# Patient Record
Sex: Male | Born: 1945
Health system: Southern US, Community
[De-identification: ages and names within clinical notes are randomized; demographics above are authoritative.]

## PROBLEM LIST (undated history)

## (undated) DIAGNOSIS — E119 Type 2 diabetes mellitus without complications: Secondary | ICD-10-CM

## (undated) DIAGNOSIS — E785 Hyperlipidemia, unspecified: Secondary | ICD-10-CM

## (undated) DIAGNOSIS — I10 Essential (primary) hypertension: Secondary | ICD-10-CM

## (undated) DIAGNOSIS — H3530 Unspecified macular degeneration: Secondary | ICD-10-CM

## (undated) DIAGNOSIS — I2510 Atherosclerotic heart disease of native coronary artery without angina pectoris: Secondary | ICD-10-CM

## (undated) DIAGNOSIS — M1990 Unspecified osteoarthritis, unspecified site: Secondary | ICD-10-CM

## (undated) DIAGNOSIS — Z87442 Personal history of urinary calculi: Secondary | ICD-10-CM

## (undated) DIAGNOSIS — K635 Polyp of colon: Secondary | ICD-10-CM

## (undated) DIAGNOSIS — N200 Calculus of kidney: Secondary | ICD-10-CM

## (undated) DIAGNOSIS — K5790 Diverticulosis of intestine, part unspecified, without perforation or abscess without bleeding: Secondary | ICD-10-CM

## (undated) HISTORY — PX: CORONARY ANGIOPLASTY WITH STENT PLACEMENT: SHX49

## (undated) HISTORY — DX: Calculus of kidney: N20.0

## (undated) HISTORY — DX: Diverticulosis of intestine, part unspecified, without perforation or abscess without bleeding: K57.90

## (undated) HISTORY — DX: Polyp of colon: K63.5

## (undated) HISTORY — DX: Essential (primary) hypertension: I10

## (undated) HISTORY — DX: Atherosclerotic heart disease of native coronary artery without angina pectoris: I25.10

## (undated) HISTORY — DX: Unspecified osteoarthritis, unspecified site: M19.90

## (undated) HISTORY — DX: Unspecified macular degeneration: H35.30

## (undated) HISTORY — DX: Hyperlipidemia, unspecified: E78.5

---

## 1998-10-18 ENCOUNTER — Other Ambulatory Visit: Admission: RE | Admit: 1998-10-18 | Discharge: 1998-10-18 | Payer: Self-pay | Admitting: Gastroenterology

## 2004-02-22 ENCOUNTER — Ambulatory Visit (HOSPITAL_COMMUNITY): Admission: AD | Admit: 2004-02-22 | Discharge: 2004-02-22 | Payer: Self-pay | Admitting: Urology

## 2004-03-21 ENCOUNTER — Encounter: Payer: Self-pay | Admitting: Gastroenterology

## 2005-06-19 ENCOUNTER — Inpatient Hospital Stay (HOSPITAL_BASED_OUTPATIENT_CLINIC_OR_DEPARTMENT_OTHER): Admission: RE | Admit: 2005-06-19 | Discharge: 2005-06-19 | Payer: Self-pay | Admitting: Cardiovascular Disease

## 2005-06-23 ENCOUNTER — Observation Stay (HOSPITAL_COMMUNITY): Admission: RE | Admit: 2005-06-23 | Discharge: 2005-06-24 | Payer: Self-pay | Admitting: Cardiovascular Disease

## 2009-02-16 ENCOUNTER — Encounter (INDEPENDENT_AMBULATORY_CARE_PROVIDER_SITE_OTHER): Payer: Self-pay | Admitting: *Deleted

## 2009-11-25 ENCOUNTER — Encounter (INDEPENDENT_AMBULATORY_CARE_PROVIDER_SITE_OTHER): Payer: Self-pay | Admitting: *Deleted

## 2009-12-21 ENCOUNTER — Encounter (INDEPENDENT_AMBULATORY_CARE_PROVIDER_SITE_OTHER): Payer: Self-pay | Admitting: *Deleted

## 2009-12-21 ENCOUNTER — Ambulatory Visit: Payer: Self-pay | Admitting: Gastroenterology

## 2009-12-21 DIAGNOSIS — E119 Type 2 diabetes mellitus without complications: Secondary | ICD-10-CM | POA: Insufficient documentation

## 2009-12-21 DIAGNOSIS — Z860100 Personal history of colon polyps, unspecified: Secondary | ICD-10-CM | POA: Insufficient documentation

## 2009-12-21 DIAGNOSIS — I2510 Atherosclerotic heart disease of native coronary artery without angina pectoris: Secondary | ICD-10-CM

## 2009-12-21 HISTORY — DX: Atherosclerotic heart disease of native coronary artery without angina pectoris: I25.10

## 2010-01-17 ENCOUNTER — Ambulatory Visit: Payer: Self-pay | Admitting: Gastroenterology

## 2010-01-20 ENCOUNTER — Encounter: Payer: Self-pay | Admitting: Gastroenterology

## 2010-01-26 ENCOUNTER — Observation Stay (HOSPITAL_COMMUNITY): Admission: AD | Admit: 2010-01-26 | Discharge: 2010-01-27 | Payer: Self-pay | Admitting: Internal Medicine

## 2010-01-26 ENCOUNTER — Telehealth: Payer: Self-pay | Admitting: Gastroenterology

## 2010-01-26 ENCOUNTER — Ambulatory Visit: Payer: Self-pay | Admitting: Gastroenterology

## 2010-01-26 DIAGNOSIS — K625 Hemorrhage of anus and rectum: Secondary | ICD-10-CM | POA: Insufficient documentation

## 2010-01-26 DIAGNOSIS — I2510 Atherosclerotic heart disease of native coronary artery without angina pectoris: Secondary | ICD-10-CM | POA: Insufficient documentation

## 2010-01-26 DIAGNOSIS — I10 Essential (primary) hypertension: Secondary | ICD-10-CM | POA: Insufficient documentation

## 2010-01-26 DIAGNOSIS — E785 Hyperlipidemia, unspecified: Secondary | ICD-10-CM | POA: Insufficient documentation

## 2010-01-26 LAB — CONVERTED CEMR LAB
Basophils Absolute: 0 10*3/uL (ref 0.0–0.1)
Eosinophils Absolute: 0.2 10*3/uL (ref 0.0–0.7)
HCT: 38.4 % — ABNORMAL LOW (ref 39.0–52.0)
Lymphs Abs: 2.2 10*3/uL (ref 0.7–4.0)
MCHC: 32.2 g/dL (ref 30.0–36.0)
Monocytes Absolute: 0.6 10*3/uL (ref 0.1–1.0)
Monocytes Relative: 5.3 % (ref 3.0–12.0)
Neutro Abs: 7.9 10*3/uL — ABNORMAL HIGH (ref 1.4–7.7)
Platelets: 242 10*3/uL (ref 150.0–400.0)
RDW: 12.6 % (ref 11.5–14.6)

## 2010-02-03 ENCOUNTER — Ambulatory Visit: Payer: Self-pay | Admitting: Gastroenterology

## 2010-02-03 LAB — CONVERTED CEMR LAB
Basophils Absolute: 0.1 10*3/uL (ref 0.0–0.1)
Eosinophils Relative: 1.5 % (ref 0.0–5.0)
HCT: 36.2 % — ABNORMAL LOW (ref 39.0–52.0)
Lymphocytes Relative: 24.4 % (ref 12.0–46.0)
Monocytes Relative: 7.9 % (ref 3.0–12.0)
Neutrophils Relative %: 65.5 % (ref 43.0–77.0)
Platelets: 262 10*3/uL (ref 150.0–400.0)
RDW: 12.6 % (ref 11.5–14.6)
WBC: 8.6 10*3/uL (ref 4.5–10.5)

## 2010-12-13 NOTE — Progress Notes (Signed)
Summary: BRB this morning  Phone Note Call from Patient Call back at [phone]   Caller: Patient Summary of Call: patient seen BRB in his stool this morning. No other complaints. No abd.  Initial call taken by: Harlow Mares CMA Duncan Dull),  January 26, 2010 10:37 AM  Follow-up for Phone Call        Pt states that he had bright red blood with stool this am.  Stool was normal although he had a sudden urge and could barely make it to the bathroom.  States has a large stool.  Pt went back to bath room an hr or so later and he noticed more bright blood although he did not have a bm.  Pt had a colonoscopy 9 days ago.  Pt is on Plavix which he restarted to day after colon.  (see report) Follow-up by: Ashok Cordia RN,  January 26, 2010 11:10 AM  Additional Follow-up for Phone Call Additional follow up Details #1::        Discussed pt with Amy Esterwood PA, per Amy, if no more bleeding today OK to just observe for now.  If cont's to pass blood will need CBC and OV tomorrow.  If bleeding becomes severe will need to go to ER.    Additional Follow-up by: Ashok Cordia RN,  January 26, 2010 2:53 PM    Additional Follow-up for Phone Call Additional follow up Details #2::    Talked with pt.  States he has passed blood twice since this am.  He describes it as alot of blood.  Both dark and bright red the second time.  Pt feels fine otherwise.  He has taken his plavix today. Follow-up by: Ashok Cordia RN,  January 26, 2010 2:56 PM  Additional Follow-up for Phone Call Additional follow up Details #3:: Details for Additional Follow-up Action Taken: Per Amy, pt need CBC and OV this pm.  Pt notified.  Coming now. Additional Follow-up by: Ashok Cordia RN,  January 26, 2010 3:18 PM

## 2010-12-13 NOTE — Procedures (Signed)
Summary: Colonoscopy  Patient: [facility] Note: All result statuses are Final unless otherwise noted.  Tests: (1) Colonoscopy (COL)   COL Colonoscopy           DONE     [facility]     [address]     [place], [place]  [address]           COLONOSCOPY PROCEDURE REPORT           PATIENT:  Jason G[facility], Jason Giles  MR#:  [id number]     BIRTHDATE:  [birth year], 63 yrs. old  GENDER:  male           ENDOSCOPIST:  Vania Rea. Jarold Motto, MD, [facility]     Referred by:           PROCEDURE DATE:  01/17/2010     PROCEDURE:  Colonoscopy with snare polypectomy     ASA CLASS:  Class III     INDICATIONS:  Routine Risk Screening           MEDICATIONS:   Fentanyl 50 mcg IV, Versed 7 mg IV           DESCRIPTION OF PROCEDURE:   After the risks benefits and     alternatives of the procedure were thoroughly explained, informed     consent was obtained.  Digital rectal exam was performed and     revealed no abnormalities.   The LB [id number] endoscope     was introduced through the anus and advanced to the cecum, which     was identified by both the appendix and ileocecal valve, limited     by a redundant colon.  VERY REDUNDANT RIGHT COLON.  The quality of     the prep was adequate, using MiraLax.  The instrument was then     slowly withdrawn as the colon was fully examined.     <<PROCEDUREIMAGES>>           FINDINGS:  ULTRASONIC FINDINGS:  A sessile polyp was found in the     cecum. CM. POLYP.BASE OF CECUM. Polyp was snared, then cauterized     with monopolar cautery. Retrieval was successful. snare polyp Mild     diverticulosis was found sigmoid to descending  This was otherwise     a normal examination of the colon. VERY REDUNDANT COLON FULL OF     PREP FLUID.   Retroflexed views in the rectum revealed no     abnormalities.    The scope was then withdrawn from the patient     and the procedure completed.           COMPLICATIONS:  None           ENDOSCOPIC IMPRESSION:     1) Sessile  polyp in the cecum     2) Mild diverticulosis in the sigmoid to descending     3) Otherwise normal examination     RECOMMENDATIONS:     1) If the polyp(s) removed today are proven to be adenomatous     (pre-cancerous) polyps, you will need a repeat colonoscopy in 5     years. Otherwise you should continue to follow colorectal cancer     screening guidelines for "routine risk" patients with colonoscopy     in 10 years.     HOLD PLAVIX FOR 1 WEEK.           REPEAT EXAM:  No           ______________________________  Vania Rea. Jarold Motto, MD, [facility]           CC:  Guerry Bruin, MDPhilip Nahser, MD           n.     Rosalie Doctor:   Vania Rea. Niajah Sipos at 01/17/2010 12:06 PM           Riki Sheer, [id number]  Note: An exclamation mark (!) indicates a result that was not dispersed into the flowsheet. Document Creation Date: 01/17/2010 12:07 PM _______________________________________________________________________  (1) Order result status: Final Collection or observation date-time: 01/17/2010 11:58 Requested date-time:  Receipt date-time:  Reported date-time:  Referring Physician:   Ordering Physician: Sheryn Bison [phone]) Specimen Source:  Source: Launa Grill Order Number: [id number] Lab site:   Appended Document: Colonoscopy     Procedures Next Due Date:    Colonoscopy: 01/2013

## 2010-12-13 NOTE — Assessment & Plan Note (Signed)
Summary: REC COL/PLAVIX.Marland KitchenMarland KitchenAS.   History of Present Illness Visit Type: Initial Visit Primary GI MD: Sheryn Bison MD FACP FAGA Primary Provider: Calton Golds, MD Chief Complaint: Colon screening, family hx History of Present Illness:   65 year old Caucasian male who is insulin-dependent who also takes aspirin and Plavix for previous coronary artery stenting. He has a family history of colon cancer his mother and he is due for his five-year colonoscopy exam denying any GI complaints at this time. He is on insulin, metformin, and Crestor. He is followed by Dr. Melburn Popper in cardiology and currently denies any pulmonary or cardiovascular problems.  Review of his chart does show one previous polyp removed. He currently is having regular bowel movements without melena, hematochezia, or any upper GI or hepatobiliary complaints. His appetite is good and his weight is stable. He relates a yearly Hemoccult cards have been guaiac-negative per his primary care physician Dr.Tisovec.   GI Review of Systems      Denies abdominal pain, acid reflux, belching, bloating, chest pain, dysphagia with liquids, dysphagia with solids, heartburn, loss of appetite, nausea, vomiting, vomiting blood, weight loss, and  weight gain.        Denies anal fissure, black tarry stools, change in bowel habit, constipation, diarrhea, diverticulosis, fecal incontinence, heme positive stool, hemorrhoids, irritable bowel syndrome, jaundice, light color stool, liver problems, rectal bleeding, and  rectal pain. Preventive Screening-Counseling & Management      Drug Use:  no.      Current Medications (verified): 1)  Novolog 100 Unit/ml Soln (Insulin Aspart) .... Inject 8-9 Units Two Times A Day 2)  Lantus 100 Unit/ml Soln (Insulin Glargine) .... Inject 45 Inits Two Times A Day 3)  Trilipix 135 Mg Cpdr (Choline Fenofibrate) .... Once Daily 4)  Toprol Xl 25 Mg Xr24h-Tab (Metoprolol Succinate) .... Take 2 Tablet By Mouth Daily 5)   Crestor 40 Mg Tabs (Rosuvastatin Calcium) .... Once Daily 6)  Metformin Hcl 850 Mg Tabs (Metformin Hcl) .... Take 1 Tablet By Mouth Two Times A Day 7)  Plavix 75 Mg Tabs (Clopidogrel Bisulfate) .... Once Daily 8)  Viagra 50 Mg Tabs (Sildenafil Citrate) .... As Needed 9)  Multivitamins  Tabs (Multiple Vitamin) .... Once Daily 10)  Aspir-Low 81 Mg Tbec (Aspirin) .... Once Daily  Allergies (verified): No Known Drug Allergies  Past History:  Past medical, surgical, family and social histories (including risk factors) reviewed for relevance to current acute and chronic problems.  Past Medical History: Diabetes Hyperlipidemia Arthritis Adenomatous Colon Polyps Coronary Artery Disease Kidney Stones  Past Surgical History: Knee Arthroscopy PTCA-Stent  Family History: Reviewed history from 12/20/2009 and no changes required. Family History of Colon Cancer: Mother Family History of COlon Polyps: Sister  Social History: Reviewed history from 12/20/2009 and no changes required. Patient has never smoked.  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  The patient denies allergy/sinus, anemia, anxiety-new, arthritis/joint pain, back pain, blood in urine, breast changes/lumps, change in vision, confusion, cough, coughing up blood, depression-new, fainting, fatigue, fever, headaches-new, heart murmur, heart rhythm changes, itching, menstrual pain, muscle pains/cramps, night sweats, nosebleeds, pregnancy symptoms, shortness of breath, skin rash, sleeping problems, sore throat, swelling of feet/legs, swollen lymph glands, thirst - excessive , urination - excessive , urination changes/pain, urine leakage, vision changes, and voice change.   General:  Denies fever, chills, sweats, anorexia,  fatigue, weakness, malaise, weight loss, and sleep disorder. Eyes:  Denies blurring,  diplopia, irritation, discharge, vision loss, scotoma, eye pain, and photophobia. ENT:  Complains of decreased hearing; denies earache, ear discharge, tinnitus, nasal congestion, loss of smell, nosebleeds, sore throat, hoarseness, and difficulty swallowing. CV:  Denies chest pains, angina, palpitations, syncope, dyspnea on exertion, orthopnea, PND, peripheral edema, and claudication. Resp:  Denies dyspnea at rest, dyspnea with exercise, cough, sputum, wheezing, coughing up blood, and pleurisy. GI:  Denies difficulty swallowing, pain on swallowing, nausea, indigestion/heartburn, vomiting, vomiting blood, abdominal pain, jaundice, gas/bloating, diarrhea, constipation, change in bowel habits, bloody BM's, black BMs, and fecal incontinence. GU:  Denies urinary burning, blood in urine, urinary frequency, urinary hesitancy, nocturnal urination, urinary incontinence, penile discharge, genital sores, decreased libido, and erectile dysfunction. MS:  Denies joint pain / LOM, joint swelling, joint stiffness, joint deformity, low back pain, muscle weakness, muscle cramps, muscle atrophy, leg pain at night, leg pain with exertion, and shoulder pain / LOM hand / wrist pain (CTS). Neuro:  Denies weakness, paralysis, abnormal sensation, seizures, syncope, tremors, vertigo, transient blindness, frequent falls, frequent headaches, difficulty walking, headache, sciatica, radiculopathy other:, restless legs, memory loss, and confusion. Psych:  Denies depression, anxiety, memory loss, suicidal ideation, hallucinations, paranoia, phobia, and confusion. Endo:  Denies cold intolerance, heat intolerance, polydipsia, polyphagia, polyuria, unusual weight change, and hirsutism; He denies any complications from his diabetes or any history of peripheral neuropathy, visual problems, or renal problems. He does have hyperlipidemia and is on Crestor and Trilipix.. Heme:  Denies bruising, bleeding, enlarged lymph nodes, and  pagophagia. Allergy:  Denies hives, rash, sneezing, hay fever, and recurrent infections.  Vital Signs:  Patient profile:   65 year old male Height:      71.5 inches Weight:      227.13 pounds BMI:     31.35 Pulse rate:   68 / minute Pulse rhythm:   irregular BP sitting:   126 / 72  (left arm) Cuff size:   regular  Vitals Entered By: June McMurray CMA Duncan Dull) (December 21, 2009 2:44 PM)  Physical Exam  General:  Well developed, well nourished, no acute distress.healthy appearing.   Head:  Normocephalic and atraumatic. Eyes:  PERRLA, no icterus.exam deferred to patient's ophthalmologist.   Neck:  Supple; no masses or thyromegaly. Lungs:  Clear throughout to auscultation. Heart:  Regular rate and rhythm; no murmurs, rubs,  or bruits. Abdomen:  Soft, nontender and nondistended. No masses, hepatosplenomegaly or hernias noted. Normal bowel sounds. Rectal:  deferred until time of colonoscopy.   Msk:  Symmetrical with no gross deformities. Normal posture. Pulses:  Normal pulses noted. Extremities:  No clubbing, cyanosis, edema or deformities noted. Neurologic:  Alert and  oriented x4;  grossly normal neurologically. Cervical Nodes:  No significant cervical adenopathy. Inguinal Nodes:  No significant inguinal adenopathy. Psych:  Alert and cooperative. Normal mood and affect.   Impression & Recommendations:  Problem # 1:  PERSONAL HX COLONIC POLYPS (ICD-V12.72) Assessment Unchanged  Colonoscopy scheduled his convenience. We will continue aspirin but hold Plavix 5 days before his procedure unless otherwise advised by cardiology. He should be able to tolerate a balanced electrolyte solution preparation as per his previous exams.  Orders: Colonoscopy (Colon)  Problem # 2:  ENCOUNTER FOR LONG-TERM USE OF ANTICOAGULANTS (ICD-V58.61) Assessment: Unchanged Hold Plavix That 5 days before procedure but continues salicylates.  Problem # 3:  CORONARY ATHEROSCLEROSIS NATIVE CORONARY ARTERY  (ICD-414.01) Assessment: Improved Continue multiple cardiac medications per cardiology and  Dr.Tisovec  Problem # 4:  DM (ICD-250.00) Assessment: New Appropriate adjustment in his insulin and diabetic medications will be made for his colonoscopy procedure. Otherwise he is to continue his medications as per primary care.  Patient Instructions: 1)  Copy sent to : Dr. Guerry Bruin and Dr. Laqueta Carina in cardiology 2)  Please continue current medications.  3)  Colonoscopy and Flexible Sigmoidoscopy brochure given.  4)  Conscious Sedation brochure given.  5)  Adjustments in diabetic medications for procedure and hold Plavix 5 days before colonoscopy less otherwise advised.   6)  The medication list was reviewed and reconciled.  All changed / newly prescribed medications were explained.  A complete medication list was provided to the patient / caregiver. Prescriptions: MOVIPREP 100 GM  SOLR (PEG-KCL-NACL-NASULF-NA ASC-C) As per prep instructions.  #1 x 0   Entered by:   Ashok Cordia RN   Authorized by:   Mardella Layman MD [facility]   Signed by:   Ashok Cordia RN on 12/21/2009   Method used:   Electronically to        [facility]* (retail)       [address]       [place], [place]  [address]       Ph: [phone]       Fax: [phone]   RxID:   [id number]

## 2010-12-13 NOTE — Letter (Signed)
Summary: New Patient letter  [facility]  [address] [place], [place] [address]   Phone: [phone]  Fax: [phone]       11/25/2009 MRN: [id number]  Jason Giles [address] [place], [place]  [address]  [place]  Dear Jason Giles,  Welcome to the Gastroenterology Division at [facility].    You are scheduled to see Dr.  Jarold Motto on 12/21/2009 at 2:45PM on the 3rd floor at [facility], [address]  We ask that you try to arrive at our office 15 minutes prior to your appointment time to allow for check-in.  We would like you to complete the enclosed self-administered evaluation form prior to your visit and bring it with you on the day of your appointment.  We will review it with you.  Also, please bring a complete list of all your medications or, if you prefer, bring the medication bottles and we will list them.  Please bring your insurance card so that we may make a copy of it.  If your insurance requires a referral to see a specialist, please bring your referral form from your primary care physician.  Co-payments are due at the time of your visit and may be paid by cash, check or credit card.     Your office visit will consist of a consult with your physician (includes a physical exam), any laboratory testing he/she may order, scheduling of any necessary diagnostic testing (e.g. x-ray, ultrasound, CT-scan), and scheduling of a procedure (e.g. Endoscopy, Colonoscopy) if required.  Please allow enough time on your schedule to allow for any/all of these possibilities.    If you cannot keep your appointment, please call [phone] to cancel or reschedule prior to your appointment date.  This allows [place] the opportunity to schedule an appointment for another patient in need of care.  If you do not cancel or reschedule by 5 p.m. the business day prior to your appointment date, you will be charged a $50.00 late cancellation/no-show fee.    Thank you for  choosing [facility] for your medical needs.  We appreciate the opportunity to care for you.  Please visit [place] at our website  to learn more about our practice.                     Sincerely,                                                             The Gastroenterology Division

## 2010-12-13 NOTE — Initial Assessments (Signed)
Summary: rectal bleeding,dfs   History of Present Illness Visit Type: Follow-up Visit Primary GI MD: Sheryn Bison MD FACP FAGA Primary Provider: Guerry Bruin, MD Chief Complaint: Rectal bleeding: 3 episodes today DRB in toilet, BRB in toilet x2  History of Present Illness:   65 YO MALE KNOWN TO DR. PATTERSON WHO UNDERWENT COLONOSCOPY ON 01/17/10 WITH REMOVAL OF A CECAL POLYP-SNARED THEN  CAUTERY. HE IS MAINTAINED ON PLAVIX WITH HX OF CAD, S/P STENT SEVERAL YEARS AGO. HE HELD HIS PLAVIX FOR 5 DAYS PREPROCEDURE THEN RESUMED IT. HE DID WELL UNTIL THIS MORNING WHEN HE HAD A BM WITH RED BLOOD. HE HAD A SECOND EPISODE LATER THIS MORNING WITH MORE BLOOD. HE CALLED HERE. HE HAD A THIRD EPISODE ON HIS WAY OVER HERE, AGAIN DARK RED BLOOD. HE FEELS A LITTLE WEAK AND LIGHTHEADED, NO ABDOMINAL PAIN, CRAMPING ETC. NO N/V.BP:98/50 IN OFFICE.   GI Review of Systems      Denies abdominal pain, acid reflux, belching, bloating, chest pain, dysphagia with liquids, dysphagia with solids, heartburn, loss of appetite, nausea, vomiting, vomiting blood, weight loss, and  weight gain.      Reports diarrhea and  rectal bleeding.     Denies anal fissure, black tarry stools, change in bowel habit, constipation, diverticulosis, fecal incontinence, heme positive stool, hemorrhoids, irritable bowel syndrome, jaundice, light color stool, liver problems, and  rectal pain.   Current Medications (verified): 1)  Novolog 100 Unit/ml Soln (Insulin Aspart) .... Inject 8-9 Units Two Times A Day 2)  Lantus 100 Unit/ml Soln (Insulin Glargine) .... Inject 45 Inits Two Times A Day 3)  Trilipix 135 Mg Cpdr (Choline Fenofibrate) .... Once Daily 4)  Toprol Xl 25 Mg Xr24h-Tab (Metoprolol Succinate) .... Take 2 Tablet By Mouth Daily 5)  Crestor 40 Mg Tabs (Rosuvastatin Calcium) .... Once Daily 6)  Metformin Hcl 850 Mg Tabs (Metformin Hcl) .... Take 1 Tablet By Mouth Two Times A Day 7)  Plavix 75 Mg Tabs (Clopidogrel Bisulfate) ....  Once Daily 8)  Viagra 50 Mg Tabs (Sildenafil Citrate) .... As Needed 9)  Multivitamins  Tabs (Multiple Vitamin) .... Once Daily 10)  Aspir-Low 81 Mg Tbec (Aspirin) .... Once Daily  Allergies (verified): No Known Drug Allergies  Past History:  Past Medical History: Diabetes-INSULIN DEPENDENT Hyperlipidemia Arthritis Adenomatous Colon Polyps/LAST COLON 01/17/10 Coronary Artery Disease S/P STENTING Kidney Stones  Past Surgical History: Reviewed history from 12/21/2009 and no changes required. Knee Arthroscopy PTCA-Stent  Family History: Reviewed history from 12/20/2009 and no changes required. Family History of Colon Cancer: Mother Family History of COlon Polyps: Sister  Social History: Reviewed history from 12/21/2009 and no changes required. Patient has never smoked. 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  The patient denies allergy/sinus, anemia, anxiety-new, arthritis/joint pain, back pain, blood in urine, breast changes/lumps, change in vision, confusion, cough, coughing up blood, depression-new, fainting, fatigue, fever, headaches-new, heart murmur, heart rhythm changes, itching, menstrual pain, muscle pains/cramps, night sweats, nosebleeds, pregnancy symptoms, shortness of breath, skin rash, sleeping problems, sore throat, swelling of feet/legs, swollen lymph glands, thirst - excessive , urination - excessive , urination changes/pain, urine leakage, vision changes, and voice change.         ROS NEGATIVE EXCEPT AS DESCRIBED ABOVE  Vital Signs:  Patient profile:   65 year old male Height:      71 inches Weight:  228 pounds BMI:     31.91 Pulse rate:   52 / minute Pulse rhythm:   irregular BP sitting:   98 / 64  (left arm) Cuff size:   regular  Vitals Entered By: June McMurray CMA Duncan Dull) (January 26, 2010 3:43 PM)  Physical Exam  General:  Well  developed, well nourished, no acute distress. Head:  Normocephalic and atraumatic. Eyes:  PERRLA, no icterus. Lungs:  Clear throughout to auscultation. Heart:  Regular rate and rhythm; no murmurs, rubs,  or bruits. Abdomen:  SOFT, NONTENDER, NO MASS OR HSM,BS+ Rectal:  DARK RED BLOOD IN RECTAL VAULT Extremities:  No clubbing, cyanosis, edema or deformities noted. Neurologic:  Alert and  oriented x4;  grossly normal neurologically. Psych:  Alert and cooperative. Normal mood and affect.  Impression & Recommendations:  Problem # 1:  RECTAL BLEEDING (ICD-569.3) Assessment New 65 YO MALE WITH PROBABLE POST POLYPECTOMY HEMORRHAGE;9 DAYS S/P COLONOSCOPY. AGGRAVATED BY ANTICOAGULATION. ADMIT TO [facility] BOWEL REST, BED REST, CLEAR LIQUIDS SERIAL H/H, TRANSFUSE AS INDICATED WILL HOLD PLAVIX HOLD BP MEDS TONIGHT SECONDARY TO MILD HYPOTENSION SEE ORDERS.  Problem # 2:  CORONARY ARTERY DISEASE (ICD-414.00) Assessment: Comment Only S/P STENT-REMOTE  Problem # 3:  FAMILY HX COLON CANCER (ICD-V16.0) Assessment: Comment Only  Problem # 4:  PERSONAL HX COLONIC POLYPS (ICD-V12.72) Assessment: Comment Only ADENOMATOUS  Problem # 5:  DM (ICD-250.00) Assessment: Comment Only INSULIN DEPENDENT  Problem # 6:  HYPERLIPIDEMIA (ICD-272.4) Assessment: Comment Only  Problem # 7:  HYPERTENSION (ICD-401.9) Assessment: Comment Only HYPOTENSIVE TODAY SECONDARY TO ACUTE BLEED.  Patient Instructions: 1)  Admit to [facility] 2)  Copy sent to :  Sheryn Bison, MD 3)                        Guerry Bruin, MD

## 2010-12-13 NOTE — Letter (Signed)
Summary: Patient Notice- Polyp Results  [facility]  [address] [place], [place] [address]   Phone: [phone]  Fax: [phone]        January 20, 2010 MRN: [id number]    [facility] [address] [place], [place]  [address]    Dear Mr. Jason Giles,  I am pleased to inform you that the colon polyp(s) removed during your recent colonoscopy was (were) found to be benign (no cancer detected) upon pathologic examination.  I recommend you have a repeat colonoscopy examination in 3_ years to look for recurrent polyps, as having colon polyps increases your risk for having recurrent polyps or even colon cancer in the future.I feel 3 years exam here is in order.  Should you develop new or worsening symptoms of abdominal pain, bowel habit changes or bleeding from the rectum or bowels, please schedule an evaluation with either your primary care physician or with me.  Additional information/recommendations:  _x_ No further action with gastroenterology is needed at this time. Please      follow-up with your primary care physician for your other healthcare      needs.  __ Please call [phone] to schedule a return visit to review your      situation.  __ Please keep your follow-up visit as already scheduled.  __ Continue treatment plan as outlined the day of your exam.  Please call us if you are having persistent problems or have questions about your condition that have not been fully answered at this time.  Sincerely,  Mardella Layman MD [facility]  This letter has been electronically signed by your physician.  Appended Document: Patient Notice- Polyp Results Letter mailed 3.15.11

## 2010-12-13 NOTE — Letter (Signed)
Summary: Diabetic Instructions  [facility]  [address] [place], [place] [address]   Phone: [phone]  Fax: [phone]    Jason Giles [birth year] MRN: [id number]   _X  _   ORAL DIABETIC MEDICATION INSTRUCTIONS  The day before your procedure:   Take your diabetic pill as you do normally  The day of your procedure:   Do not take your diabetic pill    We will check your blood sugar levels during the admission process and again in Recovery before discharging you home  ________________________________________________________________________  _ X _   INSULIN (LONG ACTING) MEDICATION INSTRUCTIONS (Lantus, NPH, 70/30, Humulin, Novolin-N)   The day before your procedure:   Take  your regular evening dose    The day of your procedure:   Do not take your morning dose    _  _   INSULIN (SHORT ACTING) MEDICATION INSTRUCTIONS (Regular, Humulog, Novolog)   The day before your procedure:   Do not take your evening dose   The day of your procedure:   Do not take your morning dose   _  _   INSULIN PUMP MEDICATION INSTRUCTIONS  We will contact the physician managing your diabetic care for written dosage instructions for the day before your procedure and the day of your procedure.  Once we have received the instructions, we will contact you.

## 2010-12-13 NOTE — Assessment & Plan Note (Signed)
Summary: post hospital, bleeding post polypectomy/lk   History of Present Illness Visit Type: Follow-up Visit Primary GI MD: Sheryn Bison MD FACP FAGA Primary Provider: Guerry Bruin, MD Requesting Provider: n/a Chief Complaint: Post hosp f/u for bleeding post polypectomy. Pt states he feels better but will have nausea at times and denies any other GI complaints  History of Present Illness:   Jason Giles had a delayed post-polypectomy bleeding episode requiring hospitalization but no transfusion. He had been on Plavix which was held 5 days before his procedure at one week after his procedure. He did continue salicylates throughout his colonoscopy prep and procedure and postop.  His hemoglobin on discharge was 10.5 and repeat today is 12.3. He is asymptomatic except for some slight queasiness but is not having melena or abdominal pain. He is back on Plavix and aspirin at this time. Patient is diabetic and is on insulin and multiple cardiac medications and oral metformin.  Head, colonoscopy and a prominent polyp in the cecum that was removed with electrocautery techniques. Review of pathology shows a villous adenomatous polyp. He will be scheduled for followup in 3 years time.   GI Review of Systems    Reports nausea.      Denies abdominal pain, acid reflux, belching, bloating, chest pain, dysphagia with liquids, dysphagia with solids, heartburn, loss of appetite, vomiting, vomiting blood, weight loss, and  weight gain.        Denies anal fissure, black tarry stools, change in bowel habit, constipation, diarrhea, diverticulosis, fecal incontinence, heme positive stool, hemorrhoids, irritable bowel syndrome, jaundice, light color stool, liver problems, rectal bleeding, and  rectal pain.    Current Medications (verified): 1)  Novolog 100 Unit/ml Soln (Insulin Aspart) .... Inject 8-9 Units Two Times A Day 2)  Lantus 100 Unit/ml Soln (Insulin Glargine) .... Inject 45 Inits Two Times A  Day 3)  Trilipix 135 Mg Cpdr (Choline Fenofibrate) .... Once Daily 4)  Toprol Xl 25 Mg Xr24h-Tab (Metoprolol Succinate) .... Take 2 Tablet By Mouth Daily 5)  Crestor 40 Mg Tabs (Rosuvastatin Calcium) .... Once Daily 6)  Metformin Hcl 850 Mg Tabs (Metformin Hcl) .... Take 1 Tablet By Mouth Two Times A Day 7)  Plavix 75 Mg Tabs (Clopidogrel Bisulfate) .... Once Daily 8)  Viagra 50 Mg Tabs (Sildenafil Citrate) .... As Needed 9)  Multivitamins  Tabs (Multiple Vitamin) .... Once Daily 10)  Aspir-Low 81 Mg Tbec (Aspirin) .... Once Daily  Allergies (verified): No Known Drug Allergies  Past History:  Past medical, surgical, family and social histories (including risk factors) reviewed for relevance to current acute and chronic problems.  Past Medical History: Reviewed history from 01/26/2010 and no changes required. 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 Knee Arthroscopy PTCA-Stent  Family History: Reviewed history from 12/20/2009 and no changes required. Family History of Colon Cancer: Mother Family History of Colon Polyps: Sister  Social History: Reviewed history from 01/26/2010 and no changes required. Patient has never smoked.  Alcohol Use - no Occupation: Sales Married  Daily Caffeine Use Illicit Drug Use - no  Review of Systems       The patient complains of arthritis/joint pain.    Vital Signs:  Patient profile:   65 year old male Height:      71 inches Weight:      227 pounds BMI:     31.77 BSA:  2.23 Pulse rate:   60 / minute Pulse rhythm:   regular BP sitting:   100 / 62  (left arm) Cuff size:   regular  Vitals Entered By: Ok Anis CMA (February 03, 2010 10:52 AM)  Physical Exam  General:  Well developed, well nourished, no acute distress.healthy appearing.    Head:  Normocephalic and atraumatic. Eyes:  PERRLA, no icterus.exam deferred to patient's ophthalmologist.   Lungs:  Clear throughout to auscultation. Heart:  Regular rate and rhythm; no murmurs, rubs,  or bruits. Abdomen:  Soft, nontender and nondistended. No masses, hepatosplenomegaly or hernias noted. Normal bowel sounds. Psych:  Alert and cooperative. Normal mood and affect.   Impression & Recommendations:  Problem # 1:  RECTAL BLEEDING (ICD-569.3) Assessment Improved His delayed post-polypectomy bleeding while on aspirin and Plavix seems to have resolved. His hemoglobin and hematocrit are stable and he denies any rectal bleeding or melena. He will need followup colonoscopy in 3 years time.  Problem # 2:  HYPERTENSION (ICD-401.9) Assessment: Improved Blood pressure today is 100/62 and pulse was 60 and regular. I've asked him to continue his cardiac medications but to exercise restraint in terms of physical activity and exertion. Patient also is an insulin-dependent diabetic and may need to followup with primary care should his queasiness continued.  Patient Instructions: 1)  Report back if you have any problems. 2)  Please continue current medications.  3)  The medication list was reviewed and reconciled.  All changed / newly prescribed medications were explained.  A complete medication list was provided to the patient / caregiver. 4)  Copy sent to : Dr. Laqueta Carina in cardiology and Dr. Guerry Bruin at [facility].

## 2010-12-13 NOTE — Procedures (Signed)
Summary: Colon   Colonoscopy  Procedure date:  03/21/2004  Findings:      Location:  [facility].   Patient Name: Jason Giles, Jason Giles. MRN:  Procedure Procedures: Colonoscopy CPT: [phone].  Personnel: Endoscopist: Vania Rea. Jarold Motto, MD.  Exam Location: Exam performed in Outpatient Clinic. Outpatient  Patient Consent: Procedure, Alternatives, Risks and Benefits discussed, consent obtained, from patient. Consent was obtained by the RN.  Indications  Surveillance of: Adenomatous Polyp(s).  Increased Risk Screening: For family history of colorectal neoplasia, in  parent  History  Current Medications: Patient is not currently taking Coumadin.  Pre-Exam Physical: Performed Mar 21, 2004. Cardio-pulmonary exam, Rectal exam, Abdominal exam, Extremity exam, Mental status exam WNL.  Exam Exam: Extent of exam reached: Cecum, extent intended: Cecum.  The cecum was identified by appendiceal orifice and IC valve. Patient position: on left side. Duration of exam: 20 minutes. Colon retroflexion performed. Images taken. ASA Classification: I. Tolerance: excellent.  Monitoring: Pulse and BP monitoring, Oximetry used. Supplemental O2 given. at 2 Liters.  Colon Prep Used Golytely for colon prep. Prep results: excellent.  Sedation Meds: Patient assessed and found to be appropriate for moderate (conscious) sedation. Fentanyl 75 mcg. given IV. Versed 3 mg. given IV.  Instrument(s): CF 140L. Serial [id number].  Findings - NORMAL EXAM: Cecum to Rectum. Not Seen: Polyps. AVM's. Colitis. Tumors. Crohn's. Diverticulosis. Hemorrhoids. Comments: Could not see the cecal tip. .   Assessment Normal examination.  Events  Unplanned Interventions: No intervention was required.  Plans Medication Plan: Continue current medications.  Patient Education: Patient given standard instructions for: Polyps. Patient instructed to get routine colonoscopy every 5 years.   Disposition: After procedure patient sent to recovery. After recovery patient sent home.  Scheduling/Referral: Follow-Up prn.   cc: Guerry Bruin, MD  This report was created from the original endoscopy report, which was reviewed and signed by the above listed endoscopist.

## 2010-12-13 NOTE — Letter (Signed)
Summary: [facility] Instructions  [facility]  [address] [place], [place] [address]   Phone: [phone]  Fax: [phone]       HAADI SANTELLAN    1946/10/13    MRN: [id number]        Procedure Day /Date: Monday,  01/17/10     Arrival Time: 10:00      Procedure Time: 11:00     Location of Procedure:                    [facility] (4th Floor)   PREPARATION FOR COLONOSCOPY WITH MOVIPREP   Starting 5 days prior to your procedure 01/12/10  do not eat nuts, seeds, popcorn, corn, beans, peas,  salads, or any raw vegetables.  Do not take any fiber supplements (e.g. Metamucil, Citrucel, and Benefiber).  THE DAY BEFORE YOUR PROCEDURE         DATE: 01/16/10   DAY: Sunday  1.  Drink clear liquids the entire day-NO SOLID FOOD  2.  Do not drink anything colored red or purple.  Avoid juices with pulp.  No orange juice.  3.  Drink at least 64 oz. (8 glasses) of fluid/clear liquids during the day to prevent dehydration and help the prep work efficiently.  CLEAR LIQUIDS INCLUDE: Water Jello Ice Popsicles Tea (sugar ok, no milk/cream) Powdered fruit flavored drinks Coffee (sugar ok, no milk/cream) Gatorade Juice: apple, white grape, white cranberry  Lemonade Clear bullion, consomm, broth Carbonated beverages (any kind) Strained chicken noodle soup Hard Candy                             4.  In the morning, mix first dose of MoviPrep solution:    Empty 1 Pouch A and 1 Pouch B into the disposable container    Add lukewarm drinking water to the top line of the container. Mix to dissolve    Refrigerate (mixed solution should be used within 24 hrs)  5.  Begin drinking the prep at 5:00 p.m. The MoviPrep container is divided by 4 marks.   Every 15 minutes drink the solution down to the next mark (approximately 8 oz) until the full liter is complete.   6.  Follow completed prep with 16 oz of clear liquid of your choice (Nothing red or purple).  Continue  to drink clear liquids until bedtime.  7.  Before going to bed, mix second dose of MoviPrep solution:    Empty 1 Pouch A and 1 Pouch B into the disposable container    Add lukewarm drinking water to the top line of the container. Mix to dissolve    Refrigerate  THE DAY OF YOUR PROCEDURE      DATE: 01/17/10  DAY: Monday  Beginning at 6:00 a.m. (5 hours before procedure):         1. Every 15 minutes, drink the solution down to the next mark (approx 8 oz) until the full liter is complete.  2. Follow completed prep with 16 oz. of clear liquid of your choice.    3. You may drink clear liquids until 9:00  (2 HOURS BEFORE PROCEDURE).   MEDICATION INSTRUCTIONS  Unless otherwise instructed, you should take regular prescription medications with a small sip of water   as early as possible the morning of your procedure.  Diabetic patients - see separate instructions.  Stop taking Plavix on 01/12/10  OTHER INSTRUCTIONS  You will need a responsible adult at least 66 years of age to accompany you and drive you home.   This person must remain in the waiting room during your procedure.  Wear loose fitting clothing that is easily removed.  Leave jewelry and other valuables at home.  However, you may wish to bring a book to read or  an iPod/MP3 player to listen to music as you wait for your procedure to start.  Remove all body piercing jewelry and leave at home.  Total time from sign-in until discharge is approximately 2-3 hours.  You should go home directly after your procedure and rest.  You can resume normal activities the  day after your procedure.  The day of your procedure you should not:   Drive   Make legal decisions   Operate machinery   Drink alcohol   Return to work  You will receive specific instructions about eating, activities and medications before you leave.    The above instructions have been reviewed and explained to me by    _______________________    I fully understand and can verbalize these instructions _____________________________ Date _________

## 2011-01-02 ENCOUNTER — Ambulatory Visit (INDEPENDENT_AMBULATORY_CARE_PROVIDER_SITE_OTHER): Payer: BC Managed Care – PPO | Admitting: Cardiovascular Disease

## 2011-01-02 DIAGNOSIS — I119 Hypertensive heart disease without heart failure: Secondary | ICD-10-CM

## 2011-01-02 DIAGNOSIS — E119 Type 2 diabetes mellitus without complications: Secondary | ICD-10-CM

## 2011-02-06 LAB — CROSSMATCH: ABO/RH(D): A POS

## 2011-02-06 LAB — HEMOGLOBIN AND HEMATOCRIT, BLOOD
HCT: 31.8 % — ABNORMAL LOW (ref 39.0–52.0)
HCT: 35.5 % — ABNORMAL LOW (ref 39.0–52.0)
Hemoglobin: 10.5 g/dL — ABNORMAL LOW (ref 13.0–17.0)
Hemoglobin: 10.6 g/dL — ABNORMAL LOW (ref 13.0–17.0)

## 2011-02-06 LAB — GLUCOSE, CAPILLARY
Glucose-Capillary: 129 mg/dL — ABNORMAL HIGH (ref 70–99)
Glucose-Capillary: 260 mg/dL — ABNORMAL HIGH (ref 70–99)
Glucose-Capillary: 277 mg/dL — ABNORMAL HIGH (ref 70–99)
Glucose-Capillary: 286 mg/dL — ABNORMAL HIGH (ref 70–99)

## 2011-02-06 LAB — BASIC METABOLIC PANEL
BUN: 29 mg/dL — ABNORMAL HIGH (ref 6–23)
CO2: 23 mEq/L (ref 19–32)
Calcium: 8.8 mg/dL (ref 8.4–10.5)
GFR calc non Af Amer: 39 mL/min — ABNORMAL LOW (ref >60)
Glucose, Bld: 222 mg/dL — ABNORMAL HIGH (ref 70–99)
Sodium: 137 mEq/L (ref 135–145)

## 2011-03-31 ENCOUNTER — Encounter: Payer: Self-pay | Admitting: *Deleted

## 2011-03-31 NOTE — H&P (Signed)
NAME:  Jason, Giles NO.:  [id number]   MEDICAL RECORD NO.:  [id number]          PATIENT TYPE:  AMB   LOCATION:                               FACILITY:  [facility]   PHYSICIAN:  Vesta Mixer, M.D. DATE OF BIRTH:  December 19, 1945   DATE OF ADMISSION:  DATE OF DISCHARGE:                                HISTORY & PHYSICAL   Jason Giles is a middle-aged gentleman with recent onset of chest pain.  He is referred for heart catheterization after having an abnormal stress  Cardiolite study.   Jason Giles is a 65 year old gentleman with a history of diabetes mellitus,  hypercholesterolemia, hypertension, and kidney stones.  He had recent onset  of chest pain.  He was sent to our office for a stress Cardiolite study.  He  had some premature ventricular contractions and couplets with exercise and  then ended up having a lateral defect on Jason Cardiolite scan.  He has not  had any further episodes of chest pain.   The chest pain was described as a chest pressure.  It occurred at the [facility]  while he was walking.  It was in the center of Jason chest.  It did not  radiate and was not associated with any dyspnea.  Jason Giles thinks that he  told her about some nausea he was having at that time.  He stopped walking,  and the pain resolved over several minutes.  It has not recurred since that  time.  He denies any episodes of syncope or presyncope. He denies any  nausea, vomiting, or diaphoresis.   CURRENT MEDICATIONS:  1.  TriCor 145 mg a day.  2.  Biretta 10 mg p.o. b.i.d.  3.  NovoLog 5 units twice a day.  4.  Lantus insulin 45 units twice a day.  5.  Toprol XL 25 mg a day.  6.  Zocor 80 mg a day.  7.  Metformin 850 mg twice a day.  8.  Multivitamins once a day.  9.  Aspirin 81 mg a day.   ALLERGIES:  None.   PAST MEDICAL HISTORY:  1.  Diabetes mellitus.  2.  Hypercholesterolemia.  3.  Hypertension.  4.  History of kidney stones.   SOCIAL HISTORY:  The patient smoked  for 20 years but quit around 20 years  ago.  He works as a Set designer.  He drinks beer  several times a week.  He works out at [employer] two to three times a week.   FAMILY HISTORY:  Jason father died at age 57 due to a brain tumor.  Jason mother  died at age 4 due to colon cancer.   REVIEW OF SYSTEMS:  Was reviewed and is essentially negative.   PHYSICAL EXAMINATION:  GENERAL:  He is a middle-aged gentleman in no acute  distress.  He is alert and oriented x 3, and Jason mood and affect are normal.  VITAL SIGNS: Weight 230.  Blood pressure 120/82 with heart rate of 100.  HEENT:  Exam reveals 2+ carotids.  No bruits, no JVD,  no thyromegaly.  LUNGS: Clear to auscultation.  HEART:  Regular rate, S1, S2.  He has no murmurs, gallops, or rubs.  ABDOMEN:  Exam reveals bowel sounds and is nontender.  EXTREMITIES:  No clubbing, cyanosis, or edema.  NEUROLOGIC:  Exam was nonfocal.   Stress Cardiolite study reveals a lateral wall defect.   [name] presents with an episode of chest pain and an abnormal stress  Cardiolite study.  He has numerous risk factors including dyslipidemia,  hypertension, and diabetes mellitus.  We have discussed the risks, benefits,  and options regarding heart catheterization.  He understands and agrees to  proceed.           ______________________________  Vesta Mixer, M.D.     PJN/MEDQ  D:  06/13/2005  T:  06/13/2005  Job:  [id number]   cc:   Gaspar Garbe, M.D.  [address]  [place]  [place] [address]  Fax: [phone]

## 2011-03-31 NOTE — Cardiovascular Report (Signed)
NAME:  Jason Giles, Jason Giles NO.:  [id number]   MEDICAL RECORD NO.:  [id number]          PATIENT TYPE:  OBV   LOCATION:  6529                         FACILITY:  [facility]   PHYSICIAN:  Vesta Mixer, M.D. DATE OF BIRTH:  10/08/1946   DATE OF PROCEDURE:  06/23/2005  DATE OF DISCHARGE:                              CARDIAC CATHETERIZATION   HISTORY:  [name] is a 65 year old gentleman with a recent episode of  chest pain. He had a stress Cardiolite study that revealed a lateral defect.  He had a heart catheterization in the outpatient lab which revealed a tight  circumflex marginal vessel. He returns today for PTCA and stenting of that  vessel.   The right femoral artery was easily cannulated using a modified Seldinger  technique. A 7-French sheath was positioned in the right femoral artery. The  left main was cannulated using a Judkins left four 7-French guide. Heparin  50-200 units and double bolus Integrilin drip were given. A Trooper  guidewire was positioned down to the distal circumflex artery.   The guiding angiography revealed a 99% stenosis at the takeoff of the first  obtuse marginal artery extending into the first OM. A 2.5 x 15 mm Quantum  Maverick was loaded up but would not cross into the obtuse marginal artery.  This balloon was removed and a 1.5 x 15 mm Voyager was used. This crossed  the lesion fairly easily. It was inflated twice up to 10 atmospheres. This  balloon was removed and the 2.5 mm x 15 mm Quantum Maverick was repositioned  back in the vessel of the circumflex OM. It was inflated up to 10  atmospheres for 23 seconds followed by 12 atmospheres for 28 seconds. This  opened the vessel adequately to allow passage of the stent. At this point  this balloon was removed and a 2.5 x 16 mm Taxus stent was positioned in the  circumflex marginal vessel. It was deployed at 14 atmospheres for 30  seconds. This resulted in a very nice angiographic result.  There was perhaps  some slight under deployment in the middle of the vessel. At this point, the  stent balloon was removed and a 2.75 x 12 mm Quantum Maverick was positioned  in the very middle of the stent. It was inflated up to 12 atmospheres for 30  seconds. This resulted in a very nice angiographic result with no residual  stenoses. There was no edge dissection. The patient was stable leaving the  lab.   COMPLICATIONS:  None.   CONCLUSION:  1.  Successful percutaneous transluminal coronary angioplasty and stenting      of the circumflex obtuse marginal      vessel.  2.  He has mild to moderate disease involving the left anterior descending      artery. We will continue to treat him medically for his mild to moderate      irregularities.       PJN/MEDQ  D:  06/23/2005  T:  06/24/2005  Job:  [id number]   cc:   Gaspar Garbe, M.D.  [address]  [address]  [place]  [place] [address]  Fax: [phone]

## 2011-03-31 NOTE — Discharge Summary (Signed)
NAME:  Jason Giles, Jason Giles NO.:  [id number]   MEDICAL RECORD NO.:  [id number]          PATIENT TYPE:  OBV   LOCATION:  6529                         FACILITY:  [facility]   PHYSICIAN:  Vesta Mixer, M.D. DATE OF BIRTH:  [birth year]   DATE OF ADMISSION:  06/23/2005  DATE OF DISCHARGE:  06/24/2005                                 DISCHARGE SUMMARY   DISCHARGE DIAGNOSES:  1.  Coronary artery disease.  2.  Diabetes mellitus.  3.  Hyperlipidemia.   DISCHARGE MEDICATIONS:  1.  Aspirin 325 mg a day.  2.  Plavix 75 mg a day.  3.  Crestor 10 mg a day.  4.  Tricor 145 mg a day.  5.  Toprol XL 25 mg a day.  6.  __________  10 mg twice a day.  7.  Lantus insulin 45 units twice a day.  8.  NovoLog 5 units twice a day.  9.  Metformin 850 mg twice a day, starting on Tuesday.   DISPOSITION:  1.  The patient will see Dr. Elease Hashimoto in 1-2 weeks.  2.  He is to see Dr. Wylene Simmer at his scheduled appointment in September.   HISTORY:  1.  [name] is a 65 year old gentleman with a recent abnormal stress      Cardiolite study.  He had a diagnostic heart catheterization which      revealed a 95% occlusion of his circumflex marginal branch.  He was      admitted for PTCA and stenting of this branch.  The patient underwent heart catheterization and was found to have a 95-99%  first OM stenosis.  He underwent successful PTCA and stenting of this vessel  using a 2.5 x 16-mm Taxus stent, post dilated with a 2.75 x 12-mm Quantum  Monorail.  He tolerated the procedure quite well.  He did not have any  complications.  His cardiac enzymes and CBC and B-MET were normal the day  following the procedure.  He is discharged in satisfactory condition.  We will have him start a regular walking program.  1.  Hyperlipidemia.  The patient had been on high-dose Zocor as well as      Tricor.  Because of the possible      interaction between these two medications, he was changed to Crestor 10      mg a  day.  We will give him some samples from the office and we will      follow up with a fasting lipid profile in the next 3 months.  2.  All of his other medical problems are stable.       PJN/MEDQ  D:  06/24/2005  T:  06/24/2005  Job:  [id number]   cc:   Gaspar Garbe, M.D.  [address]  [place]  [place] [address]  Fax: [phone]

## 2011-03-31 NOTE — Cardiovascular Report (Signed)
NAME:  TYSEN, ROESLER NO.:  [id number]   MEDICAL RECORD NO.:  [id number]          PATIENT TYPE:  OIB   LOCATION:  6501                         FACILITY:  [facility]   PHYSICIAN:  Vesta Mixer, M.D. DATE OF BIRTH:  [birth year]   DATE OF PROCEDURE:  06/19/2005  DATE OF DISCHARGE:                              CARDIAC CATHETERIZATION   Mr. Marrs is a 65 year old gentleman with a history of diabetes mellitus,  hypercholesterolemia, and hypertension.  He recently presented with some  episodes of chest pain.  He had a stress Cardiolite study which revealed a  lateral defect.  He is referred for heart catheterization for further  evaluation.   PROCEDURE:  Left heart catheterization with coronary angiography.   The right femoral artery was easily cannulated using the modified Seldinger  technique.   HEMODYNAMIC RESULTS:  The LV pressure was 158/14 with an aortic pressure of  154/76.   ANGIOGRAPHY:  Left main:  The left main is moderately calcified.  There are  minor luminal irregularities.   The left anterior descending artery is moderately to heavily calcified.  There are mild to moderate irregularities in the LAD representing  approximately 30% stenosis proximally.  The remainder of the LAD has only  minor luminal irregularities.  The first diagonal artery has a 60-70%  stenosis at its origin.  The remaining diagonal vessels have minor luminal  irregularities including a very large second diagonal branch.   The ramus intermediate branch has mild luminal irregularities.   The left circumflex artery is a large branch.  There is a 90% tortuous  stenosis right at the origin of the first obtuse marginal branch.   The right coronary artery has minor luminal irregularities.  It is dominant.  The posterior descending artery and the posterolateral segment artery have  mild to moderate irregularities.   The left ventriculogram was performed in the 30 RAO position.  It  reveals  overall normal left ventricular systolic function.  The ejection fraction is  50-55%.  There is no significant mitral regurgitation.   COMPLICATIONS:  None.   CONCLUSIONS:  Coronary artery disease involving the left circumflex/obtuse  marginal artery and the first diagonal vessel.  We will refer him to the  main catheterization laboratory for angioplasty in several days.  We will  plan on angioplasty of the circumflex artery.  I think that we will try to  treat the first diagonal stenosis medically.  It  is in a bifurcation location and is not really amenable to stenting.  We may  try to do a plain balloon angioplasty if it looks tighter at the time of  angioplasty.  We will continue with his current medical therapy.  He is  scheduled for angioplasty on Friday, August 11.       PJN/MEDQ  D:  06/19/2005  T:  06/19/2005  Job:  [id number]   cc:   Gaspar Garbe, M.D.  [address]  [place]  [place] [address]  Fax: [phone]

## 2011-04-04 ENCOUNTER — Encounter: Payer: Self-pay | Admitting: Cardiovascular Disease

## 2011-04-04 ENCOUNTER — Encounter: Payer: Self-pay | Admitting: *Deleted

## 2011-04-04 ENCOUNTER — Ambulatory Visit (INDEPENDENT_AMBULATORY_CARE_PROVIDER_SITE_OTHER): Payer: BC Managed Care – PPO | Admitting: Cardiovascular Disease

## 2011-04-04 VITALS — BP 122/78 | HR 76 | Ht 71.0 in | Wt 233.6 lb

## 2011-04-04 DIAGNOSIS — I2510 Atherosclerotic heart disease of native coronary artery without angina pectoris: Secondary | ICD-10-CM

## 2011-04-04 NOTE — Assessment & Plan Note (Signed)
Jason Giles is doing very well from a cardiac standpoint. He has not had any episodes of angina. We'll continue with his same medications. I'll see him again in 6 months for office visit, lipid profile, hepatic profile, and basic metabolic profile.

## 2011-04-04 NOTE — Progress Notes (Signed)
Jason Giles Date of Birth  05/02/1946 [facility] [address]     [address] [place], [place]  [address] [phone]  Fax  [phone]  History of Present Illness:  Pt is doing well.  Has retired since last visit. No chest pain .  He denies any angina or dyspnea.  Current Outpatient Prescriptions on File Prior to Visit  Medication Sig Dispense Refill  . aspirin 81 MG tablet Take 81 mg by mouth daily.        . metoprolol succinate (TOPROL-XL) 25 MG 24 hr tablet Take 25 mg by mouth 2 (two) times daily.        . multivitamin (THERAGRAN) per tablet Take 1 tablet by mouth daily.        . rosuvastatin (CRESTOR) 20 MG tablet Take 20 mg by mouth daily.        Marland Kitchen DISCONTD: NON FORMULARY as directed. METFORMIN 850 (PUMP)       . DISCONTD: NON FORMULARY Take 135 mg by mouth daily. Triplix        No Known Allergies  Past Medical History  Diagnosis Date  . Coronary artery disease     post PTCA and stenting  . Hyperlipidemia   . Hypertension   . Diabetes mellitus   . CORONARY ATHEROSCLEROSIS NATIVE CORONARY ARTERY 12/21/2009  . Kidney stones     Past Surgical History  Procedure Date  . Coronary angioplasty with stent placement     left circumflex artery    History  Smoking status  . Former Smoker  . Quit date: 11/13/1985  Smokeless tobacco  . Not on file    History  Alcohol Use No    Family History  Problem Relation Age of Onset  . Colon cancer Father   . Cancer Mother     Brain Tumor  . Hypertension Brother   . Hypertension Brother   . Hypertension Sister     Reviw of Systems:  Reviewed in the HPI.  All other systems are negative.  Physical Exam: BP 122/78  Pulse 76  Ht 5\' 11"  (1.803 m)  Wt 233 lb 9.6 oz (105.96 kg)  BMI 32.58 kg/m2 The patient is alert and oriented x 3.  The mood and affect are normal.  The skin is warm and dry.  Color is normal.  The HEENT exam reveals that the sclera are nonicteric.  The mucous  membranes are moist.  The carotids are 2+ without bruits.  There is no thyromegaly.  There is no JVD.  The lungs are clear.  The chest wall is non tender.  The heart exam reveals a regular rate with a normal S1 and S2.  There are no murmurs, gallops, or rubs.  The PMI is not displaced.   Abdominal exam reveals good bowel sounds.  There is no guarding or rebound.  There is no hepatosplenomegaly or tenderness.  There are no masses.  Exam of the legs reveal no clubbing, cyanosis, or edema.  The legs are without rashes.  The distal pulses are intact.  Cranial nerves II - XII are intact.  Motor and sensory functions are intact.  The gait is normal.  ECG: Normal sinus rhythm. Normal EKG. Assessment / Plan:

## 2011-08-21 ENCOUNTER — Other Ambulatory Visit: Payer: Self-pay | Admitting: Dermatology

## 2012-12-04 ENCOUNTER — Encounter: Payer: Self-pay | Admitting: *Deleted

## 2012-12-04 NOTE — Telephone Encounter (Signed)
Error

## 2012-12-09 ENCOUNTER — Encounter: Payer: Self-pay | Admitting: Gastroenterology

## 2013-02-18 ENCOUNTER — Other Ambulatory Visit: Payer: Self-pay | Admitting: Dermatology

## 2013-07-15 ENCOUNTER — Ambulatory Visit (INDEPENDENT_AMBULATORY_CARE_PROVIDER_SITE_OTHER): Payer: Medicare Other | Admitting: Emergency Medicine

## 2013-07-15 VITALS — BP 148/78 | HR 80 | Temp 98.3°F | Resp 16

## 2013-07-15 DIAGNOSIS — Z23 Encounter for immunization: Secondary | ICD-10-CM

## 2013-07-15 DIAGNOSIS — M25569 Pain in unspecified knee: Secondary | ICD-10-CM

## 2013-07-15 DIAGNOSIS — S81009A Unspecified open wound, unspecified knee, initial encounter: Secondary | ICD-10-CM

## 2013-07-15 DIAGNOSIS — S81012A Laceration without foreign body, left knee, initial encounter: Secondary | ICD-10-CM

## 2013-07-15 DIAGNOSIS — M25562 Pain in left knee: Secondary | ICD-10-CM

## 2013-07-15 NOTE — Progress Notes (Signed)
Verbal consent obtained from the patient.  Local anesthesia with 3cc Lidocaine 2% without epinephrine.  Wound scrubbed with soap and water and rinsed.  Wound closed with #8 4-0 Prolene simple interrupted sutures.  Wound cleansed and dressed.

## 2013-07-15 NOTE — Patient Instructions (Addendum)
WOUND CARE Please return in 9-10 days to have your stitches/staples removed or sooner if you have concerns. . Keep area clean and dry for 24 hours. Do not remove bandage, if applied. . After 24 hours, remove bandage and wash wound gently with mild soap and warm water. Reapply a new bandage after cleaning wound, if directed. . Continue daily cleansing with soap and water until stitches/staples are removed. . Do not apply any ointments or creams to the wound while stitches/staples are in place, as this may cause delayed healing. . Notify the office if you experience any of the following signs of infection: Swelling, redness, pus drainage, streaking, fever >101.0 F . Notify the office if you experience excessive bleeding that does not stop after 15-20 minutes of constant, firm pressure.    

## 2013-07-15 NOTE — Progress Notes (Signed)
[facility] [address] [place] [address] [phone]  Date:  07/15/2013   Name:  Jason Giles   DOB:  23-Apr-1946   MRN:  [id number]  PCP:  No primary provider on file.    Chief Complaint: cut left knee   History of Present Illness:  Jason Giles is a 67 y.o. very pleasant male patient who presents with the following:  Lacerated left knee with a razor knife today.  Bled rather vigorously as he is on plavix.  Not current on tetanus.  Denies other complaint or health concern today.   Patient Active Problem List   Diagnosis Date Noted  . HYPERLIPIDEMIA 01/26/2010  . HYPERTENSION 01/26/2010  . CORONARY ARTERY DISEASE 01/26/2010  . RECTAL BLEEDING 01/26/2010  . DM 12/21/2009  . CORONARY ATHEROSCLEROSIS NATIVE CORONARY ARTERY 12/21/2009  . PERSONAL HX COLONIC POLYPS 12/21/2009    Past Medical History  Diagnosis Date  . Coronary artery disease     post PTCA and stenting  . Hyperlipidemia   . Hypertension   . Diabetes mellitus   . CORONARY ATHEROSCLEROSIS NATIVE CORONARY ARTERY 12/21/2009  . Kidney stones     Past Surgical History  Procedure Laterality Date  . Coronary angioplasty with stent placement      left circumflex artery    History  Substance Use Topics  . Smoking status: Former Smoker    Quit date: 11/13/1985  . Smokeless tobacco: Not on file  . Alcohol Use: No    Family History  Problem Relation Age of Onset  . Colon cancer Father   . Cancer Mother     Brain Tumor  . Hypertension Brother   . Hypertension Brother   . Hypertension Sister     No Known Allergies  Medication list has been reviewed and updated.  Current Outpatient Prescriptions on File Prior to Visit  Medication Sig Dispense Refill  . aspirin 81 MG tablet Take 81 mg by mouth daily.        . insulin aspart (NOVOLOG) 100 UNIT/ML injection Inject 3.6 Units into the skin continuous. Pump 3.60 units      . metFORMIN (GLUCOPHAGE) 850 MG tablet Take 850 mg by  mouth 2 (two) times daily with a meal.        . metoprolol succinate (TOPROL-XL) 25 MG 24 hr tablet Take 25 mg by mouth 2 (two) times daily.        . multivitamin (THERAGRAN) per tablet Take 1 tablet by mouth daily.        Marland Kitchen PLAVIX 75 MG tablet Take 1 tablet by mouth Daily.      . rosuvastatin (CRESTOR) 20 MG tablet Take 20 mg by mouth daily.        . TRILIPIX 135 MG capsule Take 1 tablet by mouth Daily.      . Sildenafil Citrate (VIAGRA PO) Take 1 tablet by mouth as needed.         No current facility-administered medications on file prior to visit.    Review of Systems:  As per HPI, otherwise negative.    Physical Examination: Filed Vitals:   07/15/13 1050  BP: 148/78  Pulse: 80  Temp: 98.3 F (36.8 C)  Resp: 16   There were no vitals filed for this visit. There is no weight on file to calculate BMI. Ideal Body Weight:     GEN: WDWN, NAD, Non-toxic, Alert & Oriented x 3 HEENT: Atraumatic, Normocephalic.  Ears and Nose: No  external deformity. EXTR: No clubbing/cyanosis/edema NEURO: Normal gait.  PSYCH: Normally interactive. Conversant. Not depressed or anxious appearing.  Calm demeanor.  KNEE:  Left knee 4 cm laceration.  NATI.  No FB.    Assessment and Plan: Laceration LEFT knee TD Wound to be repaired Follow up in 10 days   Signed,  Phillips Odor, MD

## 2013-09-11 ENCOUNTER — Encounter: Payer: Self-pay | Admitting: Gastroenterology

## 2013-09-18 ENCOUNTER — Other Ambulatory Visit: Payer: Self-pay

## 2014-07-27 ENCOUNTER — Encounter: Payer: Self-pay | Admitting: Internal Medicine

## 2014-08-24 ENCOUNTER — Other Ambulatory Visit: Payer: Self-pay | Admitting: Dermatology

## 2014-09-22 ENCOUNTER — Ambulatory Visit (INDEPENDENT_AMBULATORY_CARE_PROVIDER_SITE_OTHER): Payer: Medicare Other | Admitting: Internal Medicine

## 2014-09-22 ENCOUNTER — Encounter: Payer: Self-pay | Admitting: Internal Medicine

## 2014-09-22 VITALS — BP 142/70 | HR 100 | Ht 71.0 in | Wt 228.0 lb

## 2014-09-22 DIAGNOSIS — Z794 Long term (current) use of insulin: Secondary | ICD-10-CM

## 2014-09-22 DIAGNOSIS — I25810 Atherosclerosis of coronary artery bypass graft(s) without angina pectoris: Secondary | ICD-10-CM

## 2014-09-22 DIAGNOSIS — Z86010 Personal history of colonic polyps: Secondary | ICD-10-CM

## 2014-09-22 DIAGNOSIS — Z800 Family history of malignant neoplasm of digestive organs: Secondary | ICD-10-CM

## 2014-09-22 DIAGNOSIS — E119 Type 2 diabetes mellitus without complications: Secondary | ICD-10-CM

## 2014-09-22 MED ORDER — MOVIPREP 100 G PO SOLR
1.0000 | Freq: Once | ORAL | Status: DC
Start: 2014-09-22 — End: 2014-11-23

## 2014-09-22 NOTE — Patient Instructions (Signed)
You have been scheduled for a colonoscopy. Please follow written instructions given to you at your visit today.  Please pick up your prep kit at the pharmacy within the next 1-3 days. If you use inhalers (even only as needed), please bring them with you on the day of your procedure.  

## 2014-09-22 NOTE — Progress Notes (Signed)
HISTORY OF PRESENT ILLNESS:  Jason Giles is a 68 y.o. male with multiple medical problems including hypertension, hyperlipidemia, insulin requiring diabetes mellitus, coronary artery disease for which she is status post coronary artery stent placement and is on chronic aspirin and Plavix, and adenomatous colon polyps. He is sent today regarding surveillance colonoscopy. Office visit arrange due to his high risk nature from comorbidities and current medications. The patient has a family history of colon cancer in his mother and has undergone previous colonoscopies with Dr. Verl Blalock. Most recently, negative colonoscopy (for neoplasia) in 2005. In March 2011 he had a cecal polyp removed which was found to be a tubular adenoma. The patient's procedure was complicated by a delayed post polypectomy bleed which required hospitalization but no transfusion. He has done well since. He was taken off Plavix for that procedure. He has been stable from a cardiac standpoint. His general medical problems are stable as well. GI review of systems upon careful review is entirely negative. He is interested in surveillance colonoscopy which was recommended 3 years from his last procedure.  REVIEW OF SYSTEMS:  All non-GI ROS negative except for arthritis  Past Medical History  Diagnosis Date  . Coronary artery disease     post PTCA and stenting  . Hyperlipidemia   . Hypertension   . Diabetes mellitus   . CORONARY ATHEROSCLEROSIS NATIVE CORONARY ARTERY 12/21/2009  . Kidney stones   . Colon polyp     adenomatous  . Diverticulosis   . Arthritis     Past Surgical History  Procedure Laterality Date  . Coronary angioplasty with stent placement      left circumflex artery    Social History Jason Giles  reports that he quit smoking about 28 years ago. He does not have any smokeless tobacco history on file. He reports that he drinks alcohol. He reports that he does not use illicit drugs.  family  history includes Cancer in his father; Colon cancer in his mother. There is no history of Colon polyps, Kidney disease, Diabetes, or Esophageal cancer.  No Known Allergies     PHYSICAL EXAMINATION: Vital signs: BP 142/70 mmHg  Pulse 100  Ht 5\' 11"  (1.803 m)  Wt 228 lb (103.42 kg)  BMI 31.81 kg/m2  Constitutional: generally well-appearing, no acute distress Psychiatric: alert and oriented x3, cooperative Eyes: extraocular movements intact, anicteric, conjunctiva pink Mouth: oral pharynx moist, no lesions Neck: supple no lymphadenopathy Cardiovascular: heart regular rate and rhythm, no murmur Lungs: clear to auscultation bilaterally Abdomen: soft,obese, nontender, nondistended, no obvious ascites, no peritoneal signs, normal bowel sounds, no organomegaly. Insulin pump in place Rectal:deferred until colonoscopy Extremities: no lower extremity edema bilaterally Skin: no lesions on visible extremities Neuro: No focal deficits.   ASSESSMENT:  #1. Personal history of adenomatous colon polyps. Due for surveillance  #2. Family history of colon cancer in his mother #3. Coronary artery disease status post coronary artery stent placement remotely. On aspirin and Plavix. His cardiologist is Dr. Acie Fredrickson #4. Insulin requiring diabetes mellitus. Managed by Dr. Osborne Casco #5. History of post polypectomy bleed   PLAN:  #1. Surveillance colonoscopy. The patient is and appropriate candidate without contraindication but is also high risk due to his comorbidities and the need to address antiplatelet therapy.The nature of the procedure, as well as the risks, benefits, and alternatives were carefully and thoroughly reviewed with the patient. Ample time for discussion and questions allowed. The patient understood, was satisfied, and agreed to proceed.Movi prep prescribed. The  patient instructed on its use #2. Continue aspirin but hold Plavix 5 days prior to the procedure. We will check with Dr. Acie Fredrickson to  confirm that this is acceptable #3. Hold oral diabetic agents the morning of the procedure. As well, the patient will check with Dr. Osborne Casco regarding adjustment of his insulin pump preprocedure to avoid on wanted hypoglycemia #4. Ongoing general medical care with Dr. Osborne Casco

## 2014-09-23 ENCOUNTER — Telehealth: Payer: Self-pay

## 2014-09-23 NOTE — Telephone Encounter (Signed)
  09/23/2014   RE: Jason Giles DOB: 03/27/1946 MRN: [id number]   Dear Acie Fredrickson,    We have scheduled the above patient for an endoscopic procedure. Our records show that he is on anticoagulation therapy.   Please advise as to how long the patient may come off his therapy of Plavix prior to the procedure, which is scheduled for 11/23/2014.  Please fax back/ or route the completed form to [facility] at [phone].   Sincerely,    Phillis Haggis

## 2014-09-23 NOTE — Telephone Encounter (Signed)
I have not seen Jason Giles in 3 years. We should probably see him before we can clear him to stop the meds.

## 2014-09-24 ENCOUNTER — Telehealth: Payer: Self-pay | Admitting: Cardiovascular Disease

## 2014-09-24 NOTE — Telephone Encounter (Signed)
New Message  Pt called back

## 2014-09-24 NOTE — Telephone Encounter (Signed)
Scheduled patient for office visit with Dr. Acie Fredrickson 12/8 for cardiac clearance for endo procedure in January.  Patient verbalized understanding and agreement.

## 2014-09-24 NOTE — Telephone Encounter (Signed)
Left message for patient to call office to schedule appointment.

## 2014-10-01 ENCOUNTER — Encounter: Payer: Self-pay | Admitting: Internal Medicine

## 2014-10-20 ENCOUNTER — Ambulatory Visit (INDEPENDENT_AMBULATORY_CARE_PROVIDER_SITE_OTHER): Payer: Medicare Other | Admitting: Cardiovascular Disease

## 2014-10-20 ENCOUNTER — Telehealth: Payer: Self-pay

## 2014-10-20 ENCOUNTER — Encounter: Payer: Self-pay | Admitting: Cardiovascular Disease

## 2014-10-20 VITALS — BP 134/82 | HR 80 | Ht 71.5 in | Wt 229.0 lb

## 2014-10-20 DIAGNOSIS — I10 Essential (primary) hypertension: Secondary | ICD-10-CM

## 2014-10-20 DIAGNOSIS — I2510 Atherosclerotic heart disease of native coronary artery without angina pectoris: Secondary | ICD-10-CM

## 2014-10-20 NOTE — Telephone Encounter (Signed)
CAD (coronary artery disease) - Thayer Headings, MD at 10/20/2014 8:30 AM     Status: Written Related Problem: CAD (coronary artery disease)    Jason Giles is doing well.  No complications He is at low risk for his upcoming colonoscopy and cataract surgery. He may hold his plavix prior to colonoscopy. I would like for him to continue ASA 81 mg a day during that time period.  I will see him in 1 year.

## 2014-10-20 NOTE — Progress Notes (Signed)
Jason Giles Date of Birth  May 30, 1946 [facility] [address]     [place], [place]  [address] [phone]  Fax  [phone]  Problem List 1. CAD - stenting 2011 ( Taxus DES in May , 2012)     History of Present Illness:  Pt is doing well.  Has retired since last visit. No chest pain .  He denies any angina or dyspnea.  Dec. 8.2015:  Jason Giles is a 68 yo who I follow for CAD, HTN, hyperlipidemia.  He went to the old office today. No having any problems. Is scheduled to have a colonoscopy and cataract surgery.       Current Outpatient Prescriptions on File Prior to Visit  Medication Sig Dispense Refill  . aspirin 81 MG tablet Take 81 mg by mouth daily.      . canagliflozin (INVOKANA) 300 MG TABS tablet Take 300 mg by mouth daily before breakfast.    . insulin aspart (NOVOLOG) 100 UNIT/ML injection Inject 3.6 Units into the skin continuous. Pump 3.60 units    . metFORMIN (GLUCOPHAGE) 850 MG tablet Take 850 mg by mouth 2 (two) times daily with a meal.      . metoprolol succinate (TOPROL-XL) 25 MG 24 hr tablet Take 25 mg by mouth 2 (two) times daily.      Marland Kitchen MOVIPREP 100 G SOLR Take 1 kit (200 g total) by mouth once. 1 kit 0  . multivitamin (THERAGRAN) per tablet Take 1 tablet by mouth daily.      Marland Kitchen PLAVIX 75 MG tablet Take 1 tablet by mouth Daily.    . rosuvastatin (CRESTOR) 40 MG tablet Take 40 mg by mouth daily.    . TRILIPIX 135 MG capsule Take 1 tablet by mouth Daily.     No current facility-administered medications on file prior to visit.    No Known Allergies  Past Medical History  Diagnosis Date  . Coronary artery disease     post PTCA and stenting  . Hyperlipidemia   . Hypertension   . Diabetes mellitus   . CORONARY ATHEROSCLEROSIS NATIVE CORONARY ARTERY 12/21/2009  . Kidney stones   . Colon polyp     adenomatous  . Diverticulosis   . Arthritis     Past Surgical History  Procedure Laterality Date  .  Coronary angioplasty with stent placement      left circumflex artery    History  Smoking status  . Former Smoker  . Quit date: 11/13/1985  Smokeless tobacco  . Not on file    History  Alcohol Use  . 0.0 oz/week  . 0 Not specified per week    Comment: Occassionally    Family History  Problem Relation Age of Onset  . Colon cancer Mother   . Cancer Father     Brain Tumor  . Colon polyps Neg Hx   . Kidney disease Neg Hx   . Diabetes Neg Hx   . Esophageal cancer Neg Hx     Reviw of Systems:  Reviewed in the HPI.  All other systems are negative.  Physical Exam: BP 134/82 mmHg  Pulse 80  Ht 5' 11.5" (1.816 m)  Wt 229 lb (103.874 kg)  BMI 31.50 kg/m2 The patient is alert and oriented x 3.  The mood and affect are normal.  The skin is warm and dry.  Color is normal.  The HEENT exam reveals that the sclera are nonicteric.  The mucous  membranes are moist.  The carotids are 2+ without bruits.  There is no thyromegaly.  There is no JVD.  The lungs are clear.  The chest wall is non tender.  The heart exam reveals a regular rate with a normal S1 and S2.  There are no murmurs, gallops, or rubs.  The PMI is not displaced.   Abdominal exam reveals good bowel sounds.  There is no guarding or rebound.  There is no hepatosplenomegaly or tenderness.  There are no masses.  Exam of the legs reveal no clubbing, cyanosis, or edema.  The legs are without rashes.  The distal pulses are intact.  Cranial nerves II - XII are intact.  Motor and sensory functions are intact.  The gait is normal.  ECG: Dec. 8, 2015:  NSR at 80.  1st degree AV block   Assessment / Plan:

## 2014-10-20 NOTE — Telephone Encounter (Signed)
I saw that Jason Giles has an appointment with you today.  Please send me a message letting me know if and for how long he can hold his blood thinner before his procedure.  Thanks!

## 2014-10-20 NOTE — Assessment & Plan Note (Signed)
Jason Giles is doing well.   No complications He is at low risk for his upcoming colonoscopy and cataract surgery. He may hold his plavix prior to colonoscopy.  I would like for him to continue ASA 81 mg a day during that time period.  I will see him in 1 year.

## 2014-10-20 NOTE — Patient Instructions (Signed)
Your physician recommends that you continue on your current medications as directed. Please refer to the Current Medication list given to you today.  Your physician wants you to follow-up in: 1 year with Dr. Nahser.  You will receive a reminder letter in the mail two months in advance. If you don't receive a letter, please call our office to schedule the follow-up appointment. Your physician recommends that you return for lab work in: 1 year on the day of or a few days before your office visit with Dr. Nahser.  You will need to FAST for this appointment - nothing to eat or drink after midnight the night before except water.   

## 2014-10-21 NOTE — Telephone Encounter (Signed)
I have spoken to patient and he states Dr Acie Fredrickson has asked him to hold Plavix 1 week prior to colonoscopy and remain on asa 81 mg throughout that time.

## 2014-11-23 ENCOUNTER — Ambulatory Visit (AMBULATORY_SURGERY_CENTER): Payer: Medicare Other | Admitting: Internal Medicine

## 2014-11-23 ENCOUNTER — Encounter: Payer: Self-pay | Admitting: Internal Medicine

## 2014-11-23 VITALS — BP 118/60 | HR 60 | Temp 96.2°F | Resp 25 | Ht 71.0 in | Wt 228.0 lb

## 2014-11-23 DIAGNOSIS — D120 Benign neoplasm of cecum: Secondary | ICD-10-CM

## 2014-11-23 DIAGNOSIS — D122 Benign neoplasm of ascending colon: Secondary | ICD-10-CM

## 2014-11-23 DIAGNOSIS — Z86010 Personal history of colonic polyps: Secondary | ICD-10-CM

## 2014-11-23 DIAGNOSIS — D124 Benign neoplasm of descending colon: Secondary | ICD-10-CM

## 2014-11-23 LAB — GLUCOSE, CAPILLARY
GLUCOSE-CAPILLARY: 172 mg/dL — AB (ref 70–99)
Glucose-Capillary: 147 mg/dL — ABNORMAL HIGH (ref 70–99)

## 2014-11-23 MED ORDER — SODIUM CHLORIDE 0.9 % IV SOLN: 500.0000 mL | INTRAVENOUS | Status: DC

## 2014-11-23 NOTE — Patient Instructions (Addendum)
YOU HAD AN ENDOSCOPIC PROCEDURE TODAY AT THE [facility]: Refer to the procedure report that was given to you for any specific questions about what was found during the examination.  If the procedure report does not answer your questions, please call your gastroenterologist to clarify.  If you requested that your care partner not be given the details of your procedure findings, then the procedure report has been included in a sealed envelope for you to review at your convenience later.  YOU SHOULD EXPECT: Some feelings of bloating in the abdomen. Passage of more gas than usual.  Walking can help get rid of the air that was put into your GI tract during the procedure and reduce the bloating. If you had a lower endoscopy (such as a colonoscopy or flexible sigmoidoscopy) you may notice spotting of blood in your stool or on the toilet paper. If you underwent a bowel prep for your procedure, then you may not have a normal bowel movement for a few days.  DIET: Your first meal following the procedure should be a light meal and then it is ok to progress to your normal diet.  A half-sandwich or bowl of soup is an example of a good first meal.  Heavy or fried foods are harder to digest and may make you feel nauseous or bloated.  Likewise meals heavy in dairy and vegetables can cause extra gas to form and this can also increase the bloating.  Drink plenty of fluids but you should avoid alcoholic beverages for 24 hours.  ACTIVITY: Your care partner should take you home directly after the procedure.  You should plan to take it easy, moving slowly for the rest of the day.  You can resume normal activity the day after the procedure however you should NOT DRIVE or use heavy machinery for 24 hours (because of the sedation medicines used during the test).    SYMPTOMS TO REPORT IMMEDIATELY: A gastroenterologist can be reached at any hour.  During normal business hours, 8:30 AM to 5:00 PM Monday through Friday,  call [phone].  After hours and on weekends, please call the GI answering service at [phone] who will take a message and have the physician on call contact you.   Following lower endoscopy (colonoscopy or flexible sigmoidoscopy):  Excessive amounts of blood in the stool  Significant tenderness or worsening of abdominal pains  Swelling of the abdomen that is new, acute  Fever of 100F or higher  FOLLOW UP: If any biopsies were taken you will be contacted by phone or by letter within the next 1-3 weeks.  Call your gastroenterologist if you have not heard about the biopsies in 3 weeks.  Our staff will call the home number listed on your records the next business day following your procedure to check on you and address any questions or concerns that you may have at that time regarding the information given to you following your procedure. This is a courtesy call and so if there is no answer at the home number and we have not heard from you through the emergency physician on call, we will assume that you have returned to your regular daily activities without incident.  SIGNATURES/CONFIDENTIALITY: You and/or your care partner have signed paperwork which will be entered into your electronic medical record.  These signatures attest to the fact that that the information above on your After Visit Summary has been reviewed and is understood.  Full responsibility of the confidentiality of this   discharge information lies with you and/or your care-partner.  Read all of the handouts given to you by your recovery room nurse.  Resume your plavix today.  Repeat colonoscopy in 3 years per Dr. Henrene Pastor.

## 2014-11-23 NOTE — Progress Notes (Signed)
Called to room to assist during endoscopic procedure.  Patient ID and intended procedure confirmed with present staff. Received instructions for my participation in the procedure from the performing physician.  

## 2014-11-23 NOTE — Progress Notes (Signed)
A/ox3 pleased with MAC, report to Suzanne RN 

## 2014-11-23 NOTE — Op Note (Signed)
[place]  [address] [place], [address]   COLONOSCOPY PROCEDURE REPORT  PATIENT: Jason Giles, Jason Giles  MR#: [id number] BIRTHDATE: 1946/02/07 , 77  yrs. old GENDER: male ENDOSCOPIST: Eustace Quail, MD REFERRED VJ:[id number] Program Recall PROCEDURE DATE:  11/23/2014 PROCEDURE:   Colonoscopy with snare polypectomy x 6 First Screening Colonoscopy - Avg.  risk and is 50 yrs.  old or older - No.  Prior Negative Screening - Now for repeat screening. N/A  History of Adenoma - Now for follow-up colonoscopy & has been > or = to 3 yrs.  Yes hx of adenoma.  Has been 3 or more years since last colonoscopy.  Polyps Removed Today? Yes. ASA CLASS:   Class III INDICATIONS:patient's family history of colon polyps and surveillance colonoscopy based on a history of adenomatous colonic polyp(s). Most recent exams 2005 (negative) in March 2011 (tubular adenoma). MEDICATIONS: Monitored anesthesia care and Propofol 400 mg IV DESCRIPTION OF PROCEDURE:   After the risks benefits and alternatives of the procedure were thoroughly explained, informed consent was obtained.  The digital rectal exam revealed no abnormalities of the rectum.   The [id number]  endoscope was introduced through the anus and advanced to the cecum, which was identified by both the appendix and ileocecal valve. No adverse events experienced.   The quality of the prep was good, using MoviPrep  The instrument was then slowly withdrawn as the colon was fully examined.  COLON FINDINGS: Six polyps ranging between 3-72mm in size were found in the descending (1), ascending colon (3), and the cecum (2).  A polypectomy was performed with a cold snare.  The resection was complete, the polyp tissue was completely retrieved and sent to histology.   The examination was otherwise normal.  Retroflexed views revealed no abnormalities. The time to cecum=5 minutes 25 seconds.  Withdrawal time=26 minutes 15 seconds.  The  scope was withdrawn and the procedure completed. COMPLICATIONS: There were no immediate complications. ENDOSCOPIC IMPRESSION: 1.   Six polyps were found in the colon:  polypectomy was performed with a cold snare 2.   The examination was otherwise normal RECOMMENDATIONS: 1. Repeat Colonoscopy in 3 years. 2. Resume Plavix today  eSigned:  Eustace Quail, MD 11/23/2014 2:39 PM   cc: The Patient and Domenick [name], MD   PATIENT NAME:  Jason Giles MR#: [id number]

## 2014-11-23 NOTE — Progress Notes (Signed)
Patient's wife put his insulin pump back on.

## 2014-11-24 ENCOUNTER — Telehealth: Payer: Self-pay | Admitting: *Deleted

## 2014-11-24 NOTE — Telephone Encounter (Signed)
  Follow up Call-  Call back number 11/23/2014  Post procedure Call Back phone  # [phone]  Permission to leave phone message Yes     Patient questions:  Do you have a fever, pain , or abdominal swelling? No. Pain Score  0 *  Have you tolerated food without any problems? Yes.    Have you been able to return to your normal activities? Yes.    Do you have any questions about your discharge instructions: Diet   No. Medications  No. Follow up visit  No.  Do you have questions or concerns about your Care? No.  Actions: * If pain score is 4 or above: No action needed, pain <4.

## 2014-12-01 ENCOUNTER — Encounter: Payer: Self-pay | Admitting: Internal Medicine

## 2015-03-18 ENCOUNTER — Encounter: Payer: Self-pay | Admitting: Gastroenterology

## 2015-05-10 ENCOUNTER — Other Ambulatory Visit: Payer: Self-pay

## 2015-06-24 ENCOUNTER — Telehealth: Payer: Self-pay | Admitting: Cardiovascular Disease

## 2015-06-24 NOTE — Telephone Encounter (Signed)
Mr. Alen is at low risk for his upcoming gum surgery. He may hold his Plavix for 7 days prior to surgery

## 2015-06-24 NOTE — Telephone Encounter (Signed)
New message    Request for surgical clearance:  1. What type of surgery is being performed? Gum surgery on upper right quadrant of mouth with incision  2. When is this surgery scheduled? August 30      3. Are there any medications that need to be held prior to surgery and how long?Plavix 3-5 prior  4. Name of physician performing surgery? Dr Mikey Bussing  5. What is your office phone and fax number? [facility] [phone]   Fax [phone]  Please call to discuss

## 2015-06-24 NOTE — Telephone Encounter (Signed)
Clearance printed and given to [employer] in HIM for faxing

## 2015-08-16 ENCOUNTER — Telehealth: Payer: Self-pay | Admitting: Cardiovascular Disease

## 2015-08-16 NOTE — Telephone Encounter (Signed)
Spoke with patient who states he had left shoulder soreness on Saturday.  He denies chest pain, SOB, n/v, diaphoresis.  States his wife was concerned because of cardiac history.  He denies any other recent symptoms that were concerning to him.  He is due for follow-up in December.  I advised that Dr. Acie Fredrickson is in the office this week and offered him an appointment.  Patient states that his wife prefers that he be seen; he is scheduled for 9:45 am tomorrow.

## 2015-08-16 NOTE — Telephone Encounter (Signed)
New Message  Pt c/o of Left Shoulder Pain: 1. Are you having CP right now? No  2. Are you experiencing any other symptoms (ex. SOB, nausea, vomiting, sweating)?No  3. How long have you been experiencing ? Experienced only once on Saturday morning   Comments: Just wanted to document that the pt had pain. If needed request a call back from the nurse. Wife was just concerned.

## 2015-08-17 ENCOUNTER — Encounter: Payer: Self-pay | Admitting: Cardiovascular Disease

## 2015-08-17 ENCOUNTER — Ambulatory Visit (INDEPENDENT_AMBULATORY_CARE_PROVIDER_SITE_OTHER): Payer: Medicare Other | Admitting: Cardiovascular Disease

## 2015-08-17 VITALS — BP 126/70 | HR 81 | Ht 71.0 in | Wt 223.6 lb

## 2015-08-17 DIAGNOSIS — I2510 Atherosclerotic heart disease of native coronary artery without angina pectoris: Secondary | ICD-10-CM

## 2015-08-17 DIAGNOSIS — I10 Essential (primary) hypertension: Secondary | ICD-10-CM

## 2015-08-17 MED ORDER — NITROGLYCERIN 0.4 MG SL SUBL: 0.4000 mg | SUBLINGUAL_TABLET | SUBLINGUAL | Status: DC | PRN

## 2015-08-17 NOTE — Patient Instructions (Addendum)
Medication Instructions:  Your physician recommends that you continue on your current medications as directed. Please refer to the Current Medication list given to you today.   Labwork: Your physician recommends that you return for FASTING (cholesterol, liver, basic metabolic panel) lab work on Monday morning anytime after 7:30 am   Testing/Procedures: None Ordered   Follow-Up: Your physician wants you to follow-up in: 1 year with Dr. Acie Fredrickson.  You will receive a reminder letter in the mail two months in advance. If you don't receive a letter, please call our office to schedule the follow-up appointment.

## 2015-08-17 NOTE — Progress Notes (Signed)
Jason Giles Date of Birth  1946/03/02 [facility] [address]     [place], [place]  [address] [phone]  Fax  [phone]  Problem List 1. CAD - stenting 2011 ( Taxus DES in May , 2012)  2. Diabetes Mellitus  3. Essential Hypertension     History of Present Illness:  Pt is doing well.  Has retired since last visit. No chest pain .  He denies any angina or dyspnea.  Dec. 8.2015:  Jason Giles is a 69 yo who I follow for CAD, HTN, hyperlipidemia.  He went to the old office today. No having any problems. Is scheduled to have a colonoscopy and cataract surgery.     Oct. 4, 2016:  Jason Giles is doing well.   No CP or dyspnea.   Has been having some left arm discomfort.   Noticed it when he got up from bed to use the bathroom.   No pain .   Possibly overworked it the day before .  Had done some heavy lifting the day of this discomfort. Has felt fine since.  Has been doing this normal activities since    Current Outpatient Prescriptions on File Prior to Visit  Medication Sig Dispense Refill  . aspirin 81 MG tablet Take 81 mg by mouth daily.      . canagliflozin (INVOKANA) 300 MG TABS tablet Take 300 mg by mouth daily before breakfast.    . insulin aspart (NOVOLOG) 100 UNIT/ML injection Inject 3.6 Units into the skin continuous. Pump 3.60 units    . metFORMIN (GLUCOPHAGE) 850 MG tablet Take 850 mg by mouth 2 (two) times daily with a meal.      . metoprolol succinate (TOPROL-XL) 25 MG 24 hr tablet Take 25 mg by mouth 2 (two) times daily.      . multivitamin (THERAGRAN) per tablet Take 1 tablet by mouth daily.      Marland Kitchen PLAVIX 75 MG tablet Take 1 tablet by mouth Daily.    . rosuvastatin (CRESTOR) 40 MG tablet Take 40 mg by mouth daily.    . TRILIPIX 135 MG capsule Take 1 tablet by mouth Daily.     No current facility-administered medications on file prior to visit.    No Known Allergies  Past Medical History  Diagnosis Date  .  Coronary artery disease     post PTCA and stenting  . Hyperlipidemia   . Hypertension   . Diabetes mellitus   . CORONARY ATHEROSCLEROSIS NATIVE CORONARY ARTERY 12/21/2009  . Kidney stones   . Colon polyp     adenomatous  . Diverticulosis   . Arthritis     Past Surgical History  Procedure Laterality Date  . Coronary angioplasty with stent placement      left circumflex artery    History  Smoking status  . Former Smoker  . Quit date: 11/13/1985  Smokeless tobacco  . Not on file    History  Alcohol Use  . 0.0 oz/week  . 0 Standard drinks or equivalent per week    Comment: Occassionally    Family History  Problem Relation Age of Onset  . Colon cancer Mother   . Cancer Father     Brain Tumor  . Colon polyps Neg Hx   . Kidney disease Neg Hx   . Diabetes Neg Hx   . Esophageal cancer Neg Hx     Reviw of Systems:  Reviewed in the HPI.  All other  systems are negative.  Physical Exam: BP 126/70 mmHg  Pulse 81  Ht 5\' 11"  (1.803 m)  Wt 101.424 kg (223 lb 9.6 oz)  BMI 31.20 kg/m2 The patient is alert and oriented x 3.  The mood and affect are normal.  The skin is warm and dry.  Color is normal.  The HEENT exam reveals that the sclera are nonicteric.  The mucous membranes are moist.  The carotids are 2+ without bruits.  There is no thyromegaly.  There is no JVD.  The lungs are clear.  The chest wall is non tender.  The heart exam reveals a regular rate with a normal S1 and S2.  There are no murmurs, gallops, or rubs.  The PMI is not displaced.   Abdominal exam reveals good bowel sounds.  There is no guarding or rebound.  There is no hepatosplenomegaly or tenderness.  There are no masses.  Exam of the legs reveal no clubbing, cyanosis, or edema.  The legs are without rashes.  The distal pulses are intact.  Cranial nerves II - XII are intact.  Motor and sensory functions are intact.  The gait is normal.  ECG: Oct. 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    Assessment / Plan:    1. CAD - stenting 2011 ( Taxus DES in May , 2012) -    He's doing very well. He's not had any recurrent episodes of angina. He did wake up in the middle of the night several weeks ago with some left shoulder pain. This occurred after he had done lots of physical exertion. I suspect that this pain was musculoskeletal. We will refill his nitroglycerin. He'll give me a call if he has any recurrent episodes of left shoulder pain or other concerning chest pain.  We'll check fasting labs on him next week.  2. Diabetes Mellitus   3. Essential Hypertension  -  Blood pressure is well-controlled. Continue current medications.    Nahser, Wonda Cheng, MD  08/17/2015 10:37 AM    [facility] [place],  [place], [place]  [address] Pager [phone] Phone: [phone]; Fax: [phone]   [facility]  [address] [place], [place]  [address] [phone]   Fax [phone]

## 2015-08-23 ENCOUNTER — Other Ambulatory Visit: Payer: Medicare Other

## 2015-09-22 ENCOUNTER — Telehealth: Payer: Self-pay | Admitting: Nurse Practitioner

## 2015-09-22 NOTE — Telephone Encounter (Signed)
Left message for patient regarding fasting lab appointment that was missed in October.

## 2015-09-23 NOTE — Telephone Encounter (Signed)
New Message   Pt states he is returning rn call

## 2015-09-23 NOTE — Telephone Encounter (Signed)
Spoke with patient who states he brought copies of lab work that he had recently had done with Dr. Osborne Casco.  I advised him that I did not receive the papers and that it has not been scanned into his chart.  He states he will bring another copy and I reviewed my schedule with him so that he can bring it on a day I am here.  He thanked me for the call.

## 2015-09-27 NOTE — Telephone Encounter (Signed)
Lab results received and reviewed by Dr. Acie Fredrickson.  Placed in box for scanning into chart by HIM

## 2015-10-19 ENCOUNTER — Other Ambulatory Visit: Payer: Medicare Other

## 2015-10-22 ENCOUNTER — Ambulatory Visit: Payer: Medicare Other | Admitting: Cardiovascular Disease

## 2015-12-29 DIAGNOSIS — E119 Type 2 diabetes mellitus without complications: Secondary | ICD-10-CM | POA: Diagnosis not present

## 2016-01-05 DIAGNOSIS — E119 Type 2 diabetes mellitus without complications: Secondary | ICD-10-CM | POA: Diagnosis not present

## 2016-01-05 DIAGNOSIS — Z794 Long term (current) use of insulin: Secondary | ICD-10-CM | POA: Diagnosis not present

## 2016-01-13 DIAGNOSIS — I10 Essential (primary) hypertension: Secondary | ICD-10-CM | POA: Diagnosis not present

## 2016-01-13 DIAGNOSIS — Z4681 Encounter for fitting and adjustment of insulin pump: Secondary | ICD-10-CM | POA: Diagnosis not present

## 2016-01-13 DIAGNOSIS — Z6831 Body mass index (BMI) 31.0-31.9, adult: Secondary | ICD-10-CM | POA: Diagnosis not present

## 2016-01-13 DIAGNOSIS — E103313 Type 1 diabetes mellitus with moderate nonproliferative diabetic retinopathy with macular edema, bilateral: Secondary | ICD-10-CM | POA: Diagnosis not present

## 2016-01-18 DIAGNOSIS — E7800 Pure hypercholesterolemia, unspecified: Secondary | ICD-10-CM | POA: Diagnosis not present

## 2016-01-18 DIAGNOSIS — Z794 Long term (current) use of insulin: Secondary | ICD-10-CM | POA: Diagnosis not present

## 2016-01-18 DIAGNOSIS — Z0000 Encounter for general adult medical examination without abnormal findings: Secondary | ICD-10-CM | POA: Diagnosis not present

## 2016-01-18 DIAGNOSIS — Z6831 Body mass index (BMI) 31.0-31.9, adult: Secondary | ICD-10-CM | POA: Diagnosis not present

## 2016-01-18 DIAGNOSIS — N183 Chronic kidney disease, stage 3 (moderate): Secondary | ICD-10-CM | POA: Diagnosis not present

## 2016-01-18 DIAGNOSIS — E103319 Type 1 diabetes mellitus with moderate nonproliferative diabetic retinopathy with macular edema, unspecified eye: Secondary | ICD-10-CM | POA: Diagnosis not present

## 2016-01-18 DIAGNOSIS — I10 Essential (primary) hypertension: Secondary | ICD-10-CM | POA: Diagnosis not present

## 2016-01-18 DIAGNOSIS — I1310 Hypertensive heart and chronic kidney disease without heart failure, with stage 1 through stage 4 chronic kidney disease, or unspecified chronic kidney disease: Secondary | ICD-10-CM | POA: Diagnosis not present

## 2016-01-18 DIAGNOSIS — E668 Other obesity: Secondary | ICD-10-CM | POA: Diagnosis not present

## 2016-01-18 DIAGNOSIS — I2510 Atherosclerotic heart disease of native coronary artery without angina pectoris: Secondary | ICD-10-CM | POA: Diagnosis not present

## 2016-01-20 DIAGNOSIS — E113311 Type 2 diabetes mellitus with moderate nonproliferative diabetic retinopathy with macular edema, right eye: Secondary | ICD-10-CM | POA: Diagnosis not present

## 2016-01-24 DIAGNOSIS — Z794 Long term (current) use of insulin: Secondary | ICD-10-CM | POA: Diagnosis not present

## 2016-01-24 DIAGNOSIS — E119 Type 2 diabetes mellitus without complications: Secondary | ICD-10-CM | POA: Diagnosis not present

## 2016-01-27 DIAGNOSIS — E113311 Type 2 diabetes mellitus with moderate nonproliferative diabetic retinopathy with macular edema, right eye: Secondary | ICD-10-CM | POA: Diagnosis not present

## 2016-01-27 DIAGNOSIS — E113312 Type 2 diabetes mellitus with moderate nonproliferative diabetic retinopathy with macular edema, left eye: Secondary | ICD-10-CM | POA: Diagnosis not present

## 2016-02-10 DIAGNOSIS — E119 Type 2 diabetes mellitus without complications: Secondary | ICD-10-CM | POA: Diagnosis not present

## 2016-02-17 DIAGNOSIS — H5213 Myopia, bilateral: Secondary | ICD-10-CM | POA: Diagnosis not present

## 2016-03-16 DIAGNOSIS — E113311 Type 2 diabetes mellitus with moderate nonproliferative diabetic retinopathy with macular edema, right eye: Secondary | ICD-10-CM | POA: Diagnosis not present

## 2016-03-21 ENCOUNTER — Ambulatory Visit (INDEPENDENT_AMBULATORY_CARE_PROVIDER_SITE_OTHER): Payer: Medicare Other | Admitting: Family Medicine

## 2016-03-21 VITALS — BP 128/80 | HR 80 | Temp 98.6°F | Resp 17 | Ht 71.5 in | Wt 227.0 lb

## 2016-03-21 DIAGNOSIS — W57XXXA Bitten or stung by nonvenomous insect and other nonvenomous arthropods, initial encounter: Secondary | ICD-10-CM | POA: Diagnosis not present

## 2016-03-21 DIAGNOSIS — S30860A Insect bite (nonvenomous) of lower back and pelvis, initial encounter: Secondary | ICD-10-CM

## 2016-03-21 MED ORDER — MUPIROCIN 2 % EX OINT: 1.0000 "application " | TOPICAL_OINTMENT | Freq: Four times a day (QID) | CUTANEOUS | Status: DC

## 2016-03-21 MED ORDER — DOXYCYCLINE HYCLATE 100 MG PO CAPS: 100.0000 mg | ORAL_CAPSULE | Freq: Two times a day (BID) | ORAL | Status: DC

## 2016-03-21 NOTE — Patient Instructions (Addendum)
IF you received an x-ray today, you will receive an invoice from [facility]. Please contact [facility] at [phone] with questions or concerns regarding your invoice.   IF you received labwork today, you will receive an invoice from [employer]. Please contact [facility] at [phone] with questions or concerns regarding your invoice.   Our billing staff will not be able to assist you with questions regarding bills from these companies.  You will be contacted with the lab results as soon as they are available. The fastest way to get your results is to activate your My Chart account. Instructions are located on the last page of this paperwork. If you have not heard from [place] regarding the results in 2 weeks, please contact this office.     Tick Bite Information Ticks are insects that attach themselves to the skin and draw blood for food. There are various types of ticks. Common types include wood ticks and deer ticks. Most ticks live in shrubs and grassy areas. Ticks can climb onto your body when you make contact with leaves or grass where the tick is waiting. The most common places on the body for ticks to attach themselves are the scalp, neck, armpits, waist, and groin. Most tick bites are harmless, but sometimes ticks carry germs that cause diseases. These germs can be spread to a person during the tick's feeding process. The chance of a disease spreading through a tick bite depends on:   The type of tick.  Time of year.   How long the tick is attached.   Geographic location.  HOW CAN YOU PREVENT TICK BITES? Take these steps to help prevent tick bites when you are outdoors:  Wear protective clothing. Long sleeves and long pants are best.   Wear white clothes so you can see ticks more easily.  Tuck your pant legs into your socks.   If walking on a trail, stay in the middle of the trail to avoid brushing against  bushes.  Avoid walking through areas with long grass.  Put insect repellent on all exposed skin and along boot tops, pant legs, and sleeve cuffs.   Check clothing, hair, and skin repeatedly and before going inside.   Brush off any ticks that are not attached.  Take a shower or bath as soon as possible after being outdoors.  WHAT IS THE PROPER WAY TO REMOVE A TICK? Ticks should be removed as soon as possible to help prevent diseases caused by tick bites. 1. If latex gloves are available, put them on before trying to remove a tick.  2. Using fine-point tweezers, grasp the tick as close to the skin as possible. You may also use curved forceps or a tick removal tool. Grasp the tick as close to its head as possible. Avoid grasping the tick on its body. 3. Pull gently with steady upward pressure until the tick lets go. Do not twist the tick or jerk it suddenly. This may break off the tick's head or mouth parts. 4. Do not squeeze or crush the tick's body. This could force disease-carrying fluids from the tick into your body.  5. After the tick is removed, wash the bite area and your hands with soap and water or other disinfectant such as alcohol. 6. Apply a small amount of antiseptic cream or ointment to the bite site.  7. Wash and disinfect any instruments that were used.  Do not try to remove a tick by applying a  hot match, petroleum jelly, or fingernail polish to the tick. These methods do not work and may increase the chances of disease being spread from the tick bite.  WHEN SHOULD YOU SEEK MEDICAL CARE? Contact your health care provider if you are unable to remove a tick from your skin or if a part of the tick breaks off and is stuck in the skin.  After a tick bite, you need to be aware of signs and symptoms that could be related to diseases spread by ticks. Contact your health care provider if you develop any of the following in the days or weeks after the tick bite:  Unexplained  fever.  Rash. A circular rash that appears days or weeks after the tick bite may indicate the possibility of Lyme disease. The rash may resemble a target with a bull's-eye and may occur at a different part of your body than the tick bite.  Redness and swelling in the area of the tick bite.   Tender, swollen lymph glands.   Diarrhea.   Weight loss.   Cough.   Fatigue.   Muscle, joint, or bone pain.   Abdominal pain.   Headache.   Lethargy or a change in your level of consciousness.  Difficulty walking or moving your legs.   Numbness in the legs.   Paralysis.  Shortness of breath.   Confusion.   Repeated vomiting.    This information is not intended to replace advice given to you by your health care provider. Make sure you discuss any questions you have with your health care provider.   Document Released: 10/27/2000 Document Revised: 11/20/2014 Document Reviewed: 04/09/2013 Elsevier Interactive Patient Education [employer].

## 2016-03-21 NOTE — Progress Notes (Signed)
By signing my name below I, Tereasa Coop, attest that this documentation has been prepared under the direction and in the presence of Delman Cheadle, MD. Electonically Signed. Tereasa Coop, Scribe 03/21/2016 at 8:43 AM  Subjective:    Patient ID: Jason Giles, male    DOB: 1946-04-05, 70 y.o.   MRN: [id number]  Chief Complaint  Patient presents with  . Insect Bite    Since thursday, on back.     HPI Jason Giles is a 70 y.o. male who presents to the [facility] complaining of 3 insect bites. Pt denies any fever, chills, new joint pains, rashes, cough, or dysuria. Bite marks are pruritic.   Pt's wife brought in part of the insect that they pulled off the pt 4 days ago, which looks suspiciously like a tick.   Pt's son did have RMSF when he was 2 yo - was hosp for 1 wk sev decades prior.    Past Medical History  Diagnosis Date  . Coronary artery disease     post PTCA and stenting  . Hyperlipidemia   . Hypertension   . Diabetes mellitus   . CORONARY ATHEROSCLEROSIS NATIVE CORONARY ARTERY 12/21/2009  . Kidney stones   . Colon polyp     adenomatous  . Diverticulosis   . Arthritis    Current Outpatient Prescriptions on File Prior to Visit  Medication Sig Dispense Refill  . aspirin 81 MG tablet Take 81 mg by mouth daily.      . canagliflozin (INVOKANA) 300 MG TABS tablet Take 300 mg by mouth daily before breakfast.    . insulin aspart (NOVOLOG) 100 UNIT/ML injection Inject 3.6 Units into the skin continuous. Pump 3.60 units    . metFORMIN (GLUCOPHAGE) 850 MG tablet Take 850 mg by mouth 2 (two) times daily with a meal. Reported on 03/21/2016    . metoprolol succinate (TOPROL-XL) 25 MG 24 hr tablet Take 25 mg by mouth 2 (two) times daily.      . multivitamin (THERAGRAN) per tablet Take 1 tablet by mouth daily.      . nitroGLYCERIN (NITROSTAT) 0.4 MG SL tablet Place 1 tablet (0.4 mg total) under the tongue every 5 (five) minutes as needed for chest pain. 25 tablet 6    . PLAVIX 75 MG tablet Take 1 tablet by mouth Daily.    . rosuvastatin (CRESTOR) 40 MG tablet Take 40 mg by mouth daily.    . TRILIPIX 135 MG capsule Take 1 tablet by mouth Daily.     No current facility-administered medications on file prior to visit.   No Known Allergies  Depression screen PHQ 2/9 03/21/2016  Decreased Interest 0  Down, Depressed, Hopeless 0  PHQ - 2 Score 0        Review of Systems  Constitutional: Negative for fever and chills.  HENT: Negative for congestion.   Eyes: Negative for visual disturbance.  Respiratory: Negative for cough.   Cardiovascular: Negative for chest pain.  Gastrointestinal: Negative for abdominal pain.  Genitourinary: Negative for dysuria.  Musculoskeletal: Positive for arthralgias (chronic). Negative for myalgias.  Skin: Negative for rash.       Positive for pruritic insect bites  Neurological: Negative for headaches.  Psychiatric/Behavioral: Negative for agitation.       Objective:  BP 128/80 mmHg  Pulse 80  Temp(Src) 98.6 F (37 C) (Oral)  Resp 17  Ht 5' 11.5" (1.816 m)  Wt 227 lb (102.967 kg)  BMI 31.22 kg/m2  SpO2 97%  Physical Exam  Constitutional: He is oriented to person, place, and time. He appears well-developed and well-nourished. No distress.  HENT:  Head: Normocephalic and atraumatic.  Eyes: Conjunctivae are normal. Pupils are equal, round, and reactive to light.  Neck: Neck supple.  Cardiovascular: Normal rate.   Pulmonary/Chest: Effort normal.  Musculoskeletal: Normal range of motion.  Neurological: He is alert and oriented to person, place, and time.  Skin: Skin is warm and dry.  Pt has 1 bite mark with 3cm blanching erythema on pt's rt lower flank and a second area with 2cm induration with no fluctuance and with central pinpoint puncture mark on his rt lower buttock. Pt had an additional bite mark on his rt lateral hip with 3cm erythema.    Psychiatric: He has a normal mood and affect. His behavior is  normal.  Nursing note and vitals reviewed.         Assessment & Plan:   1. Tick bite of back, initial encounter   Odd that he has 3 bites in close proximity but only one right flank lesion with induration which is where the tick was found - concerned this lesion is developing cellulitis and may progress to abscess so cover with doxy.  No concern for tick-born illness at this time but if sxs develop/worsen, RTC immed for recheck and labs.  Meds ordered this encounter  Medications  . doxycycline (VIBRAMYCIN) 100 MG capsule    Sig: Take 1 capsule (100 mg total) by mouth 2 (two) times daily.    Dispense:  20 capsule    Refill:  0  . mupirocin ointment (BACTROBAN) 2 %    Sig: Apply 1 application topically 4 (four) times daily.    Dispense:  30 g    Refill:  1    I personally performed the services described in this documentation, which was scribed in my presence. The recorded information has been reviewed and considered, and addended by me as needed.  Delman Cheadle, MD MPH

## 2016-03-22 DIAGNOSIS — E119 Type 2 diabetes mellitus without complications: Secondary | ICD-10-CM | POA: Diagnosis not present

## 2016-03-23 DIAGNOSIS — E113312 Type 2 diabetes mellitus with moderate nonproliferative diabetic retinopathy with macular edema, left eye: Secondary | ICD-10-CM | POA: Diagnosis not present

## 2016-04-03 DIAGNOSIS — Z794 Long term (current) use of insulin: Secondary | ICD-10-CM | POA: Diagnosis not present

## 2016-04-03 DIAGNOSIS — E119 Type 2 diabetes mellitus without complications: Secondary | ICD-10-CM | POA: Diagnosis not present

## 2016-04-18 DIAGNOSIS — E119 Type 2 diabetes mellitus without complications: Secondary | ICD-10-CM | POA: Diagnosis not present

## 2016-04-19 DIAGNOSIS — E119 Type 2 diabetes mellitus without complications: Secondary | ICD-10-CM | POA: Diagnosis not present

## 2016-05-09 DIAGNOSIS — E7800 Pure hypercholesterolemia, unspecified: Secondary | ICD-10-CM | POA: Diagnosis not present

## 2016-05-09 DIAGNOSIS — I10 Essential (primary) hypertension: Secondary | ICD-10-CM | POA: Diagnosis not present

## 2016-05-09 DIAGNOSIS — E1165 Type 2 diabetes mellitus with hyperglycemia: Secondary | ICD-10-CM | POA: Diagnosis not present

## 2016-05-09 DIAGNOSIS — I1310 Hypertensive heart and chronic kidney disease without heart failure, with stage 1 through stage 4 chronic kidney disease, or unspecified chronic kidney disease: Secondary | ICD-10-CM | POA: Diagnosis not present

## 2016-05-09 DIAGNOSIS — E668 Other obesity: Secondary | ICD-10-CM | POA: Diagnosis not present

## 2016-05-09 DIAGNOSIS — I2510 Atherosclerotic heart disease of native coronary artery without angina pectoris: Secondary | ICD-10-CM | POA: Diagnosis not present

## 2016-05-09 DIAGNOSIS — Z794 Long term (current) use of insulin: Secondary | ICD-10-CM | POA: Diagnosis not present

## 2016-05-09 DIAGNOSIS — E108 Type 1 diabetes mellitus with unspecified complications: Secondary | ICD-10-CM | POA: Diagnosis not present

## 2016-05-09 DIAGNOSIS — D692 Other nonthrombocytopenic purpura: Secondary | ICD-10-CM | POA: Diagnosis not present

## 2016-05-09 DIAGNOSIS — Z6831 Body mass index (BMI) 31.0-31.9, adult: Secondary | ICD-10-CM | POA: Diagnosis not present

## 2016-05-15 DIAGNOSIS — E119 Type 2 diabetes mellitus without complications: Secondary | ICD-10-CM | POA: Diagnosis not present

## 2016-05-15 DIAGNOSIS — E113211 Type 2 diabetes mellitus with mild nonproliferative diabetic retinopathy with macular edema, right eye: Secondary | ICD-10-CM | POA: Diagnosis not present

## 2016-06-01 DIAGNOSIS — E113311 Type 2 diabetes mellitus with moderate nonproliferative diabetic retinopathy with macular edema, right eye: Secondary | ICD-10-CM | POA: Diagnosis not present

## 2016-06-01 DIAGNOSIS — E113312 Type 2 diabetes mellitus with moderate nonproliferative diabetic retinopathy with macular edema, left eye: Secondary | ICD-10-CM | POA: Diagnosis not present

## 2016-06-27 DIAGNOSIS — E119 Type 2 diabetes mellitus without complications: Secondary | ICD-10-CM | POA: Diagnosis not present

## 2016-07-04 DIAGNOSIS — E119 Type 2 diabetes mellitus without complications: Secondary | ICD-10-CM | POA: Diagnosis not present

## 2016-07-04 DIAGNOSIS — Z794 Long term (current) use of insulin: Secondary | ICD-10-CM | POA: Diagnosis not present

## 2016-07-11 DIAGNOSIS — E113311 Type 2 diabetes mellitus with moderate nonproliferative diabetic retinopathy with macular edema, right eye: Secondary | ICD-10-CM | POA: Diagnosis not present

## 2016-07-11 DIAGNOSIS — E113312 Type 2 diabetes mellitus with moderate nonproliferative diabetic retinopathy with macular edema, left eye: Secondary | ICD-10-CM | POA: Diagnosis not present

## 2016-07-31 DIAGNOSIS — E119 Type 2 diabetes mellitus without complications: Secondary | ICD-10-CM | POA: Diagnosis not present

## 2016-08-09 DIAGNOSIS — Z125 Encounter for screening for malignant neoplasm of prostate: Secondary | ICD-10-CM | POA: Diagnosis not present

## 2016-08-09 DIAGNOSIS — I10 Essential (primary) hypertension: Secondary | ICD-10-CM | POA: Diagnosis not present

## 2016-08-09 DIAGNOSIS — E1165 Type 2 diabetes mellitus with hyperglycemia: Secondary | ICD-10-CM | POA: Diagnosis not present

## 2016-08-10 DIAGNOSIS — E113311 Type 2 diabetes mellitus with moderate nonproliferative diabetic retinopathy with macular edema, right eye: Secondary | ICD-10-CM | POA: Diagnosis not present

## 2016-08-10 DIAGNOSIS — E113312 Type 2 diabetes mellitus with moderate nonproliferative diabetic retinopathy with macular edema, left eye: Secondary | ICD-10-CM | POA: Diagnosis not present

## 2016-08-16 DIAGNOSIS — E1165 Type 2 diabetes mellitus with hyperglycemia: Secondary | ICD-10-CM | POA: Diagnosis not present

## 2016-08-16 DIAGNOSIS — Z6831 Body mass index (BMI) 31.0-31.9, adult: Secondary | ICD-10-CM | POA: Diagnosis not present

## 2016-08-16 DIAGNOSIS — D692 Other nonthrombocytopenic purpura: Secondary | ICD-10-CM | POA: Diagnosis not present

## 2016-08-16 DIAGNOSIS — Z1212 Encounter for screening for malignant neoplasm of rectum: Secondary | ICD-10-CM | POA: Diagnosis not present

## 2016-08-16 DIAGNOSIS — E668 Other obesity: Secondary | ICD-10-CM | POA: Diagnosis not present

## 2016-08-16 DIAGNOSIS — I10 Essential (primary) hypertension: Secondary | ICD-10-CM | POA: Diagnosis not present

## 2016-08-16 DIAGNOSIS — E108 Type 1 diabetes mellitus with unspecified complications: Secondary | ICD-10-CM | POA: Diagnosis not present

## 2016-08-16 DIAGNOSIS — I2510 Atherosclerotic heart disease of native coronary artery without angina pectoris: Secondary | ICD-10-CM | POA: Diagnosis not present

## 2016-08-16 DIAGNOSIS — Z0000 Encounter for general adult medical examination without abnormal findings: Secondary | ICD-10-CM | POA: Diagnosis not present

## 2016-08-16 DIAGNOSIS — I1310 Hypertensive heart and chronic kidney disease without heart failure, with stage 1 through stage 4 chronic kidney disease, or unspecified chronic kidney disease: Secondary | ICD-10-CM | POA: Diagnosis not present

## 2016-08-16 DIAGNOSIS — N183 Chronic kidney disease, stage 3 (moderate): Secondary | ICD-10-CM | POA: Diagnosis not present

## 2016-08-28 DIAGNOSIS — Z85828 Personal history of other malignant neoplasm of skin: Secondary | ICD-10-CM | POA: Diagnosis not present

## 2016-08-28 DIAGNOSIS — L820 Inflamed seborrheic keratosis: Secondary | ICD-10-CM | POA: Diagnosis not present

## 2016-08-28 DIAGNOSIS — C44311 Basal cell carcinoma of skin of nose: Secondary | ICD-10-CM | POA: Diagnosis not present

## 2016-08-28 DIAGNOSIS — D225 Melanocytic nevi of trunk: Secondary | ICD-10-CM | POA: Diagnosis not present

## 2016-08-28 DIAGNOSIS — D485 Neoplasm of uncertain behavior of skin: Secondary | ICD-10-CM | POA: Diagnosis not present

## 2016-08-28 DIAGNOSIS — L821 Other seborrheic keratosis: Secondary | ICD-10-CM | POA: Diagnosis not present

## 2016-08-28 DIAGNOSIS — L570 Actinic keratosis: Secondary | ICD-10-CM | POA: Diagnosis not present

## 2016-09-08 DIAGNOSIS — E119 Type 2 diabetes mellitus without complications: Secondary | ICD-10-CM | POA: Diagnosis not present

## 2016-09-12 ENCOUNTER — Encounter: Payer: Self-pay | Admitting: Cardiovascular Disease

## 2016-09-12 ENCOUNTER — Ambulatory Visit (INDEPENDENT_AMBULATORY_CARE_PROVIDER_SITE_OTHER): Payer: Medicare Other | Admitting: Cardiovascular Disease

## 2016-09-12 VITALS — BP 130/80 | HR 95 | Ht 71.5 in | Wt 232.4 lb

## 2016-09-12 DIAGNOSIS — I2510 Atherosclerotic heart disease of native coronary artery without angina pectoris: Secondary | ICD-10-CM

## 2016-09-12 DIAGNOSIS — I10 Essential (primary) hypertension: Secondary | ICD-10-CM

## 2016-09-12 MED ORDER — METOPROLOL SUCCINATE ER 50 MG PO TB24: 50.0000 mg | ORAL_TABLET | Freq: Every day | ORAL | 3 refills | Status: DC

## 2016-09-12 NOTE — Progress Notes (Signed)
Jason Giles Date of Birth  [birth year] [facility] [id number]     [place], [place]  [address] [phone]  Fax  [phone]  Problem List 1. CAD - stenting 2011 ( Taxus DES in May , 2012)  2. Diabetes Mellitus  3. Essential Hypertension     Previous Notes:   Pt is doing well.  Has retired since last visit. No chest pain .  He denies any angina or dyspnea.  Dec. 8.2015:  Jason Giles is a 70 yo who I follow for CAD, HTN, hyperlipidemia.  He went to the old office today. No having any problems. Is scheduled to have a colonoscopy and cataract surgery.     Oct. 4, 2016:  Jason Giles is doing well.   No CP or dyspnea.   Has been having some left arm discomfort.   Noticed it when he got up from bed to use the bathroom.   No pain .   Possibly overworked it the day before .  Had done some heavy lifting the day of this discomfort. Has felt fine since.  Has been doing this normal activities since   Oct. 31, 2017:   Jason Giles is seen back for follow up of his coronary artery disease.  He has a history of essential hypertension and diabetes.  We discussed Jason Giles.   His brother was the tennis coach up in [place] and he knows [name] .   No CP or dyspnea. Jason Giles to the dermatology - Basal cell CA on his nose Gets eye injections every 4 weeks  ( Macular degeneration )   Current Outpatient Prescriptions on File Prior to Visit  Medication Sig Dispense Refill  . aspirin 81 MG tablet Take 81 mg by mouth daily.      . canagliflozin (INVOKANA) 300 MG TABS tablet Take 300 mg by mouth daily before breakfast.    . doxycycline (VIBRAMYCIN) 100 MG capsule Take 1 capsule (100 mg total) by mouth 2 (two) times daily. 20 capsule 0  . insulin aspart (NOVOLOG) 100 UNIT/ML injection Inject 3.6 Units into the skin continuous. Pump 3.60 units    . metFORMIN (GLUCOPHAGE) 850 MG tablet Take 850 mg by mouth 2 (two) times daily with a meal. Reported on 03/21/2016    .  metoprolol succinate (TOPROL-XL) 25 MG 24 hr tablet Take 25 mg by mouth 2 (two) times daily.      . multivitamin (THERAGRAN) per tablet Take 1 tablet by mouth daily.      . mupirocin ointment (BACTROBAN) 2 % Apply 1 application topically 4 (four) times daily. 30 g 1  . nitroGLYCERIN (NITROSTAT) 0.4 MG SL tablet Place 1 tablet (0.4 mg total) under the tongue every 5 (five) minutes as needed for chest pain. 25 tablet 6  . PLAVIX 75 MG tablet Take 1 tablet by mouth Daily.    . rosuvastatin (CRESTOR) 40 MG tablet Take 40 mg by mouth daily.    . TRILIPIX 135 MG capsule Take 1 tablet by mouth Daily.     No current facility-administered medications on file prior to visit.     No Known Allergies  Past Medical History:  Diagnosis Date  . Arthritis   . Colon polyp    adenomatous  . Coronary artery disease    post PTCA and stenting  . CORONARY ATHEROSCLEROSIS NATIVE CORONARY ARTERY 12/21/2009  . Diabetes mellitus   . Diverticulosis   . Hyperlipidemia   . Hypertension   . Kidney  stones     Past Surgical History:  Procedure Laterality Date  . CORONARY ANGIOPLASTY WITH STENT PLACEMENT     left circumflex artery    History  Smoking Status  . Former Smoker  . Quit date: 11/13/1985  Smokeless Tobacco  . Not on file    History  Alcohol Use  . 0.0 oz/week    Comment: Occassionally    Family History  Problem Relation Age of Onset  . Colon cancer Mother   . Cancer Father     Brain Tumor  . Colon polyps Neg Hx   . Kidney disease Neg Hx   . Diabetes Neg Hx   . Esophageal cancer Neg Hx     Reviw of Systems:  Reviewed in the HPI.  All other systems are negative.  Physical Exam: BP 130/80 (BP Location: Left Arm, Patient Position: Sitting, Cuff Size: Large)   Pulse 95   Ht 5' 11.5" (1.816 m)   Wt 232 lb 6.4 oz (105.4 kg)   BMI 31.96 kg/m  The patient is alert and oriented x 3.  The mood and affect are normal.  The skin is warm and dry.  Color is normal.  The HEENT exam reveals  that the sclera are nonicteric.  The mucous membranes are moist.  The carotids are 2+ without bruits.  There is no thyromegaly.  There is no JVD.  The lungs are clear.  The chest wall is non tender.  The heart exam reveals a regular rate with a normal S1 and S2.  There are no murmurs, gallops, or rubs.  The PMI is not displaced.   Abdominal exam reveals good bowel sounds.  There is no guarding or rebound.  There is no hepatosplenomegaly or tenderness.  There are no masses.  Exam of the legs reveal no clubbing, cyanosis, or edema.  The legs are without rashes.  The distal pulses are intact.  Cranial nerves II - XII are intact.  Motor and sensory functions are intact.  The gait is normal.  ECG: Oct. 31, 2017:  NSR at 95.   RAD    Assessment / Plan:   1. CAD - stenting 2011 ( Taxus DES in May , 2012) -    No angina .  Lipids are followed by his primary medical doctor. I'll see him again in one year. We'll have his fasting labs sent over from his primary medical doctor's office.  2. Diabetes Mellitus   3. Essential Hypertension  -  Blood pressure is well-controlled. Continue current medications.    Mertie Moores, MD  09/12/2016 8:47 AM    [address],  [place], [place]  [address] Pager [phone] Phone: [phone]; Fax: [phone]

## 2016-09-12 NOTE — Patient Instructions (Addendum)
Medication Instructions:  INCREASE Toprol (Metoprolol) to 50 mg once daily   Labwork: None Ordered   Testing/Procedures: None Ordered   Follow-Up: Your physician wants you to follow-up in: 1 year with Dr. Acie Fredrickson.  You will receive a reminder letter in the mail two months in advance. If you don't receive a letter, please call our office to schedule the follow-up appointment.   If you need a refill on your cardiac medications before your next appointment, please call your pharmacy.   Thank you for choosing [facility]! Christen Bame, RN [phone]

## 2016-09-13 DIAGNOSIS — Z85828 Personal history of other malignant neoplasm of skin: Secondary | ICD-10-CM | POA: Diagnosis not present

## 2016-09-13 DIAGNOSIS — C44311 Basal cell carcinoma of skin of nose: Secondary | ICD-10-CM | POA: Diagnosis not present

## 2016-09-14 DIAGNOSIS — E113312 Type 2 diabetes mellitus with moderate nonproliferative diabetic retinopathy with macular edema, left eye: Secondary | ICD-10-CM | POA: Diagnosis not present

## 2016-09-14 DIAGNOSIS — E113311 Type 2 diabetes mellitus with moderate nonproliferative diabetic retinopathy with macular edema, right eye: Secondary | ICD-10-CM | POA: Diagnosis not present

## 2016-10-03 DIAGNOSIS — E119 Type 2 diabetes mellitus without complications: Secondary | ICD-10-CM | POA: Diagnosis not present

## 2016-10-03 DIAGNOSIS — Z794 Long term (current) use of insulin: Secondary | ICD-10-CM | POA: Diagnosis not present

## 2016-10-11 DIAGNOSIS — E119 Type 2 diabetes mellitus without complications: Secondary | ICD-10-CM | POA: Diagnosis not present

## 2016-10-12 DIAGNOSIS — E113312 Type 2 diabetes mellitus with moderate nonproliferative diabetic retinopathy with macular edema, left eye: Secondary | ICD-10-CM | POA: Diagnosis not present

## 2016-10-12 DIAGNOSIS — E113311 Type 2 diabetes mellitus with moderate nonproliferative diabetic retinopathy with macular edema, right eye: Secondary | ICD-10-CM | POA: Diagnosis not present

## 2016-10-25 DIAGNOSIS — E113312 Type 2 diabetes mellitus with moderate nonproliferative diabetic retinopathy with macular edema, left eye: Secondary | ICD-10-CM | POA: Diagnosis not present

## 2016-10-25 DIAGNOSIS — E113311 Type 2 diabetes mellitus with moderate nonproliferative diabetic retinopathy with macular edema, right eye: Secondary | ICD-10-CM | POA: Diagnosis not present

## 2016-11-09 DIAGNOSIS — E113312 Type 2 diabetes mellitus with moderate nonproliferative diabetic retinopathy with macular edema, left eye: Secondary | ICD-10-CM | POA: Diagnosis not present

## 2016-11-09 DIAGNOSIS — E113311 Type 2 diabetes mellitus with moderate nonproliferative diabetic retinopathy with macular edema, right eye: Secondary | ICD-10-CM | POA: Diagnosis not present

## 2016-11-21 DIAGNOSIS — E119 Type 2 diabetes mellitus without complications: Secondary | ICD-10-CM | POA: Diagnosis not present

## 2016-12-26 DIAGNOSIS — E113312 Type 2 diabetes mellitus with moderate nonproliferative diabetic retinopathy with macular edema, left eye: Secondary | ICD-10-CM | POA: Diagnosis not present

## 2016-12-26 DIAGNOSIS — E113311 Type 2 diabetes mellitus with moderate nonproliferative diabetic retinopathy with macular edema, right eye: Secondary | ICD-10-CM | POA: Diagnosis not present

## 2016-12-27 DIAGNOSIS — D692 Other nonthrombocytopenic purpura: Secondary | ICD-10-CM | POA: Diagnosis not present

## 2016-12-27 DIAGNOSIS — E1165 Type 2 diabetes mellitus with hyperglycemia: Secondary | ICD-10-CM | POA: Diagnosis not present

## 2016-12-27 DIAGNOSIS — I1310 Hypertensive heart and chronic kidney disease without heart failure, with stage 1 through stage 4 chronic kidney disease, or unspecified chronic kidney disease: Secondary | ICD-10-CM | POA: Diagnosis not present

## 2016-12-27 DIAGNOSIS — E11319 Type 2 diabetes mellitus with unspecified diabetic retinopathy without macular edema: Secondary | ICD-10-CM | POA: Diagnosis not present

## 2016-12-29 DIAGNOSIS — E119 Type 2 diabetes mellitus without complications: Secondary | ICD-10-CM | POA: Diagnosis not present

## 2017-01-04 DIAGNOSIS — E113313 Type 2 diabetes mellitus with moderate nonproliferative diabetic retinopathy with macular edema, bilateral: Secondary | ICD-10-CM | POA: Diagnosis not present

## 2017-01-08 DIAGNOSIS — E119 Type 2 diabetes mellitus without complications: Secondary | ICD-10-CM | POA: Diagnosis not present

## 2017-01-08 DIAGNOSIS — Z794 Long term (current) use of insulin: Secondary | ICD-10-CM | POA: Diagnosis not present

## 2017-02-01 DIAGNOSIS — E1165 Type 2 diabetes mellitus with hyperglycemia: Secondary | ICD-10-CM | POA: Diagnosis not present

## 2017-02-08 DIAGNOSIS — E113313 Type 2 diabetes mellitus with moderate nonproliferative diabetic retinopathy with macular edema, bilateral: Secondary | ICD-10-CM | POA: Diagnosis not present

## 2017-02-15 DIAGNOSIS — E113311 Type 2 diabetes mellitus with moderate nonproliferative diabetic retinopathy with macular edema, right eye: Secondary | ICD-10-CM | POA: Diagnosis not present

## 2017-02-15 DIAGNOSIS — E113312 Type 2 diabetes mellitus with moderate nonproliferative diabetic retinopathy with macular edema, left eye: Secondary | ICD-10-CM | POA: Diagnosis not present

## 2017-03-14 DIAGNOSIS — E1165 Type 2 diabetes mellitus with hyperglycemia: Secondary | ICD-10-CM | POA: Diagnosis not present

## 2017-03-21 DIAGNOSIS — E113312 Type 2 diabetes mellitus with moderate nonproliferative diabetic retinopathy with macular edema, left eye: Secondary | ICD-10-CM | POA: Diagnosis not present

## 2017-04-11 DIAGNOSIS — E1165 Type 2 diabetes mellitus with hyperglycemia: Secondary | ICD-10-CM | POA: Diagnosis not present

## 2017-04-12 DIAGNOSIS — E113313 Type 2 diabetes mellitus with moderate nonproliferative diabetic retinopathy with macular edema, bilateral: Secondary | ICD-10-CM | POA: Diagnosis not present

## 2017-04-20 DIAGNOSIS — Z794 Long term (current) use of insulin: Secondary | ICD-10-CM | POA: Diagnosis not present

## 2017-04-20 DIAGNOSIS — E119 Type 2 diabetes mellitus without complications: Secondary | ICD-10-CM | POA: Diagnosis not present

## 2017-04-25 DIAGNOSIS — E113311 Type 2 diabetes mellitus with moderate nonproliferative diabetic retinopathy with macular edema, right eye: Secondary | ICD-10-CM | POA: Diagnosis not present

## 2017-04-25 DIAGNOSIS — I1310 Hypertensive heart and chronic kidney disease without heart failure, with stage 1 through stage 4 chronic kidney disease, or unspecified chronic kidney disease: Secondary | ICD-10-CM | POA: Diagnosis not present

## 2017-04-25 DIAGNOSIS — N183 Chronic kidney disease, stage 3 (moderate): Secondary | ICD-10-CM | POA: Diagnosis not present

## 2017-04-25 DIAGNOSIS — E1165 Type 2 diabetes mellitus with hyperglycemia: Secondary | ICD-10-CM | POA: Diagnosis not present

## 2017-04-25 DIAGNOSIS — E113312 Type 2 diabetes mellitus with moderate nonproliferative diabetic retinopathy with macular edema, left eye: Secondary | ICD-10-CM | POA: Diagnosis not present

## 2017-04-25 DIAGNOSIS — E11319 Type 2 diabetes mellitus with unspecified diabetic retinopathy without macular edema: Secondary | ICD-10-CM | POA: Diagnosis not present

## 2017-05-22 DIAGNOSIS — E1165 Type 2 diabetes mellitus with hyperglycemia: Secondary | ICD-10-CM | POA: Diagnosis not present

## 2017-06-12 DIAGNOSIS — H35372 Puckering of macula, left eye: Secondary | ICD-10-CM | POA: Diagnosis not present

## 2017-06-12 DIAGNOSIS — E113293 Type 2 diabetes mellitus with mild nonproliferative diabetic retinopathy without macular edema, bilateral: Secondary | ICD-10-CM | POA: Diagnosis not present

## 2017-07-02 DIAGNOSIS — E1165 Type 2 diabetes mellitus with hyperglycemia: Secondary | ICD-10-CM | POA: Diagnosis not present

## 2017-07-17 DIAGNOSIS — E119 Type 2 diabetes mellitus without complications: Secondary | ICD-10-CM | POA: Diagnosis not present

## 2017-07-17 DIAGNOSIS — Z794 Long term (current) use of insulin: Secondary | ICD-10-CM | POA: Diagnosis not present

## 2017-08-07 DIAGNOSIS — E113312 Type 2 diabetes mellitus with moderate nonproliferative diabetic retinopathy with macular edema, left eye: Secondary | ICD-10-CM | POA: Diagnosis not present

## 2017-08-07 DIAGNOSIS — E113311 Type 2 diabetes mellitus with moderate nonproliferative diabetic retinopathy with macular edema, right eye: Secondary | ICD-10-CM | POA: Diagnosis not present

## 2017-08-13 DIAGNOSIS — E1165 Type 2 diabetes mellitus with hyperglycemia: Secondary | ICD-10-CM | POA: Diagnosis not present

## 2017-08-20 DIAGNOSIS — E7800 Pure hypercholesterolemia, unspecified: Secondary | ICD-10-CM | POA: Diagnosis not present

## 2017-08-20 DIAGNOSIS — E11319 Type 2 diabetes mellitus with unspecified diabetic retinopathy without macular edema: Secondary | ICD-10-CM | POA: Diagnosis not present

## 2017-08-20 DIAGNOSIS — N183 Chronic kidney disease, stage 3 (moderate): Secondary | ICD-10-CM | POA: Diagnosis not present

## 2017-08-20 DIAGNOSIS — Z125 Encounter for screening for malignant neoplasm of prostate: Secondary | ICD-10-CM | POA: Diagnosis not present

## 2017-08-23 DIAGNOSIS — E113312 Type 2 diabetes mellitus with moderate nonproliferative diabetic retinopathy with macular edema, left eye: Secondary | ICD-10-CM | POA: Diagnosis not present

## 2017-08-23 DIAGNOSIS — E113311 Type 2 diabetes mellitus with moderate nonproliferative diabetic retinopathy with macular edema, right eye: Secondary | ICD-10-CM | POA: Diagnosis not present

## 2017-08-28 DIAGNOSIS — Z23 Encounter for immunization: Secondary | ICD-10-CM | POA: Diagnosis not present

## 2017-08-28 DIAGNOSIS — E1165 Type 2 diabetes mellitus with hyperglycemia: Secondary | ICD-10-CM | POA: Diagnosis not present

## 2017-09-04 DIAGNOSIS — Z85828 Personal history of other malignant neoplasm of skin: Secondary | ICD-10-CM | POA: Diagnosis not present

## 2017-09-04 DIAGNOSIS — D225 Melanocytic nevi of trunk: Secondary | ICD-10-CM | POA: Diagnosis not present

## 2017-09-04 DIAGNOSIS — L570 Actinic keratosis: Secondary | ICD-10-CM | POA: Diagnosis not present

## 2017-09-04 DIAGNOSIS — L821 Other seborrheic keratosis: Secondary | ICD-10-CM | POA: Diagnosis not present

## 2017-09-06 DIAGNOSIS — N183 Chronic kidney disease, stage 3 (moderate): Secondary | ICD-10-CM | POA: Diagnosis not present

## 2017-09-06 DIAGNOSIS — Z0000 Encounter for general adult medical examination without abnormal findings: Secondary | ICD-10-CM | POA: Diagnosis not present

## 2017-09-06 DIAGNOSIS — Z794 Long term (current) use of insulin: Secondary | ICD-10-CM | POA: Diagnosis not present

## 2017-09-06 DIAGNOSIS — E1165 Type 2 diabetes mellitus with hyperglycemia: Secondary | ICD-10-CM | POA: Diagnosis not present

## 2017-09-18 DIAGNOSIS — E113311 Type 2 diabetes mellitus with moderate nonproliferative diabetic retinopathy with macular edema, right eye: Secondary | ICD-10-CM | POA: Diagnosis not present

## 2017-09-18 DIAGNOSIS — E113312 Type 2 diabetes mellitus with moderate nonproliferative diabetic retinopathy with macular edema, left eye: Secondary | ICD-10-CM | POA: Diagnosis not present

## 2017-10-01 ENCOUNTER — Ambulatory Visit: Payer: Medicare Other | Admitting: Cardiovascular Disease

## 2017-10-05 DIAGNOSIS — E1165 Type 2 diabetes mellitus with hyperglycemia: Secondary | ICD-10-CM | POA: Diagnosis not present

## 2017-10-09 DIAGNOSIS — E113311 Type 2 diabetes mellitus with moderate nonproliferative diabetic retinopathy with macular edema, right eye: Secondary | ICD-10-CM | POA: Diagnosis not present

## 2017-10-09 DIAGNOSIS — E113312 Type 2 diabetes mellitus with moderate nonproliferative diabetic retinopathy with macular edema, left eye: Secondary | ICD-10-CM | POA: Diagnosis not present

## 2017-10-10 ENCOUNTER — Encounter: Payer: Self-pay | Admitting: Cardiovascular Disease

## 2017-10-11 DIAGNOSIS — Z794 Long term (current) use of insulin: Secondary | ICD-10-CM | POA: Diagnosis not present

## 2017-10-11 DIAGNOSIS — E119 Type 2 diabetes mellitus without complications: Secondary | ICD-10-CM | POA: Diagnosis not present

## 2017-10-16 DIAGNOSIS — E113312 Type 2 diabetes mellitus with moderate nonproliferative diabetic retinopathy with macular edema, left eye: Secondary | ICD-10-CM | POA: Diagnosis not present

## 2017-10-16 DIAGNOSIS — E113311 Type 2 diabetes mellitus with moderate nonproliferative diabetic retinopathy with macular edema, right eye: Secondary | ICD-10-CM | POA: Diagnosis not present

## 2017-10-18 ENCOUNTER — Encounter: Payer: Self-pay | Admitting: Cardiovascular Disease

## 2017-10-18 ENCOUNTER — Ambulatory Visit: Payer: Medicare Other | Admitting: Cardiovascular Disease

## 2017-10-18 VITALS — BP 130/70 | HR 89 | Ht 71.0 in | Wt 232.0 lb

## 2017-10-18 DIAGNOSIS — I10 Essential (primary) hypertension: Secondary | ICD-10-CM | POA: Diagnosis not present

## 2017-10-18 DIAGNOSIS — I2510 Atherosclerotic heart disease of native coronary artery without angina pectoris: Secondary | ICD-10-CM

## 2017-10-18 DIAGNOSIS — E782 Mixed hyperlipidemia: Secondary | ICD-10-CM

## 2017-10-18 NOTE — Progress Notes (Signed)
Jason Giles Date of Birth  [birth year] [facility] [address]     [place], [place]  [address] [phone]  Fax  [phone]  Problem List 1. CAD - stenting 2011 ( Taxus DES in May , 2012)  2. Diabetes Mellitus  3. Essential Hypertension     Previous Notes:   Pt is doing well.  Has retired since last visit. No chest pain .  He denies any angina or dyspnea.  Dec. 8.2015:  Jason Giles is a 71 yo who I follow for CAD, HTN, hyperlipidemia.  He went to the old office today. No having any problems. Is scheduled to have a colonoscopy and cataract surgery.     Oct. 4, 2016:  Jason Giles is doing well.   No CP or dyspnea.   Has been having some left arm discomfort.   Noticed it when he got up from bed to use the bathroom.   No pain .   Possibly overworked it the day before .  Had done some heavy lifting the day of this discomfort. Has felt fine since.  Has been doing this normal activities since   Oct. 31, 2017:   Jason Giles is seen back for follow up of his coronary artery disease.  He has a history of essential hypertension and diabetes.  We discussed Jason Giles.   His brother was the tennis coach up in [place] and he knows [place] .   No CP or dyspnea. Jason Giles to the dermatology - Basal cell CA on his nose Gets eye injections every 4 weeks  ( Macular degeneration )   Dec. 6, 2018:  Jason Giles is seen today for a follow-up visit.  He has a history of coronary artery disease-status post stenting  Still having issues with his eyes  No CP , no dyspnea.     Current Outpatient Medications on File Prior to Visit  Medication Sig Dispense Refill  . aspirin 81 MG tablet Take 81 mg by mouth daily.      Marland Kitchen BOOSTRIX 5-2.5-18.5 injection Inject as directed once.  0  . canagliflozin (INVOKANA) 300 MG TABS tablet Take 300 mg by mouth daily before breakfast.    . doxycycline (VIBRAMYCIN) 100 MG capsule Take 1 capsule (100 mg total) by mouth 2 (two) times daily.  20 capsule 0  . insulin aspart (NOVOLOG) 100 UNIT/ML injection Inject 3.6 Units into the skin continuous. Pump 3.60 units    . metFORMIN (GLUCOPHAGE) 850 MG tablet Take 850 mg by mouth 2 (two) times daily with a meal. Reported on 03/21/2016    . multivitamin (THERAGRAN) per tablet Take 1 tablet by mouth daily.      . nitroGLYCERIN (NITROSTAT) 0.4 MG SL tablet Place 1 tablet (0.4 mg total) under the tongue every 5 (five) minutes as needed for chest pain. 25 tablet 6  . PLAVIX 75 MG tablet Take 1 tablet by mouth Daily.    . rosuvastatin (CRESTOR) 40 MG tablet Take 40 mg by mouth daily.    . TRILIPIX 135 MG capsule Take 1 tablet by mouth Daily.    . metoprolol succinate (TOPROL-XL) 50 MG 24 hr tablet Take 1 tablet (50 mg total) by mouth daily. Take with or immediately following a meal. 90 tablet 3   No current facility-administered medications on file prior to visit.     No Known Allergies  Past Medical History:  Diagnosis Date  . Arthritis   . Colon polyp    adenomatous  .  Coronary artery disease    post PTCA and stenting  . CORONARY ATHEROSCLEROSIS NATIVE CORONARY ARTERY 12/21/2009  . Diabetes mellitus   . Diverticulosis   . Hyperlipidemia   . Hypertension   . Kidney stones     Past Surgical History:  Procedure Laterality Date  . CORONARY ANGIOPLASTY WITH STENT PLACEMENT     left circumflex artery    Social History   Tobacco Use  Smoking Status Former Smoker  . Last attempt to quit: 11/13/1985  . Years since quitting: 31.9  Smokeless Tobacco Never Used    Social History   Substance and Sexual Activity  Alcohol Use Yes  . Alcohol/week: 0.0 oz   Comment: Occassionally    Family History  Problem Relation Age of Onset  . Cancer Father        Brain Tumor  . Colon cancer Mother   . Colon polyps Neg Hx   . Kidney disease Neg Hx   . Diabetes Neg Hx   . Esophageal cancer Neg Hx     Reviw of Systems:  Reviewed in the HPI.  All other systems are negative.  Physical  Exam: Blood pressure 130/70, pulse 89, height 5\' 11"  (1.803 m), weight 232 lb (105.2 kg), SpO2 95 %.  GEN:  Well nourished, well developed in no acute distress HEENT: Normal NECK: No JVD; No carotid bruits LYMPHATICS: No lymphadenopathy CARDIAC: RR, no murmurs, rubs, gallops RESPIRATORY:  Clear to auscultation without rales, wheezing or rhonchi  ABDOMEN: Soft, non-tender, non-distended MUSCULOSKELETAL:  No edema; No deformity  SKIN: Warm and dry NEUROLOGIC:  Alert and oriented x 3   ECG: Dec. 6 2018:   NSR at 89.   1st degree AV block     Assessment / Plan:   1. CAD - stenting 2011 ( Taxus DES in May , 2012) -     No angina .  Doing well.  Labs from Dr. Osborne Casco look ok  ( except Hb A1c)  LDL = 60 HDL = 36 Continue current meds    2. Diabetes Mellitus  - HbA1C is 8.8  Needs to see a nurtritionist.    3. Essential Hypertension  -   BP is well controlled Cont meds   Mertie Moores, MD  10/18/2017 12:19 PM    [place] [address]  [place], [place]  [address] Pager [phone] Phone: [phone]; Fax: [phone]

## 2017-10-18 NOTE — Patient Instructions (Signed)
Medication Instructions:  Your physician recommends that you continue on your current medications as directed. Please refer to the Current Medication list given to you today.   Labwork: None Ordered   Testing/Procedures: None Ordered   Follow-Up: Your physician wants you to follow-up in: 1 year with Dr. Nahser.  You will receive a reminder letter in the mail two months in advance. If you don't receive a letter, please call our office to schedule the follow-up appointment.   If you need a refill on your cardiac medications before your next appointment, please call your pharmacy.   Thank you for choosing [facility]! Ryleigh Esqueda, RN [phone]    

## 2017-11-07 DIAGNOSIS — E1165 Type 2 diabetes mellitus with hyperglycemia: Secondary | ICD-10-CM | POA: Diagnosis not present

## 2017-11-15 DIAGNOSIS — E113311 Type 2 diabetes mellitus with moderate nonproliferative diabetic retinopathy with macular edema, right eye: Secondary | ICD-10-CM | POA: Diagnosis not present

## 2017-11-15 DIAGNOSIS — E113312 Type 2 diabetes mellitus with moderate nonproliferative diabetic retinopathy with macular edema, left eye: Secondary | ICD-10-CM | POA: Diagnosis not present

## 2017-11-20 DIAGNOSIS — E113311 Type 2 diabetes mellitus with moderate nonproliferative diabetic retinopathy with macular edema, right eye: Secondary | ICD-10-CM | POA: Diagnosis not present

## 2017-11-20 DIAGNOSIS — E113312 Type 2 diabetes mellitus with moderate nonproliferative diabetic retinopathy with macular edema, left eye: Secondary | ICD-10-CM | POA: Diagnosis not present

## 2017-12-04 DIAGNOSIS — H903 Sensorineural hearing loss, bilateral: Secondary | ICD-10-CM | POA: Diagnosis not present

## 2017-12-04 DIAGNOSIS — T161XXA Foreign body in right ear, initial encounter: Secondary | ICD-10-CM | POA: Diagnosis not present

## 2017-12-04 DIAGNOSIS — H6123 Impacted cerumen, bilateral: Secondary | ICD-10-CM | POA: Diagnosis not present

## 2017-12-04 DIAGNOSIS — E109 Type 1 diabetes mellitus without complications: Secondary | ICD-10-CM | POA: Diagnosis not present

## 2017-12-07 DIAGNOSIS — N183 Chronic kidney disease, stage 3 (moderate): Secondary | ICD-10-CM | POA: Diagnosis not present

## 2017-12-07 DIAGNOSIS — E1165 Type 2 diabetes mellitus with hyperglycemia: Secondary | ICD-10-CM | POA: Diagnosis not present

## 2017-12-07 DIAGNOSIS — I1310 Hypertensive heart and chronic kidney disease without heart failure, with stage 1 through stage 4 chronic kidney disease, or unspecified chronic kidney disease: Secondary | ICD-10-CM | POA: Diagnosis not present

## 2017-12-07 DIAGNOSIS — E11319 Type 2 diabetes mellitus with unspecified diabetic retinopathy without macular edema: Secondary | ICD-10-CM | POA: Diagnosis not present

## 2017-12-11 DIAGNOSIS — E1165 Type 2 diabetes mellitus with hyperglycemia: Secondary | ICD-10-CM | POA: Diagnosis not present

## 2017-12-18 DIAGNOSIS — E113312 Type 2 diabetes mellitus with moderate nonproliferative diabetic retinopathy with macular edema, left eye: Secondary | ICD-10-CM | POA: Diagnosis not present

## 2017-12-18 DIAGNOSIS — E113311 Type 2 diabetes mellitus with moderate nonproliferative diabetic retinopathy with macular edema, right eye: Secondary | ICD-10-CM | POA: Diagnosis not present

## 2018-01-08 DIAGNOSIS — H35372 Puckering of macula, left eye: Secondary | ICD-10-CM | POA: Diagnosis not present

## 2018-01-08 DIAGNOSIS — E113312 Type 2 diabetes mellitus with moderate nonproliferative diabetic retinopathy with macular edema, left eye: Secondary | ICD-10-CM | POA: Diagnosis not present

## 2018-01-08 DIAGNOSIS — E113311 Type 2 diabetes mellitus with moderate nonproliferative diabetic retinopathy with macular edema, right eye: Secondary | ICD-10-CM | POA: Diagnosis not present

## 2018-01-09 ENCOUNTER — Encounter: Payer: Self-pay | Admitting: Internal Medicine

## 2018-01-09 DIAGNOSIS — N183 Chronic kidney disease, stage 3 (moderate): Secondary | ICD-10-CM | POA: Diagnosis not present

## 2018-01-09 DIAGNOSIS — I10 Essential (primary) hypertension: Secondary | ICD-10-CM | POA: Diagnosis not present

## 2018-01-09 DIAGNOSIS — Z794 Long term (current) use of insulin: Secondary | ICD-10-CM | POA: Diagnosis not present

## 2018-01-09 DIAGNOSIS — E1165 Type 2 diabetes mellitus with hyperglycemia: Secondary | ICD-10-CM | POA: Diagnosis not present

## 2018-01-18 DIAGNOSIS — E1129 Type 2 diabetes mellitus with other diabetic kidney complication: Secondary | ICD-10-CM | POA: Diagnosis not present

## 2018-01-21 DIAGNOSIS — E119 Type 2 diabetes mellitus without complications: Secondary | ICD-10-CM | POA: Diagnosis not present

## 2018-01-21 DIAGNOSIS — Z794 Long term (current) use of insulin: Secondary | ICD-10-CM | POA: Diagnosis not present

## 2018-01-22 DIAGNOSIS — E113312 Type 2 diabetes mellitus with moderate nonproliferative diabetic retinopathy with macular edema, left eye: Secondary | ICD-10-CM | POA: Diagnosis not present

## 2018-01-22 DIAGNOSIS — H35372 Puckering of macula, left eye: Secondary | ICD-10-CM | POA: Diagnosis not present

## 2018-01-25 DIAGNOSIS — E1165 Type 2 diabetes mellitus with hyperglycemia: Secondary | ICD-10-CM | POA: Diagnosis not present

## 2018-01-28 ENCOUNTER — Telehealth: Payer: Self-pay | Admitting: Nurse Practitioner

## 2018-01-28 NOTE — Telephone Encounter (Signed)
Patient is at low risk for dental procedure He may hold Plavix for 5 days prior to procedure if needed  I would prefer that he continue the ASA 81 mg a day if possible

## 2018-01-28 NOTE — Telephone Encounter (Signed)
Notified Dr. Cindra Eves office of clearance coming from Dr. Acie Fredrickson and fax sent

## 2018-01-28 NOTE — Telephone Encounter (Signed)
   [facility] Pre-operative Risk Assessment    Request for surgical clearance:  1. What type of surgery is being performed? Dental treatment for possible infection  2. When is this surgery scheduled? ASAP   3. What type of clearance is required (medical clearance vs. Pharmacy clearance to hold med vs. Both)? Both  4. Are there any medications that need to be held prior to surgery and how long?Can dentist provide dental treatment for possible infection with patient continuing on Plavix and Aspirin or does he need to hold these before treatment is initiated   5. Practice name and name of physician performing surgery? Dr. Leafy Kindle. Pope, DDS   6. What is your office phone and fax number? P: [phone]  F: [phone]   7. Anesthesia type (None, local, MAC, general) ? None Indicated    Emmaline Life 01/28/2018, 4:14 PM  _________________________________________________________________   (provider comments below)

## 2018-01-29 ENCOUNTER — Telehealth: Payer: Self-pay | Admitting: Cardiovascular Disease

## 2018-01-29 NOTE — Telephone Encounter (Signed)
F/U Call   Mrs. Laventure calling, states that patient is scheduled to have dental work completed this afternoon. Patient already received clearance but the dental office (Dr. Cindra Eves office) has not received the clearance.  Mrs. Gabrielsen would like you to  fax the clearance again  to [phone] since patient is scheduled for this afternoon.

## 2018-01-29 NOTE — Telephone Encounter (Signed)
Re faxed via EPIC. Clarified fax received by dentist office. Informed wife that fax sent & received. She is appreciative of this help.

## 2018-02-07 DIAGNOSIS — E1165 Type 2 diabetes mellitus with hyperglycemia: Secondary | ICD-10-CM | POA: Diagnosis not present

## 2018-02-18 DIAGNOSIS — E1129 Type 2 diabetes mellitus with other diabetic kidney complication: Secondary | ICD-10-CM | POA: Diagnosis not present

## 2018-02-20 DIAGNOSIS — E113313 Type 2 diabetes mellitus with moderate nonproliferative diabetic retinopathy with macular edema, bilateral: Secondary | ICD-10-CM | POA: Diagnosis not present

## 2018-02-26 DIAGNOSIS — E113312 Type 2 diabetes mellitus with moderate nonproliferative diabetic retinopathy with macular edema, left eye: Secondary | ICD-10-CM | POA: Diagnosis not present

## 2018-02-26 DIAGNOSIS — E113311 Type 2 diabetes mellitus with moderate nonproliferative diabetic retinopathy with macular edema, right eye: Secondary | ICD-10-CM | POA: Diagnosis not present

## 2018-03-07 DIAGNOSIS — Z794 Long term (current) use of insulin: Secondary | ICD-10-CM | POA: Diagnosis not present

## 2018-03-07 DIAGNOSIS — E119 Type 2 diabetes mellitus without complications: Secondary | ICD-10-CM | POA: Diagnosis not present

## 2018-03-21 DIAGNOSIS — I1310 Hypertensive heart and chronic kidney disease without heart failure, with stage 1 through stage 4 chronic kidney disease, or unspecified chronic kidney disease: Secondary | ICD-10-CM | POA: Diagnosis not present

## 2018-03-21 DIAGNOSIS — E11319 Type 2 diabetes mellitus with unspecified diabetic retinopathy without macular edema: Secondary | ICD-10-CM | POA: Diagnosis not present

## 2018-03-21 DIAGNOSIS — E1165 Type 2 diabetes mellitus with hyperglycemia: Secondary | ICD-10-CM | POA: Diagnosis not present

## 2018-03-21 DIAGNOSIS — E108 Type 1 diabetes mellitus with unspecified complications: Secondary | ICD-10-CM | POA: Diagnosis not present

## 2018-03-22 DIAGNOSIS — E1129 Type 2 diabetes mellitus with other diabetic kidney complication: Secondary | ICD-10-CM | POA: Diagnosis not present

## 2018-03-29 DIAGNOSIS — I10 Essential (primary) hypertension: Secondary | ICD-10-CM | POA: Diagnosis not present

## 2018-03-29 DIAGNOSIS — Z794 Long term (current) use of insulin: Secondary | ICD-10-CM | POA: Diagnosis not present

## 2018-03-29 DIAGNOSIS — N183 Chronic kidney disease, stage 3 (moderate): Secondary | ICD-10-CM | POA: Diagnosis not present

## 2018-03-29 DIAGNOSIS — E1165 Type 2 diabetes mellitus with hyperglycemia: Secondary | ICD-10-CM | POA: Diagnosis not present

## 2018-04-02 DIAGNOSIS — E113311 Type 2 diabetes mellitus with moderate nonproliferative diabetic retinopathy with macular edema, right eye: Secondary | ICD-10-CM | POA: Diagnosis not present

## 2018-04-02 DIAGNOSIS — E113312 Type 2 diabetes mellitus with moderate nonproliferative diabetic retinopathy with macular edema, left eye: Secondary | ICD-10-CM | POA: Diagnosis not present

## 2018-04-03 DIAGNOSIS — E119 Type 2 diabetes mellitus without complications: Secondary | ICD-10-CM | POA: Diagnosis not present

## 2018-04-03 DIAGNOSIS — Z794 Long term (current) use of insulin: Secondary | ICD-10-CM | POA: Diagnosis not present

## 2018-04-16 DIAGNOSIS — Z794 Long term (current) use of insulin: Secondary | ICD-10-CM | POA: Diagnosis not present

## 2018-04-16 DIAGNOSIS — E119 Type 2 diabetes mellitus without complications: Secondary | ICD-10-CM | POA: Diagnosis not present

## 2018-04-16 DIAGNOSIS — E113312 Type 2 diabetes mellitus with moderate nonproliferative diabetic retinopathy with macular edema, left eye: Secondary | ICD-10-CM | POA: Diagnosis not present

## 2018-04-16 DIAGNOSIS — E113311 Type 2 diabetes mellitus with moderate nonproliferative diabetic retinopathy with macular edema, right eye: Secondary | ICD-10-CM | POA: Diagnosis not present

## 2018-04-25 DIAGNOSIS — E1129 Type 2 diabetes mellitus with other diabetic kidney complication: Secondary | ICD-10-CM | POA: Diagnosis not present

## 2018-05-22 ENCOUNTER — Ambulatory Visit: Payer: Medicare Other | Admitting: Internal Medicine

## 2018-06-01 DIAGNOSIS — E1165 Type 2 diabetes mellitus with hyperglycemia: Secondary | ICD-10-CM | POA: Diagnosis not present

## 2018-06-04 DIAGNOSIS — E113312 Type 2 diabetes mellitus with moderate nonproliferative diabetic retinopathy with macular edema, left eye: Secondary | ICD-10-CM | POA: Diagnosis not present

## 2018-06-04 DIAGNOSIS — E113311 Type 2 diabetes mellitus with moderate nonproliferative diabetic retinopathy with macular edema, right eye: Secondary | ICD-10-CM | POA: Diagnosis not present

## 2018-06-06 ENCOUNTER — Ambulatory Visit: Payer: Medicare Other | Admitting: Internal Medicine

## 2018-06-25 DIAGNOSIS — E113312 Type 2 diabetes mellitus with moderate nonproliferative diabetic retinopathy with macular edema, left eye: Secondary | ICD-10-CM | POA: Diagnosis not present

## 2018-06-25 DIAGNOSIS — E113311 Type 2 diabetes mellitus with moderate nonproliferative diabetic retinopathy with macular edema, right eye: Secondary | ICD-10-CM | POA: Diagnosis not present

## 2018-07-03 ENCOUNTER — Ambulatory Visit: Payer: Medicare Other | Admitting: Gastroenterology

## 2018-07-03 DIAGNOSIS — E1165 Type 2 diabetes mellitus with hyperglycemia: Secondary | ICD-10-CM | POA: Diagnosis not present

## 2018-07-09 DIAGNOSIS — E113312 Type 2 diabetes mellitus with moderate nonproliferative diabetic retinopathy with macular edema, left eye: Secondary | ICD-10-CM | POA: Diagnosis not present

## 2018-07-17 DIAGNOSIS — E119 Type 2 diabetes mellitus without complications: Secondary | ICD-10-CM | POA: Diagnosis not present

## 2018-07-17 DIAGNOSIS — Z794 Long term (current) use of insulin: Secondary | ICD-10-CM | POA: Diagnosis not present

## 2018-07-20 DIAGNOSIS — Z794 Long term (current) use of insulin: Secondary | ICD-10-CM | POA: Diagnosis not present

## 2018-07-20 DIAGNOSIS — E119 Type 2 diabetes mellitus without complications: Secondary | ICD-10-CM | POA: Diagnosis not present

## 2018-07-24 DIAGNOSIS — E11319 Type 2 diabetes mellitus with unspecified diabetic retinopathy without macular edema: Secondary | ICD-10-CM | POA: Diagnosis not present

## 2018-07-24 DIAGNOSIS — Z4681 Encounter for fitting and adjustment of insulin pump: Secondary | ICD-10-CM | POA: Diagnosis not present

## 2018-07-24 DIAGNOSIS — E1165 Type 2 diabetes mellitus with hyperglycemia: Secondary | ICD-10-CM | POA: Diagnosis not present

## 2018-07-24 DIAGNOSIS — N183 Chronic kidney disease, stage 3 (moderate): Secondary | ICD-10-CM | POA: Diagnosis not present

## 2018-08-05 DIAGNOSIS — E1165 Type 2 diabetes mellitus with hyperglycemia: Secondary | ICD-10-CM | POA: Diagnosis not present

## 2018-08-06 DIAGNOSIS — E113312 Type 2 diabetes mellitus with moderate nonproliferative diabetic retinopathy with macular edema, left eye: Secondary | ICD-10-CM | POA: Diagnosis not present

## 2018-08-06 DIAGNOSIS — E113311 Type 2 diabetes mellitus with moderate nonproliferative diabetic retinopathy with macular edema, right eye: Secondary | ICD-10-CM | POA: Diagnosis not present

## 2018-08-12 DIAGNOSIS — E119 Type 2 diabetes mellitus without complications: Secondary | ICD-10-CM | POA: Diagnosis not present

## 2018-08-12 DIAGNOSIS — Z794 Long term (current) use of insulin: Secondary | ICD-10-CM | POA: Diagnosis not present

## 2018-08-14 ENCOUNTER — Ambulatory Visit: Payer: Medicare Other | Admitting: Internal Medicine

## 2018-08-14 ENCOUNTER — Encounter: Payer: Self-pay | Admitting: Internal Medicine

## 2018-08-14 VITALS — BP 150/82 | HR 72 | Ht 71.0 in | Wt 233.0 lb

## 2018-08-14 DIAGNOSIS — Z7902 Long term (current) use of antithrombotics/antiplatelets: Secondary | ICD-10-CM

## 2018-08-14 DIAGNOSIS — Z794 Long term (current) use of insulin: Secondary | ICD-10-CM

## 2018-08-14 DIAGNOSIS — H524 Presbyopia: Secondary | ICD-10-CM | POA: Diagnosis not present

## 2018-08-14 DIAGNOSIS — Z86010 Personal history of colonic polyps: Secondary | ICD-10-CM

## 2018-08-14 DIAGNOSIS — E119 Type 2 diabetes mellitus without complications: Secondary | ICD-10-CM | POA: Diagnosis not present

## 2018-08-14 DIAGNOSIS — IMO0001 Reserved for inherently not codable concepts without codable children: Secondary | ICD-10-CM

## 2018-08-14 MED ORDER — NA SULFATE-K SULFATE-MG SULF 17.5-3.13-1.6 GM/177ML PO SOLN: 1.0000 | Freq: Once | ORAL | 0 refills | Status: AC

## 2018-08-14 NOTE — Progress Notes (Signed)
HISTORY OF PRESENT ILLNESS:  Jason Giles is a 72 y.o. male with multiple medical problems including hypertension, hyperlipidemia, insulin requiring diabetes mellitus on insulin pump, coronary artery disease status post coronary artery stent placement on chronic aspirin and Plavix therapy, and multiple adenomatous colon polyps.  He presents today regarding surveillance colonoscopy.  Patient's last examination was January 2016.  That is the last time he has been seen in this facility.  At that time he was found to have 6 polyps which were adenomatous and sessile serrated polyps.  The examination was performed off Plavix after conferring with his cardiologist.  Repeat colonoscopy in 3 years recommended.  Patient has been stable since.  No cardiac issues.  He continues on his medications.  GI review of systems is essentially negative.  Review of outside laboratories finds normal hemoglobin.  His last hemoglobin A1c was 7.2.  No relevant radiology  REVIEW OF SYSTEMS:  All non-GI ROS negative unless otherwise stated in the HPI except for arthritis  Past Medical History:  Diagnosis Date  . Arthritis   . Colon polyp    adenomatous  . Coronary artery disease    post PTCA and stenting  . CORONARY ATHEROSCLEROSIS NATIVE CORONARY ARTERY 12/21/2009  . Diabetes mellitus   . Diverticulosis   . Hyperlipidemia   . Hypertension   . Kidney stones     Past Surgical History:  Procedure Laterality Date  . CORONARY ANGIOPLASTY WITH STENT PLACEMENT     left circumflex artery    Social History Jason Giles  reports that he quit smoking about 32 years ago. He has never used smokeless tobacco. He reports that he drinks alcohol. He reports that he does not use drugs.  family history includes Cancer in his father; Colon cancer in his mother.  No Known Allergies     PHYSICAL EXAMINATION: Vital signs: BP (!) 150/82   Pulse 72   Ht 5\' 11"  (1.803 m)   Wt 233 lb (105.7 kg)   BMI 32.50 kg/m    Constitutional: Pleasant, generally well-appearing, no acute distress Psychiatric: alert and oriented x3, cooperative Eyes: extraocular movements intact, anicteric, conjunctiva pink Mouth: oral pharynx moist, no lesions Neck: supple no lymphadenopathy Cardiovascular: heart regular rate and rhythm, no murmur Lungs: clear to auscultation bilaterally Abdomen: soft, nontender, nondistended, no obvious ascites, no peritoneal signs, normal bowel sounds, no organomegaly Rectal: Deferred until colonoscopy Extremities: no clubbing, cyanosis, or lower extremity edema bilaterally Skin: no lesions on visible extremities Neuro: No focal deficits.  Cranial nerves intact  ASSESSMENT:  1.  History of multiple adenomatous colon polyps.  Due for surveillance 2.  Multiple medical problems including coronary artery disease on aspirin and Plavix.  Stable 3.  Insulin requiring diabetes mellitus   PLAN:  1.  Surveillance colonoscopy.  The patient is high risk given his coronary artery disease, chronic Plavix therapy, and insulin requiring diabetes mellitus.The nature of the procedure, as well as the risks, benefits, and alternatives were carefully and thoroughly reviewed with the patient. Ample time for discussion and questions allowed. The patient understood, was satisfied, and agreed to proceed. 2.  As previous, I have recommended that he hold his Plavix 5 days prior to the examination but continue on aspirin throughout 3.  The patient will adjust his insulin pump in an effort to avoid unwanted hypoglycemia on the day of his procedure 4.  Ongoing general medical care with Dr. Osborne Casco

## 2018-08-14 NOTE — Patient Instructions (Signed)

## 2018-08-15 DIAGNOSIS — E113312 Type 2 diabetes mellitus with moderate nonproliferative diabetic retinopathy with macular edema, left eye: Secondary | ICD-10-CM | POA: Diagnosis not present

## 2018-09-03 ENCOUNTER — Telehealth: Payer: Self-pay | Admitting: Internal Medicine

## 2018-09-03 DIAGNOSIS — D485 Neoplasm of uncertain behavior of skin: Secondary | ICD-10-CM | POA: Diagnosis not present

## 2018-09-03 DIAGNOSIS — L821 Other seborrheic keratosis: Secondary | ICD-10-CM | POA: Diagnosis not present

## 2018-09-03 DIAGNOSIS — C44612 Basal cell carcinoma of skin of right upper limb, including shoulder: Secondary | ICD-10-CM | POA: Diagnosis not present

## 2018-09-03 DIAGNOSIS — C44712 Basal cell carcinoma of skin of right lower limb, including hip: Secondary | ICD-10-CM | POA: Diagnosis not present

## 2018-09-03 DIAGNOSIS — L570 Actinic keratosis: Secondary | ICD-10-CM | POA: Diagnosis not present

## 2018-09-03 DIAGNOSIS — Z85828 Personal history of other malignant neoplasm of skin: Secondary | ICD-10-CM | POA: Diagnosis not present

## 2018-09-03 NOTE — Telephone Encounter (Signed)
Patients wife states insurance did not cover suprep and it was over $100. Patient wife wanting to know if they could get it cheaper or have something else called in. Pt had ov on 10.2.19

## 2018-09-04 NOTE — Telephone Encounter (Signed)
Called pt. Spoke to wife.  Leaving sample at front desk.  She will pick up tomorrow

## 2018-09-05 DIAGNOSIS — E1165 Type 2 diabetes mellitus with hyperglycemia: Secondary | ICD-10-CM | POA: Diagnosis not present

## 2018-09-05 DIAGNOSIS — E7800 Pure hypercholesterolemia, unspecified: Secondary | ICD-10-CM | POA: Diagnosis not present

## 2018-09-05 DIAGNOSIS — R82998 Other abnormal findings in urine: Secondary | ICD-10-CM | POA: Diagnosis not present

## 2018-09-05 DIAGNOSIS — Z125 Encounter for screening for malignant neoplasm of prostate: Secondary | ICD-10-CM | POA: Diagnosis not present

## 2018-09-05 DIAGNOSIS — N183 Chronic kidney disease, stage 3 (moderate): Secondary | ICD-10-CM | POA: Diagnosis not present

## 2018-09-05 DIAGNOSIS — E11319 Type 2 diabetes mellitus with unspecified diabetic retinopathy without macular edema: Secondary | ICD-10-CM | POA: Diagnosis not present

## 2018-09-10 ENCOUNTER — Encounter: Payer: Self-pay | Admitting: Internal Medicine

## 2018-09-12 DIAGNOSIS — E1165 Type 2 diabetes mellitus with hyperglycemia: Secondary | ICD-10-CM | POA: Diagnosis not present

## 2018-09-12 DIAGNOSIS — I10 Essential (primary) hypertension: Secondary | ICD-10-CM | POA: Diagnosis not present

## 2018-09-12 DIAGNOSIS — Z0000 Encounter for general adult medical examination without abnormal findings: Secondary | ICD-10-CM | POA: Diagnosis not present

## 2018-09-12 DIAGNOSIS — E11319 Type 2 diabetes mellitus with unspecified diabetic retinopathy without macular edema: Secondary | ICD-10-CM | POA: Diagnosis not present

## 2018-09-24 ENCOUNTER — Encounter: Payer: Self-pay | Admitting: Internal Medicine

## 2018-09-24 ENCOUNTER — Ambulatory Visit (AMBULATORY_SURGERY_CENTER): Payer: Medicare Other | Admitting: Internal Medicine

## 2018-09-24 VITALS — BP 150/89 | HR 71 | Temp 97.5°F | Resp 13 | Ht 71.0 in | Wt 233.0 lb

## 2018-09-24 DIAGNOSIS — E119 Type 2 diabetes mellitus without complications: Secondary | ICD-10-CM | POA: Diagnosis not present

## 2018-09-24 DIAGNOSIS — Z86010 Personal history of colonic polyps: Secondary | ICD-10-CM | POA: Diagnosis not present

## 2018-09-24 DIAGNOSIS — K635 Polyp of colon: Secondary | ICD-10-CM | POA: Diagnosis not present

## 2018-09-24 DIAGNOSIS — I2510 Atherosclerotic heart disease of native coronary artery without angina pectoris: Secondary | ICD-10-CM | POA: Diagnosis not present

## 2018-09-24 DIAGNOSIS — D125 Benign neoplasm of sigmoid colon: Secondary | ICD-10-CM

## 2018-09-24 DIAGNOSIS — I10 Essential (primary) hypertension: Secondary | ICD-10-CM | POA: Diagnosis not present

## 2018-09-24 MED ORDER — DEXTROSE 50 % IV SOLN
25.0000 mL | Freq: Once | INTRAVENOUS | Status: AC
  Administered 2018-09-24: 25 mL via INTRAVENOUS

## 2018-09-24 MED ORDER — SODIUM CHLORIDE 0.9 % IV SOLN: 500.0000 mL | Freq: Once | INTRAVENOUS | Status: DC

## 2018-09-24 NOTE — Progress Notes (Signed)
1330 blood glucose checked, 50. Patient is still sedated , skin warm dry and pink. Patient stating his pump is suspended. Discussed with Dr Henrene Pastor, d50, 25 g given (1337)patient now awake.  1350 blood glucose 109, informed dr Henrene Pastor. Patient will turn back on once he has eaten. Patient is asymptomatic and wife and patient verbalize understanding.

## 2018-09-24 NOTE — Op Note (Signed)
[facility] Patient Name: Jason Giles Procedure Date: 09/24/2018 12:52 PM MRN: [id number] Endoscopist: Docia Chuck. Henrene Pastor , MD Age: 72 Referring MD:  Date of Birth: 1946/03/19 Gender: Male Account #: [id number] Procedure:                Colonoscopy cold snare polypectomy x 1 Indications:              High risk colon cancer surveillance: Personal                            history of colonic polyps. Last examination January                            2016 was 6 polyps (tubular adenomas and sessile                            serrated polyps).. Medicines:                Monitored Anesthesia Care Procedure:                Pre-Anesthesia Assessment:                           - Prior to the procedure, a History and Physical                            was performed, and patient medications and                            allergies were reviewed. The patient's tolerance of                            previous anesthesia was also reviewed. The risks                            and benefits of the procedure and the sedation                            options and risks were discussed with the patient.                            All questions were answered, and informed consent                            was obtained. Prior Anticoagulants: The patient has                            taken Plavix (clopidogrel), last dose was 5 days                            prior to procedure. ASA Grade Assessment: III - A                            patient with severe systemic disease. After  reviewing the risks and benefits, the patient was                            deemed in satisfactory condition to undergo the                            procedure.                           After obtaining informed consent, the colonoscope                            was passed under direct vision. Throughout the                            procedure, the patient's blood pressure, pulse, and                         oxygen saturations were monitored continuously. The                            Colonoscope was introduced through the anus and                            advanced to the the cecum, identified by                            appendiceal orifice and ileocecal valve. The                            ileocecal valve, appendiceal orifice, and rectum                            were photographed. The quality of the bowel                            preparation was adequate to identify polyps 6 mm                            and larger in size. Dense adherent stool in the                            right colon limited visualization in that                            particular area. The colonoscopy was performed                            without difficulty. The patient tolerated the                            procedure well. The bowel preparation used was                            SUPREP.  Scope In: 1:10:14 PM Scope Out: 1:27:08 PM Scope Withdrawal Time: 0 hours 13 minutes 58 seconds  Total Procedure Duration: 0 hours 16 minutes 54 seconds  Findings:                 A 6 mm polyp was found in the sigmoid colon. The                            polyp was pedunculated. The polyp was removed with                            a cold snare. Resection and retrieval were complete.                           The exam was otherwise without abnormality on                            direct and retroflexion views. Complications:            No immediate complications. Estimated blood loss:                            None. Estimated Blood Loss:     Estimated blood loss: none. Impression:               - One 6 mm polyp in the sigmoid colon, removed with                            a cold snare. Resected and retrieved.                           - The examination was otherwise normal on direct                            and retroflexion views. Recommendation:           - Repeat colonoscopy in 3 years  for surveillance.                            MORE EXTENSIVE PREPARATION.                           - Resume Plavix (clopidogrel) today at prior dose.                           - Patient has a contact number available for                            emergencies. The signs and symptoms of potential                            delayed complications were discussed with the                            patient. Return to normal activities tomorrow.  Written discharge instructions were provided to the                            patient.                           - Resume previous diet.                           - Continue present medications.                           - Await pathology results. Docia Chuck. Henrene Pastor, MD 09/24/2018 1:37:50 PM This report has been signed electronically.

## 2018-09-24 NOTE — Patient Instructions (Signed)
Resume your Plavix today at your regular dose. Handout given : polyps   YOU HAD AN ENDOSCOPIC PROCEDURE TODAY AT [facility]:   Refer to the procedure report that was given to you for any specific questions about what was found during the examination.  If the procedure report does not answer your questions, please call your gastroenterologist to clarify.  If you requested that your care partner not be given the details of your procedure findings, then the procedure report has been included in a sealed envelope for you to review at your convenience later.  YOU SHOULD EXPECT: Some feelings of bloating in the abdomen. Passage of more gas than usual.  Walking can help get rid of the air that was put into your GI tract during the procedure and reduce the bloating. If you had a lower endoscopy (such as a colonoscopy or flexible sigmoidoscopy) you may notice spotting of blood in your stool or on the toilet paper. If you underwent a bowel prep for your procedure, you may not have a normal bowel movement for a few days.  Please Note:  You might notice some irritation and congestion in your nose or some drainage.  This is from the oxygen used during your procedure.  There is no need for concern and it should clear up in a day or so.  SYMPTOMS TO REPORT IMMEDIATELY:   Following lower endoscopy (colonoscopy or flexible sigmoidoscopy):  Excessive amounts of blood in the stool  Significant tenderness or worsening of abdominal pains  Swelling of the abdomen that is new, acute  Fever of 100F or higher   For urgent or emergent issues, a gastroenterologist can be reached at any hour by calling [phone].   DIET:  We do recommend a small meal at first, but then you may proceed to your regular diet.  Drink plenty of fluids but you should avoid alcoholic beverages for 24 hours.  ACTIVITY:  You should plan to take it easy for the rest of today and you should NOT DRIVE or use heavy machinery  until tomorrow (because of the sedation medicines used during the test).    FOLLOW UP: Our staff will call the number listed on your records the next business day following your procedure to check on you and address any questions or concerns that you may have regarding the information given to you following your procedure. If we do not reach you, we will leave a message.  However, if you are feeling well and you are not experiencing any problems, there is no need to return our call.  We will assume that you have returned to your regular daily activities without incident.  If any biopsies were taken you will be contacted by phone or by letter within the next 1-3 weeks.  Please call us at [phone] if you have not heard about the biopsies in 3 weeks.    SIGNATURES/CONFIDENTIALITY: You and/or your care partner have signed paperwork which will be entered into your electronic medical record.  These signatures attest to the fact that that the information above on your After Visit Summary has been reviewed and is understood.  Full responsibility of the confidentiality of this discharge information lies with you and/or your care-partner.

## 2018-09-24 NOTE — Progress Notes (Signed)
Called to room to assist during endoscopic procedure.  Patient ID and intended procedure confirmed with present staff. Received instructions for my participation in the procedure from the performing physician.  

## 2018-09-24 NOTE — Progress Notes (Signed)
Report to PACU, RN, vss, BBS= Clear.  

## 2018-09-25 ENCOUNTER — Telehealth: Payer: Self-pay

## 2018-09-25 DIAGNOSIS — E113311 Type 2 diabetes mellitus with moderate nonproliferative diabetic retinopathy with macular edema, right eye: Secondary | ICD-10-CM | POA: Diagnosis not present

## 2018-09-25 DIAGNOSIS — E113312 Type 2 diabetes mellitus with moderate nonproliferative diabetic retinopathy with macular edema, left eye: Secondary | ICD-10-CM | POA: Diagnosis not present

## 2018-09-25 NOTE — Telephone Encounter (Signed)
  Follow up Call-  Call back number 09/24/2018  Post procedure Call Back phone  # [phone] wife's cell  Permission to leave phone message Yes  Some recent data might be hidden     Patient questions:  Do you have a fever, pain , or abdominal swelling? No. Pain Score  0 *  Have you tolerated food without any problems? Yes.    Have you been able to return to your normal activities? Yes.    Do you have any questions about your discharge instructions: Diet   No. Medications  No. Follow up visit  No.  Do you have questions or concerns about your Care? No.  Actions: * If pain score is 4 or above: No action needed, pain <4.  Pt reported he did have blood from his rectum last night.  He has not seen any blood in his underwear this am.  I asked him to call us if bleeding is more than a small amount.  Pt said he will call.  maw

## 2018-09-30 ENCOUNTER — Encounter: Payer: Self-pay | Admitting: Internal Medicine

## 2018-10-01 DIAGNOSIS — E113312 Type 2 diabetes mellitus with moderate nonproliferative diabetic retinopathy with macular edema, left eye: Secondary | ICD-10-CM | POA: Diagnosis not present

## 2018-10-01 DIAGNOSIS — E113311 Type 2 diabetes mellitus with moderate nonproliferative diabetic retinopathy with macular edema, right eye: Secondary | ICD-10-CM | POA: Diagnosis not present

## 2018-10-09 DIAGNOSIS — E1165 Type 2 diabetes mellitus with hyperglycemia: Secondary | ICD-10-CM | POA: Diagnosis not present

## 2018-10-18 DIAGNOSIS — E119 Type 2 diabetes mellitus without complications: Secondary | ICD-10-CM | POA: Diagnosis not present

## 2018-10-18 DIAGNOSIS — Z794 Long term (current) use of insulin: Secondary | ICD-10-CM | POA: Diagnosis not present

## 2018-10-21 ENCOUNTER — Ambulatory Visit: Payer: Medicare Other | Admitting: Cardiovascular Disease

## 2018-10-21 ENCOUNTER — Encounter: Payer: Self-pay | Admitting: Cardiovascular Disease

## 2018-10-21 VITALS — BP 130/86 | HR 73 | Ht 71.0 in | Wt 234.0 lb

## 2018-10-21 DIAGNOSIS — E782 Mixed hyperlipidemia: Secondary | ICD-10-CM | POA: Diagnosis not present

## 2018-10-21 DIAGNOSIS — I2510 Atherosclerotic heart disease of native coronary artery without angina pectoris: Secondary | ICD-10-CM

## 2018-10-21 NOTE — Patient Instructions (Signed)

## 2018-10-21 NOTE — Progress Notes (Signed)
Darrow Bussing Date of Birth  02/27/1946 [facility] [address]     [place],   [address] [phone]  Fax  [phone]  Problem List 1. CAD - stenting 2011 ( Taxus DES in May , 2012)  2. Diabetes Mellitus  3. Essential Hypertension     Previous Notes:   Pt is doing well.  Has retired since last visit. No chest pain .  He denies any angina or dyspnea.  Dec. 8.2015:  Landen is a 72 yo who I follow for CAD, HTN, hyperlipidemia.  He went to the old office today. No having any problems. Is scheduled to have a colonoscopy and cataract surgery.     Oct. 4, 2016:  Khamron is doing well.   No CP or dyspnea.   Has been having some left arm discomfort.   Noticed it when he got up from bed to use the bathroom.   No pain .   Possibly overworked it the day before .  Had done some heavy lifting the day of this discomfort. Has felt fine since.  Has been doing this normal activities since   Oct. 31, 2017:   Savalas is seen back for follow up of his coronary artery disease.  He has a history of essential hypertension and diabetes.  We discussed [name].   His brother was the tennis coach up in [place] and he knows Victoria .   No CP or dyspnea. Colman Cater to the dermatology - Basal cell CA on his nose Gets eye injections every 4 weeks  ( Macular degeneration )   Dec. 6, 2018:  Shean is seen today for a follow-up visit.  He has a history of coronary artery disease-status post stenting  Still having issues with his eyes  No CP , no dyspnea.    Dec. 9, 2019:  Doing well .    Hx of PCI  - no angina  Needs to work on [employer]. Today is 234 lbs.   Labs from Tisovec's office  Chol = 101 HDL = 31 LDL = 44 Trigs = 128.   Current Outpatient Medications on File Prior to Visit  Medication Sig Dispense Refill  . aspirin 81 MG tablet Take 81 mg by mouth daily.      Marland Kitchen BOOSTRIX 5-2.5-18.5 injection Inject as directed once.  0  . insulin  aspart (NOVOLOG) 100 UNIT/ML injection Inject 3.6 Units into the skin continuous. Pump 3.60 units    . JARDIANCE 25 MG TABS tablet Take 1 tablet by mouth daily.  3  . metFORMIN (GLUCOPHAGE) 850 MG tablet Take 850 mg by mouth daily.     . metoprolol succinate (TOPROL-XL) 25 MG 24 hr tablet Take 25 mg by mouth 2 (two) times daily.    . multivitamin (THERAGRAN) per tablet Take 1 tablet by mouth daily.      . nitroGLYCERIN (NITROSTAT) 0.4 MG SL tablet Place 1 tablet (0.4 mg total) under the tongue every 5 (five) minutes as needed for chest pain. 25 tablet 6  . PLAVIX 75 MG tablet Take 1 tablet by mouth Daily.    . rosuvastatin (CRESTOR) 40 MG tablet Take 40 mg by mouth daily.    . TRILIPIX 135 MG capsule Take 1 tablet by mouth Daily.     No current facility-administered medications on file prior to visit.     No Known Allergies  Past Medical History:  Diagnosis Date  . Arthritis   .  Colon polyp    adenomatous  . Coronary artery disease    post PTCA and stenting  . CORONARY ATHEROSCLEROSIS NATIVE CORONARY ARTERY 12/21/2009  . Diabetes mellitus   . Diverticulosis   . Hyperlipidemia   . Hypertension   . Kidney stones     Past Surgical History:  Procedure Laterality Date  . CORONARY ANGIOPLASTY WITH STENT PLACEMENT     left circumflex artery    Social History   Tobacco Use  Smoking Status Former Smoker  . Last attempt to quit: 11/13/1985  . Years since quitting: 32.9  Smokeless Tobacco Never Used    Social History   Substance and Sexual Activity  Alcohol Use Yes  . Alcohol/week: 0.0 standard drinks   Comment: Occassionally    Family History  Problem Relation Age of Onset  . Cancer Father        Brain Tumor  . Colon cancer Mother   . Colon polyps Neg Hx   . Kidney disease Neg Hx   . Diabetes Neg Hx   . Esophageal cancer Neg Hx     Reviw of Systems:  Reviewed in the HPI.  All other systems are negative.  Physical Exam: Blood pressure 130/86, pulse 73, height 5'  11" (1.803 m), weight 234 lb (106.1 kg), SpO2 93 %.  GEN:  Well nourished, well developed in no acute distress HEENT: Normal NECK: No JVD; No carotid bruits LYMPHATICS: No lymphadenopathy CARDIAC: RRR  RESPIRATORY:  Clear to auscultation without rales, wheezing or rhonchi  ABDOMEN: Soft, non-tender, non-distended MUSCULOSKELETAL:  No edema; No deformity  SKIN: Warm and dry NEUROLOGIC:  Alert and oriented x 3   ECG: October 21, 2018 Normal sinus rhythm at 73.  First-degree AV block.  Assessment / Plan:   1. CAD - stenting 2011 ( Taxus DES in May , 2012) -     Denies having any angina.  Continue current medications. Blood levels at his primary medical's office look great.  Continue current medications.   2. Diabetes Mellitus  -  Advised diet, exercise, weight loss.  3. Essential Hypertension  -      Mertie Moores, MD  10/21/2018 11:56 AM    [address],  [place], [place]  [address] Pager [phone] Phone: [phone]; Fax: [phone]

## 2018-10-24 DIAGNOSIS — H903 Sensorineural hearing loss, bilateral: Secondary | ICD-10-CM | POA: Diagnosis not present

## 2018-10-29 DIAGNOSIS — E113311 Type 2 diabetes mellitus with moderate nonproliferative diabetic retinopathy with macular edema, right eye: Secondary | ICD-10-CM | POA: Diagnosis not present

## 2018-10-29 DIAGNOSIS — E113312 Type 2 diabetes mellitus with moderate nonproliferative diabetic retinopathy with macular edema, left eye: Secondary | ICD-10-CM | POA: Diagnosis not present

## 2018-11-14 DIAGNOSIS — E1165 Type 2 diabetes mellitus with hyperglycemia: Secondary | ICD-10-CM | POA: Diagnosis not present

## 2018-11-19 DIAGNOSIS — E113391 Type 2 diabetes mellitus with moderate nonproliferative diabetic retinopathy without macular edema, right eye: Secondary | ICD-10-CM | POA: Diagnosis not present

## 2018-11-19 DIAGNOSIS — H43813 Vitreous degeneration, bilateral: Secondary | ICD-10-CM | POA: Diagnosis not present

## 2018-11-19 DIAGNOSIS — H35372 Puckering of macula, left eye: Secondary | ICD-10-CM | POA: Diagnosis not present

## 2018-11-19 DIAGNOSIS — E113312 Type 2 diabetes mellitus with moderate nonproliferative diabetic retinopathy with macular edema, left eye: Secondary | ICD-10-CM | POA: Diagnosis not present

## 2018-12-14 DIAGNOSIS — E1165 Type 2 diabetes mellitus with hyperglycemia: Secondary | ICD-10-CM | POA: Diagnosis not present

## 2019-01-07 DIAGNOSIS — E113312 Type 2 diabetes mellitus with moderate nonproliferative diabetic retinopathy with macular edema, left eye: Secondary | ICD-10-CM | POA: Diagnosis not present

## 2019-01-07 DIAGNOSIS — H43813 Vitreous degeneration, bilateral: Secondary | ICD-10-CM | POA: Diagnosis not present

## 2019-01-07 DIAGNOSIS — E113313 Type 2 diabetes mellitus with moderate nonproliferative diabetic retinopathy with macular edema, bilateral: Secondary | ICD-10-CM | POA: Diagnosis not present

## 2019-01-07 DIAGNOSIS — H35372 Puckering of macula, left eye: Secondary | ICD-10-CM | POA: Diagnosis not present

## 2019-01-09 DIAGNOSIS — Z0120 Encounter for dental examination and cleaning without abnormal findings: Secondary | ICD-10-CM | POA: Diagnosis not present

## 2019-01-15 DIAGNOSIS — E1165 Type 2 diabetes mellitus with hyperglycemia: Secondary | ICD-10-CM | POA: Diagnosis not present

## 2019-01-17 DIAGNOSIS — Z794 Long term (current) use of insulin: Secondary | ICD-10-CM | POA: Diagnosis not present

## 2019-01-17 DIAGNOSIS — E119 Type 2 diabetes mellitus without complications: Secondary | ICD-10-CM | POA: Diagnosis not present

## 2019-02-07 DIAGNOSIS — E113313 Type 2 diabetes mellitus with moderate nonproliferative diabetic retinopathy with macular edema, bilateral: Secondary | ICD-10-CM | POA: Diagnosis not present

## 2019-02-17 DIAGNOSIS — E1165 Type 2 diabetes mellitus with hyperglycemia: Secondary | ICD-10-CM | POA: Diagnosis not present

## 2019-03-11 DIAGNOSIS — E113313 Type 2 diabetes mellitus with moderate nonproliferative diabetic retinopathy with macular edema, bilateral: Secondary | ICD-10-CM | POA: Diagnosis not present

## 2019-03-20 DIAGNOSIS — E1165 Type 2 diabetes mellitus with hyperglycemia: Secondary | ICD-10-CM | POA: Diagnosis not present

## 2019-04-01 DIAGNOSIS — E1129 Type 2 diabetes mellitus with other diabetic kidney complication: Secondary | ICD-10-CM | POA: Diagnosis not present

## 2019-04-01 DIAGNOSIS — E11319 Type 2 diabetes mellitus with unspecified diabetic retinopathy without macular edema: Secondary | ICD-10-CM | POA: Diagnosis not present

## 2019-04-01 DIAGNOSIS — E1165 Type 2 diabetes mellitus with hyperglycemia: Secondary | ICD-10-CM | POA: Diagnosis not present

## 2019-04-01 DIAGNOSIS — E118 Type 2 diabetes mellitus with unspecified complications: Secondary | ICD-10-CM | POA: Diagnosis not present

## 2019-04-11 DIAGNOSIS — E113313 Type 2 diabetes mellitus with moderate nonproliferative diabetic retinopathy with macular edema, bilateral: Secondary | ICD-10-CM | POA: Diagnosis not present

## 2019-04-17 DIAGNOSIS — E119 Type 2 diabetes mellitus without complications: Secondary | ICD-10-CM | POA: Diagnosis not present

## 2019-04-17 DIAGNOSIS — Z794 Long term (current) use of insulin: Secondary | ICD-10-CM | POA: Diagnosis not present

## 2019-04-22 DIAGNOSIS — E1165 Type 2 diabetes mellitus with hyperglycemia: Secondary | ICD-10-CM | POA: Diagnosis not present

## 2019-05-01 DIAGNOSIS — E119 Type 2 diabetes mellitus without complications: Secondary | ICD-10-CM | POA: Diagnosis not present

## 2019-05-01 DIAGNOSIS — Z794 Long term (current) use of insulin: Secondary | ICD-10-CM | POA: Diagnosis not present

## 2019-05-09 DIAGNOSIS — H43813 Vitreous degeneration, bilateral: Secondary | ICD-10-CM | POA: Diagnosis not present

## 2019-05-09 DIAGNOSIS — E113313 Type 2 diabetes mellitus with moderate nonproliferative diabetic retinopathy with macular edema, bilateral: Secondary | ICD-10-CM | POA: Diagnosis not present

## 2019-05-09 DIAGNOSIS — H35372 Puckering of macula, left eye: Secondary | ICD-10-CM | POA: Diagnosis not present

## 2019-05-21 DIAGNOSIS — E1165 Type 2 diabetes mellitus with hyperglycemia: Secondary | ICD-10-CM | POA: Diagnosis not present

## 2019-06-06 DIAGNOSIS — H43813 Vitreous degeneration, bilateral: Secondary | ICD-10-CM | POA: Diagnosis not present

## 2019-06-06 DIAGNOSIS — E113313 Type 2 diabetes mellitus with moderate nonproliferative diabetic retinopathy with macular edema, bilateral: Secondary | ICD-10-CM | POA: Diagnosis not present

## 2019-06-06 DIAGNOSIS — H35372 Puckering of macula, left eye: Secondary | ICD-10-CM | POA: Diagnosis not present

## 2019-06-13 ENCOUNTER — Emergency Department (HOSPITAL_COMMUNITY): Payer: Medicare Other

## 2019-06-13 ENCOUNTER — Observation Stay (HOSPITAL_COMMUNITY)
Admission: EM | Admit: 2019-06-13 | Discharge: 2019-06-14 | Disposition: A | Discharge status: Home / Self Care | Payer: Medicare Other | Attending: Internal Medicine | Admitting: Internal Medicine

## 2019-06-13 ENCOUNTER — Other Ambulatory Visit: Payer: Self-pay

## 2019-06-13 ENCOUNTER — Encounter (HOSPITAL_COMMUNITY): Payer: Self-pay

## 2019-06-13 DIAGNOSIS — J90 Pleural effusion, not elsewhere classified: Secondary | ICD-10-CM | POA: Diagnosis not present

## 2019-06-13 DIAGNOSIS — K5790 Diverticulosis of intestine, part unspecified, without perforation or abscess without bleeding: Secondary | ICD-10-CM | POA: Insufficient documentation

## 2019-06-13 DIAGNOSIS — Z6830 Body mass index (BMI) 30.0-30.9, adult: Secondary | ICD-10-CM | POA: Diagnosis not present

## 2019-06-13 DIAGNOSIS — I440 Atrioventricular block, first degree: Secondary | ICD-10-CM | POA: Insufficient documentation

## 2019-06-13 DIAGNOSIS — R2689 Other abnormalities of gait and mobility: Secondary | ICD-10-CM | POA: Diagnosis not present

## 2019-06-13 DIAGNOSIS — I2510 Atherosclerotic heart disease of native coronary artery without angina pectoris: Secondary | ICD-10-CM | POA: Diagnosis not present

## 2019-06-13 DIAGNOSIS — Z79899 Other long term (current) drug therapy: Secondary | ICD-10-CM | POA: Diagnosis not present

## 2019-06-13 DIAGNOSIS — E782 Mixed hyperlipidemia: Secondary | ICD-10-CM | POA: Diagnosis not present

## 2019-06-13 DIAGNOSIS — E785 Hyperlipidemia, unspecified: Secondary | ICD-10-CM | POA: Insufficient documentation

## 2019-06-13 DIAGNOSIS — I129 Hypertensive chronic kidney disease with stage 1 through stage 4 chronic kidney disease, or unspecified chronic kidney disease: Secondary | ICD-10-CM | POA: Diagnosis not present

## 2019-06-13 DIAGNOSIS — Z794 Long term (current) use of insulin: Secondary | ICD-10-CM | POA: Diagnosis not present

## 2019-06-13 DIAGNOSIS — M1990 Unspecified osteoarthritis, unspecified site: Secondary | ICD-10-CM | POA: Diagnosis not present

## 2019-06-13 DIAGNOSIS — Z7982 Long term (current) use of aspirin: Secondary | ICD-10-CM | POA: Diagnosis not present

## 2019-06-13 DIAGNOSIS — Z955 Presence of coronary angioplasty implant and graft: Secondary | ICD-10-CM | POA: Diagnosis not present

## 2019-06-13 DIAGNOSIS — Z9641 Presence of insulin pump (external) (internal): Secondary | ICD-10-CM | POA: Diagnosis not present

## 2019-06-13 DIAGNOSIS — R0902 Hypoxemia: Secondary | ICD-10-CM | POA: Diagnosis not present

## 2019-06-13 DIAGNOSIS — I499 Cardiac arrhythmia, unspecified: Secondary | ICD-10-CM | POA: Diagnosis not present

## 2019-06-13 DIAGNOSIS — Z87891 Personal history of nicotine dependence: Secondary | ICD-10-CM | POA: Diagnosis not present

## 2019-06-13 DIAGNOSIS — E669 Obesity, unspecified: Secondary | ICD-10-CM | POA: Insufficient documentation

## 2019-06-13 DIAGNOSIS — N183 Chronic kidney disease, stage 3 (moderate): Secondary | ICD-10-CM | POA: Diagnosis not present

## 2019-06-13 DIAGNOSIS — R55 Syncope and collapse: Secondary | ICD-10-CM | POA: Diagnosis not present

## 2019-06-13 DIAGNOSIS — E1122 Type 2 diabetes mellitus with diabetic chronic kidney disease: Secondary | ICD-10-CM | POA: Insufficient documentation

## 2019-06-13 DIAGNOSIS — E119 Type 2 diabetes mellitus without complications: Secondary | ICD-10-CM | POA: Diagnosis not present

## 2019-06-13 DIAGNOSIS — Z7902 Long term (current) use of antithrombotics/antiplatelets: Secondary | ICD-10-CM | POA: Diagnosis not present

## 2019-06-13 DIAGNOSIS — I951 Orthostatic hypotension: Secondary | ICD-10-CM

## 2019-06-13 DIAGNOSIS — Z03818 Encounter for observation for suspected exposure to other biological agents ruled out: Secondary | ICD-10-CM | POA: Diagnosis not present

## 2019-06-13 DIAGNOSIS — Z1159 Encounter for screening for other viral diseases: Secondary | ICD-10-CM | POA: Diagnosis not present

## 2019-06-13 DIAGNOSIS — I10 Essential (primary) hypertension: Secondary | ICD-10-CM | POA: Diagnosis not present

## 2019-06-13 LAB — GLUCOSE, CAPILLARY
Glucose-Capillary: 58 mg/dL — ABNORMAL LOW (ref 70–99)
Glucose-Capillary: 65 mg/dL — ABNORMAL LOW (ref 70–99)
Glucose-Capillary: 147 mg/dL — ABNORMAL HIGH (ref 70–99)
Glucose-Capillary: 119 mg/dL — ABNORMAL HIGH (ref 70–99)

## 2019-06-13 LAB — CBC WITH DIFFERENTIAL/PLATELET
Abs Immature Granulocytes: 0.04 10*3/uL (ref 0.00–0.07)
Basophils Absolute: 0 10*3/uL (ref 0.0–0.1)
Basophils Relative: 0 %
Eosinophils Absolute: 0.2 10*3/uL (ref 0.0–0.5)
Eosinophils Relative: 2 %
HCT: 50.9 % (ref 39.0–52.0)
Hemoglobin: 16.6 g/dL (ref 13.0–17.0)
Immature Granulocytes: 0 %
Lymphocytes Relative: 13 %
Lymphs Abs: 1.4 10*3/uL (ref 0.7–4.0)
MCH: 30.9 pg (ref 26.0–34.0)
MCHC: 32.6 g/dL (ref 30.0–36.0)
MCV: 94.6 fL (ref 80.0–100.0)
Monocytes Absolute: 1 10*3/uL (ref 0.1–1.0)
Monocytes Relative: 9 %
Neutro Abs: 8 10*3/uL — ABNORMAL HIGH (ref 1.7–7.7)
Neutrophils Relative %: 76 %
Platelets: 210 10*3/uL (ref 150–400)
RBC: 5.38 MIL/uL (ref 4.22–5.81)
RDW: 13 % (ref 11.5–15.5)
WBC: 10.6 10*3/uL — ABNORMAL HIGH (ref 4.0–10.5)
nRBC: 0 % (ref 0.0–0.2)

## 2019-06-13 LAB — URINALYSIS, ROUTINE W REFLEX MICROSCOPIC
Bacteria, UA: NONE SEEN
Bilirubin Urine: NEGATIVE
Glucose, UA: >=500 mg/dL — AB
Hgb urine dipstick: NEGATIVE
Ketones, ur: NEGATIVE mg/dL
Leukocytes,Ua: NEGATIVE
Nitrite: NEGATIVE
Protein, ur: NEGATIVE mg/dL
Specific Gravity, Urine: 1.025 (ref 1.005–1.030)
pH: 5 (ref 5.0–8.0)

## 2019-06-13 LAB — BASIC METABOLIC PANEL
Anion gap: 12 (ref 5–15)
BUN: 15 mg/dL (ref 8–23)
CO2: 21 mmol/L — ABNORMAL LOW (ref 22–32)
Calcium: 9.1 mg/dL (ref 8.9–10.3)
Chloride: 107 mmol/L (ref 98–111)
Creatinine, Ser: 1.23 mg/dL (ref 0.61–1.24)
GFR calc Af Amer: >60 mL/min (ref >60)
GFR calc non Af Amer: 57 mL/min — ABNORMAL LOW (ref >60)
Glucose, Bld: 98 mg/dL (ref 70–99)
Potassium: 3.9 mmol/L (ref 3.5–5.1)
Sodium: 140 mmol/L (ref 135–145)

## 2019-06-13 LAB — HEPATIC FUNCTION PANEL
ALT: 24 U/L (ref 0–44)
AST: 28 U/L (ref 15–41)
Albumin: 3.8 g/dL (ref 3.5–5.0)
Alkaline Phosphatase: 44 U/L (ref 38–126)
Bilirubin, Direct: 0.2 mg/dL (ref 0.0–0.2)
Indirect Bilirubin: 0.5 mg/dL (ref 0.3–0.9)
Total Bilirubin: 0.7 mg/dL (ref 0.3–1.2)
Total Protein: 6.3 g/dL — ABNORMAL LOW (ref 6.5–8.1)

## 2019-06-13 LAB — TROPONIN I (HIGH SENSITIVITY)
Troponin I (High Sensitivity): 11 ng/L (ref <18)
Troponin I (High Sensitivity): 12 ng/L (ref <18)

## 2019-06-13 LAB — CBG MONITORING, ED: Glucose-Capillary: 92 mg/dL (ref 70–99)

## 2019-06-13 LAB — SARS CORONAVIRUS 2 BY RT PCR (HOSPITAL ORDER, PERFORMED IN ~~LOC~~ HOSPITAL LAB): SARS Coronavirus 2: NEGATIVE

## 2019-06-13 LAB — HEMOGLOBIN A1C
Hgb A1c MFr Bld: 7.7 % — ABNORMAL HIGH (ref 4.8–5.6)
Mean Plasma Glucose: 174.29 mg/dL

## 2019-06-13 LAB — TSH: TSH: 2.445 u[IU]/mL (ref 0.350–4.500)

## 2019-06-13 MED ORDER — ACETAMINOPHEN 325 MG PO TABS: 650.0000 mg | ORAL_TABLET | Freq: Four times a day (QID) | ORAL | Status: DC | PRN

## 2019-06-13 MED ORDER — ASPIRIN EC 81 MG PO TBEC
81.0000 mg | DELAYED_RELEASE_TABLET | Freq: Every day | ORAL | Status: DC
  Administered 2019-06-14: 09:00:00 81 mg via ORAL
  Filled 2019-06-13: qty 1

## 2019-06-13 MED ORDER — ADULT MULTIVITAMIN W/MINERALS CH
1.0000 | ORAL_TABLET | Freq: Every day | ORAL | Status: DC
  Administered 2019-06-14: 09:00:00 1 via ORAL
  Filled 2019-06-13: qty 1

## 2019-06-13 MED ORDER — METOPROLOL SUCCINATE ER 25 MG PO TB24
25.0000 mg | ORAL_TABLET | Freq: Every day | ORAL | Status: DC
  Administered 2019-06-14: 09:00:00 25 mg via ORAL
  Filled 2019-06-13: qty 1

## 2019-06-13 MED ORDER — INSULIN ASPART 100 UNIT/ML ~~LOC~~ SOLN: 0.0000 [IU] | Freq: Every day | SUBCUTANEOUS | Status: DC

## 2019-06-13 MED ORDER — FENOFIBRATE 160 MG PO TABS
160.0000 mg | ORAL_TABLET | Freq: Every day | ORAL | Status: DC
  Administered 2019-06-14: 09:00:00 160 mg via ORAL
  Filled 2019-06-13: qty 1

## 2019-06-13 MED ORDER — ENOXAPARIN SODIUM 40 MG/0.4ML ~~LOC~~ SOLN
40.0000 mg | SUBCUTANEOUS | Status: DC
  Administered 2019-06-13: 40 mg via SUBCUTANEOUS
  Filled 2019-06-13: qty 0.4

## 2019-06-13 MED ORDER — SODIUM CHLORIDE 0.9 % IV BOLUS
1000.0000 mL | Freq: Once | INTRAVENOUS | Status: AC
  Administered 2019-06-13: 1000 mL via INTRAVENOUS

## 2019-06-13 MED ORDER — INSULIN ASPART 100 UNIT/ML ~~LOC~~ SOLN: 0.0000 [IU] | Freq: Three times a day (TID) | SUBCUTANEOUS | Status: DC

## 2019-06-13 MED ORDER — INSULIN PUMP
Freq: Three times a day (TID) | SUBCUTANEOUS | Status: DC
  Administered 2019-06-14: 12:00:00 via SUBCUTANEOUS
  Filled 2019-06-13: qty 1

## 2019-06-13 MED ORDER — HYDRALAZINE HCL 20 MG/ML IJ SOLN: 10.0000 mg | Freq: Four times a day (QID) | INTRAMUSCULAR | Status: DC | PRN

## 2019-06-13 MED ORDER — LACTATED RINGERS IV SOLN
INTRAVENOUS | Status: DC
  Administered 2019-06-13 – 2019-06-14 (×2): via INTRAVENOUS

## 2019-06-13 MED ORDER — COLCHICINE 0.6 MG PO TABS: 0.6000 mg | ORAL_TABLET | Freq: Every day | ORAL | Status: DC | PRN

## 2019-06-13 MED ORDER — ROSUVASTATIN CALCIUM 20 MG PO TABS
40.0000 mg | ORAL_TABLET | Freq: Every day | ORAL | Status: DC
  Administered 2019-06-14: 09:00:00 40 mg via ORAL
  Filled 2019-06-13: qty 2

## 2019-06-13 MED ORDER — CLOPIDOGREL BISULFATE 75 MG PO TABS
75.0000 mg | ORAL_TABLET | Freq: Every day | ORAL | Status: DC
  Administered 2019-06-14: 75 mg via ORAL
  Filled 2019-06-13: qty 1

## 2019-06-13 MED ORDER — NITROGLYCERIN 0.4 MG SL SUBL: 0.4000 mg | SUBLINGUAL_TABLET | SUBLINGUAL | Status: DC | PRN

## 2019-06-13 MED ORDER — ONDANSETRON HCL 4 MG/2ML IJ SOLN: 4.0000 mg | Freq: Four times a day (QID) | INTRAMUSCULAR | Status: DC | PRN

## 2019-06-13 NOTE — Progress Notes (Signed)
Spoke with Dr. Nevada Crane around insulin pump. Pt to manage insulin pump, she wants to leave [facility] insulin orders to remain in just incase there are issue with pump. Orders given, Diabetes corrdinator consulted as well. Patient is alert and orients and is able to verbalize how to manage his pump on his own. Has not eaten all day, meal tray ordered, Juice given. Pt's wife to bring additional insulin pump supplies tomorrow morning. He has enough to last him at this time. Site WNL.

## 2019-06-13 NOTE — ED Notes (Signed)
Patient back from CT.

## 2019-06-13 NOTE — ED Notes (Signed)
Attempted to call report x1. RN will call back.

## 2019-06-13 NOTE — ED Notes (Signed)
ED Provider at bedside. 

## 2019-06-13 NOTE — ED Provider Notes (Signed)
[facility] EMERGENCY DEPARTMENT Provider Note   CSN: [id number] Arrival date & time: 06/13/19  1357    History   Chief Complaint Chief Complaint  Patient presents with  . Loss of Consciousness    HPI TROYE HIEMSTRA is a 73 y.o. male.     The history is provided by the patient.  Loss of Consciousness Episode history:  Single Most recent episode:  Today Progression:  Resolved Chronicity:  New Context: normal activity   Witnessed: yes   Relieved by:  Bed rest Worsened by:  Nothing Associated symptoms: no anxiety, no chest pain, no confusion, no fever, no focal weakness, no palpitations, no seizures, no shortness of breath and no vomiting   Risk factors: coronary artery disease     Past Medical History:  Diagnosis Date  . Arthritis   . Colon polyp    adenomatous  . Coronary artery disease    post PTCA and stenting  . CORONARY ATHEROSCLEROSIS NATIVE CORONARY ARTERY 12/21/2009  . Diabetes mellitus   . Diverticulosis   . Hyperlipidemia   . Hypertension   . Kidney stones     Patient Active Problem List   Diagnosis Date Noted  . HYPERLIPIDEMIA 01/26/2010  . HYPERTENSION 01/26/2010  . CAD (coronary artery disease) 01/26/2010  . RECTAL BLEEDING 01/26/2010  . DM 12/21/2009  . CORONARY ATHEROSCLEROSIS NATIVE CORONARY ARTERY 12/21/2009  . PERSONAL HX COLONIC POLYPS 12/21/2009    Past Surgical History:  Procedure Laterality Date  . CORONARY ANGIOPLASTY WITH STENT PLACEMENT     left circumflex artery        Home Medications    Prior to Admission medications   Medication Sig Start Date End Date Taking? Authorizing Provider  aspirin 81 MG tablet Take 81 mg by mouth daily.      [provider]  [place]ch 5-2.5-18.5 injection Inject as directed once. 08/25/16   [provider]  insulin aspart (NOVOLOG) 100 UNIT/ML injection Inject 3.6 Units into the skin continuous. Pump 3.60 units    [provider]  JARDIANCE 25 MG  TABS tablet Take 1 tablet by mouth daily. 07/10/18   [provider]  metFORMIN (GLUCOPHAGE) 850 MG tablet Take 850 mg by mouth daily.     [provider]  metoprolol succinate (TOPROL-XL) 25 MG 24 hr tablet Take 25 mg by mouth 2 (two) times daily.    [provider]  multivitamin [facility]) per tablet Take 1 tablet by mouth daily.      [provider]  nitroGLYCERIN (NITROSTAT) 0.4 MG SL tablet Place 1 tablet (0.4 mg total) under the tongue every 5 (five) minutes as needed for chest pain. 08/17/15   Nahser, Wonda Cheng, MD  PLAVIX 75 MG tablet Take 1 tablet by mouth Daily. 03/27/11   [provider]  rosuvastatin (CRESTOR) 40 MG tablet Take 40 mg by mouth daily.    [provider]  TRILIPIX 135 MG capsule Take 1 tablet by mouth Daily. 03/27/11   [provider]    Family History Family History  Problem Relation Age of Onset  . Cancer Father        Brain Tumor  . Colon cancer Mother   . Colon polyps Neg Hx   . Kidney disease Neg Hx   . Diabetes Neg Hx   . Esophageal cancer Neg Hx     Social History Social History   Tobacco Use  . Smoking status: Former Smoker    Quit date: 11/13/1985  Years since quitting: 33.6  . Smokeless tobacco: Never Used  Substance Use Topics  . Alcohol use: Yes    Alcohol/week: 0.0 standard drinks    Comment: Occassionally  . Drug use: No     Allergies   Patient has no known allergies.   Review of Systems Review of Systems  Constitutional: Negative for chills and fever.  HENT: Negative for ear pain and sore throat.   Eyes: Negative for pain and visual disturbance.  Respiratory: Negative for cough and shortness of breath.   Cardiovascular: Positive for syncope. Negative for chest pain and palpitations.  Gastrointestinal: Negative for abdominal pain and vomiting.  Genitourinary: Negative for dysuria and hematuria.  Musculoskeletal: Negative for arthralgias and back pain.  Skin: Negative  for color change and rash.  Neurological: Positive for light-headedness (syncope and collapse). Negative for focal weakness, seizures and syncope.  Psychiatric/Behavioral: Negative for confusion.  All other systems reviewed and are negative.    Physical Exam Updated Vital Signs  ED Triage Vitals  Enc Vitals Group     BP 06/13/19 1400 (!) 163/96     Pulse Rate 06/13/19 1400 80     Resp 06/13/19 1400 15     Temp 06/13/19 1407 99 F (37.2 C)     Temp Source 06/13/19 1407 Oral     SpO2 06/13/19 1400 95 %     Weight 06/13/19 1403 225 lb (102.1 kg)     Height 06/13/19 1403 5' 11.5" (1.816 m)     Head Circumference --      Peak Flow --      Pain Score 06/13/19 1403 0     Pain Loc --      Pain Edu? --      Excl. in [place]? --     Physical Exam Vitals signs and nursing note reviewed.  Constitutional:      Appearance: He is well-developed.  HENT:     Head: Normocephalic and atraumatic.     Nose: Nose normal.     Mouth/Throat:     Mouth: Mucous membranes are dry.  Eyes:     Extraocular Movements: Extraocular movements intact.     Conjunctiva/sclera: Conjunctivae normal.     Pupils: Pupils are equal, round, and reactive to light.  Neck:     Musculoskeletal: Normal range of motion and neck supple.  Cardiovascular:     Rate and Rhythm: Normal rate and regular rhythm.     Pulses: Normal pulses.     Heart sounds: Normal heart sounds. No murmur.  Pulmonary:     Effort: Pulmonary effort is normal. No respiratory distress.     Breath sounds: Normal breath sounds.  Abdominal:     General: Abdomen is flat.     Palpations: Abdomen is soft.     Tenderness: There is no abdominal tenderness.  Musculoskeletal: Normal range of motion.     Right lower leg: No edema.     Left lower leg: No edema.  Skin:    General: Skin is warm and dry.  Neurological:     General: No focal deficit present.     Mental Status: He is alert and oriented to person, place, and time.     Cranial Nerves: No  cranial nerve deficit.     Sensory: No sensory deficit.     Motor: No weakness.     Coordination: Coordination normal.     Comments: 5+ out of 5 strength throughout, normal sensation, no drift, normal finger-to-nose finger, normal  speech      ED Treatments / Results  Labs (all labs ordered are listed, but only abnormal results are displayed) Labs Reviewed  CBC WITH DIFFERENTIAL/PLATELET - Abnormal; Notable for the following components:      Result Value   WBC 10.6 (*)    Neutro Abs 8.0 (*)    All other components within normal limits  BASIC METABOLIC PANEL - Abnormal; Notable for the following components:   CO2 21 (*)    GFR calc non Af Amer 57 (*)    All other components within normal limits  HEPATIC FUNCTION PANEL - Abnormal; Notable for the following components:   Total Protein 6.3 (*)    All other components within normal limits  SARS CORONAVIRUS 2  URINALYSIS, ROUTINE W REFLEX MICROSCOPIC  CBG MONITORING, ED  TROPONIN I (HIGH SENSITIVITY)    EKG EKG Interpretation  Date/Time:  Friday June 13 2019 15:16:51 EDT Ventricular Rate:  72 PR Interval:    QRS Duration: 95 QT Interval:  382 QTC Calculation: 418 R Axis:   89 Text Interpretation:  Sinus rhythm Prolonged PR interval Borderline right axis deviation Confirmed by Lennice Sites [id number]) on 06/13/2019 3:36:12 PM   Radiology Ct Head Wo Contrast  Result Date: 06/13/2019 CLINICAL DATA:  Syncopal episode. EXAM: CT HEAD WITHOUT CONTRAST TECHNIQUE: Contiguous axial images were obtained from the base of the skull through the vertex without intravenous contrast. COMPARISON:  None. FINDINGS: Brain: Mild age related cerebral atrophy and ventriculomegaly. No acute intracranial findings or mass lesions. No extra-axial fluid collections are identified. The gray-white differentiation is maintained. Vascular: No hyperdense vessel or unexpected calcification. Moderate vascular calcifications. Skull: Normal. Negative for fracture or  focal lesion. Sinuses/Orbits: The paranasal sinuses and mastoid air cells are clear. The globes are intact. Other: No scalp lesions or hematoma. IMPRESSION: Age related cerebral atrophy and ventriculomegaly. No acute intracranial findings or mass lesions. Electronically Signed   By: Marijo Sanes M.D.   On: 06/13/2019 15:20   Dg Chest Portable 1 View  Result Date: 06/13/2019 CLINICAL DATA:  Syncopal episode. EXAM: PORTABLE CHEST 1 VIEW COMPARISON:  None. FINDINGS: The cardiac silhouette, mediastinal and hilar contours are within normal limits. There is mild tortuosity and calcification of the thoracic aorta. The lungs are clear. No pleural effusions. No worrisome pulmonary lesions. The bony thorax is intact. IMPRESSION: No acute cardiopulmonary findings. Electronically Signed   By: Marijo Sanes M.D.   On: 06/13/2019 15:49    Procedures Procedures (including critical care time)  Medications Ordered in ED Medications  sodium chloride 0.9 % bolus 1,000 mL (has no administration in time range)     Initial Impression / Assessment and Plan / ED Course  I have reviewed the triage vital signs and the nursing notes.  Pertinent labs & imaging results that were available during my care of the patient were reviewed by me and considered in my medical decision making (see chart for details).     DONAVYN FECHER is a 73 year old male with history of diabetes, heart disease who presents to the ED after syncopal episode.  Patient with unremarkable vitals.  Normal blood sugar.  Patient had unprovoked syncopal event that was witnessed by his wife.  Patient got out of his car to open up the trunk for his wife who is but no way things that she had just bought from a store when he passed out.  Patient states that he did not feel dizzy, lightheaded, nauseous before this.  Possibly hit  his head.  Patient quickly had a return to normal.  No seizure-like activity.  Patient does have insulin pump but his blood sugar  was 125 with EMS.  No history of arrhythmias.  Is on Plavix for CAD and does have a stent.  Will initiate syncope work-up with head CT, basic labs, EKG, troponin.  Concerning for arrhythmia.  Anticipating admission for syncope work-up, telemetry.  Neurologically patient is intact.  No extremity tenderness.  Patient with unremarkable head CT.  Chest x-ray with no signs of pneumonia, no pneumothorax, no pleural effusion.  No significant electrolyte abnormality, kidney injury.  Troponin within normal limits.  EKG shows first-degree heart block.  No ischemic changes.  Overall unremarkable work-up however concerning story of syncope and patient multiple risk factors.  Will admit to hospitalist for syncope observation/telemetry.  COVID test has been sent.  This chart was dictated using voice recognition software.  Despite best efforts to proofread,  errors can occur which can change the documentation meaning.    Final Clinical Impressions(s) / ED Diagnoses   Final diagnoses:  Syncope and collapse    ED Discharge Orders    None       Lennice Sites, DO 06/13/19 1553

## 2019-06-13 NOTE — H&P (Signed)
History and Physical  Jason Giles:[id number] DOB: 1946/05/30 DOA: 06/13/2019  Referring physician: Dr. Ronnald Nian PCP: Osborne Casco Fransico Him, MD  Outpatient Specialists: Cardiology, Dr. Acie Fredrickson Patient coming from: Home  Chief Complaint: Loss of consciousness.  HPI: Jason Giles is a 73 y.o. male with medical history significant for coronary artery disease status post PCI with stent in 2011, first-degree AV block, type 2 diabetes on insulin pump, essential hypertension, hyperlipidemia, obesity, who presented to [facility] ED via EMS due to loss of consciousness witnessed by his wife.  Event happened today around noon outside of a store, patient was assisting his wife with loading groceries when he suddenly collapsed.  Loss of consciousness lasted approximately 2 minutes.  No seizure activity.  No postictal symptoms.  Quickly had a return to normal.  EMS arrived, blood sugar normal.  No significant arrhythmia.  No prior history of syncope or seizure.  Was in his usual state of health prior to this.  No recent changes in his medications, no diuretic use, no changes in lifestyle.  Sees his cardiologist once a year, last appointment was in December.  EMS brought the patient to the ED for further evaluation.  [facility] called to admit for syncope work-up.  ED Course: On presentation to the ED, vital signs remarkable for mildly elevated blood pressure.  Lab studies essentially unremarkable, troponin negative.  Patient is at baseline.  CT head unremarkable for any acute intracranial abnormality.  Twelve-lead EKG showing first-degree AV block.  Review of Systems: Review of systems as noted in the HPI. All other systems reviewed and are negative.   Past Medical History:  Diagnosis Date  . Arthritis   . Colon polyp    adenomatous  . Coronary artery disease    post PTCA and stenting  . CORONARY ATHEROSCLEROSIS NATIVE CORONARY ARTERY 12/21/2009  . Diabetes mellitus   . Diverticulosis   . Hyperlipidemia   .  Hypertension   . Kidney stones    Past Surgical History:  Procedure Laterality Date  . CORONARY ANGIOPLASTY WITH STENT PLACEMENT     left circumflex artery    Social History:  reports that he quit smoking about 33 years ago. He has never used smokeless tobacco. He reports current alcohol use. He reports that he does not use drugs.   No Known Allergies  Family History  Problem Relation Age of Onset  . Cancer Father        Brain Tumor  . Colon cancer Mother   . Colon polyps Neg Hx   . Kidney disease Neg Hx   . Diabetes Neg Hx   . Esophageal cancer Neg Hx      Prior to Admission medications   Medication Sig Start Date End Date Taking? Authorizing Provider  acetaminophen (TYLENOL) 325 MG tablet Take 325 mg by mouth every 6 (six) hours as needed for mild pain or headache.   Yes [provider]  aspirin 81 MG tablet Take 81 mg by mouth daily.     Yes [provider]  COLCRYS 0.6 MG tablet Take 0.6 mg by mouth See admin instructions. Take 0.6 mg by mouth once a day for 2-3 days as directed for gout flares 02/26/19  Yes [provider]  fenofibrate micronized (LOFIBRA) 134 MG capsule Take 134 mg by mouth daily before breakfast.  06/02/19  Yes [provider]  insulin aspart (NOVOLOG) 100 UNIT/ML injection Inject into the skin continuous. Per pump   Yes [provider]  Insulin Human (  INSULIN PUMP) SOLN Inject into the skin continuous. Using Novolog   Yes [provider]  JARDIANCE 25 MG TABS tablet Take 25 mg by mouth daily.  07/10/18  Yes [provider]  metFORMIN (GLUCOPHAGE) 850 MG tablet Take 850 mg by mouth 2 (two) times daily with a meal.    Yes [provider]  metoprolol succinate (TOPROL-XL) 25 MG 24 hr tablet Take 25 mg by mouth daily.    Yes [provider]  multivitamin (ONE-A-DAY MEN'S) TABS tablet Take 1 tablet by mouth daily.   Yes [provider]  nitroGLYCERIN (NITROSTAT) 0.4 MG SL  tablet Place 1 tablet (0.4 mg total) under the tongue every 5 (five) minutes as needed for chest pain. 08/17/15  Yes Nahser, Wonda Cheng, MD  PLAVIX 75 MG tablet Take 75 mg by mouth Daily.  03/27/11  Yes [provider]  rosuvastatin (CRESTOR) 40 MG tablet Take 40 mg by mouth daily.   Yes [provider]    Physical Exam: BP (!) 141/64 (BP Location: Right Arm)   Pulse 73   Temp 98.7 F (37.1 C) (Oral)   Resp (!) 25   Ht 5' 11.5" (1.816 m)   Wt 102.1 kg   SpO2 93%   BMI 30.94 kg/m   . General: 73 y.o. year-old male well developed well nourished in no acute distress.  Alert and oriented x3. . Cardiovascular: Regular rate and rhythm with no rubs or gallops.  No thyromegaly or JVD noted.  No lower extremity edema. 2/4 pulses in all 4 extremities. Marland Kitchen Respiratory: Clear to auscultation with no wheezes or rales. Good inspiratory effort. . Abdomen: Soft nontender nondistended with normal bowel sounds x4 quadrants. . Muskuloskeletal: No cyanosis, clubbing or edema noted bilaterally . Neuro: CN II-XII intact, strength, sensation, reflexes . Skin: No ulcerative lesions noted or rashes . Psychiatry: Judgement and insight appear normal. Mood is appropriate for condition and setting          Labs on Admission:  Basic Metabolic Panel: Recent Labs  Lab 06/13/19 1435  NA 140  K 3.9  CL 107  CO2 21*  GLUCOSE 98  BUN 15  CREATININE 1.23  CALCIUM 9.1   Liver Function Tests: Recent Labs  Lab 06/13/19 1435  AST 28  ALT 24  ALKPHOS 44  BILITOT 0.7  PROT 6.3*  ALBUMIN 3.8   No results for input(s): LIPASE, AMYLASE in the last 168 hours. No results for input(s): AMMONIA in the last 168 hours. CBC: Recent Labs  Lab 06/13/19 1435  WBC 10.6*  NEUTROABS 8.0*  HGB 16.6  HCT 50.9  MCV 94.6  PLT 210   Cardiac Enzymes: No results for input(s): CKTOTAL, CKMB, CKMBINDEX, TROPONINI in the last 168 hours.  BNP (last 3 results) No results for input(s): BNP in the last 8760  hours.  ProBNP (last 3 results) No results for input(s): PROBNP in the last 8760 hours.  CBG: Recent Labs  Lab 06/13/19 1513  GLUCAP 92    Radiological Exams on Admission: Ct Head Wo Contrast  Result Date: 06/13/2019 CLINICAL DATA:  Syncopal episode. EXAM: CT HEAD WITHOUT CONTRAST TECHNIQUE: Contiguous axial images were obtained from the base of the skull through the vertex without intravenous contrast. COMPARISON:  None. FINDINGS: Brain: Mild age related cerebral atrophy and ventriculomegaly. No acute intracranial findings or mass lesions. No extra-axial fluid collections are identified. The gray-white differentiation is maintained. Vascular: No hyperdense vessel or unexpected calcification. Moderate vascular calcifications. Skull: Normal. Negative for  fracture or focal lesion. Sinuses/Orbits: The paranasal sinuses and mastoid air cells are clear. The globes are intact. Other: No scalp lesions or hematoma. IMPRESSION: Age related cerebral atrophy and ventriculomegaly. No acute intracranial findings or mass lesions. Electronically Signed   By: Marijo Sanes M.D.   On: 06/13/2019 15:20   Dg Chest Portable 1 View  Result Date: 06/13/2019 CLINICAL DATA:  Syncopal episode. EXAM: PORTABLE CHEST 1 VIEW COMPARISON:  None. FINDINGS: The cardiac silhouette, mediastinal and hilar contours are within normal limits. There is mild tortuosity and calcification of the thoracic aorta. The lungs are clear. No pleural effusions. No worrisome pulmonary lesions. The bony thorax is intact. IMPRESSION: No acute cardiopulmonary findings. Electronically Signed   By: Marijo Sanes M.D.   On: 06/13/2019 15:49    EKG: I independently viewed the EKG done and my findings are as followed: Sinus rhythm rate of 72, QTc 418, first-degree AV block.  Assessment/Plan Present on Admission: . Syncope  Active Problems:   Syncope  Newly diagnosed syncope, likely cardiogenic, witnessed Presented after sudden loss of  consciousness and sudden return to normal No seizure like activity, No postictal symptoms, No hypoglycemia, No anemia No sign of infective process, chest x-ray, urine analysis unremarkable, afebrile with no leukocytosis Asymptomatic in the ED Admitted for syncope work-up Obtain 2D echo, bilateral carotid Doppler ultrasound, repeat twelve-lead EKG in the morning Closely monitor on telemetry Obtain orthostatic vital signs Fall precautions Start IV fluids lactated Ringer's at 31 cc/h x 1 day Obtain TSH  Consult cardiology in the morning for possible ambulatory cardiac monitoring outpatient set up. Follows with cardiology outpatient, Dr. Acie Fredrickson  Coronary artery disease status post PCI with stenting in 2011 Resume aspirin, Plavix, Crestor No anginal symptoms Monitor on telemetry  Type 2 diabetes, on insulin pump with hyperglycemia Continue insulin pump, has adequate insulin supply Sensitive insulin sliding scale Obtain hemoglobin A1c, add on Heart healthy carb modified diet  First-degree AV block Chronic Personally reviewed twelve-lead EKG done on admission which showed sinus rhythm with first-degree AV block Continue to monitor rhythm on telemetry Would benefit from ambulatory 30-day cardiac monitoring  Hyperlipidemia Resume home Crestor  Essential hypertension Resume Toprol-XL Continue to monitor vital signs IV hydralazine 10 mg PRN for SBP greater than 180  Obesity BMI 30 Recommend weight loss outpatient with healthy dieting and regular physical activity PT OT to assess  CKD 3 Appears to be at his baseline creatinine 1.23 with GFR 57 Avoid nephrotoxins Start LR at 75 cc/h Monitor ins and outs and document Repeat BMP in the morning    DVT prophylaxis: Subcu Lovenox daily  Code Status: Full code, as confirmed by the patient.  Family Communication: Updated wife at bedside.  All questions answered to her satisfaction.  Disposition Plan: Admit to telemetry cardiac.   Consults called: Please consult cardiology in the morning.  Admission status: Observation status.    Kayleen Memos MD [facility] Pager [phone]  If 7PM-7AM, please contact night-coverage [email or web address] Password [email or web address]  06/13/2019, 5:29 PM

## 2019-06-13 NOTE — ED Triage Notes (Signed)
Pt arrives with [facility] after having a syncopal episode in [employer] parking lot. Pt states he became dizzy as he was opening car truck and passed out in parking lot. Pt denies neck pain and back pain. Pt is on plavix; hx of stents and uses an insulin pump. Pt is a&o x4.  EMS vitals:  170/90 HR 80 CBG 125 95% on RA

## 2019-06-13 NOTE — ED Notes (Signed)
ED TO INPATIENT HANDOFF REPORT  ED Nurse Name and Phone #: Percell Locus, RN  S Name/Age/Gender Jason Giles 73 y.o. male Room/Bed: 038C/038C  Code Status   Code Status: Full Code  Home/SNF/Other Home Patient oriented to: self, place, time and situation Is this baseline? Yes   Triage Complete: Triage complete  Chief Complaint sick  Triage Note Pt arrives with [facility] after having a syncopal episode in [employer] parking lot. Pt states he became dizzy as he was opening car truck and passed out in parking lot. Pt denies neck pain and back pain. Pt is on plavix; hx of stents and uses an insulin pump. Pt is a&o x4.  EMS vitals:  170/90 HR 80 CBG 125 95% on RA    Allergies No Known Allergies  Level of Care/Admitting Diagnosis ED Disposition    ED Disposition Condition [facility] Area: [place] [[id number]]  Level of Care: Telemetry Cardiac [103]  I expect the patient will be discharged within 24 hours: Yes  LOW acuity---Tx typically complete <24 hrs---ACUTE conditions typically can be evaluated <24 hours---LABS likely to return to acceptable levels <24 hours---IS near functional baseline---EXPECTED to return to current living arrangement---NOT newly hypoxic: Does not meet criteria for 5C-Observation unit  Covid Evaluation: Asymptomatic Screening Protocol (No Symptoms)  Diagnosis: Syncope [206001]  Admitting Physician: Kayleen Memos [[id number]]  Attending Physician: Kayleen Memos [[id number]]  PT Class (Do Not Modify): Observation [104]  PT Acc Code (Do Not Modify): Observation [10022]       B Medical/Surgery History Past Medical History:  Diagnosis Date  . Arthritis   . Colon polyp    adenomatous  . Coronary artery disease    post PTCA and stenting  . CORONARY ATHEROSCLEROSIS NATIVE CORONARY ARTERY 12/21/2009  . Diabetes mellitus   . Diverticulosis   . Hyperlipidemia   . Hypertension   . Kidney stones    Past Surgical History:   Procedure Laterality Date  . CORONARY ANGIOPLASTY WITH STENT PLACEMENT     left circumflex artery     A IV Location/Drains/Wounds Patient Lines/Drains/Airways Status   Active Line/Drains/Airways    Name:   Placement date:   Placement time:   Site:   Days:   Peripheral IV 06/13/19 Left Forearm   06/13/19    1426    Forearm   less than 1          Intake/Output Last 24 hours  Intake/Output Summary (Last 24 hours) at 06/13/2019 1749 Last data filed at 06/13/2019 1741 Gross per 24 hour  Intake 1000 ml  Output -  Net 1000 ml    Labs/Imaging Results for orders placed or performed during the hospital encounter of 06/13/19 (from the past 48 hour(s))  CBC with Differential     Status: Abnormal   Collection Time: 06/13/19  2:35 PM  Result Value Ref Range   WBC 10.6 (H) 4.0 - 10.5 K/uL   RBC 5.38 4.22 - 5.81 MIL/uL   Hemoglobin 16.6 13.0 - 17.0 g/dL   HCT 50.9 39.0 - 52.0 %   MCV 94.6 80.0 - 100.0 fL   MCH 30.9 26.0 - 34.0 pg   MCHC 32.6 30.0 - 36.0 g/dL   RDW 13.0 11.5 - 15.5 %   Platelets 210 150 - 400 K/uL   nRBC 0.0 0.0 - 0.2 %   Neutrophils Relative % 76 %   Neutro Abs 8.0 (H) 1.7 - 7.7 K/uL   Lymphocytes Relative 13 %  Lymphs Abs 1.4 0.7 - 4.0 K/uL   Monocytes Relative 9 %   Monocytes Absolute 1.0 0.1 - 1.0 K/uL   Eosinophils Relative 2 %   Eosinophils Absolute 0.2 0.0 - 0.5 K/uL   Basophils Relative 0 %   Basophils Absolute 0.0 0.0 - 0.1 K/uL   Immature Granulocytes 0 %   Abs Immature Granulocytes 0.04 0.00 - 0.07 K/uL    Comment: Performed at [facility], [place] [address], [place], [place] [address]  Basic metabolic panel     Status: Abnormal   Collection Time: 06/13/19  2:35 PM  Result Value Ref Range   Sodium 140 135 - 145 mmol/L   Potassium 3.9 3.5 - 5.1 mmol/L   Chloride 107 98 - 111 mmol/L   CO2 21 (L) 22 - 32 mmol/L   Glucose, Bld 98 70 - 99 mg/dL   BUN 15 8 - 23 mg/dL   Creatinine, Ser 1.23 0.61 - 1.24 mg/dL   Calcium 9.1 8.9 - 10.3 mg/dL    GFR calc non Af Amer 57 (L) >60 mL/min   GFR calc Af Amer >60 >60 mL/min   Anion gap 12 5 - 15    Comment: Performed at [facility], [place] [address], [place], [place] [address]  Hepatic function panel     Status: Abnormal   Collection Time: 06/13/19  2:35 PM  Result Value Ref Range   Total Protein 6.3 (L) 6.5 - 8.1 g/dL   Albumin 3.8 3.5 - 5.0 g/dL   AST 28 15 - 41 U/L   ALT 24 0 - 44 U/L   Alkaline Phosphatase 44 38 - 126 U/L   Total Bilirubin 0.7 0.3 - 1.2 mg/dL   Bilirubin, Direct 0.2 0.0 - 0.2 mg/dL   Indirect Bilirubin 0.5 0.3 - 0.9 mg/dL    Comment: Performed at [facility] [address], [place], [place] [address]  Troponin I (High Sensitivity)     Status: None   Collection Time: 06/13/19  2:35 PM  Result Value Ref Range   Troponin I (High Sensitivity) 11 <18 ng/L    Comment: (NOTE) Elevated high sensitivity troponin I (hsTnI) values and significant  changes across serial measurements may suggest ACS but many other  chronic and acute conditions are known to elevate hsTnI results.  Refer to the "Links" section for chest pain algorithms and additional  guidance. Performed at [facility], [place] [address], [place], [place] [address]   POC CBG, ED     Status: None   Collection Time: 06/13/19  3:13 PM  Result Value Ref Range   Glucose-Capillary 92 70 - 99 mg/dL   Comment 1 Notify RN    Comment 2 Document in Chart   Troponin I (High Sensitivity)     Status: None   Collection Time: 06/13/19  4:36 PM  Result Value Ref Range   Troponin I (High Sensitivity) 12 <18 ng/L    Comment: (NOTE) Elevated high sensitivity troponin I (hsTnI) values and significant  changes across serial measurements may suggest ACS but many other  chronic and acute conditions are known to elevate hsTnI results.  Refer to the "Links" section for chest pain algorithms and additional  guidance. Performed at [facility], [place] [address], [place], [place] [address]    Urinalysis, Routine w reflex microscopic     Status: Abnormal   Collection Time: 06/13/19  4:41 PM  Result Value Ref Range   Color,  Urine YELLOW YELLOW   APPearance CLEAR CLEAR   Specific Gravity, Urine 1.025 1.005 - 1.030   pH 5.0 5.0 - 8.0   Glucose, UA >=500 (A) NEGATIVE mg/dL   Hgb urine dipstick NEGATIVE NEGATIVE   Bilirubin Urine NEGATIVE NEGATIVE   Ketones, ur NEGATIVE NEGATIVE mg/dL   Protein, ur NEGATIVE NEGATIVE mg/dL   Nitrite NEGATIVE NEGATIVE   Leukocytes,Ua NEGATIVE NEGATIVE   RBC / HPF 0-5 0 - 5 RBC/hpf   WBC, UA 0-5 0 - 5 WBC/hpf   Bacteria, UA NONE SEEN NONE SEEN   Mucus PRESENT     Comment: Performed at [facility] [address], [place], [place] [address]   Ct Head Wo Contrast  Result Date: 06/13/2019 CLINICAL DATA:  Syncopal episode. EXAM: CT HEAD WITHOUT CONTRAST TECHNIQUE: Contiguous axial images were obtained from the base of the skull through the vertex without intravenous contrast. COMPARISON:  None. FINDINGS: Brain: Mild age related cerebral atrophy and ventriculomegaly. No acute intracranial findings or mass lesions. No extra-axial fluid collections are identified. The gray-white differentiation is maintained. Vascular: No hyperdense vessel or unexpected calcification. Moderate vascular calcifications. Skull: Normal. Negative for fracture or focal lesion. Sinuses/Orbits: The paranasal sinuses and mastoid air cells are clear. The globes are intact. Other: No scalp lesions or hematoma. IMPRESSION: Age related cerebral atrophy and ventriculomegaly. No acute intracranial findings or mass lesions. Electronically Signed   By: Marijo Sanes M.D.   On: 06/13/2019 15:20   Dg Chest Portable 1 View  Result Date: 06/13/2019 CLINICAL DATA:  Syncopal episode. EXAM: PORTABLE CHEST 1 VIEW COMPARISON:  None. FINDINGS: The cardiac silhouette, mediastinal and hilar contours are within normal limits. There is mild tortuosity and calcification of the thoracic aorta. The  lungs are clear. No pleural effusions. No worrisome pulmonary lesions. The bony thorax is intact. IMPRESSION: No acute cardiopulmonary findings. Electronically Signed   By: Marijo Sanes M.D.   On: 06/13/2019 15:49    Pending Labs Unresulted Labs (From admission, onward)    Start     Ordered   06/14/19 0500  CBC  Tomorrow morning,   R     06/13/19 1701   06/14/19 4268  Basic metabolic panel  Tomorrow morning,   R     06/13/19 1701   06/13/19 1701  TSH  Add-on,   AD     06/13/19 1701   06/13/19 1659  Hemoglobin A1c  Add-on,   AD    Comments: To assess prior glycemic control    06/13/19 1658   06/13/19 1617  SARS Coronavirus 2 [facility] order, Performed in [facility] hospital lab)  Once,   R     06/13/19 1617          Vitals/Pain Today's Vitals   06/13/19 1445 06/13/19 1515 06/13/19 1645 06/13/19 1730  BP: (!) 146/69 (!) 152/78 (!) 141/64 (!) 148/71  Pulse: 68 73 73 73  Resp: (!) 24 (!) 28 (!) 25 (!) 23  Temp:      TempSrc:      SpO2: 94% 93% 93% 93%  Weight:      Height:      PainSc:        Isolation Precautions No active isolations  Medications Medications  lactated ringers infusion (has no administration in time range)  insulin aspart (novoLOG) injection 0-5 Units (has no administration in time range)  insulin aspart (novoLOG) injection 0-9 Units (has no administration in time range)  acetaminophen (TYLENOL) tablet 650 mg (has no  administration in time range)  ondansetron (ZOFRAN) injection 4 mg (has no administration in time range)  aspirin EC tablet 81 mg (has no administration in time range)  colchicine tablet 0.6 mg (has no administration in time range)  fenofibrate tablet 160 mg (has no administration in time range)  metoprolol succinate (TOPROL-XL) 24 hr tablet 25 mg (has no administration in time range)  multivitamin with minerals tablet 1 tablet (has no administration in time range)  nitroGLYCERIN (NITROSTAT) SL tablet 0.4 mg (has no administration in  time range)  clopidogrel (PLAVIX) tablet 75 mg (has no administration in time range)  rosuvastatin (CRESTOR) tablet 40 mg (has no administration in time range)  sodium chloride 0.9 % bolus 1,000 mL (0 mLs Intravenous Stopped 06/13/19 1741)    Mobility non-ambulatory Moderate fall risk   Focused Assessments Cardiac Assessment Handoff:    No results found for: CKTOTAL, CKMB, CKMBINDEX, TROPONINI No results found for: DDIMER Does the Patient currently have chest pain? No      R Recommendations: See Admitting Provider Note  Report given to:   Additional Notes:

## 2019-06-13 NOTE — Consult Note (Signed)
Cardiology Consultation:   Patient ID: STEPEHN Giles MRN: [id number]; DOB: 1946/08/01  Admit date: 06/13/2019 Date of Consult: 06/13/2019  Primary Care Provider: Haywood Pao, MD Primary Cardiologist: Mertie Moores, MD  Primary Electrophysiologist:  None    Patient Profile:   Jason Giles is a 73 y.o. male with a hx of hypertension, coronary artery disease-stenting in 2011, hyperlipidemia.  Who is being seen today for the evaluation of syncope at the request of Dr. Nevada Crane.  History of Present Illness:   Mr. Jason Giles is a 73 year old gentleman who I follow in the clinic.  He has a history of coronary artery disease and is status post stenting in May, 2011 and 2012.  He has a history of hypertension and hyperlipidemia.  He was last seen in the office in December, 2019.  The patient has been in his usual health.  Today he went with his wife to the store to pick up a picture frame.  He ate a light lunch consisting of a piece of Quiche  and [place] around 10 AM.  At 2 PM he was waiting in the car for his wife.  She came out of the store and he got out of the car to walk around to open the door.  He took several steps and passed out on the pavement.  He woke up immediately.  There was no post ictal signs or symptoms.  There was no bowel or bladder incontinence. He stated that he had plenty to drink this morning but what he drank consisted of 2 cups of coffee, several cups of iced tea and perhaps some water.  He now feels completely well.  He is not had any episodes of angina.  He has been quite busy and has not had any chest pain or shortness of breath.  He has been taking his medications regularly.  Heart Pathway Score:     Past Medical History:  Diagnosis Date  . Arthritis   . Colon polyp    adenomatous  . Coronary artery disease    post PTCA and stenting  . CORONARY ATHEROSCLEROSIS NATIVE CORONARY ARTERY 12/21/2009  . Diabetes mellitus   . Diverticulosis   . Hyperlipidemia    . Hypertension   . Kidney stones     Past Surgical History:  Procedure Laterality Date  . CORONARY ANGIOPLASTY WITH STENT PLACEMENT     left circumflex artery     Home Medications:  Prior to Admission medications   Medication Sig Start Date End Date Taking? Authorizing Provider  acetaminophen (TYLENOL) 325 MG tablet Take 325 mg by mouth every 6 (six) hours as needed for mild pain or headache.   Yes [provider]  aspirin 81 MG tablet Take 81 mg by mouth daily.     Yes [provider]  COLCRYS 0.6 MG tablet Take 0.6 mg by mouth See admin instructions. Take 0.6 mg by mouth once a day for 2-3 days as directed for gout flares 02/26/19  Yes [provider]  fenofibrate micronized (LOFIBRA) 134 MG capsule Take 134 mg by mouth daily before breakfast.  06/02/19  Yes [provider]  insulin aspart (NOVOLOG) 100 UNIT/ML injection Inject into the skin continuous. Per pump   Yes [provider]  Insulin Human (INSULIN PUMP) SOLN Inject into the skin continuous. Using Novolog   Yes [provider]  JARDIANCE 25 MG TABS tablet Take 25 mg by mouth daily.  07/10/18  Yes [provider]  metFORMIN (GLUCOPHAGE) 850  MG tablet Take 850 mg by mouth 2 (two) times daily with a meal.    Yes [provider]  metoprolol succinate (TOPROL-XL) 25 MG 24 hr tablet Take 25 mg by mouth daily.    Yes [provider]  multivitamin (ONE-A-DAY MEN'S) TABS tablet Take 1 tablet by mouth daily.   Yes [provider]  nitroGLYCERIN (NITROSTAT) 0.4 MG SL tablet Place 1 tablet (0.4 mg total) under the tongue every 5 (five) minutes as needed for chest pain. 08/17/15  Yes Talley Kreiser, Wonda Cheng, MD  PLAVIX 75 MG tablet Take 75 mg by mouth Daily.  03/27/11  Yes [provider]  rosuvastatin (CRESTOR) 40 MG tablet Take 40 mg by mouth daily.   Yes [provider]    Inpatient Medications: Scheduled Meds: . [START ON 06/14/2019] aspirin  EC  81 mg Oral Daily  . [START ON 06/14/2019] clopidogrel  75 mg Oral Daily  . enoxaparin (LOVENOX) injection  40 mg Subcutaneous Q24H  . fenofibrate  160 mg Oral Daily  . insulin aspart  0-5 Units Subcutaneous QHS  . insulin aspart  0-9 Units Subcutaneous TID WC  . [START ON 06/14/2019] metoprolol succinate  25 mg Oral Daily  . [START ON 06/14/2019] multivitamin with minerals  1 tablet Oral Daily  . [START ON 06/14/2019] rosuvastatin  40 mg Oral Daily   Continuous Infusions: . lactated ringers     PRN Meds: acetaminophen, colchicine, hydrALAZINE, nitroGLYCERIN, ondansetron (ZOFRAN) IV  Allergies:   No Known Allergies  Social History:   Social History   Socioeconomic History  . Marital status: Married    Spouse name: Not on file  . Number of children: 4  . Years of education: Not on file  . Highest education level: Not on file  Occupational History  . Occupation: Retired  Scientific laboratory technician  . Financial resource strain: Not on file  . Food insecurity    Worry: Not on file    Inability: Not on file  . Transportation needs    Medical: Not on file    Non-medical: Not on file  Tobacco Use  . Smoking status: Former Smoker    Quit date: 11/13/1985    Years since quitting: 33.6  . Smokeless tobacco: Never Used  Substance and Sexual Activity  . Alcohol use: Yes    Alcohol/week: 0.0 standard drinks    Comment: Occassionally  . Drug use: No  . Sexual activity: Not on file  Lifestyle  . Physical activity    Days per week: Not on file    Minutes per session: Not on file  . Stress: Not on file  Relationships  . Social Herbalist on phone: Not on file    Gets together: Not on file    Attends religious service: Not on file    Active member of club or organization: Not on file    Attends meetings of clubs or organizations: Not on file    Relationship status: Not on file  . Intimate partner violence    Fear of current or ex partner: Not on file    Emotionally abused: Not on  file    Physically abused: Not on file    Forced sexual activity: Not on file  Other Topics Concern  . Not on file  Social History Narrative  . Not on file    Family History:    Family History  Problem Relation Age of Onset  . Cancer Father  Brain Tumor  . Colon cancer Mother   . Colon polyps Neg Hx   . Kidney disease Neg Hx   . Diabetes Neg Hx   . Esophageal cancer Neg Hx      ROS:  Please see the history of present illness.   All other ROS reviewed and negative.     Physical Exam/Data:   Vitals:   06/13/19 1515 06/13/19 1645 06/13/19 1730 06/13/19 1745  BP: (!) 152/78 (!) 141/64 (!) 148/71 (!) 159/74  Pulse: 73 73 73   Resp: (!) 28 (!) 25 (!) 23 (!) 26  Temp:      TempSrc:      SpO2: 93% 93% 93%   Weight:      Height:        Intake/Output Summary (Last 24 hours) at 06/13/2019 1817 Last data filed at 06/13/2019 1741 Gross per 24 hour  Intake 1000 ml  Output -  Net 1000 ml   Last 3 Weights 06/13/2019 10/21/2018 09/24/2018  Weight (lbs) 225 lb 234 lb 233 lb  Weight (kg) 102.059 kg 106.142 kg 105.688 kg     Body mass index is 30.94 kg/m.  General:  Well nourished, well developed, in no acute distress HEENT: normal Lymph: no adenopathy Neck: no JVD Endocrine:  No thryomegaly Vascular: No carotid bruits; FA pulses 2+ bilaterally without bruits  Cardiac:  normal S1, S2; RRR; no murmur  Lungs:  clear to auscultation bilaterally, no wheezing, rhonchi or rales  Abd: soft, nontender, no hepatomegaly  Ext: no edema Musculoskeletal:  No deformities, BUE and BLE strength normal and equal Skin: warm and dry  Neuro:  CNs 2-12 intact, no focal abnormalities noted Psych:  Normal affect   EKG:  The EKG was personally reviewed and demonstrates:   Normal sinus rhythm at 72 beats a minute.  No ST or T wave changes. Telemetry:  Telemetry was personally reviewed and demonstrates: Normal sinus rhythm  Relevant CV Studies:   Laboratory Data:  High Sensitivity  Troponin:   Recent Labs  Lab 06/13/19 1435 06/13/19 1636  TROPONINIHS 11 12     Cardiac EnzymesNo results for input(s): TROPONINI in the last 168 hours. No results for input(s): TROPIPOC in the last 168 hours.  Chemistry Recent Labs  Lab 06/13/19 1435  NA 140  K 3.9  CL 107  CO2 21*  GLUCOSE 98  BUN 15  CREATININE 1.23  CALCIUM 9.1  GFRNONAA 57*  GFRAA >60  ANIONGAP 12    Recent Labs  Lab 06/13/19 1435  PROT 6.3*  ALBUMIN 3.8  AST 28  ALT 24  ALKPHOS 44  BILITOT 0.7   Hematology Recent Labs  Lab 06/13/19 1435  WBC 10.6*  RBC 5.38  HGB 16.6  HCT 50.9  MCV 94.6  MCH 30.9  MCHC 32.6  RDW 13.0  PLT 210   BNPNo results for input(s): BNP, PROBNP in the last 168 hours.  DDimer No results for input(s): DDIMER in the last 168 hours.   Radiology/Studies:  Ct Head Wo Contrast  Result Date: 06/13/2019 CLINICAL DATA:  Syncopal episode. EXAM: CT HEAD WITHOUT CONTRAST TECHNIQUE: Contiguous axial images were obtained from the base of the skull through the vertex without intravenous contrast. COMPARISON:  None. FINDINGS: Brain: Mild age related cerebral atrophy and ventriculomegaly. No acute intracranial findings or mass lesions. No extra-axial fluid collections are identified. The gray-white differentiation is maintained. Vascular: No hyperdense vessel or unexpected calcification. Moderate vascular calcifications. Skull: Normal. Negative for fracture or focal lesion.  Sinuses/Orbits: The paranasal sinuses and mastoid air cells are clear. The globes are intact. Other: No scalp lesions or hematoma. IMPRESSION: Age related cerebral atrophy and ventriculomegaly. No acute intracranial findings or mass lesions. Electronically Signed   By: Marijo Sanes M.D.   On: 06/13/2019 15:20   Dg Chest Portable 1 View  Result Date: 06/13/2019 CLINICAL DATA:  Syncopal episode. EXAM: PORTABLE CHEST 1 VIEW COMPARISON:  None. FINDINGS: The cardiac silhouette, mediastinal and hilar contours are  within normal limits. There is mild tortuosity and calcification of the thoracic aorta. The lungs are clear. No pleural effusions. No worrisome pulmonary lesions. The bony thorax is intact. IMPRESSION: No acute cardiopulmonary findings. Electronically Signed   By: Marijo Sanes M.D.   On: 06/13/2019 15:49    Assessment and Plan:   1. 1.  Syncope: The patient had an episode of syncope that is most consistent with orthostatic hypotension.  He really had not had much to eat today.  I do not think he was adequately hydrated either.  He reports eating some but most of his drinks were caffeinated.  I agree with working him up for syncope.  He will be on telemetry monitor.  We will do an echocardiogram tomorrow.  We will anticipate sending him home with a 30-day monitor.  I have encouraged him to stay better hydrated and to make sure that he eats more protein.  I have encouraged him to eat protein for all 3 meals of the day which will help prevent episodes of orthostatic hypotension.  Continue his current medications.  2.  Hypertension: His blood pressure is a little high this evening.  He has been taking his metoprolol.  3.  Hyperlipidemia: Continue Crestor.  He typically has his lipids measured by his primary medical doctor.     For questions or updates, please contact [place] Please consult [email or web address] for contact info under     Signed, Mertie Moores, MD  06/13/2019 6:17 PM

## 2019-06-13 NOTE — ED Notes (Signed)
Patient transported to CT 

## 2019-06-13 NOTE — Progress Notes (Signed)
Pt eating meal tray at this time

## 2019-06-14 ENCOUNTER — Observation Stay (HOSPITAL_COMMUNITY): Payer: Medicare Other

## 2019-06-14 ENCOUNTER — Observation Stay (HOSPITAL_BASED_OUTPATIENT_CLINIC_OR_DEPARTMENT_OTHER): Payer: Medicare Other

## 2019-06-14 ENCOUNTER — Other Ambulatory Visit: Payer: Self-pay

## 2019-06-14 DIAGNOSIS — Z1159 Encounter for screening for other viral diseases: Secondary | ICD-10-CM | POA: Diagnosis not present

## 2019-06-14 DIAGNOSIS — M1990 Unspecified osteoarthritis, unspecified site: Secondary | ICD-10-CM | POA: Diagnosis not present

## 2019-06-14 DIAGNOSIS — I340 Nonrheumatic mitral (valve) insufficiency: Secondary | ICD-10-CM

## 2019-06-14 DIAGNOSIS — E782 Mixed hyperlipidemia: Secondary | ICD-10-CM | POA: Diagnosis not present

## 2019-06-14 DIAGNOSIS — R55 Syncope and collapse: Secondary | ICD-10-CM | POA: Diagnosis not present

## 2019-06-14 DIAGNOSIS — I2510 Atherosclerotic heart disease of native coronary artery without angina pectoris: Secondary | ICD-10-CM | POA: Diagnosis not present

## 2019-06-14 DIAGNOSIS — I951 Orthostatic hypotension: Secondary | ICD-10-CM | POA: Diagnosis not present

## 2019-06-14 LAB — CBC
HCT: 48.2 % (ref 39.0–52.0)
Hemoglobin: 15.8 g/dL (ref 13.0–17.0)
MCH: 31 pg (ref 26.0–34.0)
MCHC: 32.8 g/dL (ref 30.0–36.0)
MCV: 94.7 fL (ref 80.0–100.0)
Platelets: 188 10*3/uL (ref 150–400)
RBC: 5.09 MIL/uL (ref 4.22–5.81)
RDW: 13 % (ref 11.5–15.5)
WBC: 8.4 10*3/uL (ref 4.0–10.5)
nRBC: 0 % (ref 0.0–0.2)

## 2019-06-14 LAB — BASIC METABOLIC PANEL
Anion gap: 5 (ref 5–15)
BUN: 15 mg/dL (ref 8–23)
CO2: 27 mmol/L (ref 22–32)
Calcium: 8.8 mg/dL — ABNORMAL LOW (ref 8.9–10.3)
Chloride: 108 mmol/L (ref 98–111)
Creatinine, Ser: 1.21 mg/dL (ref 0.61–1.24)
GFR calc Af Amer: >60 mL/min (ref >60)
GFR calc non Af Amer: 59 mL/min — ABNORMAL LOW (ref >60)
Glucose, Bld: 143 mg/dL — ABNORMAL HIGH (ref 70–99)
Potassium: 3.8 mmol/L (ref 3.5–5.1)
Sodium: 140 mmol/L (ref 135–145)

## 2019-06-14 LAB — ECHOCARDIOGRAM COMPLETE
Height: 71.5 in
Weight: 3638.4 oz

## 2019-06-14 LAB — GLUCOSE, CAPILLARY
Glucose-Capillary: 69 mg/dL — ABNORMAL LOW (ref 70–99)
Glucose-Capillary: 109 mg/dL — ABNORMAL HIGH (ref 70–99)
Glucose-Capillary: 167 mg/dL — ABNORMAL HIGH (ref 70–99)

## 2019-06-14 MED ORDER — NITROGLYCERIN 0.4 MG SL SUBL: 0.4000 mg | SUBLINGUAL_TABLET | SUBLINGUAL | 0 refills | Status: DC | PRN

## 2019-06-14 NOTE — Evaluation (Signed)
Physical Therapy Evaluation/ Discharge Patient Details Name: Jason Giles MRN: [id number] DOB: May 26, 1946 Today's Date: 06/14/2019   History of Present Illness  73 yo admitted after syncopal event. PMhx: CAD, HTN, HLD, DM  Clinical Impression  Pt very pleasant and reported initial lightheadedness with transition from supine to sitting without significant drop with orthostatics. Pt reports no other falls in the last year and is able to care for himself at home. He enjoys playing with his grandkids and fishing. Pt at baseline functional level without further need for acute therapy at this time. Will sign off with pt aware and agreeable.   Supine 136/71, HR 73 Sitting 136/73, HR 77 Standing 125/63, HR 81 Standing 3 min 136/73, HR 91    Follow Up Recommendations No PT follow up    Equipment Recommendations  None recommended by PT    Recommendations for Other Services       Precautions / Restrictions Precautions Precautions: None      Mobility  Bed Mobility Overal bed mobility: Modified Independent                Transfers Overall transfer level: Modified independent                  Ambulation/Gait Ambulation/Gait assistance: Independent Gait Distance (Feet): 300 Feet Assistive device: None Gait Pattern/deviations: WFL(Within Functional Limits)   Gait velocity interpretation: >4.37 ft/sec, indicative of normal walking speed General Gait Details: pt with steady gait including head turns with good speed  Stairs            Wheelchair Mobility    Modified Rankin (Stroke Patients Only)       Balance Overall balance assessment: No apparent balance deficits (not formally assessed)                                           Pertinent Vitals/Pain Pain Assessment: No/denies pain    Home Living Family/patient expects to be discharged to:: Private residence Living Arrangements: Spouse/significant other Available Help at  Discharge: Family Type of Home: House Home Access: Stairs to enter   Technical brewer of Steps: 3 Home Layout: One level Home Equipment: Cane - single point      Prior Function Level of Independence: Independent               Hand Dominance        Extremity/Trunk Assessment   Upper Extremity Assessment Upper Extremity Assessment: Overall WFL for tasks assessed    Lower Extremity Assessment Lower Extremity Assessment: Overall WFL for tasks assessed    Cervical / Trunk Assessment Cervical / Trunk Assessment: Other exceptions Cervical / Trunk Exceptions: rounded shoulders  Communication   Communication: No difficulties  Cognition Arousal/Alertness: Awake/alert Behavior During Therapy: WFL for tasks assessed/performed Overall Cognitive Status: Within Functional Limits for tasks assessed                                        General Comments      Exercises     Assessment/Plan    PT Assessment Patent does not need any further PT services  PT Problem List         PT Treatment Interventions      PT Goals (Current goals can be found in the Care Plan  section)  Acute Rehab PT Goals PT Goal Formulation: All assessment and education complete, DC therapy    Frequency     Barriers to discharge        Co-evaluation               AM-PAC PT "6 Clicks" Mobility  Outcome Measure Help needed turning from your back to your side while in a flat bed without using bedrails?: None Help needed moving from lying on your back to sitting on the side of a flat bed without using bedrails?: None Help needed moving to and from a bed to a chair (including a wheelchair)?: None Help needed standing up from a chair using your arms (e.g., wheelchair or bedside chair)?: None Help needed to walk in hospital room?: None Help needed climbing 3-5 steps with a railing? : None 6 Click Score: 24    End of Session Equipment Utilized During Treatment: Gait  belt Activity Tolerance: Patient tolerated treatment well Patient left: in chair;with call bell/phone within reach Nurse Communication: Mobility status PT Visit Diagnosis: Other abnormalities of gait and mobility (R26.89)    Time: 2025-4270 PT Time Calculation (min) (ACUTE ONLY): 25 min   Charges:   PT Evaluation $PT Eval Low Complexity: [place], PT Acute Rehabilitation Services Pager: [phone] Office: [place] Shavon Zenz 06/14/2019, 1:25 PM

## 2019-06-14 NOTE — Progress Notes (Signed)
Pt's CBG is low at 69. Snack and drink given to the patient. Will continue monitoring.

## 2019-06-14 NOTE — Progress Notes (Signed)
Patient discharged: Home   Via: Wheelchair   Discharge paperwork given: to patient   Reviewed with teach back  IV and telemetry disconnected  Belongings given to patient     

## 2019-06-14 NOTE — Progress Notes (Signed)
Echocardiogram 2D Echocardiogram has been performed.  Oneal Deputy Verle Brillhart 06/14/2019, 10:08 AM

## 2019-06-14 NOTE — Progress Notes (Signed)
Progress Note  Patient Name: Jason Giles Date of Encounter: 06/14/2019  Primary Cardiologist: Mertie Moores, MD   Subjective   73 year old gentleman who was admitted last night after having an episode of syncope.  He has a history of hypertension, coronary artery disease, hyperlipidemia.  In talking with him, his symptoms were consistent with orthostatic hypotension.  He had not had much to eat or drink that day.  He got up out of the car to open the door for his wife and collapsed after walking 3-4 steps.  He woke up immediately.  There was no seizure activity.  He is not had any arrhythmias.  He felt completely well when I examined him here in the hospital.  Troponin levels are negative x2.  He did report some dizziness with standing yesterday .  Seems better after IV hydration.   Inpatient Medications    Scheduled Meds:  aspirin EC  81 mg Oral Daily   clopidogrel  75 mg Oral Daily   enoxaparin (LOVENOX) injection  40 mg Subcutaneous Q24H   fenofibrate  160 mg Oral Daily   insulin aspart  0-5 Units Subcutaneous QHS   insulin aspart  0-9 Units Subcutaneous TID WC   insulin pump   Subcutaneous TID AC, HS, 0200   metoprolol succinate  25 mg Oral Daily   multivitamin with minerals  1 tablet Oral Daily   rosuvastatin  40 mg Oral Daily   Continuous Infusions:  lactated ringers 75 mL/hr at 06/14/19 0723   PRN Meds: acetaminophen, colchicine, hydrALAZINE, nitroGLYCERIN, ondansetron (ZOFRAN) IV   Vital Signs    Vitals:   06/13/19 1826 06/13/19 1941 06/14/19 0616 06/14/19 0837  BP: (!) 153/87 (!) 161/84 140/76 136/74  Pulse: 71 77 77 79  Resp: 16 18 18    Temp: 98.7 F (37.1 C) 98.1 F (36.7 C) 98.1 F (36.7 C)   TempSrc: Oral Oral Oral   SpO2: 100% 99% 94%   Weight:   103.1 kg   Height:        Intake/Output Summary (Last 24 hours) at 06/14/2019 1036 Last data filed at 06/14/2019 0859 Gross per 24 hour  Intake 1951.25 ml  Output 2750 ml  Net -798.75 ml     Last 3 Weights 06/14/2019 06/13/2019 10/21/2018  Weight (lbs) 227 lb 6.4 oz 225 lb 234 lb  Weight (kg) 103.148 kg 102.059 kg 106.142 kg      Telemetry    NSR , no significant arrhythmias  - Personally Reviewed  ECG     - Personally Reviewed  Physical Exam   GEN: No acute distress.   Neck: No JVD Cardiac: RRR, no murmurs, rubs, or gallops.  Respiratory: Clear to auscultation bilaterally. GI: Soft, nontender, non-distended  MS: No edema; No deformity. Neuro:  Nonfocal  Psych: Normal affect   Labs    High Sensitivity Troponin:   Recent Labs  Lab 06/13/19 1435 06/13/19 1636  TROPONINIHS 11 12      Cardiac EnzymesNo results for input(s): TROPONINI in the last 168 hours. No results for input(s): TROPIPOC in the last 168 hours.   Chemistry Recent Labs  Lab 06/13/19 1435 06/14/19 0541  NA 140 140  K 3.9 3.8  CL 107 108  CO2 21* 27  GLUCOSE 98 143*  BUN 15 15  CREATININE 1.23 1.21  CALCIUM 9.1 8.8*  PROT 6.3*  --   ALBUMIN 3.8  --   AST 28  --   ALT 24  --   ALKPHOS 44  --  BILITOT 0.7  --   GFRNONAA 57* 59*  GFRAA >60 >60  ANIONGAP 12 5     Hematology Recent Labs  Lab 06/13/19 1435 06/14/19 0541  WBC 10.6* 8.4  RBC 5.38 5.09  HGB 16.6 15.8  HCT 50.9 48.2  MCV 94.6 94.7  MCH 30.9 31.0  MCHC 32.6 32.8  RDW 13.0 13.0  PLT 210 188    BNPNo results for input(s): BNP, PROBNP in the last 168 hours.   DDimer No results for input(s): DDIMER in the last 168 hours.   Radiology    Ct Head Wo Contrast  Result Date: 06/13/2019 CLINICAL DATA:  Syncopal episode. EXAM: CT HEAD WITHOUT CONTRAST TECHNIQUE: Contiguous axial images were obtained from the base of the skull through the vertex without intravenous contrast. COMPARISON:  None. FINDINGS: Brain: Mild age related cerebral atrophy and ventriculomegaly. No acute intracranial findings or mass lesions. No extra-axial fluid collections are identified. The gray-white differentiation is maintained. Vascular:  No hyperdense vessel or unexpected calcification. Moderate vascular calcifications. Skull: Normal. Negative for fracture or focal lesion. Sinuses/Orbits: The paranasal sinuses and mastoid air cells are clear. The globes are intact. Other: No scalp lesions or hematoma. IMPRESSION: Age related cerebral atrophy and ventriculomegaly. No acute intracranial findings or mass lesions. Electronically Signed   By: Marijo Sanes M.D.   On: 06/13/2019 15:20   Dg Chest Portable 1 View  Result Date: 06/13/2019 CLINICAL DATA:  Syncopal episode. EXAM: PORTABLE CHEST 1 VIEW COMPARISON:  None. FINDINGS: The cardiac silhouette, mediastinal and hilar contours are within normal limits. There is mild tortuosity and calcification of the thoracic aorta. The lungs are clear. No pleural effusions. No worrisome pulmonary lesions. The bony thorax is intact. IMPRESSION: No acute cardiopulmonary findings. Electronically Signed   By: Marijo Sanes M.D.   On: 06/13/2019 15:49    Cardiac Studies     Patient Profile     73 y.o. male  With CAD, HTN, hyperlipidemia.  Admitted after passing out   Assessment & Plan    1.  Syncope: Symptoms are consistent with orthostatic hypotension.  He admits that he had not eaten very well yesterday and certainly did not have much to drink.  Encouraged him to eat protein with each meal.  Needs to drink more fluids..  His monitor is completely benign.  From a cardiac standpoint, I think that he can be discharged later today.  We will arrange to have a 30-day monitor available for him to pick up later this week.   [facility] will sign off.   Medication Recommendations:   Other recommendations (labs, testing, etc):   Follow up as an outpatient:  Will arrange for a 30 day monitor .   For questions or updates, please contact Ruth Please consult [email or web address] for contact info under        Signed, Mertie Moores, MD  06/14/2019, 10:36 AM

## 2019-06-14 NOTE — Discharge Summary (Signed)
Discharge Summary  Jason Giles:[id number] DOB: 04-10-1946  PCP: Haywood Pao, MD  Admit date: 06/13/2019 Discharge date: 06/14/2019  Time spent: 35 minutes  Recommendations for Outpatient Follow-up:  1. Follow-up with your cardiologist within a week 2. Follow-up with your primary care provider 3. Take your medications as prescribed.  Discharge Diagnoses:  Active Hospital Problems   Diagnosis Date Noted  . Syncope 06/13/2019    Resolved Hospital Problems  No resolved problems to display.    Discharge Condition: Stable  Diet recommendation: Recommended protein with each meal.  Needs to drink more fluids.  Vitals:   06/14/19 0837 06/14/19 1139  BP: 136/74 (!) 154/78  Pulse: 79 71  Resp:  20  Temp:  98 F (36.7 C)  SpO2:  97%    History of present illness:  HPI: Jason Giles is a 73 y.o. male with medical history significant for coronary artery disease status post PCI with stent in 2011, first-degree AV block, type 2 diabetes on insulin pump, essential hypertension, hyperlipidemia, obesity, who presented to [facility] ED via EMS due to loss of consciousness witnessed by his wife.  Event happened today around noon outside of a store, patient was assisting his wife with loading items when he suddenly collapsed.  Loss of consciousness lasted approximately 2 minutes.  No seizure activity.  No postictal symptoms.  Quickly had a return to normal.  EMS arrived, blood sugar normal.  No significant arrhythmia.  No prior history of syncope or seizure.  Was in his usual state of health prior to this.  No recent changes in his medications, no diuretic use, no changes in lifestyle.  Sees his cardiologist once a year, last appointment was in December.  EMS brought the patient to the ED for further evaluation.  [facility] called to admit for syncope work-up.  ED Course: On presentation to the ED, vital signs remarkable for mildly elevated blood pressure.  Lab studies essentially unremarkable,  troponin negative.  Patient is at baseline.  CT head unremarkable for any acute intracranial abnormality.  Twelve-lead EKG showing first-degree AV block.  In talking with cardiology, his symptoms were consistent with orthostatic hypotension.  He has not had much to eat or drink that day.  Troponin levels negative x2.  06/14/19: Patient was seen and examined at his bedside this morning.  No acute events overnight.  Feels at his baseline this morning.  Seen by cardiology and okay to discharge from a cardiology standpoint.  Vital signs and labs reviewed and are stable.  Patient is medically stable for discharge.  On the day of discharge, the patient was hemodynamically stable.  He will need to follow-up with cardiology for a 30-day cardiac event monitor which will be available for him to pick up later this week per cardiology.     Hospital Course:  Active Problems:   Syncope  Newly diagnosed syncope, likely orthostatic hypotension Presented after sudden loss of consciousness and sudden return to normal No seizure like activity, No postictal symptoms, No hypoglycemia, No anemia No sign of infective process, chest x-ray, urine analysis unremarkable, afebrile with no leukocytosis Asymptomatic in the ED Admitted for syncope work-up Not much to eat or drink that day, consistent with orthostatic hypotension. Obtain 2D echo, bilateral carotid Doppler ultrasound, repeat twelve-lead EKG  No changes on telemetry Positive orthostatic vital signs Received IV fluid hydration LR at 75 cc/h TSH normal Follow-up with cardiology outpatient for 30-day cardiac event monitor Recommend proteins with each meal and to avoid  dehydration  Coronary artery disease status post PCI with stenting in 2011 Continue aspirin, Plavix, Crestor No anginal symptoms Follow-up with your cardiologist outpatient  Type 2 diabetes, on insulin pump with hyperglycemia Continue insulin pump, has adequate insulin supply Sensitive  insulin sliding scale Hemoglobin A1c 7.7 on 06/13/2019 Follow-up with your primary care provider  First-degree AV block Chronic Personally reviewed twelve-lead EKG done on admission which showed sinus rhythm with first-degree AV block Continue to monitor rhythm on telemetry Follow-up with your cardiologist  Hyperlipidemia Continue home Crestor  Essential hypertension Continue Toprol-XL  Obesity BMI 30 Recommend weight loss outpatient with healthy dieting and regular physical activity PT OT, per OT no needs identified.  CKD 3 Renal function is at baseline Continue to avoid nephrotoxins Follow-up with your PCP     Code Status: Full code, as confirmed by the patient.   Consults called: Cardiology, Dr. Acie Fredrickson.     Discharge Exam: BP (!) 154/78 (BP Location: Right Arm)   Pulse 71   Temp 98 F (36.7 C) (Oral)   Resp 20   Ht 5' 11.5" (1.816 m)   Wt 103.1 kg   SpO2 97%   BMI 31.27 kg/m  . General: 73 y.o. year-old male well developed well nourished in no acute distress.  Alert and oriented x3.  Mild irritation of left sclera, nontender, not painful, present on admission. . Cardiovascular: Regular rate and rhythm with no rubs or gallops.  No thyromegaly or JVD noted.   Marland Kitchen Respiratory: Clear to auscultation with no wheezes or rales. Good inspiratory effort. . Abdomen: Soft nontender nondistended with normal bowel sounds x4 quadrants. . Musculoskeletal: No lower extremity edema. 2/4 pulses in all 4 extremities. Marland Kitchen Psychiatry: Mood is appropriate for condition and setting  Discharge Instructions You were cared for by a hospitalist during your hospital stay. If you have any questions about your discharge medications or the care you received while you were in the hospital after you are discharged, you can call the unit and asked to speak with the hospitalist on call if the hospitalist that took care of you is not available. Once you are discharged, your primary care  physician will handle any further medical issues. Please note that NO REFILLS for any discharge medications will be authorized once you are discharged, as it is imperative that you return to your primary care physician (or establish a relationship with a primary care physician if you do not have one) for your aftercare needs so that they can reassess your need for medications and monitor your lab values.   Allergies as of 06/14/2019   No Known Allergies     Medication List    TAKE these medications   acetaminophen 325 MG tablet Commonly known as: TYLENOL Take 325 mg by mouth every 6 (six) hours as needed for mild pain or headache.   aspirin 81 MG tablet Take 81 mg by mouth daily.   Colcrys 0.6 MG tablet Generic drug: colchicine Take 0.6 mg by mouth See admin instructions. Take 0.6 mg by mouth once a day for 2-3 days as directed for gout flares   fenofibrate micronized 134 MG capsule Commonly known as: LOFIBRA Take 134 mg by mouth daily before breakfast.   insulin aspart 100 UNIT/ML injection Commonly known as: novoLOG Inject into the skin continuous. Per pump   insulin pump Soln Inject into the skin continuous. Using Novolog   Jardiance 25 MG Tabs tablet Generic drug: empagliflozin Take 25 mg by mouth daily.  metFORMIN 850 MG tablet Commonly known as: GLUCOPHAGE Take 850 mg by mouth 2 (two) times daily with a meal.   metoprolol succinate 25 MG 24 hr tablet Commonly known as: TOPROL-XL Take 25 mg by mouth daily.   multivitamin Tabs tablet Take 1 tablet by mouth daily.   nitroGLYCERIN 0.4 MG SL tablet Commonly known as: NITROSTAT Place 1 tablet (0.4 mg total) under the tongue every 5 (five) minutes as needed for chest pain.   Plavix 75 MG tablet Generic drug: clopidogrel Take 75 mg by mouth Daily.   rosuvastatin 40 MG tablet Commonly known as: CRESTOR Take 40 mg by mouth daily.      No Known Allergies Follow-up Information    Tisovec, Fransico Him, MD. Call in  1 day(s).   Specialty: Internal Medicine Why: Please call for a post hospital follow-up appointment. Contact information: [place] [address] [phone]        Nahser, Wonda Cheng, MD .   Specialty: Cardiology Contact information: [place] [address] [phone]            The results of significant diagnostics from this hospitalization (including imaging, microbiology, ancillary and laboratory) are listed below for reference.    Significant Diagnostic Studies: Ct Head Wo Contrast  Result Date: 06/13/2019 CLINICAL DATA:  Syncopal episode. EXAM: CT HEAD WITHOUT CONTRAST TECHNIQUE: Contiguous axial images were obtained from the base of the skull through the vertex without intravenous contrast. COMPARISON:  None. FINDINGS: Brain: Mild age related cerebral atrophy and ventriculomegaly. No acute intracranial findings or mass lesions. No extra-axial fluid collections are identified. The gray-white differentiation is maintained. Vascular: No hyperdense vessel or unexpected calcification. Moderate vascular calcifications. Skull: Normal. Negative for fracture or focal lesion. Sinuses/Orbits: The paranasal sinuses and mastoid air cells are clear. The globes are intact. Other: No scalp lesions or hematoma. IMPRESSION: Age related cerebral atrophy and ventriculomegaly. No acute intracranial findings or mass lesions. Electronically Signed   By: Marijo Sanes M.D.   On: 06/13/2019 15:20   Dg Chest Portable 1 View  Result Date: 06/13/2019 CLINICAL DATA:  Syncopal episode. EXAM: PORTABLE CHEST 1 VIEW COMPARISON:  None. FINDINGS: The cardiac silhouette, mediastinal and hilar contours are within normal limits. There is mild tortuosity and calcification of the thoracic aorta. The lungs are clear. No pleural effusions. No worrisome pulmonary lesions. The bony thorax is intact. IMPRESSION: No acute cardiopulmonary findings. Electronically Signed   By: Marijo Sanes M.D.   On: 06/13/2019 15:49    Microbiology: Recent Results (from the past 240 hour(s))  SARS Coronavirus 2 [facility] order, Performed in [place] hospital lab)     Status: None   Collection Time: 06/13/19  4:17 PM  Result Value Ref Range Status   SARS Coronavirus 2 NEGATIVE NEGATIVE Final    Comment: (NOTE) If result is NEGATIVE SARS-CoV-2 target nucleic acids are NOT DETECTED. The SARS-CoV-2 RNA is generally detectable in upper and lower  respiratory specimens during the acute phase of infection. The lowest  concentration of SARS-CoV-2 viral copies this assay can detect is 250  copies / mL. A negative result does not preclude SARS-CoV-2 infection  and should not be used as the sole basis for treatment or other  patient management decisions.  A negative result may occur with  improper specimen collection / handling, submission of specimen other  than nasopharyngeal swab, presence of viral mutation(s) within the  areas targeted by this assay, and inadequate number of  viral copies  (<250 copies / mL). A negative result must be combined with clinical  observations, patient history, and epidemiological information. If result is POSITIVE SARS-CoV-2 target nucleic acids are DETECTED. The SARS-CoV-2 RNA is generally detectable in upper and lower  respiratory specimens dur ing the acute phase of infection.  Positive  results are indicative of active infection with SARS-CoV-2.  Clinical  correlation with patient history and other diagnostic information is  necessary to determine patient infection status.  Positive results do  not rule out bacterial infection or co-infection with other viruses. If result is PRESUMPTIVE POSTIVE SARS-CoV-2 nucleic acids MAY BE PRESENT.   A presumptive positive result was obtained on the submitted specimen  and confirmed on repeat testing.  While 2019 novel coronavirus  (SARS-CoV-2) nucleic acids may be present in the submitted sample   additional confirmatory testing may be necessary for epidemiological  and / or clinical management purposes  to differentiate between  SARS-CoV-2 and other Sarbecovirus currently known to infect humans.  If clinically indicated additional testing with an alternate test  methodology [phone]) is advised. The SARS-CoV-2 RNA is generally  detectable in upper and lower respiratory sp ecimens during the acute  phase of infection. The expected result is Negative. Fact Sheet for Patients:  StrictlyIdeas.no Fact Sheet for Healthcare Providers: BankingDealers.co.za This test is not yet approved or cleared by the [place] FDA and has been authorized for detection and/or diagnosis of SARS-CoV-2 by FDA under an Emergency Use Authorization (EUA).  This EUA will remain in effect (meaning this test can be used) for the duration of the COVID-19 declaration under Section 564(b)(1) of the Act, 21 U.S.C. section 360bbb-3(b)(1), unless the authorization is terminated or revoked sooner. Performed at [facility], [place] [address], [place], [place] [address]      Labs: Basic Metabolic Panel: Recent Labs  Lab 06/13/19 1435 06/14/19 0541  NA 140 140  K 3.9 3.8  CL 107 108  CO2 21* 27  GLUCOSE 98 143*  BUN 15 15  CREATININE 1.23 1.21  CALCIUM 9.1 8.8*   Liver Function Tests: Recent Labs  Lab 06/13/19 1435  AST 28  ALT 24  ALKPHOS 44  BILITOT 0.7  PROT 6.3*  ALBUMIN 3.8   No results for input(s): LIPASE, AMYLASE in the last 168 hours. No results for input(s): AMMONIA in the last 168 hours. CBC: Recent Labs  Lab 06/13/19 1435 06/14/19 0541  WBC 10.6* 8.4  NEUTROABS 8.0*  --   HGB 16.6 15.8  HCT 50.9 48.2  MCV 94.6 94.7  PLT 210 188   Cardiac Enzymes: No results for input(s): CKTOTAL, CKMB, CKMBINDEX, TROPONINI in the last 168 hours. BNP: BNP (last 3 results) No results for input(s): BNP in the last 8760 hours.  ProBNP  (last 3 results) No results for input(s): PROBNP in the last 8760 hours.  CBG: Recent Labs  Lab 06/13/19 1942 06/13/19 2153 06/14/19 0210 06/14/19 0720 06/14/19 1136  GLUCAP 147* 119* 69* 109* 167*       Signed:  Kayleen Memos, MD [facility] 06/14/2019, 1:04 PM

## 2019-06-14 NOTE — Progress Notes (Signed)
Inpatient Diabetes Program Recommendations  AACE/ADA: New Consensus Statement on Inpatient Glycemic Control (2015)  Target Ranges:  Prepandial:   less than 140 mg/dL      Peak postprandial:   less than 180 mg/dL (1-2 hours)      Critically ill patients:  140 - 180 mg/dL   Lab Results  Component Value Date   GLUCAP 109 (H) 06/14/2019   HGBA1C 7.7 (H) 06/13/2019    Review of Glycemic Control Results for DONDI, AIME (MRN [id number]) as of 06/14/2019 10:22  Ref. Range 06/13/2019 19:01 06/13/2019 19:42 06/13/2019 21:53 06/14/2019 02:10 06/14/2019 07:20  Glucose-Capillary Latest Ref Range: 70 - 99 mg/dL 65 (L) 147 (H) 119 (H) 69 (L) 109 (H)   Diabetes history: Type 2 DM Outpatient Diabetes medications: Novolog per insulin pump, Metformin 850 mg BID Current orders for Inpatient glycemic control: Insulin pump, Novolog 0-9 units TID, Novolog 0-5 units QHS  Inpatient Diabetes Program Recommendations:    Noted consult and patient has insulin pump and supplies. In agreement with current orders. Noted mild hypoglycemia of 58 mg/dL, assuming patient had not eaten however will clarify with patient.   Spoke with patient regarding diabetes management. He is followed by Dr Osborne Casco and sees [facility] for insulin pump adjustments. Denies having lows at home and expresses good understanding of diabetes. Wife to bring more supplies. Reviewed patient's current A1c of 7.7%. Explained what a A1c is and what it measures. Also reviewed goal A1c with patient, importance of good glucose control @ home, and blood sugar goals.  Current insulin pump settings are as follows:  Basal insulin  Total daily basal insulin: 89.6 units/24 hours  Carb Coverage- Patient changes this based on carbohydrate count per meal 1:4-5 1 unit for every 4-5 grams of carbohydrates  Target Glucose Goals 0000-0000 120 mg/dl  Patient verbalized understanding of information discussed and states that he does not have any further questions  related to diabetes at this time. Patient to follow up with [facility] in the next few weeks.  NURSING: Once insulin pump order set is ordered please print off the Patient insulin pump contract and flow sheet. The insulin pump contract should be signed by the patient and then placed in the chart. The patient insulin pump flow sheet will be completed by the patient at the bedside and the RN caring for the patient will use the patient's flow sheet to document in the [facility]. RN will need to complete the Nursing Insulin Pump Flowsheet at least once a shift. Patient will need to keep extra insulin pump supplies at the bedside at all times.   Thanks, Bronson Curb, MSN, RNC-OB Diabetes Coordinator [phone] (8a-5p)

## 2019-06-14 NOTE — Discharge Instructions (Signed)
Syncope °Syncope is when you pass out (faint) for a short time. It is caused by a sudden decrease in blood flow to the brain. Signs that you may be about to pass out include: °· Feeling dizzy or light-headed. °· Feeling sick to your stomach (nauseous). °· Seeing all white or all black. °· Having cold, clammy skin. °If you pass out, get help right away. Call your local emergency services (911 in the [place] Do not drive yourself to the hospital. °Follow these instructions at home: °Watch for any changes in your symptoms. Take these actions to stay safe and help with your symptoms: °Lifestyle °· Do not drive, use machinery, or play sports until your doctor says it is okay. °· Do not drink alcohol. °· Do not use any products that contain nicotine or tobacco, such as cigarettes and e-cigarettes. If you need help quitting, ask your doctor. °· Drink enough fluid to keep your pee (urine) pale yellow. °General instructions °· Take over-the-counter and prescription medicines only as told by your doctor. °· If you are taking blood pressure or heart medicine, sit up and stand up slowly. Spend a few minutes getting ready to sit and then stand. This can help you feel less dizzy. °· Have someone stay with you until you feel stable. °· If you start to feel like you might pass out, lie down right away and raise (elevate) your feet above the level of your heart. Breathe deeply and steadily. Wait until all of the symptoms are gone. °· Keep all follow-up visits as told by your doctor. This is important. °Get help right away if: °· You have a very bad headache. °· You pass out once or more than once. °· You have pain in your chest, belly, or back. °· You have a very fast or uneven heartbeat (palpitations). °· It hurts to breathe. °· You are bleeding from your mouth or your bottom (rectum). °· You have black or tarry poop (stool). °· You have jerky movements that you cannot control (seizure). °· You are confused. °· You have trouble  walking. °· You are very weak. °· You have vision problems. °These symptoms may be an emergency. Do not wait to see if the symptoms will go away. Get medical help right away. Call your local emergency services (911 in the [place] Do not drive yourself to the hospital. °Summary °· Syncope is when you pass out (faint) for a short time. It is caused by a sudden decrease in blood flow to the brain. °· Signs that you may be about to faint include feeling dizzy, light-headed, or sick to your stomach, seeing all white or all black, or having cold, clammy skin. °· If you start to feel like you might pass out, lie down right away and raise (elevate) your feet above the level of your heart. Breathe deeply and steadily. Wait until all of the symptoms are gone. °This information is not intended to replace advice given to you by your health care provider. Make sure you discuss any questions you have with your health care provider. °Document Released: 04/17/2008 Document Revised: 12/12/2017 Document Reviewed: 12/12/2017 °Elsevier Patient Education © 2020 Elsevier Inc. ° °

## 2019-06-14 NOTE — Progress Notes (Signed)
OT Cancellation Note  Patient Details Name: Jason Giles MRN: [id number] DOB: Mar 31, 1946   Cancelled Treatment:    Reason Eval/Treat Not Completed: OT screened, no needs identified, will sign off. Per PT, pt's orthostatics were negative and pt is functioning at modified independent level in all aspects. No OT needs identified, will sign off.   Dorinda Hill OTR/L Acute Rehabilitation Services Office: [place] 06/14/2019, 8:30 AM

## 2019-06-18 DIAGNOSIS — R55 Syncope and collapse: Secondary | ICD-10-CM | POA: Diagnosis not present

## 2019-06-18 DIAGNOSIS — E118 Type 2 diabetes mellitus with unspecified complications: Secondary | ICD-10-CM | POA: Diagnosis not present

## 2019-06-18 DIAGNOSIS — I2510 Atherosclerotic heart disease of native coronary artery without angina pectoris: Secondary | ICD-10-CM | POA: Diagnosis not present

## 2019-06-18 DIAGNOSIS — I1310 Hypertensive heart and chronic kidney disease without heart failure, with stage 1 through stage 4 chronic kidney disease, or unspecified chronic kidney disease: Secondary | ICD-10-CM | POA: Diagnosis not present

## 2019-06-19 ENCOUNTER — Telehealth: Payer: Self-pay | Admitting: Nurse Practitioner

## 2019-06-19 ENCOUNTER — Telehealth: Payer: Self-pay | Admitting: Radiology

## 2019-06-19 DIAGNOSIS — E11319 Type 2 diabetes mellitus with unspecified diabetic retinopathy without macular edema: Secondary | ICD-10-CM | POA: Diagnosis not present

## 2019-06-19 DIAGNOSIS — R55 Syncope and collapse: Secondary | ICD-10-CM

## 2019-06-19 DIAGNOSIS — I1310 Hypertensive heart and chronic kidney disease without heart failure, with stage 1 through stage 4 chronic kidney disease, or unspecified chronic kidney disease: Secondary | ICD-10-CM | POA: Diagnosis not present

## 2019-06-19 DIAGNOSIS — Z794 Long term (current) use of insulin: Secondary | ICD-10-CM | POA: Diagnosis not present

## 2019-06-19 DIAGNOSIS — Z4681 Encounter for fitting and adjustment of insulin pump: Secondary | ICD-10-CM | POA: Diagnosis not present

## 2019-06-19 NOTE — Telephone Encounter (Signed)
Enrolled patient for a 30 Day Preventice Event Monitor to be mailed. Brief instructions were gone over with the patient and he knows to expect the monitor to arrive in 3-4 days,

## 2019-06-19 NOTE — Telephone Encounter (Signed)
Called patient to discuss plan of care per Dr. Acie Fredrickson. Ordered 30 day event monitor for patient and cancelled appointment at our office for tomorrow. Patient is scheduled to see Dr. Acie Fredrickson on 9/15 and is aware to complete monitoring period prior to that date. He is aware someone from our office will call him with instructions on the event monitor and I advised him to call back with questions or concerns. He states he has been very careful to stay well hydrated since discharge from the hospital and will continue to monitor for symptoms of pre-syncope. He thanked me for the call.

## 2019-06-20 ENCOUNTER — Ambulatory Visit: Payer: Medicare Other | Admitting: Cardiology

## 2019-06-25 ENCOUNTER — Ambulatory Visit (INDEPENDENT_AMBULATORY_CARE_PROVIDER_SITE_OTHER): Payer: Medicare Other

## 2019-06-25 DIAGNOSIS — R55 Syncope and collapse: Secondary | ICD-10-CM | POA: Diagnosis not present

## 2019-06-30 DIAGNOSIS — E1165 Type 2 diabetes mellitus with hyperglycemia: Secondary | ICD-10-CM | POA: Diagnosis not present

## 2019-07-18 DIAGNOSIS — E119 Type 2 diabetes mellitus without complications: Secondary | ICD-10-CM | POA: Diagnosis not present

## 2019-07-18 DIAGNOSIS — Z794 Long term (current) use of insulin: Secondary | ICD-10-CM | POA: Diagnosis not present

## 2019-07-28 NOTE — Progress Notes (Signed)
Jason Giles Date of Birth  17-Sep-1946 [facility] [id number]     [place], [place]  [address] [phone]  Fax  [phone]  Problem List 1. CAD - stenting 2011 ( Taxus DES in May , 2012)  2. Diabetes Mellitus  3. Essential Hypertension     Previous Notes:   Pt is doing well.  Has retired since last visit. No chest pain .  He denies any angina or dyspnea.  Dec. 8.2015:  Jason Giles is a 73 yo who I follow for CAD, HTN, hyperlipidemia.  He went to the old office today. No having any problems. Is scheduled to have a colonoscopy and cataract surgery.     Oct. 4, 2016:  Jason Giles is doing well.   No CP or dyspnea.   Has been having some left arm discomfort.   Noticed it when he got up from bed to use the bathroom.   No pain .   Possibly overworked it the day before .  Had done some heavy lifting the day of this discomfort. Has felt fine since.  Has been doing this normal activities since   Oct. 31, 2017:   Jason Giles is seen back for follow up of his coronary artery disease.  He has a history of essential hypertension and diabetes.  We discussed Jason Giles.   His brother was the tennis coach up in [place] and he knows [place] .   No CP or dyspnea. Jason Giles to the dermatology - Basal cell CA on his nose Gets eye injections every 4 weeks  ( Macular degeneration )   Dec. 6, 2018:  Jason Giles is seen today for a follow-up visit.  He has a history of coronary artery disease-status post stenting  Still having issues with his eyes  No CP , no dyspnea.    Dec. 9, 2019:  Doing well .    Hx of PCI  - no angina  Needs to work on [employer]. Today is 234 lbs.   Labs from [facility]'s office  Chol = 101 HDL = 31 LDL = 44 Trigs = 128.   Sept. 15, 2020  Jason Giles is seen today for further evaluation of his CAD and HTN  He was in the hospital following an episode of syncope in August.  Clinically I thought that his symptoms were consistent with  orthostatic hypotension.  He had not eaten or had much to drink earlier that day.  We have encouraged him to drink more fluids. We have ordered a cardiac event monitor.  He has still had some lightheadedness.  Gets dizzy when he gets up in the am .   Current Outpatient Medications on File Prior to Visit  Medication Sig Dispense Refill  . acetaminophen (TYLENOL) 325 MG tablet Take 325 mg by mouth every 6 (six) hours as needed for mild pain or headache.    Marland Kitchen aspirin 81 MG tablet Take 81 mg by mouth daily.      Marland Kitchen COLCRYS 0.6 MG tablet Take 0.6 mg by mouth See admin instructions. Take 0.6 mg by mouth once a day for 2-3 days as directed for gout flares    . fenofibrate micronized (LOFIBRA) 134 MG capsule Take 134 mg by mouth daily before breakfast.     . insulin aspart (NOVOLOG) 100 UNIT/ML injection Inject into the skin continuous. Per pump    . Insulin Human (INSULIN PUMP) SOLN Inject into the skin continuous. Using Novolog    . JARDIANCE  25 MG TABS tablet Take 25 mg by mouth daily.   3  . metFORMIN (GLUCOPHAGE) 850 MG tablet Take 850 mg by mouth 2 (two) times daily with a meal.     . metoprolol succinate (TOPROL-XL) 25 MG 24 hr tablet Take 25 mg by mouth daily.     . multivitamin (ONE-A-DAY MEN'S) TABS tablet Take 1 tablet by mouth daily.    . nitroGLYCERIN (NITROSTAT) 0.4 MG SL tablet Place 1 tablet (0.4 mg total) under the tongue every 5 (five) minutes as needed for chest pain. 25 tablet 0  . PLAVIX 75 MG tablet Take 75 mg by mouth Daily.     . rosuvastatin (CRESTOR) 40 MG tablet Take 40 mg by mouth daily.     No current facility-administered medications on file prior to visit.     No Known Allergies  Past Medical History:  Diagnosis Date  . Arthritis   . Colon polyp    adenomatous  . Coronary artery disease    post PTCA and stenting  . CORONARY ATHEROSCLEROSIS NATIVE CORONARY ARTERY 12/21/2009  . Diabetes mellitus   . Diverticulosis   . Hyperlipidemia   . Hypertension   . Kidney  stones     Past Surgical History:  Procedure Laterality Date  . CORONARY ANGIOPLASTY WITH STENT PLACEMENT     left circumflex artery    Social History   Tobacco Use  Smoking Status Former Smoker  . Quit date: 11/13/1985  . Years since quitting: 33.7  Smokeless Tobacco Never Used    Social History   Substance and Sexual Activity  Alcohol Use Yes  . Alcohol/week: 0.0 standard drinks   Comment: Occassionally    Family History  Problem Relation Age of Onset  . Cancer Father        Brain Tumor  . Colon cancer Mother   . Colon polyps Neg Hx   . Kidney disease Neg Hx   . Diabetes Neg Hx   . Esophageal cancer Neg Hx     Reviw of Systems:  Reviewed in the HPI.  All other systems are negative.  Physical Exam: There were no vitals taken for this visit.  GEN:  Well nourished, well developed in no acute distress HEENT: Normal NECK: No JVD; No carotid bruits LYMPHATICS: No lymphadenopathy CARDIAC: RRR \, no murmurs, rubs, gallops RESPIRATORY:  Clear to auscultation without rales, wheezing or rhonchi  ABDOMEN: Soft, non-tender, non-distended MUSCULOSKELETAL:  No edema; No deformity  SKIN: Warm and dry NEUROLOGIC:  Alert and oriented x 3   ECG:   Assessment / Plan:   1. CAD - stenting 2011 ( Taxus DES in May , 2012) -     No angina    2. Diabetes Mellitus  -   3.  Orthostatic hypotension Currently on Jardiance.  He is having problems with orthostatic hypotension which is a known side effect of Jardiance.  3. Essential Hypertension  -     Blood pressure reading overall is well controlled.   Mertie Moores, MD  07/28/2019 8:57 PM    [address],  [place], [place]  [address] Pager [phone] Phone: [phone]; Fax: [phone]

## 2019-07-29 ENCOUNTER — Other Ambulatory Visit: Payer: Self-pay

## 2019-07-29 ENCOUNTER — Ambulatory Visit (INDEPENDENT_AMBULATORY_CARE_PROVIDER_SITE_OTHER): Payer: Medicare Other | Admitting: Cardiovascular Disease

## 2019-07-29 ENCOUNTER — Encounter: Payer: Self-pay | Admitting: Cardiovascular Disease

## 2019-07-29 VITALS — BP 130/68 | HR 77 | Ht 71.5 in | Wt 238.5 lb

## 2019-07-29 DIAGNOSIS — I951 Orthostatic hypotension: Secondary | ICD-10-CM | POA: Diagnosis not present

## 2019-07-29 DIAGNOSIS — I10 Essential (primary) hypertension: Secondary | ICD-10-CM

## 2019-07-29 DIAGNOSIS — H43813 Vitreous degeneration, bilateral: Secondary | ICD-10-CM | POA: Diagnosis not present

## 2019-07-29 DIAGNOSIS — E113313 Type 2 diabetes mellitus with moderate nonproliferative diabetic retinopathy with macular edema, bilateral: Secondary | ICD-10-CM | POA: Diagnosis not present

## 2019-07-29 DIAGNOSIS — I2510 Atherosclerotic heart disease of native coronary artery without angina pectoris: Secondary | ICD-10-CM | POA: Diagnosis not present

## 2019-07-29 DIAGNOSIS — H43821 Vitreomacular adhesion, right eye: Secondary | ICD-10-CM | POA: Diagnosis not present

## 2019-07-29 DIAGNOSIS — H35372 Puckering of macula, left eye: Secondary | ICD-10-CM | POA: Diagnosis not present

## 2019-07-29 NOTE — Patient Instructions (Signed)

## 2019-07-31 DIAGNOSIS — E1165 Type 2 diabetes mellitus with hyperglycemia: Secondary | ICD-10-CM | POA: Diagnosis not present

## 2019-08-05 ENCOUNTER — Other Ambulatory Visit: Payer: Self-pay

## 2019-08-05 NOTE — Telephone Encounter (Signed)
Left a message earlier for patient to call back.

## 2019-08-06 ENCOUNTER — Other Ambulatory Visit: Payer: Self-pay

## 2019-08-06 DIAGNOSIS — R55 Syncope and collapse: Secondary | ICD-10-CM

## 2019-08-06 NOTE — Telephone Encounter (Signed)
Spoke with the patient earlier regarding results and recommendations. He is aware that EP scheduler will contact him for an appt.

## 2019-08-06 NOTE — Telephone Encounter (Signed)
New Message ° ° °Patient returning your call. °

## 2019-08-18 DIAGNOSIS — E113291 Type 2 diabetes mellitus with mild nonproliferative diabetic retinopathy without macular edema, right eye: Secondary | ICD-10-CM | POA: Diagnosis not present

## 2019-08-18 DIAGNOSIS — Z961 Presence of intraocular lens: Secondary | ICD-10-CM | POA: Diagnosis not present

## 2019-08-21 ENCOUNTER — Encounter

## 2019-08-21 ENCOUNTER — Ambulatory Visit: Payer: Medicare Other | Admitting: Internal Medicine

## 2019-08-21 ENCOUNTER — Encounter: Payer: Self-pay | Admitting: Internal Medicine

## 2019-08-21 ENCOUNTER — Other Ambulatory Visit: Payer: Self-pay

## 2019-08-21 VITALS — BP 132/66 | HR 89 | Ht 71.5 in | Wt 252.0 lb

## 2019-08-21 DIAGNOSIS — R55 Syncope and collapse: Secondary | ICD-10-CM

## 2019-08-21 NOTE — Patient Instructions (Addendum)
Medication Instructions:  Your physician has recommended you make the following change in your medication:   1.  Stop taking TOPROL XL (metoprolol succinate)  Labwork: None ordered.  Testing/Procedures: None ordered.  Follow-Up: Your physician wants you to follow-up in: as needed with Dr. Lovena Le.     Any Other Special Instructions Will Be Listed Below (If Applicable).  If you need a refill on your cardiac medications before your next appointment, please call your pharmacy.

## 2019-08-21 NOTE — Progress Notes (Signed)
HPI Jason Giles is referred today by Dr. Acie Fredrickson for evaluation of syncope. He is a pleasant 73 yo man with CAD, s/p stenting who had a sudden syncopal episode who was found on cardiac monitoring to have transient AV block. He has been on metoprolol. When he wore this cardiac monitor, he had daytime pauses of 4 seconds for which he was asymptomatic.  No Known Allergies   Current Outpatient Medications  Medication Sig Dispense Refill  . acetaminophen (TYLENOL) 325 MG tablet Take 325 mg by mouth every 6 (six) hours as needed for mild pain or headache.    Marland Kitchen aspirin 81 MG tablet Take 81 mg by mouth daily.      Marland Kitchen COLCRYS 0.6 MG tablet Take 0.6 mg by mouth See admin instructions. Take 0.6 mg by mouth once a day for 2-3 days as directed for gout flares    . fenofibrate micronized (LOFIBRA) 134 MG capsule Take 134 mg by mouth daily before breakfast.     . insulin aspart (NOVOLOG) 100 UNIT/ML injection Inject into the skin continuous. Per pump    . Insulin Human (INSULIN PUMP) SOLN Inject into the skin continuous. Using Novolog    . JARDIANCE 25 MG TABS tablet Take 25 mg by mouth daily.   3  . metFORMIN (GLUCOPHAGE) 850 MG tablet Take 850 mg by mouth 2 (two) times daily with a meal.     . metoprolol succinate (TOPROL-XL) 25 MG 24 hr tablet Take 25 mg by mouth daily.     . multivitamin (ONE-A-DAY MEN'S) TABS tablet Take 1 tablet by mouth daily.    . nitroGLYCERIN (NITROSTAT) 0.4 MG SL tablet Place 1 tablet (0.4 mg total) under the tongue every 5 (five) minutes as needed for chest pain. 25 tablet 0  . PLAVIX 75 MG tablet Take 75 mg by mouth Daily.     . rosuvastatin (CRESTOR) 40 MG tablet Take 40 mg by mouth daily.     No current facility-administered medications for this visit.      Past Medical History:  Diagnosis Date  . Arthritis   . Colon polyp    adenomatous  . Coronary artery disease    post PTCA and stenting  . CORONARY ATHEROSCLEROSIS NATIVE CORONARY ARTERY 12/21/2009  .  Diabetes mellitus   . Diverticulosis   . Hyperlipidemia   . Hypertension   . Kidney stones     ROS:   All systems reviewed and negative except as noted in the HPI.   Past Surgical History:  Procedure Laterality Date  . CORONARY ANGIOPLASTY WITH STENT PLACEMENT     left circumflex artery     Family History  Problem Relation Age of Onset  . Cancer Father        Brain Tumor  . Colon cancer Mother   . Colon polyps Neg Hx   . Kidney disease Neg Hx   . Diabetes Neg Hx   . Esophageal cancer Neg Hx      Social History   Socioeconomic History  . Marital status: Married    Spouse name: Not on file  . Number of children: 4  . Years of education: Not on file  . Highest education level: Not on file  Occupational History  . Occupation: Retired  Scientific laboratory technician  . Financial resource strain: Not on file  . Food insecurity    Worry: Not on file    Inability: Not on file  . Transportation needs    Medical:  Not on file    Non-medical: Not on file  Tobacco Use  . Smoking status: Former Smoker    Quit date: 11/13/1985    Years since quitting: 33.7  . Smokeless tobacco: Never Used  Substance and Sexual Activity  . Alcohol use: Yes    Alcohol/week: 0.0 standard drinks    Comment: Occassionally  . Drug use: No  . Sexual activity: Not on file  Lifestyle  . Physical activity    Days per week: Not on file    Minutes per session: Not on file  . Stress: Not on file  Relationships  . Social Herbalist on phone: Not on file    Gets together: Not on file    Attends religious service: Not on file    Active member of club or organization: Not on file    Attends meetings of clubs or organizations: Not on file    Relationship status: Not on file  . Intimate partner violence    Fear of current or ex partner: Not on file    Emotionally abused: Not on file    Physically abused: Not on file    Forced sexual activity: Not on file  Other Topics Concern  . Not on file   Social History Narrative  . Not on file     BP 132/66   Pulse 89   Ht 5' 11.5" (1.816 m)   Wt 252 lb (114.3 kg)   SpO2 98%   BMI 34.66 kg/m   Physical Exam:  Well appearing NAD HEENT: Unremarkable Neck:  No JVD, no thyromegally Lymphatics:  No adenopathy Back:  No CVA tenderness Lungs:  Clear with no wheezes HEART:  Regular rate rhythm, no murmurs, no rubs, no clicks Abd:  soft, positive bowel sounds, no organomegally, no rebound, no guarding Ext:  2 plus pulses, no edema, no cyanosis, no clubbing Skin:  No rashes no nodules Neuro:  CN II through XII intact, motor grossly intact  EKG -  NSR with PVC's  Assess/Plan: 1. Transient heart block - he has had syncope and asymptomatic 4 second pause. I have asked him to stop his metoprolol. If he has more syncope, then PPM would be warranted.  2. CAD - he denies anginal symptoms. Hopefully these will not come back when we stop the beta blocker. 3. Dyslipidemia - He will continue his statin therapy.  Mikle Bosworth.D.

## 2019-09-15 DIAGNOSIS — E7800 Pure hypercholesterolemia, unspecified: Secondary | ICD-10-CM | POA: Diagnosis not present

## 2019-09-15 DIAGNOSIS — Z125 Encounter for screening for malignant neoplasm of prostate: Secondary | ICD-10-CM | POA: Diagnosis not present

## 2019-09-15 DIAGNOSIS — E11319 Type 2 diabetes mellitus with unspecified diabetic retinopathy without macular edema: Secondary | ICD-10-CM | POA: Diagnosis not present

## 2019-09-17 DIAGNOSIS — R82998 Other abnormal findings in urine: Secondary | ICD-10-CM | POA: Diagnosis not present

## 2019-09-17 DIAGNOSIS — E11319 Type 2 diabetes mellitus with unspecified diabetic retinopathy without macular edema: Secondary | ICD-10-CM | POA: Diagnosis not present

## 2019-09-22 DIAGNOSIS — E118 Type 2 diabetes mellitus with unspecified complications: Secondary | ICD-10-CM | POA: Diagnosis not present

## 2019-09-22 DIAGNOSIS — E1165 Type 2 diabetes mellitus with hyperglycemia: Secondary | ICD-10-CM | POA: Diagnosis not present

## 2019-09-22 DIAGNOSIS — E11319 Type 2 diabetes mellitus with unspecified diabetic retinopathy without macular edema: Secondary | ICD-10-CM | POA: Diagnosis not present

## 2019-09-22 DIAGNOSIS — Z0000 Encounter for general adult medical examination without abnormal findings: Secondary | ICD-10-CM | POA: Diagnosis not present

## 2019-09-23 DIAGNOSIS — L821 Other seborrheic keratosis: Secondary | ICD-10-CM | POA: Diagnosis not present

## 2019-09-23 DIAGNOSIS — D2262 Melanocytic nevi of left upper limb, including shoulder: Secondary | ICD-10-CM | POA: Diagnosis not present

## 2019-09-23 DIAGNOSIS — Z85828 Personal history of other malignant neoplasm of skin: Secondary | ICD-10-CM | POA: Diagnosis not present

## 2019-09-23 DIAGNOSIS — D225 Melanocytic nevi of trunk: Secondary | ICD-10-CM | POA: Diagnosis not present

## 2019-09-24 DIAGNOSIS — H26492 Other secondary cataract, left eye: Secondary | ICD-10-CM | POA: Diagnosis not present

## 2019-09-24 DIAGNOSIS — H35372 Puckering of macula, left eye: Secondary | ICD-10-CM | POA: Diagnosis not present

## 2019-09-24 DIAGNOSIS — H43821 Vitreomacular adhesion, right eye: Secondary | ICD-10-CM | POA: Diagnosis not present

## 2019-09-24 DIAGNOSIS — E113313 Type 2 diabetes mellitus with moderate nonproliferative diabetic retinopathy with macular edema, bilateral: Secondary | ICD-10-CM | POA: Diagnosis not present

## 2019-09-30 ENCOUNTER — Other Ambulatory Visit: Payer: Self-pay

## 2019-09-30 DIAGNOSIS — Z20822 Contact with and (suspected) exposure to covid-19: Secondary | ICD-10-CM

## 2019-10-01 ENCOUNTER — Telehealth: Payer: Self-pay | Admitting: *Deleted

## 2019-10-01 NOTE — Telephone Encounter (Signed)
   [facility] Pre-operative Risk Assessment    Request for surgical clearance:  1. What type of surgery is being performed? VITRECTOMY, MEMBRANE PEEL, INTRAVITREAL LUCENTIS 0.3 and REMOVAL OF POSTERIOR CAPSULAR OPACIFICATION IN THE LEFT EYE   2. When is this surgery scheduled? 10/23/19   3. What type of clearance is required (medical clearance vs. Pharmacy clearance to hold med vs. Both)? MEDICAL  4. Are there any medications that need to be held prior to surgery and how long? PLAVIX X 5 DAYS AND ASA X 7 DAYS    5. Practice name and name of physician performing surgery? [facility]; DR. Corene Cornea SANDERS   6. What is your office phone number [phone]    7.   What is your office fax number [phone]  8.   Anesthesia type (None, local, MAC, general) ? MAC   Jason Giles 10/01/2019, 9:03 AM  _________________________________________________________________   (provider comments below)

## 2019-10-01 NOTE — Telephone Encounter (Signed)
OK to hold ASA and plavix for 5-7 days prior to eye procedure

## 2019-10-01 NOTE — Telephone Encounter (Signed)
   Primary Cardiologist: Mertie Moores, MD  Chart reviewed as part of pre-operative protocol coverage. Patient has a history of CAD s/p PTCA/stenting of OM in 2006, HTN, HLD, DM. Admittd in 06/2019 for syncope. Felt to be due to orthostatic hypotension. Echo during this admission showed LVEF of >65% with no significant valvular disease. He last Dr. Acie Fredrickson on 07/29/2019 and still reported symptoms consistent with orthostasis. Outpatient monitor showed daytime pause of 4 seconds (asymptomatic with this) so he was referred to EP. He saw Dr. Lovena Le on 08/21/2019 who recommended stopping Toprol. I called and spoke with patient today. He has been doing well since he last saw Dr. Lovena Le. No additional syncope episodes. No lightheadedness, dizziness, chest pain, or shortness of breath. Given past medical history and time since last visit, based on ACC/AHA guidelines, Jason Giles would be at acceptable risk for the planned procedure without further cardiovascular testing.   Given recent 4 second pause, would recommend monitoring on telemetry perioperatively.   Per Dr. Acie Fredrickson: "OK to hold ASA and Plavix for 5-7 days prior to eye procedure." Would restart as soon as able following surgery.  I will route this recommendation to the requesting party via Epic fax function and remove from pre-op pool.  Please call with questions.  Darreld Mclean, PA-C 10/01/2019, 10:49 AM

## 2019-10-01 NOTE — Telephone Encounter (Signed)
Hi Dr. Acie Fredrickson,  Can you please comment on holding Plavix and Aspirin for upcoming left eye procedure on 10/23/2019.   He has a history of CAD s/p PTCA/stenting of OM in 2006, HTN, HLD, DM. Admittd in 06/2019 for syncope. Felt to be due to orthostatic hypotension. Echo showed LVEF of >65% with no significant valvular disease. He last saw you on 07/29/2019 and still reported symptoms consistent with orthostasis. Outpatient monitor showed daytime pause of 4 seconds (asymptomatic with this) so he was referred to EP. He saw Dr. Lovena Le on 08/21/2019 who recommended stopping Toprol.   Please route response back to pre-op pool.   Thanks so much! Callie

## 2019-10-02 LAB — NOVEL CORONAVIRUS, NAA: SARS-CoV-2, NAA: NOT DETECTED

## 2019-10-20 DIAGNOSIS — Z794 Long term (current) use of insulin: Secondary | ICD-10-CM | POA: Diagnosis not present

## 2019-10-20 DIAGNOSIS — E119 Type 2 diabetes mellitus without complications: Secondary | ICD-10-CM | POA: Diagnosis not present

## 2019-10-23 DIAGNOSIS — H26492 Other secondary cataract, left eye: Secondary | ICD-10-CM | POA: Diagnosis not present

## 2019-10-23 DIAGNOSIS — H3581 Retinal edema: Secondary | ICD-10-CM | POA: Diagnosis not present

## 2019-10-23 DIAGNOSIS — H35372 Puckering of macula, left eye: Secondary | ICD-10-CM | POA: Diagnosis not present

## 2019-10-23 DIAGNOSIS — E1139 Type 2 diabetes mellitus with other diabetic ophthalmic complication: Secondary | ICD-10-CM | POA: Diagnosis not present

## 2019-10-23 DIAGNOSIS — E113313 Type 2 diabetes mellitus with moderate nonproliferative diabetic retinopathy with macular edema, bilateral: Secondary | ICD-10-CM | POA: Diagnosis not present

## 2019-10-24 DIAGNOSIS — H35372 Puckering of macula, left eye: Secondary | ICD-10-CM | POA: Diagnosis not present

## 2019-10-31 DIAGNOSIS — H35372 Puckering of macula, left eye: Secondary | ICD-10-CM | POA: Diagnosis not present

## 2019-10-31 DIAGNOSIS — E113313 Type 2 diabetes mellitus with moderate nonproliferative diabetic retinopathy with macular edema, bilateral: Secondary | ICD-10-CM | POA: Diagnosis not present

## 2019-11-21 DIAGNOSIS — E113313 Type 2 diabetes mellitus with moderate nonproliferative diabetic retinopathy with macular edema, bilateral: Secondary | ICD-10-CM | POA: Diagnosis not present

## 2019-11-21 DIAGNOSIS — H35372 Puckering of macula, left eye: Secondary | ICD-10-CM | POA: Diagnosis not present

## 2019-12-04 ENCOUNTER — Ambulatory Visit: Payer: Medicare Other | Attending: Internal Medicine

## 2019-12-04 DIAGNOSIS — Z23 Encounter for immunization: Secondary | ICD-10-CM | POA: Insufficient documentation

## 2019-12-04 NOTE — Progress Notes (Signed)
   Covid-19 Vaccination Clinic  Name:  Jason Giles    MRN: [id number] DOB: 1945-11-19  12/04/2019  [name] was observed post Covid-19 immunization for 30 minutes based on pre-vaccination screening without incidence. He was provided with Vaccine Information Sheet and instruction to access the V-Safe system.   [name] was instructed to call 911 with any severe reactions post vaccine: Marland Kitchen Difficulty breathing  . Swelling of your face and throat  . A fast heartbeat  . A bad rash all over your body  . Dizziness and weakness    Immunizations Administered    Name Date Dose VIS Date Route   Pfizer COVID-19 Vaccine 12/04/2019 12:40 PM 0.3 mL 10/24/2019 Intramuscular   Manufacturer: [place]   Lot: =   [facility]: [id number]

## 2019-12-06 DIAGNOSIS — E1165 Type 2 diabetes mellitus with hyperglycemia: Secondary | ICD-10-CM | POA: Diagnosis not present

## 2019-12-08 DIAGNOSIS — Z1212 Encounter for screening for malignant neoplasm of rectum: Secondary | ICD-10-CM | POA: Diagnosis not present

## 2019-12-18 ENCOUNTER — Ambulatory Visit: Payer: Medicare Other

## 2019-12-19 DIAGNOSIS — E113313 Type 2 diabetes mellitus with moderate nonproliferative diabetic retinopathy with macular edema, bilateral: Secondary | ICD-10-CM | POA: Diagnosis not present

## 2019-12-19 DIAGNOSIS — H43811 Vitreous degeneration, right eye: Secondary | ICD-10-CM | POA: Diagnosis not present

## 2019-12-19 DIAGNOSIS — H43821 Vitreomacular adhesion, right eye: Secondary | ICD-10-CM | POA: Diagnosis not present

## 2019-12-22 ENCOUNTER — Ambulatory Visit: Payer: Medicare Other

## 2019-12-25 ENCOUNTER — Ambulatory Visit: Payer: Medicare Other | Attending: Internal Medicine

## 2019-12-25 DIAGNOSIS — Z23 Encounter for immunization: Secondary | ICD-10-CM | POA: Insufficient documentation

## 2019-12-25 NOTE — Progress Notes (Signed)
   Covid-19 Vaccination Clinic  Name:  Jason Giles    MRN: [id number] DOB: 08-17-1946  12/25/2019  Mr. Jason Giles was observed post Covid-19 immunization for 30 minutes based on pre-vaccination screening without incidence. He was provided with Vaccine Information Sheet and instruction to access the V-Safe system.   Mr. Jason Giles was instructed to call 911 with any severe reactions post vaccine: Marland Kitchen Difficulty breathing  . Swelling of your face and throat  . A fast heartbeat  . A bad rash all over your body  . Dizziness and weakness    Immunizations Administered    Name Date Dose VIS Date Route   Pfizer COVID-19 Vaccine 12/25/2019 12:38 PM 0.3 mL 10/24/2019 Intramuscular   Manufacturer: [place]   Lot: [id number]   [place]: [id number]

## 2020-01-17 DIAGNOSIS — Z794 Long term (current) use of insulin: Secondary | ICD-10-CM | POA: Diagnosis not present

## 2020-01-17 DIAGNOSIS — E119 Type 2 diabetes mellitus without complications: Secondary | ICD-10-CM | POA: Diagnosis not present

## 2020-01-21 DIAGNOSIS — H43821 Vitreomacular adhesion, right eye: Secondary | ICD-10-CM | POA: Diagnosis not present

## 2020-01-21 DIAGNOSIS — H35371 Puckering of macula, right eye: Secondary | ICD-10-CM | POA: Diagnosis not present

## 2020-01-21 DIAGNOSIS — E113313 Type 2 diabetes mellitus with moderate nonproliferative diabetic retinopathy with macular edema, bilateral: Secondary | ICD-10-CM | POA: Diagnosis not present

## 2020-01-21 DIAGNOSIS — H43811 Vitreous degeneration, right eye: Secondary | ICD-10-CM | POA: Diagnosis not present

## 2020-01-23 DIAGNOSIS — E1165 Type 2 diabetes mellitus with hyperglycemia: Secondary | ICD-10-CM | POA: Diagnosis not present

## 2020-02-02 ENCOUNTER — Encounter: Payer: Self-pay | Admitting: Cardiovascular Disease

## 2020-02-02 ENCOUNTER — Ambulatory Visit: Payer: Medicare Other | Admitting: Cardiovascular Disease

## 2020-02-02 ENCOUNTER — Other Ambulatory Visit: Payer: Self-pay

## 2020-02-02 VITALS — BP 142/66 | HR 88 | Ht 71.0 in | Wt 234.0 lb

## 2020-02-02 DIAGNOSIS — I2510 Atherosclerotic heart disease of native coronary artery without angina pectoris: Secondary | ICD-10-CM

## 2020-02-02 DIAGNOSIS — I951 Orthostatic hypotension: Secondary | ICD-10-CM | POA: Diagnosis not present

## 2020-02-02 NOTE — Progress Notes (Signed)
Jason Giles Date of Birth  [birth year] [facility] [id number] [address]     [employer] [place], [place]  [address] [phone]  Fax  [phone]  Problem List 1. CAD - stenting 2011 ( Taxus DES in May , 2012)  2. Diabetes Mellitus  3. Essential Hypertension     Previous Notes:   Pt is doing well.  Has retired since last visit. No chest pain .  He denies any angina or dyspnea.  Dec. 8.2015:  Jason Giles is a 74 yo who I follow for CAD, HTN, hyperlipidemia.  He went to the old office today. No having any problems. Is scheduled to have a colonoscopy and cataract surgery.     Oct. 4, 2016:  Jason Giles is doing well.   No CP or dyspnea.   Has been having some left arm discomfort.   Noticed it when he got up from bed to use the bathroom.   No pain .   Possibly overworked it the day before .  Had done some heavy lifting the day of this discomfort. Has felt fine since.  Has been doing this normal activities since   Oct. 31, 2017:   Jason Giles is seen back for follow up of his coronary artery disease.  He has a history of essential hypertension and diabetes.  We discussed [name].   His brother was the tennis coach up in [place] and he knows [place] .   No CP or dyspnea. Colman Cater to the dermatology - Basal cell CA on his nose Gets eye injections every 4 weeks  ( Macular degeneration )   Dec. 6, 2018:  Jason Giles is seen today for a follow-up visit.  He has a history of coronary artery disease-status post stenting  Still having issues with his eyes  No CP , no dyspnea.    Dec. 9, 2019:  Doing well .    Hx of PCI  - no angina  Needs to work on [employer]. Today is 234 lbs.   Labs from [facility]'s office  Chol = 101 HDL = 31 LDL = 44 Trigs = 128.   Sept. 15, 2020  Jason Giles is seen today for further evaluation of his CAD and HTN  He was in the hospital following an episode of syncope in August.  Clinically I thought that his symptoms were consistent with  orthostatic hypotension.  He had not eaten or had much to drink earlier that day.  We have encouraged him to drink more fluids. We have ordered a cardiac event monitor.  He has still had some lightheadedness.  Gets dizzy when he gets up in the am .   February 02, 2020: Jason Giles is seen today for follow-up of his coronary artery disease and hypertension. He had a presyncopal episode.  It is not clear whether this was due to volume depletion or a diabetic issue.  We stopped his metoprolol and he has felt better. Exercising some ,  Walks 2 - 3 times a week .  No CP  Still eating salt  Trying out a new insulin pump  Labs from his primary medical doctor reveal a total cholesterol of 110.  His HDL is 36.  LDL is 54.  Triglyceride levels 101.  Hemoglobin A1c is 7.4.  Hemoglobin is 17.5.   Current Outpatient Medications on File Prior to Visit  Medication Sig Dispense Refill  . acetaminophen (TYLENOL) 325 MG tablet Take 325 mg by mouth every 6 (six) hours as needed  for mild pain or headache.    Marland Kitchen aspirin 81 MG tablet Take 81 mg by mouth daily.      Marland Kitchen COLCRYS 0.6 MG tablet Take 0.6 mg by mouth See admin instructions. Take 0.6 mg by mouth once a day for 2-3 days as directed for gout flares    . fenofibrate micronized (LOFIBRA) 134 MG capsule Take 134 mg by mouth daily before breakfast.     . insulin aspart (NOVOLOG) 100 UNIT/ML injection Inject into the skin continuous. Per pump    . Insulin Human (INSULIN PUMP) SOLN Inject into the skin continuous. Using Novolog    . JARDIANCE 25 MG TABS tablet Take 25 mg by mouth daily.   3  . metFORMIN (GLUCOPHAGE) 850 MG tablet Take 850 mg by mouth 2 (two) times daily with a meal.     . multivitamin (ONE-A-DAY MEN'S) TABS tablet Take 1 tablet by mouth daily.    . nitroGLYCERIN (NITROSTAT) 0.4 MG SL tablet Place 1 tablet (0.4 mg total) under the tongue every 5 (five) minutes as needed for chest pain. 25 tablet 0  . PLAVIX 75 MG tablet Take 75 mg by mouth Daily.     .  rosuvastatin (CRESTOR) 40 MG tablet Take 40 mg by mouth daily.     No current facility-administered medications on file prior to visit.    No Known Allergies  Past Medical History:  Diagnosis Date  . Arthritis   . Colon polyp    adenomatous  . Coronary artery disease    post PTCA and stenting  . CORONARY ATHEROSCLEROSIS NATIVE CORONARY ARTERY 12/21/2009  . Diabetes mellitus   . Diverticulosis   . Hyperlipidemia   . Hypertension   . Kidney stones     Past Surgical History:  Procedure Laterality Date  . CORONARY ANGIOPLASTY WITH STENT PLACEMENT     left circumflex artery    Social History   Tobacco Use  Smoking Status Former Smoker  . Quit date: 11/13/1985  . Years since quitting: 34.2  Smokeless Tobacco Never Used    Social History   Substance and Sexual Activity  Alcohol Use Yes  . Alcohol/week: 0.0 standard drinks   Comment: Occassionally    Family History  Problem Relation Age of Onset  . Cancer Father        Brain Tumor  . Colon cancer Mother   . Colon polyps Neg Hx   . Kidney disease Neg Hx   . Diabetes Neg Hx   . Esophageal cancer Neg Hx     Reviw of Systems:  Reviewed in the HPI.  All other systems are negative.  Physical Exam: There were no vitals taken for this visit.  GEN:   Elderly male  , mildly obese HEENT: Normal NECK: No JVD; No carotid bruits LYMPHATICS: No lymphadenopathy CARDIAC: RRR   RESPIRATORY:  Clear to auscultation without rales, wheezing or rhonchi  ABDOMEN: Soft, non-tender, non-distended MUSCULOSKELETAL:  No edema; No deformity  SKIN: Warm and dry NEUROLOGIC:  Alert and oriented x 3    ECG:   Assessment / Plan:   1. CAD - stenting 2011 ( Taxus DES in May , 2012) -     Not having any angina.  Continue current medications.   2. Diabetes Mellitus  -hemoglobin A1c is 7.5.  Further management per Dr. Osborne Casco.  3.  Orthostatic hypotension: We stopped his metoprolol.  His episodes of orthostatic hypotension have  improved.  As I think he is eating a bit too  much salt at this point.  I encouraged him to restrict his salt some.  He still drinks a small V8 juice once a day when needed.    3. Essential Hypertension  -    he clearly still eating a bit more salt than he needs.  Encouraged him to work on exercise program and to decrease his salt intake.   I will have him see an APP in 6 months.  I will plan on seeing him again in 1 year.  Mertie Moores, MD  02/02/2020 8:33 AM    [place],  [place], [place]  [address] Pager [phone] Phone: [phone]; Fax: [phone]

## 2020-02-02 NOTE — Patient Instructions (Signed)
Medication Instructions:   Your physician recommends that you continue on your current medications as directed. Please refer to the Current Medication list given to you today.  *If you need a refill on your cardiac medications before your next appointment, please call your pharmacy*    Follow-Up:  1. Your physician wants you to follow-up in: 6 MONTHS WITH AN APP/EXTENDER IN OUR OFFICE  You will receive a reminder letter in the mail two months in advance. If you don't receive a letter, please call our office to schedule the follow-up appointment.   2. At [facility], you and your health needs are our priority.  As part of our continuing mission to provide you with exceptional heart care, we have created designated Provider Care Teams.  These Care Teams include your primary Cardiologist (physician) and Advanced Practice Providers (APPs -  Physician Assistants and Nurse Practitioners) who all work together to provide you with the care you need, when you need it.  We recommend signing up for the patient portal called "MyChart".  Sign up information is provided on this After Visit Summary.  MyChart is used to connect with patients for Virtual Visits (Telemedicine).  Patients are able to view lab/test results, encounter notes, upcoming appointments, etc.  Non-urgent messages can be sent to your provider as well.   To learn more about what you can do with MyChart, go to [email or web address].    Your next appointment:   12 month(s)  The format for your next appointment:   In Person  Provider:   Mertie Moores, MD

## 2020-02-03 DIAGNOSIS — Z0120 Encounter for dental examination and cleaning without abnormal findings: Secondary | ICD-10-CM | POA: Diagnosis not present

## 2020-02-25 DIAGNOSIS — H43811 Vitreous degeneration, right eye: Secondary | ICD-10-CM | POA: Diagnosis not present

## 2020-02-25 DIAGNOSIS — E113313 Type 2 diabetes mellitus with moderate nonproliferative diabetic retinopathy with macular edema, bilateral: Secondary | ICD-10-CM | POA: Diagnosis not present

## 2020-02-25 DIAGNOSIS — H35371 Puckering of macula, right eye: Secondary | ICD-10-CM | POA: Diagnosis not present

## 2020-02-25 DIAGNOSIS — H43821 Vitreomacular adhesion, right eye: Secondary | ICD-10-CM | POA: Diagnosis not present

## 2020-02-26 DIAGNOSIS — E1165 Type 2 diabetes mellitus with hyperglycemia: Secondary | ICD-10-CM | POA: Diagnosis not present

## 2020-03-24 DIAGNOSIS — E1129 Type 2 diabetes mellitus with other diabetic kidney complication: Secondary | ICD-10-CM | POA: Diagnosis not present

## 2020-03-24 DIAGNOSIS — E11319 Type 2 diabetes mellitus with unspecified diabetic retinopathy without macular edema: Secondary | ICD-10-CM | POA: Diagnosis not present

## 2020-03-24 DIAGNOSIS — E162 Hypoglycemia, unspecified: Secondary | ICD-10-CM | POA: Diagnosis not present

## 2020-03-24 DIAGNOSIS — Z4681 Encounter for fitting and adjustment of insulin pump: Secondary | ICD-10-CM | POA: Diagnosis not present

## 2020-03-31 DIAGNOSIS — E113313 Type 2 diabetes mellitus with moderate nonproliferative diabetic retinopathy with macular edema, bilateral: Secondary | ICD-10-CM | POA: Diagnosis not present

## 2020-04-02 DIAGNOSIS — E1165 Type 2 diabetes mellitus with hyperglycemia: Secondary | ICD-10-CM | POA: Diagnosis not present

## 2020-04-16 DIAGNOSIS — Z794 Long term (current) use of insulin: Secondary | ICD-10-CM | POA: Diagnosis not present

## 2020-04-16 DIAGNOSIS — E119 Type 2 diabetes mellitus without complications: Secondary | ICD-10-CM | POA: Diagnosis not present

## 2020-05-12 DIAGNOSIS — H35371 Puckering of macula, right eye: Secondary | ICD-10-CM | POA: Diagnosis not present

## 2020-05-12 DIAGNOSIS — H43821 Vitreomacular adhesion, right eye: Secondary | ICD-10-CM | POA: Diagnosis not present

## 2020-05-12 DIAGNOSIS — H43811 Vitreous degeneration, right eye: Secondary | ICD-10-CM | POA: Diagnosis not present

## 2020-05-12 DIAGNOSIS — E113313 Type 2 diabetes mellitus with moderate nonproliferative diabetic retinopathy with macular edema, bilateral: Secondary | ICD-10-CM | POA: Diagnosis not present

## 2020-06-15 DIAGNOSIS — E1165 Type 2 diabetes mellitus with hyperglycemia: Secondary | ICD-10-CM | POA: Diagnosis not present

## 2020-06-30 DIAGNOSIS — H43821 Vitreomacular adhesion, right eye: Secondary | ICD-10-CM | POA: Diagnosis not present

## 2020-06-30 DIAGNOSIS — H35371 Puckering of macula, right eye: Secondary | ICD-10-CM | POA: Diagnosis not present

## 2020-06-30 DIAGNOSIS — H43811 Vitreous degeneration, right eye: Secondary | ICD-10-CM | POA: Diagnosis not present

## 2020-06-30 DIAGNOSIS — E113313 Type 2 diabetes mellitus with moderate nonproliferative diabetic retinopathy with macular edema, bilateral: Secondary | ICD-10-CM | POA: Diagnosis not present

## 2020-07-07 ENCOUNTER — Other Ambulatory Visit: Payer: Self-pay

## 2020-07-07 ENCOUNTER — Other Ambulatory Visit: Payer: Medicare Other

## 2020-07-07 DIAGNOSIS — Z794 Long term (current) use of insulin: Secondary | ICD-10-CM | POA: Diagnosis not present

## 2020-07-07 DIAGNOSIS — Z20822 Contact with and (suspected) exposure to covid-19: Secondary | ICD-10-CM

## 2020-07-07 DIAGNOSIS — E119 Type 2 diabetes mellitus without complications: Secondary | ICD-10-CM | POA: Diagnosis not present

## 2020-07-08 LAB — SARS-COV-2, NAA 2 DAY TAT

## 2020-07-08 LAB — NOVEL CORONAVIRUS, NAA: SARS-CoV-2, NAA: NOT DETECTED

## 2020-07-09 DIAGNOSIS — Z794 Long term (current) use of insulin: Secondary | ICD-10-CM | POA: Diagnosis not present

## 2020-07-09 DIAGNOSIS — E119 Type 2 diabetes mellitus without complications: Secondary | ICD-10-CM | POA: Diagnosis not present

## 2020-07-20 DIAGNOSIS — E1165 Type 2 diabetes mellitus with hyperglycemia: Secondary | ICD-10-CM | POA: Diagnosis not present

## 2020-07-21 ENCOUNTER — Other Ambulatory Visit: Payer: Self-pay

## 2020-07-21 ENCOUNTER — Other Ambulatory Visit: Payer: Medicare Other

## 2020-07-21 DIAGNOSIS — Z20822 Contact with and (suspected) exposure to covid-19: Secondary | ICD-10-CM

## 2020-07-23 LAB — SARS-COV-2, NAA 2 DAY TAT

## 2020-07-23 LAB — NOVEL CORONAVIRUS, NAA: SARS-CoV-2, NAA: NOT DETECTED

## 2020-08-08 NOTE — Progress Notes (Signed)
Cardiology Office Note:    Date:  08/09/2020   ID:  Jason Giles, DOB [birth year], MRN [id number]  PCP:  Haywood Pao, MD  [facility] Cardiologist:  Mertie Moores, MD   [facility] Electrophysiologist:  None   Referring MD: Haywood Pao, MD   Chief Complaint:  Follow-up (CAD)    Patient Profile:    Jason Giles is a 74 y.o. male with:   Coronary artery disease   S/p Taxus DES to LCx in 06/2005  Diabetes mellitus   Hypertension   Hyperlipidemia   Hx of syncope   Echocardiogram 07/2019: EF > 65  Transient HB on event monitor >> referred to EP  beta-blocker DCd >> PPM indicated if further syncope off beta-blocker   Orthostatic hypotension   Prior CV studies: Event monitor 07/2019 Normal sinus rhythm, sinus brady, 1 episode of CHB x 4 seconds  Echocardiogram 06/14/2019 EF >65, mild LVH, normal RVSF  PCI 06/23/05 2.5 x 16 mm Taxus DES to LCx  Cardiac catheterization 06/19/05 LM irregs LAD prox 30; D1 60-70 RI mild irregs LCx 90 RCA irregs; PDA irregs EF 50-55  History of Present Illness:    Jason Giles was last seen by Dr. Acie Fredrickson in 3/21.  He returns for follow up.  He is here alone.  Since last seen, he has been doing well.  He has not had chest discomfort, significant shortness of breath, syncope, orthopnea, leg swelling.  He walks twice a week with his grandson at the park.      Past Medical History:  Diagnosis Date  . Arthritis   . Colon polyp    adenomatous  . Coronary artery disease    post PTCA and stenting  . CORONARY ATHEROSCLEROSIS NATIVE CORONARY ARTERY 12/21/2009  . Diabetes mellitus   . Diverticulosis   . Hyperlipidemia   . Hypertension   . Kidney stones     Current Medications: Current Meds  Medication Sig  . aspirin 81 MG tablet Take 81 mg by mouth daily.    Marland Kitchen COLCRYS 0.6 MG tablet Take 0.6 mg by mouth See admin instructions. Take 0.6 mg by mouth once a day for 2-3 days as directed for gout flares  . fenofibrate  micronized (LOFIBRA) 134 MG capsule Take 134 mg by mouth daily before breakfast.   . insulin aspart (NOVOLOG) 100 UNIT/ML injection Inject 150 Units into the skin continuous. Per pump  . Insulin Human (INSULIN PUMP) SOLN Inject into the skin continuous. Using Novolog  . JARDIANCE 25 MG TABS tablet Take 25 mg by mouth daily.   . metFORMIN (GLUCOPHAGE) 850 MG tablet Take 850 mg by mouth daily with breakfast.   . multivitamin (ONE-A-DAY MEN'S) TABS tablet Take 1 tablet by mouth daily.  . nitroGLYCERIN (NITROSTAT) 0.4 MG SL tablet Place 1 tablet (0.4 mg total) under the tongue every 5 (five) minutes as needed for chest pain.  Marland Kitchen PLAVIX 75 MG tablet Take 75 mg by mouth Daily.   . rosuvastatin (CRESTOR) 40 MG tablet Take 40 mg by mouth daily.     Allergies:   Patient has no known allergies.   Social History   Tobacco Use  . Smoking status: Former Smoker    Quit date: 11/13/1985    Years since quitting: 34.7  . Smokeless tobacco: Never Used  Vaping Use  . Vaping Use: Never used  Substance Use Topics  . Alcohol use: Yes    Alcohol/week: 0.0 standard drinks    Comment: Occassionally  .  Drug use: No     Family Hx: The patient's family history includes Cancer in his father; Colon cancer in his mother. There is no history of Colon polyps, Kidney disease, Diabetes, or Esophageal cancer.  ROS   EKGs/Labs/Other Test Reviewed:    EKG:  EKG is   ordered today.  The ekg ordered today demonstrates normal sinus rhythm, heart rate 85, normal axis, nonspecific ST-T wave changes, QTC 437  Recent Labs: No results found for requested labs within last 8760 hours.   Recent Lipid Panel No results found for: CHOL, TRIG, HDL, CHOLHDL, LDLCALC, LDLDIRECT   Labs from PCP personally reviewed and interpreted ([facility]) 09/15/2019: Total cholesterol 110, triglycerides 101, HDL 36, LDL 54, TSH 124 03/24/2020: Hemoglobin A1c 7, hemoglobin 18.1, creatinine 1.3, ALT 24  Physical Exam:    VS:  BP 130/78    Pulse 85   Ht 5' 11.5" (1.816 m)   Wt 231 lb 12.8 oz (105.1 kg)   SpO2 96%   BMI 31.88 kg/m     Wt Readings from Last 3 Encounters:  08/09/20 231 lb 12.8 oz (105.1 kg)  02/02/20 234 lb (106.1 kg)  08/21/19 252 lb (114.3 kg)     Constitutional:      Appearance: Healthy appearance. Not in distress.  Neck:     Vascular: JVD normal.  Pulmonary:     Effort: Pulmonary effort is normal.     Breath sounds: No wheezing. No rales.  Cardiovascular:     Normal rate. Regular rhythm. Normal S1. Normal S2.     Murmurs: There is no murmur.  Edema:    Peripheral edema absent.  Abdominal:     Palpations: Abdomen is soft. There is no hepatomegaly.  Skin:    General: Skin is warm and dry.  Neurological:     General: No focal deficit present.     Mental Status: Alert and oriented to person, place and time.     Cranial Nerves: Cranial nerves are intact.       ASSESSMENT & PLAN:    1. Coronary artery disease involving native coronary artery of native heart without angina pectoris History of Taxus DES to the LCx in August 2006.  He is doing well without anginal symptoms.  Continue aspirin, clopidogrel, rosuvastatin.  Follow-up in 6 months.  2. Syncope and collapse He had transient heart block on his monitor in 07/2019.  His beta-blocker was discontinued.  He has not had recurrent syncope since that time.  We discussed the importance of earlier follow-up if he has recurrent symptoms.  3. Essential hypertension The patient's blood pressure is controlled on no medications.   4. Mixed hyperlipidemia LDL optimal on most recent lab work.  Continue current Rx.      Dispo:  Return in about 6 months (around 02/06/2021) for Routine Follow Up, w/ Dr. Acie Fredrickson, in person.   Medication Adjustments/Labs and Tests Ordered: Current medicines are reviewed at length with the patient today.  Concerns regarding medicines are outlined above.  Tests Ordered: Orders Placed This Encounter  Procedures  . EKG  12-Lead   Medication Changes: No orders of the defined types were placed in this encounter.   Signed, Richardson Dopp, PA-C  08/09/2020 8:39 AM    [facility] [place], [place], [place]  [address] Phone: [phone]; Fax: [phone]

## 2020-08-09 ENCOUNTER — Other Ambulatory Visit: Payer: Self-pay

## 2020-08-09 ENCOUNTER — Encounter: Payer: Self-pay | Admitting: Physician Assistant

## 2020-08-09 ENCOUNTER — Ambulatory Visit: Payer: Medicare Other | Admitting: Physician Assistant

## 2020-08-09 VITALS — BP 130/78 | HR 85 | Ht 71.5 in | Wt 231.8 lb

## 2020-08-09 DIAGNOSIS — E782 Mixed hyperlipidemia: Secondary | ICD-10-CM

## 2020-08-09 DIAGNOSIS — R55 Syncope and collapse: Secondary | ICD-10-CM | POA: Diagnosis not present

## 2020-08-09 DIAGNOSIS — I10 Essential (primary) hypertension: Secondary | ICD-10-CM | POA: Diagnosis not present

## 2020-08-09 DIAGNOSIS — I2510 Atherosclerotic heart disease of native coronary artery without angina pectoris: Secondary | ICD-10-CM | POA: Diagnosis not present

## 2020-08-09 NOTE — Patient Instructions (Signed)
Medication Instructions:  Your physician recommends that you continue on your current medications as directed. Please refer to the Current Medication list given to you today.  *If you need a refill on your cardiac medications before your next appointment, please call your pharmacy*  Lab Work: None ordered today  If you have labs (blood work) drawn today and your tests are completely normal, you will receive your results only by: Marland Kitchen MyChart Message (if you have MyChart) OR . A paper copy in the mail If you have any lab test that is abnormal or we need to change your treatment, we will call you to review the results.  Testing/Procedures: None ordered today  Follow-Up: On 02/04/2021 at 8:00AM with Mertie Moores, MD

## 2020-08-11 DIAGNOSIS — E113313 Type 2 diabetes mellitus with moderate nonproliferative diabetic retinopathy with macular edema, bilateral: Secondary | ICD-10-CM | POA: Diagnosis not present

## 2020-08-11 DIAGNOSIS — H35371 Puckering of macula, right eye: Secondary | ICD-10-CM | POA: Diagnosis not present

## 2020-08-11 DIAGNOSIS — H43821 Vitreomacular adhesion, right eye: Secondary | ICD-10-CM | POA: Diagnosis not present

## 2020-08-11 DIAGNOSIS — H43811 Vitreous degeneration, right eye: Secondary | ICD-10-CM | POA: Diagnosis not present

## 2020-08-12 DIAGNOSIS — Z0120 Encounter for dental examination and cleaning without abnormal findings: Secondary | ICD-10-CM | POA: Diagnosis not present

## 2020-08-18 DIAGNOSIS — Z961 Presence of intraocular lens: Secondary | ICD-10-CM | POA: Diagnosis not present

## 2020-08-18 DIAGNOSIS — E113291 Type 2 diabetes mellitus with mild nonproliferative diabetic retinopathy without macular edema, right eye: Secondary | ICD-10-CM | POA: Diagnosis not present

## 2020-08-18 DIAGNOSIS — E113312 Type 2 diabetes mellitus with moderate nonproliferative diabetic retinopathy with macular edema, left eye: Secondary | ICD-10-CM | POA: Diagnosis not present

## 2020-08-19 DIAGNOSIS — E1165 Type 2 diabetes mellitus with hyperglycemia: Secondary | ICD-10-CM | POA: Diagnosis not present

## 2020-09-13 DIAGNOSIS — H9193 Unspecified hearing loss, bilateral: Secondary | ICD-10-CM | POA: Diagnosis not present

## 2020-09-13 DIAGNOSIS — H60312 Diffuse otitis externa, left ear: Secondary | ICD-10-CM | POA: Diagnosis not present

## 2020-09-15 DIAGNOSIS — H60312 Diffuse otitis externa, left ear: Secondary | ICD-10-CM | POA: Diagnosis not present

## 2020-09-16 DIAGNOSIS — H35371 Puckering of macula, right eye: Secondary | ICD-10-CM | POA: Diagnosis not present

## 2020-09-16 DIAGNOSIS — L570 Actinic keratosis: Secondary | ICD-10-CM | POA: Diagnosis not present

## 2020-09-16 DIAGNOSIS — H43811 Vitreous degeneration, right eye: Secondary | ICD-10-CM | POA: Diagnosis not present

## 2020-09-16 DIAGNOSIS — Z85828 Personal history of other malignant neoplasm of skin: Secondary | ICD-10-CM | POA: Diagnosis not present

## 2020-09-16 DIAGNOSIS — H43821 Vitreomacular adhesion, right eye: Secondary | ICD-10-CM | POA: Diagnosis not present

## 2020-09-16 DIAGNOSIS — E113313 Type 2 diabetes mellitus with moderate nonproliferative diabetic retinopathy with macular edema, bilateral: Secondary | ICD-10-CM | POA: Diagnosis not present

## 2020-09-21 DIAGNOSIS — E1129 Type 2 diabetes mellitus with other diabetic kidney complication: Secondary | ICD-10-CM | POA: Diagnosis not present

## 2020-09-21 DIAGNOSIS — Z125 Encounter for screening for malignant neoplasm of prostate: Secondary | ICD-10-CM | POA: Diagnosis not present

## 2020-09-21 DIAGNOSIS — E7800 Pure hypercholesterolemia, unspecified: Secondary | ICD-10-CM | POA: Diagnosis not present

## 2020-09-28 ENCOUNTER — Other Ambulatory Visit: Payer: Medicare Other

## 2020-09-28 DIAGNOSIS — Z20822 Contact with and (suspected) exposure to covid-19: Secondary | ICD-10-CM

## 2020-09-29 LAB — NOVEL CORONAVIRUS, NAA: SARS-CoV-2, NAA: NOT DETECTED

## 2020-09-29 LAB — SARS-COV-2, NAA 2 DAY TAT

## 2020-09-30 DIAGNOSIS — E11319 Type 2 diabetes mellitus with unspecified diabetic retinopathy without macular edema: Secondary | ICD-10-CM | POA: Diagnosis not present

## 2020-09-30 DIAGNOSIS — I1310 Hypertensive heart and chronic kidney disease without heart failure, with stage 1 through stage 4 chronic kidney disease, or unspecified chronic kidney disease: Secondary | ICD-10-CM | POA: Diagnosis not present

## 2020-09-30 DIAGNOSIS — N1831 Chronic kidney disease, stage 3a: Secondary | ICD-10-CM | POA: Diagnosis not present

## 2020-09-30 DIAGNOSIS — E119 Type 2 diabetes mellitus without complications: Secondary | ICD-10-CM | POA: Diagnosis not present

## 2020-09-30 DIAGNOSIS — Z794 Long term (current) use of insulin: Secondary | ICD-10-CM | POA: Diagnosis not present

## 2020-09-30 DIAGNOSIS — E1129 Type 2 diabetes mellitus with other diabetic kidney complication: Secondary | ICD-10-CM | POA: Diagnosis not present

## 2020-09-30 DIAGNOSIS — Z0000 Encounter for general adult medical examination without abnormal findings: Secondary | ICD-10-CM | POA: Diagnosis not present

## 2020-09-30 DIAGNOSIS — R82998 Other abnormal findings in urine: Secondary | ICD-10-CM | POA: Diagnosis not present

## 2020-09-30 DIAGNOSIS — E1165 Type 2 diabetes mellitus with hyperglycemia: Secondary | ICD-10-CM | POA: Diagnosis not present

## 2020-10-06 DIAGNOSIS — H9193 Unspecified hearing loss, bilateral: Secondary | ICD-10-CM | POA: Diagnosis not present

## 2020-10-06 DIAGNOSIS — H60312 Diffuse otitis externa, left ear: Secondary | ICD-10-CM | POA: Diagnosis not present

## 2020-10-26 DIAGNOSIS — Z1212 Encounter for screening for malignant neoplasm of rectum: Secondary | ICD-10-CM | POA: Diagnosis not present

## 2020-10-27 DIAGNOSIS — E113313 Type 2 diabetes mellitus with moderate nonproliferative diabetic retinopathy with macular edema, bilateral: Secondary | ICD-10-CM | POA: Diagnosis not present

## 2020-10-27 DIAGNOSIS — H43821 Vitreomacular adhesion, right eye: Secondary | ICD-10-CM | POA: Diagnosis not present

## 2020-10-27 DIAGNOSIS — H35371 Puckering of macula, right eye: Secondary | ICD-10-CM | POA: Diagnosis not present

## 2020-10-27 DIAGNOSIS — H43811 Vitreous degeneration, right eye: Secondary | ICD-10-CM | POA: Diagnosis not present

## 2020-11-04 IMAGING — CT CT HEAD WITHOUT CONTRAST
4 series · 16 of 47 positions shown, 18 images · non-contrast
Comparison: None.

CLINICAL DATA: Syncopal episode.

EXAM:
CT HEAD WITHOUT CONTRAST
TECHNIQUE: Contiguous axial images were obtained from the base of the skull
through the vertex without intravenous contrast.

[Series 3: head wo · axial · 0.45mm/px · z∈[-118,+17]mm · 7 of 37 slices shown, 9 images]
[im 5/37  brain]
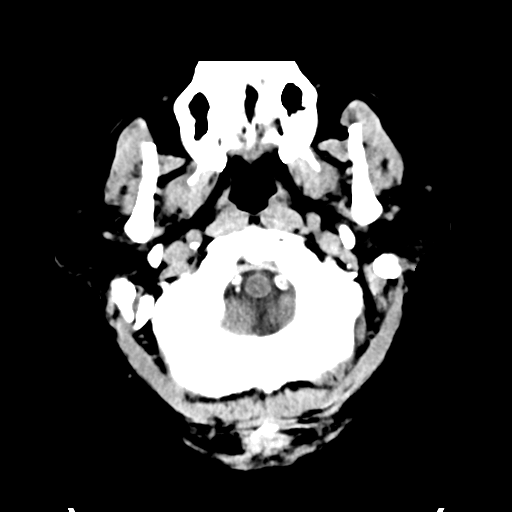
[im 5/37  bone]
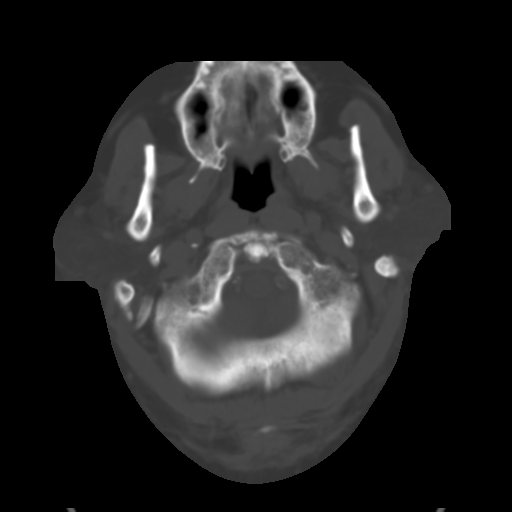
[im 10/37  brain]
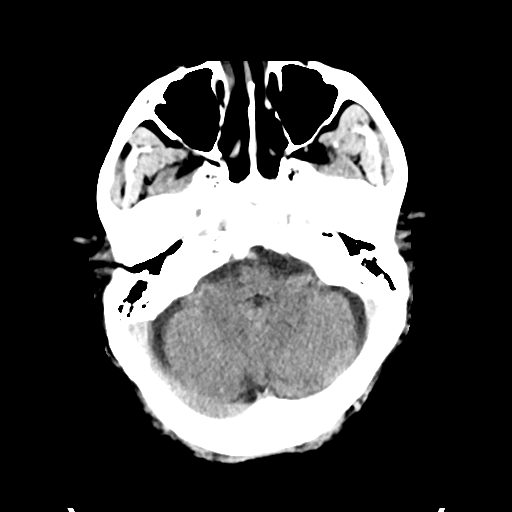
[im 14/37  brain]
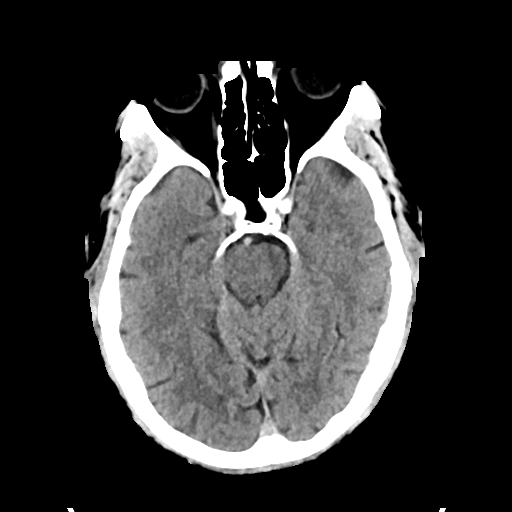
[im 19/37  brain]
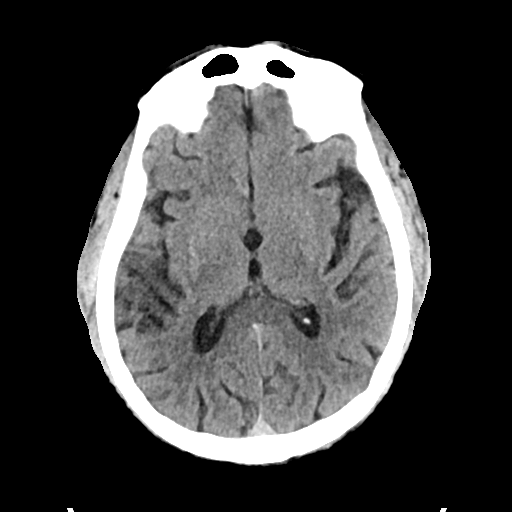
[im 23/37  brain]
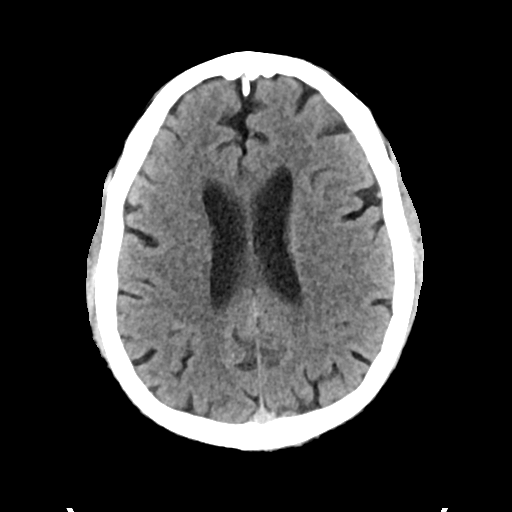
[im 23/37  bone]
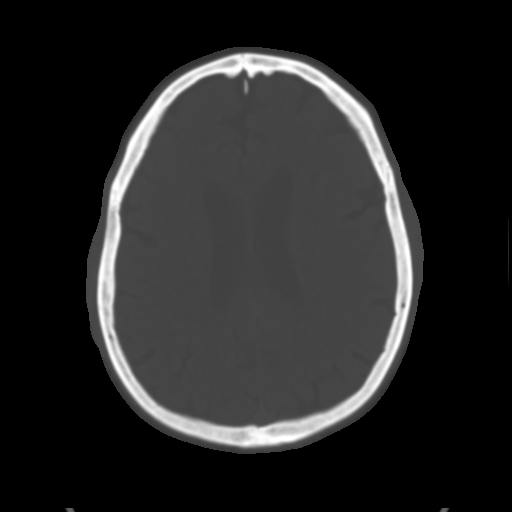
[im 28/37  brain]
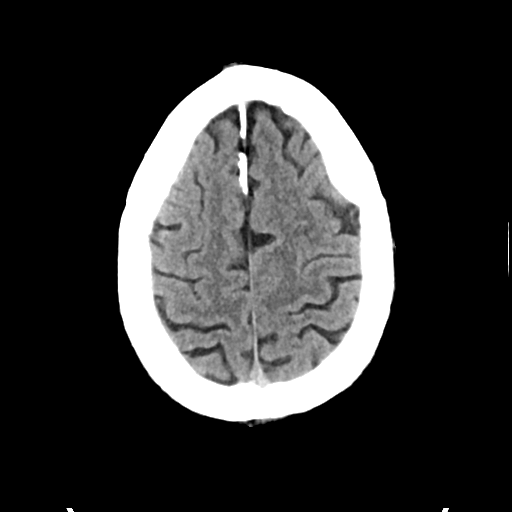
[im 32/37  brain]
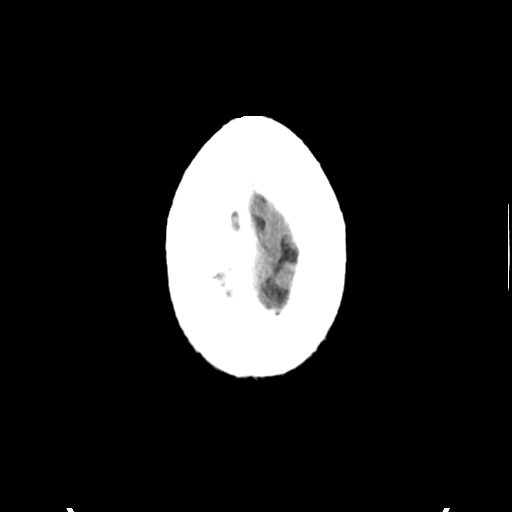

[Series 4: head bone · axial · 0.45mm/px · z∈[-120,-84]mm · 3 of 92 slices shown]
[im 10/92  bone]
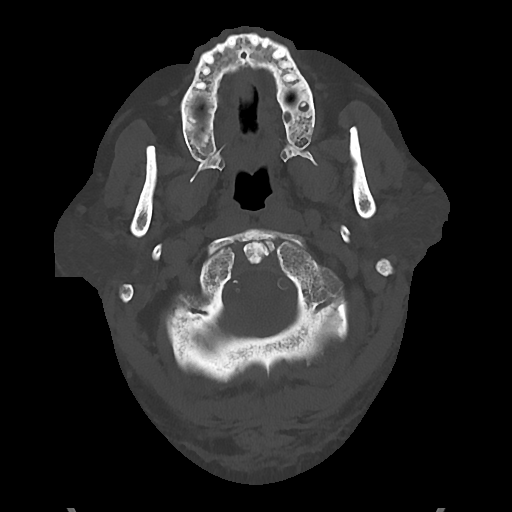
[im 19/92  bone]
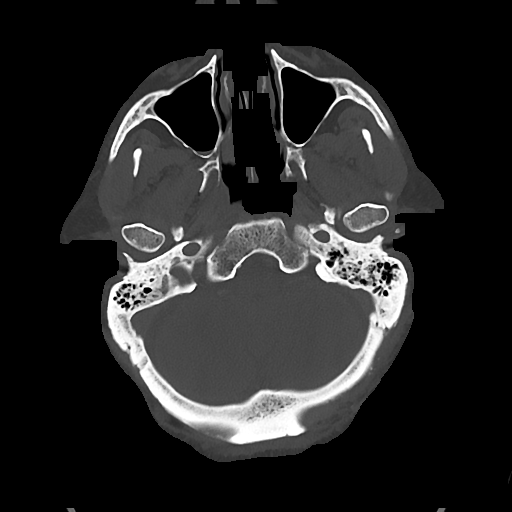
[im 28/92  bone]
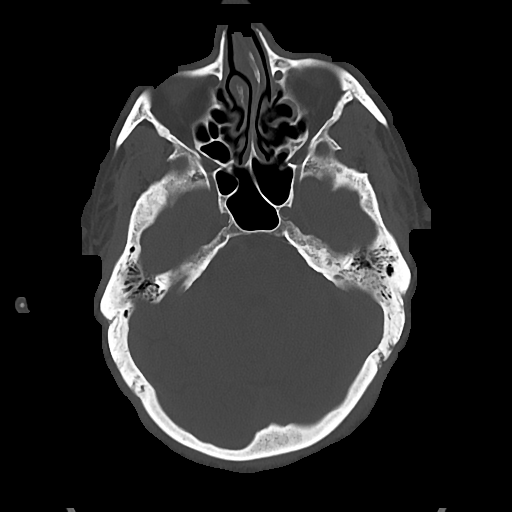

[Series 5: cor soft · coronal · 0.35mm/px · 3 of 71 slices shown]
[im 24/71  brain]
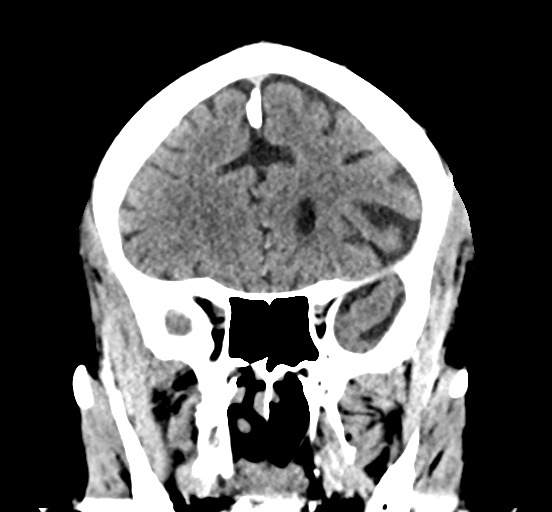
[im 32/71  brain]
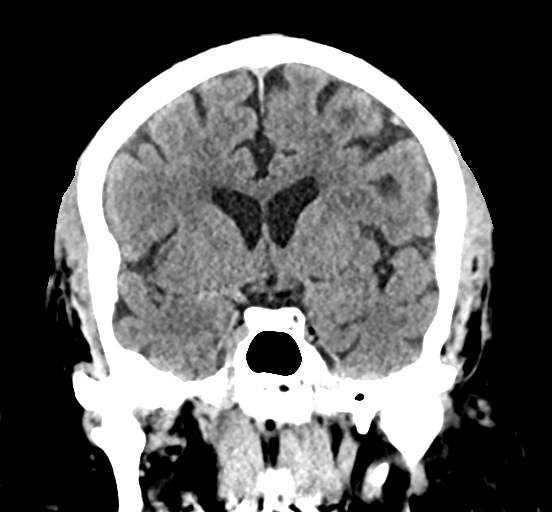
[im 39/71  brain]
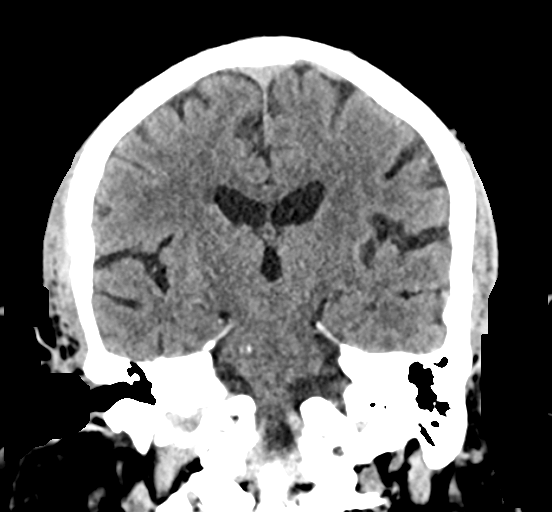

[Series 6: sag soft · sagittal · 0.34mm/px · 3 of 67 slices shown]
[im 23/67  brain]
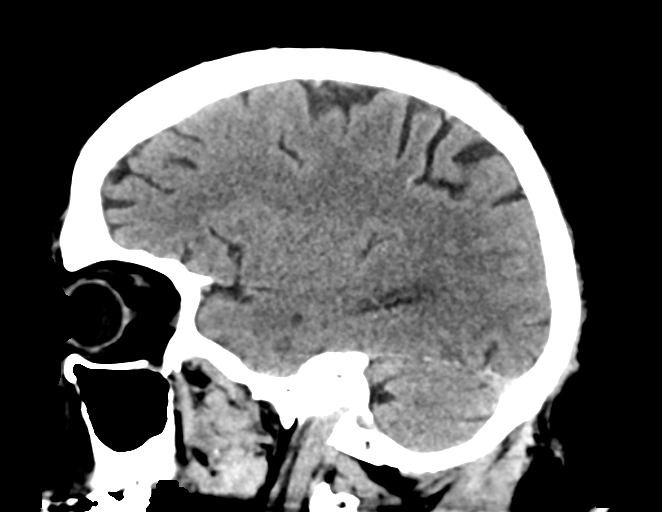
[im 34/67  brain]
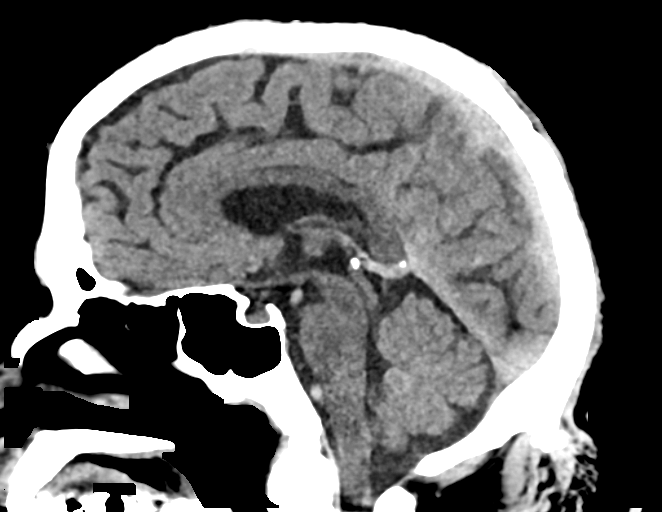
[im 45/67  brain]
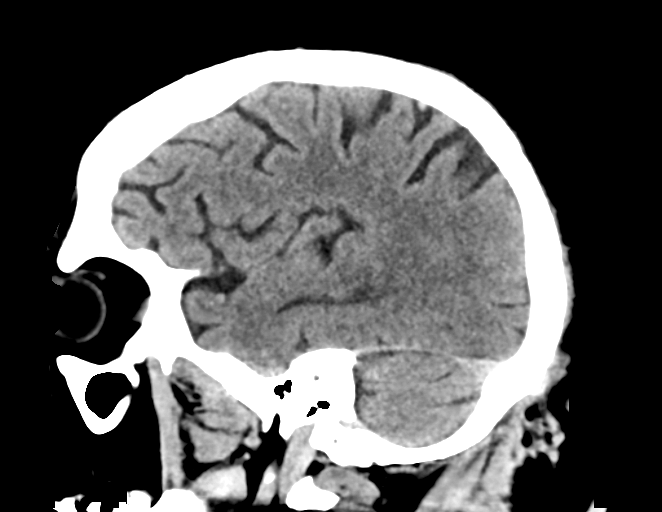

[16 of 47 positions shown; findings below may reference images not displayed]

FINDINGS: Brain: Mild age related cerebral atrophy and ventriculomegaly. No
acute intracranial findings or mass lesions. No extra-axial fluid
collections are identified. The gray-white differentiation is
maintained.

Vascular: No hyperdense vessel or unexpected calcification. Moderate
vascular calcifications.

Skull: Normal. Negative for fracture or focal lesion.

Sinuses/Orbits: The paranasal sinuses and mastoid air cells are
clear. The globes are intact.

Other: No scalp lesions or hematoma.
IMPRESSION: Age related cerebral atrophy and ventriculomegaly.

No acute intracranial findings or mass lesions.

## 2020-11-09 ENCOUNTER — Other Ambulatory Visit: Payer: Medicare Other

## 2020-11-09 DIAGNOSIS — Z20822 Contact with and (suspected) exposure to covid-19: Secondary | ICD-10-CM | POA: Diagnosis not present

## 2020-11-10 LAB — NOVEL CORONAVIRUS, NAA: SARS-CoV-2, NAA: NOT DETECTED

## 2020-11-10 LAB — SARS-COV-2, NAA 2 DAY TAT

## 2020-11-23 ENCOUNTER — Other Ambulatory Visit: Payer: Medicare Other

## 2020-11-23 DIAGNOSIS — Z20822 Contact with and (suspected) exposure to covid-19: Secondary | ICD-10-CM

## 2020-11-25 LAB — SARS-COV-2, NAA 2 DAY TAT

## 2020-11-25 LAB — NOVEL CORONAVIRUS, NAA: SARS-CoV-2, NAA: NOT DETECTED

## 2020-11-30 ENCOUNTER — Emergency Department: Payer: Medicare Other

## 2020-11-30 ENCOUNTER — Observation Stay
Admission: EM | Admit: 2020-11-30 | Discharge: 2020-12-02 | Disposition: A | Discharge status: Home / Self Care | Payer: Medicare Other | Attending: Internal Medicine | Admitting: Internal Medicine

## 2020-11-30 DIAGNOSIS — R531 Weakness: Secondary | ICD-10-CM | POA: Insufficient documentation

## 2020-11-30 DIAGNOSIS — E785 Hyperlipidemia, unspecified: Secondary | ICD-10-CM | POA: Insufficient documentation

## 2020-11-30 DIAGNOSIS — N182 Chronic kidney disease, stage 2 (mild): Secondary | ICD-10-CM | POA: Insufficient documentation

## 2020-11-30 DIAGNOSIS — Z7984 Long term (current) use of oral hypoglycemic drugs: Secondary | ICD-10-CM | POA: Insufficient documentation

## 2020-11-30 DIAGNOSIS — G9340 Encephalopathy, unspecified: Secondary | ICD-10-CM | POA: Insufficient documentation

## 2020-11-30 DIAGNOSIS — N289 Disorder of kidney and ureter, unspecified: Secondary | ICD-10-CM

## 2020-11-30 DIAGNOSIS — R509 Fever, unspecified: Secondary | ICD-10-CM

## 2020-11-30 DIAGNOSIS — I129 Hypertensive chronic kidney disease with stage 1 through stage 4 chronic kidney disease, or unspecified chronic kidney disease: Secondary | ICD-10-CM | POA: Insufficient documentation

## 2020-11-30 DIAGNOSIS — R824 Acetonuria: Secondary | ICD-10-CM | POA: Insufficient documentation

## 2020-11-30 DIAGNOSIS — R197 Diarrhea, unspecified: Secondary | ICD-10-CM

## 2020-11-30 DIAGNOSIS — I6782 Cerebral ischemia: Secondary | ICD-10-CM | POA: Insufficient documentation

## 2020-11-30 DIAGNOSIS — E872 Acidosis: Secondary | ICD-10-CM

## 2020-11-30 DIAGNOSIS — D75839 Thrombocytosis, unspecified: Secondary | ICD-10-CM | POA: Insufficient documentation

## 2020-11-30 DIAGNOSIS — Z7902 Long term (current) use of antithrombotics/antiplatelets: Secondary | ICD-10-CM | POA: Insufficient documentation

## 2020-11-30 DIAGNOSIS — R4182 Altered mental status, unspecified: Principal | ICD-10-CM | POA: Insufficient documentation

## 2020-11-30 DIAGNOSIS — Z20822 Contact with and (suspected) exposure to covid-19: Secondary | ICD-10-CM | POA: Insufficient documentation

## 2020-11-30 DIAGNOSIS — H9190 Unspecified hearing loss, unspecified ear: Secondary | ICD-10-CM | POA: Insufficient documentation

## 2020-11-30 DIAGNOSIS — Z9181 History of falling: Secondary | ICD-10-CM | POA: Insufficient documentation

## 2020-11-30 DIAGNOSIS — E1165 Type 2 diabetes mellitus with hyperglycemia: Secondary | ICD-10-CM | POA: Insufficient documentation

## 2020-11-30 DIAGNOSIS — Z794 Long term (current) use of insulin: Secondary | ICD-10-CM | POA: Insufficient documentation

## 2020-11-30 DIAGNOSIS — W19XXXA Unspecified fall, initial encounter: Secondary | ICD-10-CM

## 2020-11-30 DIAGNOSIS — E1122 Type 2 diabetes mellitus with diabetic chronic kidney disease: Secondary | ICD-10-CM | POA: Insufficient documentation

## 2020-11-30 DIAGNOSIS — Z9641 Presence of insulin pump (external) (internal): Secondary | ICD-10-CM | POA: Insufficient documentation

## 2020-11-30 DIAGNOSIS — E1142 Type 2 diabetes mellitus with diabetic polyneuropathy: Secondary | ICD-10-CM | POA: Insufficient documentation

## 2020-11-30 DIAGNOSIS — Z87891 Personal history of nicotine dependence: Secondary | ICD-10-CM | POA: Insufficient documentation

## 2020-11-30 DIAGNOSIS — N1831 Chronic kidney disease, stage 3a: Secondary | ICD-10-CM | POA: Insufficient documentation

## 2020-11-30 DIAGNOSIS — J9811 Atelectasis: Secondary | ICD-10-CM | POA: Diagnosis not present

## 2020-11-30 DIAGNOSIS — R410 Disorientation, unspecified: Secondary | ICD-10-CM | POA: Diagnosis not present

## 2020-11-30 HISTORY — DX: Hyperlipidemia, unspecified: E78.5

## 2020-11-30 HISTORY — DX: Type 2 diabetes mellitus without complications: E11.9

## 2020-11-30 LAB — URINALYSIS REFLEX TO MICROSCOPIC EXAM - REFLEX TO CULTURE
Bilirubin, UA: NEGATIVE
Blood, UA: NEGATIVE
Glucose, UA: >=500 — AB
Ketones UA: 5 — AB
Leukocyte Esterase, UA: NEGATIVE
Nitrite, UA: NEGATIVE
Protein, UR: NEGATIVE
Specific Gravity UA: 1.027 (ref 1.001–1.035)
Urine pH: 5 (ref 5.0–8.0)
Urobilinogen, UA: 2 mg/dL (ref 0.2–2.0)

## 2020-11-30 LAB — GLUCOSE WHOLE BLOOD - POCT
Whole Blood Glucose POCT: 86 mg/dL (ref 70–100)
Whole Blood Glucose POCT: 76 mg/dL (ref 70–100)
Whole Blood Glucose POCT: 229 mg/dL — ABNORMAL HIGH (ref 70–100)

## 2020-11-30 LAB — CBC AND DIFFERENTIAL
Absolute NRBC: 0 10*3/uL (ref 0.00–0.00)
Basophils Absolute Automated: 0.02 10*3/uL (ref 0.00–0.08)
Basophils Automated: 0.2 %
Eosinophils Absolute Automated: 0.02 10*3/uL (ref 0.00–0.44)
Eosinophils Automated: 0.2 %
Hematocrit: 49.1 % (ref 37.6–49.6)
Hgb: 15.7 g/dL (ref 12.5–17.1)
Immature Granulocytes Absolute: 0.04 10*3/uL (ref 0.00–0.07)
Immature Granulocytes: 0.4 %
Lymphocytes Absolute Automated: 0.39 10*3/uL — ABNORMAL LOW (ref 0.42–3.22)
Lymphocytes Automated: 4 %
MCH: 30.3 pg (ref 25.1–33.5)
MCHC: 32 g/dL (ref 31.5–35.8)
MCV: 94.6 fL (ref 78.0–96.0)
MPV: 10.3 fL (ref 8.9–12.5)
Monocytes Absolute Automated: 0.6 10*3/uL (ref 0.21–0.85)
Monocytes: 6.1 %
Neutrophils Absolute: 8.7 10*3/uL — ABNORMAL HIGH (ref 1.10–6.33)
Neutrophils: 89.1 %
Nucleated RBC: 0 /100 WBC (ref 0.0–0.0)
Platelets: 158 10*3/uL (ref 142–346)
RBC: 5.19 10*6/uL (ref 4.20–5.90)
RDW: 14 % (ref 11–15)
WBC: 9.77 10*3/uL — ABNORMAL HIGH (ref 3.10–9.50)

## 2020-11-30 LAB — COMPREHENSIVE METABOLIC PANEL
ALT: 22 U/L (ref 0–55)
AST (SGOT): 22 U/L (ref 5–34)
Albumin/Globulin Ratio: 1.5 (ref 0.9–2.2)
Albumin: 3.4 g/dL — ABNORMAL LOW (ref 3.5–5.0)
Alkaline Phosphatase: 42 U/L (ref 38–106)
Anion Gap: 14 (ref 5.0–15.0)
BUN: 25 mg/dL (ref 9.0–28.0)
Bilirubin, Total: 0.6 mg/dL (ref 0.2–1.2)
CO2: 17 mEq/L — ABNORMAL LOW (ref 22–29)
Calcium: 8.2 mg/dL (ref 7.9–10.2)
Chloride: 101 mEq/L (ref 100–111)
Creatinine: 1.4 mg/dL — ABNORMAL HIGH (ref 0.7–1.3)
Globulin: 2.3 g/dL (ref 2.0–3.6)
Glucose: 168 mg/dL — ABNORMAL HIGH (ref 70–100)
Potassium: 4.1 mEq/L (ref 3.5–5.1)
Protein, Total: 5.7 g/dL — ABNORMAL LOW (ref 6.0–8.3)
Sodium: 132 mEq/L — ABNORMAL LOW (ref 136–145)

## 2020-11-30 LAB — PT/INR
PT INR: 0.9 (ref 0.9–1.1)
PT: 11.2 s (ref 10.1–12.9)

## 2020-11-30 LAB — TROPONIN I: Troponin I: 0.02 ng/mL (ref 0.00–0.05)

## 2020-11-30 LAB — GFR: EGFR: 49.3

## 2020-11-30 MED ORDER — SODIUM CHLORIDE 0.9 % IV SOLN
INTRAVENOUS | Status: DC
Start: 2020-11-30 — End: 2020-12-01

## 2020-11-30 MED ORDER — DEXTROSE 10 % IV BOLUS
250.0000 mL | Freq: Once | INTRAVENOUS | Status: AC
Start: 2020-11-30 — End: 2020-11-30
  Administered 2020-11-30: 18:00:00 250 mL via INTRAVENOUS

## 2020-11-30 MED ORDER — SODIUM CHLORIDE 0.9 % IV BOLUS
1000.0000 mL | Freq: Once | INTRAVENOUS | Status: AC
Start: 2020-11-30 — End: 2020-11-30
  Administered 2020-11-30: 21:00:00 1000 mL via INTRAVENOUS

## 2020-11-30 NOTE — ED Notes (Signed)
[facility] EMERGENCY DEPT  ED NURSING NOTE FOR THE RECEIVING INPATIENT NURSE   ED NURSE [place]/Delonta Yohannes   [place] [id number]   ED CHARGE RN Toma Copier - [phone]   ADMISSION INFORMATION   Kent [name] is a 75 y.o. male admitted with an ED diagnosis of:    1. Fall, initial encounter    2. Generalized weakness    3. AMS (altered mental status)         Isolation: None   Allergies: Patient has no known allergies.   Holding Orders confirmed? Yes   Belongings Documented? Yes   Home medications sent to pharmacy confirmed? N/A   NURSING CARE   Patient Comes From:   Mental Status: Home/Family Care  alert and oriented   ADL: Needs assistance with ADLs   Ambulation: 1 person assist and ambulates with walker    Pertinent Information  and Safety Concerns:       CT / NIH   CT Head ordered on this patient?  Yes   NIH/Dysphagia assessment done prior to admission? N/A   VITAL SIGNS (at the time of this note)      Vitals:    11/30/20 2155   BP: 133/59   Pulse: 99   Resp: 18   Temp: 98.6 F (37 C)   SpO2: 95%

## 2020-11-30 NOTE — ED Provider Notes (Signed)
[facility] EMERGENCY DEPARTMENT H&P      Visit date: 11/30/2020      CLINICAL SUMMARY          Diagnosis:    .     Final diagnoses:   Generalized weakness   Fall, initial encounter   Confusion   AMS (altered mental status)       MDM Notes:      Fall - no head injury, confusion, global weakness and difficulty sitting up - no focal weakness to suggest CVA - low grade fever to 100 today - rule out UTI, less likely CVA, weakness not c/w TGA, rule out dehydration., transient hypoglycemia. Glucose as low as 74 and given some D10 without improvement.  NO true hypoglycmia found but could have been earlier.  Check labs, CT, u/a, hydration - feed, trend glucose.  Doubt covid at this time.      Disposition:         Observation Admit      ED Disposition     ED Disposition Condition Date/Time Comment    Observation  Tue Nov 30, 2020 11:23 PM Admitting Physician: Leeroy Bock [[id number]]   Service:: Neurology [[id number]]   Estimated Length of Stay: < 2 midnights   Tentative Discharge Plan?: Home or Self Care [1]   Does patient need telemetry?: Yes   Telemetry type (separate Telemetry order is also required):: Medical telemetry                   CLINICAL INFORMATION        HPI:      Chief Complaint: Generalized weakness  .    Kent [name] is a 75 y.o. male w/PMHx of DM and cardiac disease on Plavix and who presents with an unwitnessed fall secondary to weakness and dizziness experienced earlier today. Pt had some diarrhea this morning. Per wife, pt was confused, walked up to the wrong house, and when he walked back down the stairs, he fell. Denies hitting head. Wife noted pt was pale and did not eat much for breakfast. She checked his blood sugar which was 184 prior to meal at 10:30am and 275 after. Wife states that pt's condition only worsened throughout the day with low-grade fever, generalized weakness, and odd speech including incomplete thoughts. No slurred speech, weakness was not noticeably worse on one side.  Wife  reports that she tested COVID+ on Jan 1.  Denies chest pain, headache, or cough.  Pt is 2/2 COVID vaccinated +booster    History obtained from: patient, review of prior chart      ROS:      Positive and negative ROS elements as per HPI.  All other systems reviewed and negative.      Physical Exam:      Pulse (!) 116   BP 113/60   Resp 17   SpO2 95 %   Temp 98.8 F (37.1 C)    Physical Exam  Vitals and nursing note reviewed.   Constitutional:       General: He is not in acute distress.     Appearance: He is well-developed.   HENT:      Head: Normocephalic and atraumatic.      Mouth/Throat:      Mouth: Mucous membranes are moist.   Eyes:      Extraocular Movements: Extraocular movements intact.      Conjunctiva/sclera: Conjunctivae normal.      Pupils: Pupils are equal, round, and reactive to light.  Comments: S/p cataract surgery   Neck:      Thyroid: No thyromegaly.      Vascular: No JVD.      Trachea: No tracheal deviation.   Cardiovascular:      Rate and Rhythm: Normal rate and regular rhythm.      Heart sounds: Normal heart sounds. No murmur heard.  No friction rub. No gallop.    Pulmonary:      Effort: Pulmonary effort is normal. No respiratory distress.      Breath sounds: Normal breath sounds. No stridor. No decreased breath sounds, wheezing, rhonchi or rales.   Chest:      Chest wall: No tenderness.   Abdominal:      General: Bowel sounds are normal. There is no distension.      Palpations: Abdomen is soft. There is no mass.      Tenderness: There is no abdominal tenderness. There is no guarding or rebound.   Musculoskeletal:         General: No tenderness. Normal range of motion.      Cervical back: Normal range of motion and neck supple.      Right lower leg: No edema.      Left lower leg: No edema.   Lymphadenopathy:      Cervical: No cervical adenopathy.   Skin:     General: Skin is warm.      Coloration: Skin is not pale.      Findings: No erythema or rash.   Neurological:      Mental Status: He is  alert and oriented to person, place, and time.      Cranial Nerves: Cranial nerves are intact. No cranial nerve deficit.      Sensory: Sensation is intact.      Motor: Motor function is intact. No abnormal muscle tone.      Coordination: Coordination is intact. Coordination normal.      Comments: 5/5 strength throughout, but some difficulty sitting up for exam.  Mildly confused?  Also HOH but oriented x 3.   Psychiatric:         Speech: Speech normal.         Behavior: Behavior normal.         Thought Content: Thought content normal.         Judgment: Judgment normal.               PAST HISTORY        Primary Care Provider: No primary care provider on file.        PMH/PSH:    .     Past Medical History:   Diagnosis Date    Diabetes mellitus     Hyperlipidemia        He has no past surgical history on file.      Social/Family History:      He reports that he has quit smoking. He has never used smokeless tobacco. He reports current alcohol use. No history on file for drug use.    History reviewed. No pertinent family history.      Listed Medications on Arrival:    .     Home Medications     Med List Status: In Progress Set By: Marikay Alar, RN at 11/30/2020  7:13 PM                clopidogrel (PLAVIX) 75 mg tablet     Take 75 mg by mouth daily  empagliflozin (Jardiance) 25 MG tablet     Take 25 mg by mouth every morning     fenofibrate micronized (LOFIBRA) 134 MG capsule     Take 134 mg by mouth every morning before breakfast     metFORMIN (GLUCOPHAGE) 850 MG tablet     Take 850 mg by mouth 2 (two) times daily with meals     pseudoephedrine-acetaminophen (TYLENOL SINUS) 30-500 MG Tab     Take 1 tablet by mouth every 4 (four) hours as needed     rosuvastatin (CRESTOR) 40 MG tablet     Take 40 mg by mouth daily         Allergies: He has No Known Allergies.            VISIT INFORMATION        Clinical Course in the ED:    No significant findings on labs, CT, u/a.  Glucose stable - most recent 147.  Will plan for  admission to CNS hospitalist.  Patient has insulin pump - wife has set it at basal rate of 3.5 units per hour for now - as he is slightly confused, would not have him use tonight - can  Use sliding scale lispro if needed.  I have discussed with CNS hospitalist.  I have discussed with his wife. Given CNS hospitalist name and will call back to check on room assignment when available.         Medications Given in the ED:    .     ED Medication Orders (From admission, onward)    Start Ordered     Status Ordering Provider    11/30/20 2040 11/30/20 2039  sodium chloride 0.9 % bolus 1,000 mL  Once        Route: Intravenous  Ordered Dose: 1,000 mL     Last MAR action: New Bag Daquane Aguilar    11/30/20 1754 11/30/20 1754  dextrose (D10W) 10% bolus 250 mL  Once        Route: Intravenous  Ordered Dose: 250 mL     Last MAR action: Stopped EDDY, MARY M          Procedures:      Procedures      Interpretations:      EKG -             interpreted by me: sinus rhythm rate 97, no acute changes.            RESULTS        Lab Results:      Results     Procedure Component Value Units Date/Time    Glucose Whole Blood - POCT [[id number]]  (Abnormal) Collected: 11/30/20 2103     Updated: 11/30/20 2105     Whole Blood Glucose POCT 229 mg/dL     Urinalysis Reflex to Microscopic Exam- Reflex to Culture [[id number]] Collected: 11/30/20 2101    Specimen: Urine, Clean Catch Updated: 11/30/20 2101    Troponin I [[id number]] Collected: 11/30/20 1847    Specimen: Blood Updated: 11/30/20 2001     Troponin I 0.02 ng/mL     Comprehensive metabolic panel [[id number]]  (Abnormal) Collected: 11/30/20 1847    Specimen: Blood Updated: 11/30/20 1955     Glucose 168 mg/dL      BUN 62.9 mg/dL      Creatinine 1.4 mg/dL      Sodium 528 mEq/L      Potassium 4.1 mEq/L      Chloride 101  mEq/L      CO2 17 mEq/L      Calcium 8.2 mg/dL      Protein, Total 5.7 g/dL      Albumin 3.4 g/dL      AST (SGOT) 22 U/L      ALT 22 U/L      Alkaline Phosphatase 42 U/L       Bilirubin, Total 0.6 mg/dL      Globulin 2.3 g/dL      Albumin/Globulin Ratio 1.5     Anion Gap 14.0    GFR [[id number]] Collected: 11/30/20 1847     Updated: 11/30/20 1955     EGFR 49.3    Prothrombin time/INR [[id number]] Collected: 11/30/20 1847    Specimen: Blood Updated: 11/30/20 1944     PT 11.2 sec      PT INR 0.9    CBC and differential [[id number]]  (Abnormal) Collected: 11/30/20 1847    Specimen: Blood Updated: 11/30/20 1942     WBC 9.77 x10 3/uL      Hgb 15.7 g/dL      Hematocrit 08.6 %      Platelets 158 x10 3/uL      RBC 5.19 x10 6/uL      MCV 94.6 fL      MCH 30.3 pg      MCHC 32.0 g/dL      RDW 14 %      MPV 10.3 fL      Neutrophils 89.1 %      Lymphocytes Automated 4.0 %      Monocytes 6.1 %      Eosinophils Automated 0.2 %      Basophils Automated 0.2 %      Immature Granulocytes 0.4 %      Nucleated RBC 0.0 /100 WBC      Neutrophils Absolute 8.70 x10 3/uL      Lymphocytes Absolute Automated 0.39 x10 3/uL      Monocytes Absolute Automated 0.60 x10 3/uL      Eosinophils Absolute Automated 0.02 x10 3/uL      Basophils Absolute Automated 0.02 x10 3/uL      Immature Granulocytes Absolute 0.04 x10 3/uL      Absolute NRBC 0.00 x10 3/uL     Glucose Whole Blood - POCT [[id number]] Collected: 11/30/20 1752     Updated: 11/30/20 1754     Whole Blood Glucose POCT 76 mg/dL     Glucose Whole Blood - POCT [[id number]] Collected: 11/30/20 1558     Updated: 11/30/20 1604     Whole Blood Glucose POCT 86 mg/dL             Radiology Results:      XR Chest  AP Portable   Final Result         Left basilar linear atelectasis.      Nonda Lou, MD    11/30/2020 5:14 PM      CT Head without Contrast   Final Result    No acute intracranial abnormality. Diffuse cerebral volume   loss and chronic small vessel ischemic changes.       Georgann Housekeeper, MD    11/30/2020 4:59 PM                Scribe Attestation:      I was acting as a Neurosurgeon for Maurine Minister, MD on [facility] Levo  Treatment Team: Scribe: Rolly Pancake      I am the first provider for this patient  and I personally performed the services documented. Treatment Team: Scribe: Rolly Pancake is scribing for me on Schobert,Jarryn GRANT. This note and the patient instructions accurately reflect work and decisions made by me.  Maurine Minister, MD            Maurine Minister, MD  12/01/20 1620

## 2020-11-30 NOTE — H&P (Addendum)
CNS HOSPITALIST ADMISSION HISTORY AND PHYSICAL EXAM    Date Time: 11/30/20 11:38 PM  Patient Name: Kent [name]  Attending Physician: Leeroy Bock, MD  Primary Care Physician: Pcp, None, MD    CC: confusion       History of Presenting Illness:   Kent [name] is a 75 y.o. male with h/o type 2 DM treated with insulin pump + oral medications, HLD, and hearing impairment who was brought to ER with chief complaint of confusion.   His wife states they are here visiting family and were supposed to drive back to [place] today. Patient was doing well in the morning except that he had some diarrhea. He was carrying a load to his car later when he fell on a neighbor's drive way. Apparently, he was confused and walked to the wrong house. He fell backward but denied hitting his head (the neighbor also didn't think patient hit his head). He looked pale and confused when he returned home. Wife checked his glucose and it was 184, then went higher to 275 after he ate. He took a nap and was progressively more confused when he got up. He was saying things that did not make sense. He was also generally very weak and unable to get up. He urinated on himself. She decided to call 911 at that point. She states he had low grade fever (temp 100F) at home. No headache, cough, congestion, chest pain, abdominal pain, nausea/vomiting.   The wife states patient has become less confused since arrival to ED.   At baseline, patient has no memory problem, does not get confused, and has no problem with gait.   He is vaccinated for COVID-19, and had a booster in Sept/2021.   His wife was positive for COVID-19 at the end of December but has tested negative since then.     Patient is awake and answering questions appropriately. However, difficult to get detailed history from him since he did not come with his hearing aide.     Past Medical History:     Past Medical History:   Diagnosis Date    Hyperlipidemia     Type 2  diabetes mellitus        Past Surgical History:   History reviewed. No pertinent surgical history.    Family History:   History reviewed. No pertinent family history.    Social History:     Social History     Socioeconomic History    Marital status: Married     Spouse name: Not on file    Number of children: Not on file    Years of education: Not on file    Highest education level: Not on file   Occupational History    Not on file   Tobacco Use    Smoking status: Former Smoker    Smokeless tobacco: Never Used   Substance and Sexual Activity    Alcohol use: Yes     Comment: 2 beers a month    Drug use: Not on file    Sexual activity: Not on file   Other Topics Concern    Not on file   Social History Narrative    Not on file     Social Determinants of Health     Financial Resource Strain:     Difficulty of Paying Living Expenses: Not on file   Food Insecurity:     Worried About Running Out of Food in the Last Year: Not on  file    Ran Out of Food in the Last Year: Not on file   Transportation Needs:     Lack of Transportation (Medical): Not on file    Lack of Transportation (Non-Medical): Not on file   Physical Activity:     Days of Exercise per Week: Not on file    Minutes of Exercise per Session: Not on file   Stress:     Feeling of Stress : Not on file   Social Connections:     Frequency of Communication with Friends and Family: Not on file    Frequency of Social Gatherings with Friends and Family: Not on file    Attends Religious Services: Not on file    Active Member of Clubs or Organizations: Not on file    Attends Banker Meetings: Not on file    Marital Status: Not on file   Intimate Partner Violence:     Fear of Current or Ex-Partner: Not on file    Emotionally Abused: Not on file    Physically Abused: Not on file    Sexually Abused: Not on file   Housing Stability:     Unable to Pay for Housing in the Last Year: Not on file    Number of Places Lived in the Last  Year: Not on file    Unstable Housing in the Last Year: Not on file       Allergies:   No Known Allergies    Medications:     Home Medications     Med List Status: In Progress Set By: Marikay Alar, RN at 11/30/2020  7:13 PM                clopidogrel (PLAVIX) 75 mg tablet     Take 75 mg by mouth daily     empagliflozin (Jardiance) 25 MG tablet     Take 25 mg by mouth every morning     fenofibrate micronized (LOFIBRA) 134 MG capsule     Take 134 mg by mouth every morning before breakfast     Insulin Infusion Pump (Insulin Pump [id number]) Kit     by Does not apply route     metFORMIN (GLUCOPHAGE) 850 MG tablet     Take 850 mg by mouth 2 (two) times daily with meals     pseudoephedrine-acetaminophen (TYLENOL SINUS) 30-500 MG Tab     Take 1 tablet by mouth every 4 (four) hours as needed     rosuvastatin (CRESTOR) 40 MG tablet     Take 40 mg by mouth daily          Review of Systems:   All other systems were reviewed and are negative except: as above    Physical Exam:     Vitals:    11/30/20 2155   BP: 133/59   Pulse: 99   Resp: 18   Temp: 98.6 F (37 C)   SpO2: 95%       General: Awake, answering questions appropriately, no acute distress.  HEENT: PERRLA, EOMI,  Anicteric sclera. Moist mucous membranes  Neck: Supple. No lymphadenopathy. No JVD. No carotid bruits  Cardiovascular: Regular rate and rhythm. No murmurs, rubs or gallops  Lungs: Clear to auscultation bilaterally. No wheezing, rhonchi, or rales  Abdomen: Soft,. Non-tender. Non-distended. No palpable mass. Normoactive bowel sounds   Extremities: No clubbing. No cyanosis. No edema  Neuro: Cranial nerves grossly intact. Generally weak without focal deficit.   Skin: No rashes or lesions  noted  Psych: Normal affect        Labs:     Results     Procedure Component Value Units Date/Time    Urinalysis Reflex to Microscopic Exam- Reflex to Culture [[id number]]  (Abnormal) Collected: 11/30/20 2101    Specimen: Urine, Clean Catch Updated: 11/30/20 2159     Urine Type  Urine, Clean Ca     Color, UA Amber     Clarity, UA Clear     Specific Gravity UA 1.027     Urine pH 5.0     Leukocyte Esterase, UA Negative     Nitrite, UA Negative     Protein, UR Negative     Glucose, UA >=500     Ketones UA 5     Urobilinogen, UA 2.0 mg/dL      Bilirubin, UA Negative     Blood, UA Negative    Glucose Whole Blood - POCT [[id number]]  (Abnormal) Collected: 11/30/20 2103     Updated: 11/30/20 2105     Whole Blood Glucose POCT 229 mg/dL     Troponin I [[id number]] Collected: 11/30/20 1847    Specimen: Blood Updated: 11/30/20 2001     Troponin I 0.02 ng/mL     Comprehensive metabolic panel [[id number]]  (Abnormal) Collected: 11/30/20 1847    Specimen: Blood Updated: 11/30/20 1955     Glucose 168 mg/dL      BUN 62.9 mg/dL      Creatinine 1.4 mg/dL      Sodium 528 mEq/L      Potassium 4.1 mEq/L      Chloride 101 mEq/L      CO2 17 mEq/L      Calcium 8.2 mg/dL      Protein, Total 5.7 g/dL      Albumin 3.4 g/dL      AST (SGOT) 22 U/L      ALT 22 U/L      Alkaline Phosphatase 42 U/L      Bilirubin, Total 0.6 mg/dL      Globulin 2.3 g/dL      Albumin/Globulin Ratio 1.5     Anion Gap 14.0    GFR [[id number]] Collected: 11/30/20 1847     Updated: 11/30/20 1955     EGFR 49.3    Prothrombin time/INR [[id number]] Collected: 11/30/20 1847    Specimen: Blood Updated: 11/30/20 1944     PT 11.2 sec      PT INR 0.9    CBC and differential [[id number]]  (Abnormal) Collected: 11/30/20 1847    Specimen: Blood Updated: 11/30/20 1942     WBC 9.77 x10 3/uL      Hgb 15.7 g/dL      Hematocrit 63.8 %      Platelets 158 x10 3/uL      RBC 5.19 x10 6/uL      MCV 94.6 fL      MCH 30.3 pg      MCHC 32.0 g/dL      RDW 14 %      MPV 10.3 fL      Neutrophils 89.1 %      Lymphocytes Automated 4.0 %      Monocytes 6.1 %      Eosinophils Automated 0.2 %      Basophils Automated 0.2 %      Immature Granulocytes 0.4 %      Nucleated RBC 0.0 /100 WBC      Neutrophils Absolute 8.70 x10 3/uL  Lymphocytes Absolute Automated 0.39 x10 3/uL       Monocytes Absolute Automated 0.60 x10 3/uL      Eosinophils Absolute Automated 0.02 x10 3/uL      Basophils Absolute Automated 0.02 x10 3/uL      Immature Granulocytes Absolute 0.04 x10 3/uL      Absolute NRBC 0.00 x10 3/uL     Glucose Whole Blood - POCT [[id number]] Collected: 11/30/20 1752     Updated: 11/30/20 1754     Whole Blood Glucose POCT 76 mg/dL     Glucose Whole Blood - POCT [[id number]] Collected: 11/30/20 1558     Updated: 11/30/20 1604     Whole Blood Glucose POCT 86 mg/dL           Radiology Results (24 Hour)     Procedure Component Value Units Date/Time    XR Chest  AP Portable [[id number]] Collected: 11/30/20 1704    Order Status: Completed Updated: 11/30/20 1716    Narrative:      HISTORY: Fall, confusion.    COMPARISON: None.    TECHNIQUE: AP chest radiograph.    FINDINGS:    There is no focal consolidation, pneumothorax or pleural effusion. The  cardiomediastinal silhouette is suboptimally assessed on this AP view.  Left basilar linear atelectasis is noted. No acute osseous abnormality  is seen.      Impression:          Left basilar linear atelectasis.    Nonda Lou, MD   11/30/2020 5:14 PM    CT Head without Contrast [[id number]] Collected: 11/30/20 1656    Order Status: Completed Updated: 11/30/20 1701    Narrative:      HISTORY: Pain after fall. Confusion.    COMPARISON: None.    TECHNIQUE: Noncontrast CT of the head. The following dose reduction  techniques were utilized: automatic exposure control and/or adjustment  of mA and/or kV according to patient size, and the use of iterative  reconstruction technique.    FINDINGS: There are hypoattenuating areas within the subcortical and  periventricular white matter which are nonspecific in nature but likely  reflect chronic small vessel ischemic disease in a patient of this age.  There is no evidence of acute territorial infarction or intracranial  hemorrhage. The ventricles and sulci are proportionately prominent,  reflecting diffuse cerebral  volume loss. There is no mass-effect or  midline shift. No extra-axial collections are seen. Intracranial  vascular calcifications are seen.. There are bilateral lens implants.  The visualized portions of the paranasal sinuses and mastoid air cells  are clear.      Impression:       No acute intracranial abnormality. Diffuse cerebral volume  loss and chronic small vessel ischemic changes.     Georgann Housekeeper, MD   11/30/2020 4:59 PM          Assessment/Plan:   Altered mental status : Encephalopathy Vs TIA/CVA  - admit to neurology unit with telemetry  - neuro checks   - obtain MRI brain and MRA head&neck  - continue plavix and statin   - check lipid panel, HgbA1C   - neurology consult * left message at [phone]    Diarrhea, already improved  Low grade fever  Generalized weakness   Non anion gap acidosis, likely from diarrhea  Renal insufficiency, baseline unknown  - Check for COVID-19  - IV fluid   - monitor vitals   - Repeat CBC and BMP in AM, check Mg level  - PT/OT consult  Type 2 DM  - hold metformin  - gluco checks with insulin coverage instead of insulin pump while inpatient   - diabetic diet    Hyperlipidemia  - continue statin and fenofibrate       Admission status: Observation     Signed by: Leeroy Bock, MD, MD   cc:Pcp, None, MD

## 2020-11-30 NOTE — ED Notes (Signed)
I completed a brief evaluation of this patient and placed initial orders in triage to expedite patient care.  I am not the primary physician taking care of this patient.    d     Kent Pons, MD  11/30/20 [phone]

## 2020-12-01 ENCOUNTER — Observation Stay: Payer: Medicare Other

## 2020-12-01 DIAGNOSIS — I10 Essential (primary) hypertension: Secondary | ICD-10-CM

## 2020-12-01 DIAGNOSIS — E785 Hyperlipidemia, unspecified: Secondary | ICD-10-CM

## 2020-12-01 DIAGNOSIS — Z794 Long term (current) use of insulin: Secondary | ICD-10-CM

## 2020-12-01 DIAGNOSIS — G9340 Encephalopathy, unspecified: Secondary | ICD-10-CM

## 2020-12-01 DIAGNOSIS — E1165 Type 2 diabetes mellitus with hyperglycemia: Secondary | ICD-10-CM

## 2020-12-01 DIAGNOSIS — R208 Other disturbances of skin sensation: Secondary | ICD-10-CM

## 2020-12-01 DIAGNOSIS — W19XXXA Unspecified fall, initial encounter: Secondary | ICD-10-CM

## 2020-12-01 DIAGNOSIS — Z9641 Presence of insulin pump (external) (internal): Secondary | ICD-10-CM

## 2020-12-01 DIAGNOSIS — E872 Acidosis: Secondary | ICD-10-CM | POA: Diagnosis not present

## 2020-12-01 DIAGNOSIS — R4182 Altered mental status, unspecified: Secondary | ICD-10-CM | POA: Diagnosis not present

## 2020-12-01 DIAGNOSIS — R531 Weakness: Secondary | ICD-10-CM | POA: Diagnosis not present

## 2020-12-01 DIAGNOSIS — R197 Diarrhea, unspecified: Secondary | ICD-10-CM | POA: Diagnosis not present

## 2020-12-01 DIAGNOSIS — R509 Fever, unspecified: Secondary | ICD-10-CM | POA: Diagnosis not present

## 2020-12-01 DIAGNOSIS — G459 Transient cerebral ischemic attack, unspecified: Secondary | ICD-10-CM | POA: Diagnosis not present

## 2020-12-01 LAB — GFR
EGFR: 53.7
EGFR: 53.7

## 2020-12-01 LAB — STOOL FOR WBC
Eosinophils Wright Stain: NONE SEEN
Lymphocytes Wright Stain: NONE SEEN
Neutrophils Wright Stain: NONE SEEN
RBC Wright Stain: NONE SEEN

## 2020-12-01 LAB — BASIC METABOLIC PANEL
Anion Gap: 15 (ref 5.0–15.0)
Anion Gap: 11 (ref 5.0–15.0)
BUN: 22 mg/dL (ref 9.0–28.0)
BUN: 21 mg/dL (ref 9.0–28.0)
CO2: 15 mEq/L — ABNORMAL LOW (ref 22–29)
CO2: 18 mEq/L — ABNORMAL LOW (ref 22–29)
Calcium: 8 mg/dL (ref 7.9–10.2)
Calcium: 7.9 mg/dL (ref 7.9–10.2)
Chloride: 103 mEq/L (ref 100–111)
Chloride: 103 mEq/L (ref 100–111)
Creatinine: 1.3 mg/dL (ref 0.7–1.3)
Creatinine: 1.3 mg/dL (ref 0.7–1.3)
Glucose: 158 mg/dL — ABNORMAL HIGH (ref 70–100)
Glucose: 333 mg/dL — ABNORMAL HIGH (ref 70–100)
Potassium: 4 mEq/L (ref 3.5–5.1)
Potassium: 4.2 mEq/L (ref 3.5–5.1)
Sodium: 133 mEq/L — ABNORMAL LOW (ref 136–145)
Sodium: 132 mEq/L — ABNORMAL LOW (ref 136–145)

## 2020-12-01 LAB — ECG 12-LEAD
Atrial Rate: 97 {beats}/min
Q-T Interval: 342 ms
QRS Duration: 86 ms
QTC Calculation (Bezet): 434 ms
R Axis: 68 degrees
T Axis: 23 degrees
Ventricular Rate: 97 {beats}/min

## 2020-12-01 LAB — CBC AND DIFFERENTIAL
Absolute NRBC: 0 10*3/uL (ref 0.00–0.00)
Basophils Absolute Automated: 0.03 10*3/uL (ref 0.00–0.08)
Basophils Automated: 0.4 %
Eosinophils Absolute Automated: 0.02 10*3/uL (ref 0.00–0.44)
Eosinophils Automated: 0.2 %
Hematocrit: 49.8 % — ABNORMAL HIGH (ref 37.6–49.6)
Hgb: 16.1 g/dL (ref 12.5–17.1)
Immature Granulocytes Absolute: 0.02 10*3/uL (ref 0.00–0.07)
Immature Granulocytes: 0.2 %
Lymphocytes Absolute Automated: 0.54 10*3/uL (ref 0.42–3.22)
Lymphocytes Automated: 6.4 %
MCH: 30.8 pg (ref 25.1–33.5)
MCHC: 32.3 g/dL (ref 31.5–35.8)
MCV: 95.4 fL (ref 78.0–96.0)
MPV: 9.8 fL (ref 8.9–12.5)
Monocytes Absolute Automated: 0.69 10*3/uL (ref 0.21–0.85)
Monocytes: 8.2 %
Neutrophils Absolute: 7.14 10*3/uL — ABNORMAL HIGH (ref 1.10–6.33)
Neutrophils: 84.6 %
Nucleated RBC: 0 /100 WBC (ref 0.0–0.0)
Platelets: 128 10*3/uL — ABNORMAL LOW (ref 142–346)
RBC: 5.22 10*6/uL (ref 4.20–5.90)
RDW: 14 % (ref 11–15)
WBC: 8.44 10*3/uL (ref 3.10–9.50)

## 2020-12-01 LAB — RAPID DRUG SCREEN, URINE
Barbiturate Screen, UR: NEGATIVE
Benzodiazepine Screen, UR: NEGATIVE
Cannabinoid Screen, UR: NEGATIVE
Cocaine, UR: NEGATIVE
Opiate Screen, UR: NEGATIVE
PCP Screen, UR: NEGATIVE
Urine Amphetamine Screen: NEGATIVE

## 2020-12-01 LAB — LIPID PANEL
Cholesterol / HDL Ratio: 3.6
Cholesterol: 93 mg/dL (ref 0–199)
HDL: 26 mg/dL — ABNORMAL LOW (ref 40–9999)
LDL Calculated: 49 mg/dL (ref 0–99)
Triglycerides: 88 mg/dL (ref 34–149)
VLDL Calculated: 18 mg/dL (ref 10–40)

## 2020-12-01 LAB — GLUCOSE WHOLE BLOOD - POCT
Whole Blood Glucose POCT: 147 mg/dL — ABNORMAL HIGH (ref 70–100)
Whole Blood Glucose POCT: 186 mg/dL — ABNORMAL HIGH (ref 70–100)
Whole Blood Glucose POCT: 246 mg/dL — ABNORMAL HIGH (ref 70–100)
Whole Blood Glucose POCT: 299 mg/dL — ABNORMAL HIGH (ref 70–100)
Whole Blood Glucose POCT: 333 mg/dL — ABNORMAL HIGH (ref 70–100)

## 2020-12-01 LAB — HEMOLYSIS INDEX
Hemolysis Index: 21 (ref 0–24)
Hemolysis Index: 42 — ABNORMAL HIGH (ref 0–24)

## 2020-12-01 LAB — VITAMIN D,25 OH,TOTAL: Vitamin D, 25 OH, Total: 18 ng/mL — ABNORMAL LOW (ref 30–100)

## 2020-12-01 LAB — VITAMIN B12: Vitamin B-12: 575 pg/mL (ref 211–911)

## 2020-12-01 LAB — TSH: TSH: 0.71 u[IU]/mL (ref 0.35–4.94)

## 2020-12-01 LAB — SODIUM, URINE, RANDOM: Urine Sodium Random: <20 mEq/L

## 2020-12-01 LAB — CLOSTRIDIUM DIFFICILE TOXIN B PCR: Stool Clostridium difficile Toxin B PCR: NEGATIVE

## 2020-12-01 LAB — COVID-19 (SARS-COV-2): SARS CoV 2 Overall Result: NEGATIVE

## 2020-12-01 LAB — KETONES, URINE: Ketones UA: 5 — AB

## 2020-12-01 LAB — HEMOGLOBIN A1C
Average Estimated Glucose: 162.8 mg/dL
Hemoglobin A1C: 7.3 % — ABNORMAL HIGH (ref 4.6–5.9)

## 2020-12-01 LAB — CHLORIDE, URINE, RANDOM: Chloride, UR: <20 mEq/L

## 2020-12-01 LAB — MAGNESIUM: Magnesium: 1.9 mg/dL (ref 1.6–2.6)

## 2020-12-01 LAB — POTASSIUM, URINE, RANDOM: Urine Potassium Random: 27 mEq/L

## 2020-12-01 LAB — LACTIC ACID, PLASMA: Lactic Acid: 1.9 mmol/L (ref 0.2–2.0)

## 2020-12-01 LAB — AMMONIA: Ammonia: 17 umol/L — ABNORMAL LOW (ref 18–72)

## 2020-12-01 LAB — FOLATE: Folate: 15 ng/mL

## 2020-12-01 MED ORDER — CALCIUM CARBONATE ANTACID 500 MG PO CHEW
1000.0000 mg | CHEWABLE_TABLET | Freq: Four times a day (QID) | ORAL | Status: DC | PRN
Start: 2020-12-01 — End: 2020-12-02

## 2020-12-01 MED ORDER — ACETAMINOPHEN 325 MG PO TABS
650.0000 mg | ORAL_TABLET | Freq: Four times a day (QID) | ORAL | Status: DC | PRN
Start: 2020-12-01 — End: 2020-12-02
  Administered 2020-12-01: 650 mg via ORAL
  Filled 2020-12-01: qty 2

## 2020-12-01 MED ORDER — GADOTERATE MEGLUMINE 10 MMOL/20ML IV SOLN (CLARISCAN)
20.0000 mL | Freq: Once | INTRAVENOUS | Status: AC | PRN
Start: 2020-12-01 — End: 2020-12-01
  Administered 2020-12-01: 04:00:00 20 mL via INTRAVENOUS

## 2020-12-01 MED ORDER — ASPIRIN 81 MG PO CHEW
81.0000 mg | CHEWABLE_TABLET | Freq: Every day | ORAL | Status: DC
Start: 2020-12-01 — End: 2020-12-02
  Administered 2020-12-01 – 2020-12-02 (×2): 81 mg via ORAL
  Filled 2020-12-01 (×2): qty 1

## 2020-12-01 MED ORDER — SODIUM CHLORIDE 0.45 % IV SOLN
100.0000 mL/h | INTRAVENOUS | Status: DC
Start: 2020-12-01 — End: 2020-12-02
  Administered 2020-12-01 (×2): 100 mL/h via INTRAVENOUS
  Filled 2020-12-01 (×2): qty 1000

## 2020-12-01 MED ORDER — INSULIN LISPRO 100 UNIT/ML SC SOLN
1.0000 [IU] | Freq: Three times a day (TID) | SUBCUTANEOUS | Status: DC
Start: 2020-12-01 — End: 2020-12-01
  Administered 2020-12-01: 12:00:00 3 [IU] via SUBCUTANEOUS
  Administered 2020-12-01: 10:00:00 2 [IU] via SUBCUTANEOUS
  Filled 2020-12-01: qty 9
  Filled 2020-12-01: qty 3

## 2020-12-01 MED ORDER — INSULIN GLARGINE 100 UNIT/ML SC SOLN
25.0000 [IU] | SUBCUTANEOUS | Status: DC
Start: 2020-12-02 — End: 2020-12-01

## 2020-12-01 MED ORDER — INSULIN LISPRO 100 UNIT/ML SC SOLN
1.0000 [IU] | Freq: Every evening | SUBCUTANEOUS | Status: DC
Start: 2020-12-01 — End: 2020-12-02
  Administered 2020-12-01: 22:00:00 1 [IU] via SUBCUTANEOUS
  Filled 2020-12-01: qty 3

## 2020-12-01 MED ORDER — FENOFIBRATE MICRONIZED 67 MG PO CAPS
134.0000 mg | ORAL_CAPSULE | Freq: Every morning | ORAL | Status: DC
Start: 2020-12-01 — End: 2020-12-02
  Administered 2020-12-01 – 2020-12-02 (×2): 134 mg via ORAL
  Filled 2020-12-01: qty 1
  Filled 2020-12-01 (×2): qty 2

## 2020-12-01 MED ORDER — INSULIN LISPRO 100 UNIT/ML SC SOLN
1.0000 [IU] | Freq: Three times a day (TID) | SUBCUTANEOUS | Status: DC
Start: 2020-12-01 — End: 2020-12-02
  Administered 2020-12-01: 17:00:00 7 [IU] via SUBCUTANEOUS
  Administered 2020-12-02 (×2): 3 [IU] via SUBCUTANEOUS
  Filled 2020-12-01: qty 12
  Filled 2020-12-01: qty 15

## 2020-12-01 MED ORDER — INSULIN LISPRO 100 UNIT/ML SC SOLN
6.0000 [IU] | Freq: Three times a day (TID) | SUBCUTANEOUS | Status: DC
Start: 2020-12-01 — End: 2020-12-02
  Administered 2020-12-01 – 2020-12-02 (×2): 6 [IU] via SUBCUTANEOUS

## 2020-12-01 MED ORDER — INSULIN GLARGINE 100 UNIT/ML SC SOLN
10.0000 [IU] | Freq: Once | SUBCUTANEOUS | Status: DC
Start: 2020-12-01 — End: 2020-12-01

## 2020-12-01 MED ORDER — INSULIN LISPRO 100 UNIT/ML SC SOLN
1.0000 [IU] | Freq: Every evening | SUBCUTANEOUS | Status: DC
Start: 2020-12-01 — End: 2020-12-01

## 2020-12-01 MED ORDER — MELATONIN 3 MG PO TABS
3.0000 mg | ORAL_TABLET | Freq: Every evening | ORAL | Status: DC | PRN
Start: 2020-12-01 — End: 2020-12-02

## 2020-12-01 MED ORDER — INSULIN GLARGINE 100 UNIT/ML SC SOLN
30.0000 [IU] | SUBCUTANEOUS | Status: DC
Start: 2020-12-02 — End: 2020-12-02
  Administered 2020-12-02: 09:00:00 30 [IU] via SUBCUTANEOUS
  Filled 2020-12-01: qty 30

## 2020-12-01 MED ORDER — ENOXAPARIN SODIUM 40 MG/0.4ML SC SOLN
40.0000 mg | Freq: Every day | SUBCUTANEOUS | Status: DC
Start: 2020-12-01 — End: 2020-12-02
  Administered 2020-12-01 – 2020-12-02 (×2): 40 mg via SUBCUTANEOUS
  Filled 2020-12-01 (×2): qty 0.4

## 2020-12-01 MED ORDER — SENNOSIDES-DOCUSATE SODIUM 8.6-50 MG PO TABS
2.0000 | ORAL_TABLET | Freq: Two times a day (BID) | ORAL | Status: DC | PRN
Start: 2020-12-01 — End: 2020-12-02

## 2020-12-01 MED ORDER — INSULIN GLARGINE 100 UNIT/ML SC SOLN
15.0000 [IU] | Freq: Once | SUBCUTANEOUS | Status: AC
Start: 2020-12-01 — End: 2020-12-01
  Administered 2020-12-01: 16:00:00 15 [IU] via SUBCUTANEOUS
  Filled 2020-12-01: qty 15

## 2020-12-01 MED ORDER — GLUCAGON 1 MG IJ SOLR (WRAP)
1.0000 mg | INTRAMUSCULAR | Status: DC | PRN
Start: 2020-12-01 — End: 2020-12-02

## 2020-12-01 MED ORDER — HYDRALAZINE HCL 20 MG/ML IJ SOLN
10.0000 mg | INTRAMUSCULAR | Status: DC | PRN
Start: 2020-12-01 — End: 2020-12-02

## 2020-12-01 MED ORDER — CLOPIDOGREL BISULFATE 75 MG PO TABS
75.0000 mg | ORAL_TABLET | Freq: Every day | ORAL | Status: DC
Start: 2020-12-01 — End: 2020-12-02
  Administered 2020-12-01 – 2020-12-02 (×2): 75 mg via ORAL
  Filled 2020-12-01 (×2): qty 1

## 2020-12-01 MED ORDER — INSULIN LISPRO 100 UNIT/ML SC SOLN
3.0000 [IU] | Freq: Three times a day (TID) | SUBCUTANEOUS | Status: DC
Start: 2020-12-01 — End: 2020-12-01
  Administered 2020-12-01: 15:00:00 3 [IU] via SUBCUTANEOUS
  Filled 2020-12-01: qty 9

## 2020-12-01 MED ORDER — GLUCOSE 40 % PO GEL
15.0000 g | ORAL | Status: DC | PRN
Start: 2020-12-01 — End: 2020-12-02

## 2020-12-01 MED ORDER — ROSUVASTATIN CALCIUM 40 MG PO TABS
40.0000 mg | ORAL_TABLET | Freq: Every evening | ORAL | Status: DC
Start: 2020-12-01 — End: 2020-12-02
  Administered 2020-12-01: 22:00:00 40 mg via ORAL
  Filled 2020-12-01 (×2): qty 1

## 2020-12-01 MED ORDER — LABETALOL HCL 5 MG/ML IV SOLN (WRAP)
10.0000 mg | INTRAVENOUS | Status: DC | PRN
Start: 2020-12-01 — End: 2020-12-02

## 2020-12-01 MED ORDER — SODIUM CHLORIDE 0.9 % IV SOLN
INTRAVENOUS | Status: DC
Start: 2020-12-01 — End: 2020-12-01

## 2020-12-01 MED ORDER — INSULIN GLARGINE 100 UNIT/ML SC SOLN
10.0000 [IU] | SUBCUTANEOUS | Status: DC
Start: 2020-12-01 — End: 2020-12-01
  Administered 2020-12-01: 15:00:00 10 [IU] via SUBCUTANEOUS
  Filled 2020-12-01: qty 10

## 2020-12-01 MED ORDER — DEXTROSE 50 % IV SOLN
12.5000 g | INTRAVENOUS | Status: DC | PRN
Start: 2020-12-01 — End: 2020-12-02

## 2020-12-01 NOTE — Plan of Care (Signed)
Pt arrived to unit at 0620. Pt oriented to unit and goals of care. Pt A&OX4, HOH, clear speech, FC, MAE with generalized weakness. Full sensation. No belongings at bedside. NaCl infusing at 126mL/hr. Febrile TMAX 100.8 - PRN tylenol given.

## 2020-12-01 NOTE — UM Notes (Signed)
Order [id number]    11/30/20 2323  Adult Admit to Observation  Once               Initial Review  Class: Observation    Summary:  The patient is a 75 year old male with a history of DM type 2 (insulin pump and oral medications), HLD, and hearing impairment who presented to the hospital with confusion.  Patient's wife reports the patient experienced diarrhea earlier in the day and also fell in a neighbor's driveway.  Patient had increasing confusion and an episode of urinary incontinence.        Care Day - 12/01/2020  Tmax: 101.4  HR: 85, 95, 100, 104, 100  Resp: 24, 20, 18, 18, 19  BP: 123/63, 115/59, 132/69, 121/65, 108/68  SpO2: 90-95% on room air      Labs:  Sodium: 133  HDL: 26  Hemoglobin A1C: 7.3  Hct: 49.8  Platelets: 128      Medications:  Current Facility-Administered Medications   Medication Dose Route Frequency    clopidogrel  75 mg Oral Daily    enoxaparin  40 mg Subcutaneous Daily    fenofibrate micronized  134 mg Oral QAM AC    insulin glargine  10 Units Subcutaneous Daily    insulin lispro  1-3 Units Subcutaneous QHS    insulin lispro  1-5 Units Subcutaneous TID AC    insulin lispro  3 Units Subcutaneous TID AC    rosuvastatin  40 mg Oral QHS     0.9% NaCl 100 ml/hr IV continuous (discontinued at 1140)  0.9% NaCl 100 ml/hr IV continuous (discontinued at 0147)  Sodium Chloride 0.45% with sodium bicarbonate 100 mEq 100 ml/hr IV continuous      Plan of Care:  -  Telemetry monitoring  -  Urinalysis  -  PT/OT  -  SLP for cognitive evaluation  -  Neurology consult  -  ID consult  -  IV fluids for continued diarrhea with CKD  -  Endocrinology consult  -  ID consult      ___________________________________________________________________      Care Day - 11/30/2020    Vitals:  Temp: Afebrile  HR: 116, 99  Resp: 17-18  BP: 113/60, 133/59  SpO2: 95% on room air  Weight: 95.7 kg    Labs:  Creatinine: 1.4  Sodium: 132  Albumin: 3.4  WBC: 9.77      Urinalysis:  Color, UA: Amber  Glucose, UA: >=500  Ketones UA:  5      Imaging:  X-Ray Chest  Left basilar linear atelectasis.       Medications:  Dextrose 10% 250 ml IV bolus x once  Sodium Chloride 1,000 ml IV bolus x once  0.9% NaCl 100 ml/hr IV continuous      Plan of Care:  -  Telemetry monitoring  -  Neurology consult  -  Neuro checks   -  MRI brain  -  MRA head and neck  -  IV fluids  -  PT/OT    Doree Albee RN, BSN  UR Case Manager  [facility]  [email or web address]  4:01 PM  12/01/2020

## 2020-12-01 NOTE — Plan of Care (Signed)
NURSING PROGRESS NOTE: STROKE UNIT    Patient Name: Kent [name] (75 y.o. male)  Admission Date: 11/30/2020 [facility] Day 0)      Recent Labs   Lab 12/01/20  1236   Sodium 132*   Potassium 4.2   Chloride 103   CO2 18*   BUN 21.0   Creatinine 1.3   EGFR 53.7   Glucose 333*   Calcium 7.9       Recent Labs   Lab 12/01/20  0431   WBC 8.44   Hgb 16.1   Hematocrit 49.8*   Platelets 128*         Patient Lines/Drains/Airways Status       Active Lines, Drains and Airways       Name Placement date Placement time Site Days    Peripheral IV 11/30/20 18 G Right Antecubital 11/30/20  1607  Antecubital  1                       Braden Scale Score: 19 (12/01/20 0800)      Skin Integrity: Abrasion,Bruising,Scaling  Abrasion Skin Location: left nuckle  Bruising Skin Location: BUE         Safety Checklist   1:1 Sitter n    Avasys n     ORAL CARE y [name]   RESTRAINTS n      Interpreter Services:  Does the patient require an Interpreter? n    If yes, what form of interpreter services was used?     If family was utilized, is interpreter waiver form signed and in the chart? n      ASSESSMENT/PLAN:    Last BM: 1/19 BM x3    Pending Orders: C.Diff & stool cx  results pending, am labs    Discharge Plan: Acute Rehab    Social/Family visits: Wife    POC Update: Wife & Patient    Shift Note: Pt A&Ox3-4 (off to time this am but improved throughout shift ), moves all extremities , fatigues easily.  OOB to chair and BR with 1 person assist.  Denies pain.  Temp up to  Multiple labs this am - blood Cxs sent.  Multiple labs drawn. Diarrhea x 3 -  Urine and stool specimen sent .  Placed on Isolation for C.Diff until results in.  Blood sugars elevated.  Endocrine cx done.  Started on Lantus and scheduled + SSI given.  Safety reinforced: side rails up, fall mat in place and bed alarm on.    PPE worn in patient's room:  goggles, facemask, gloves        Problem: Safety  Goal: Patient will be free from injury during hospitalization  Outcome:  Progressing  Flowsheets (Taken 12/01/2020 1815)  Patient will be free from injury during hospitalization:   Assess patient's risk for falls and implement fall prevention plan of care per policy   Provide and maintain safe environment   Use appropriate transfer methods   Ensure appropriate safety devices are available at the bedside   Include patient/ family/ care giver in decisions related to safety   Hourly rounding  Goal: Patient will be free from infection during hospitalization  Outcome: Progressing  Flowsheets (Taken 12/01/2020 1815)  Free from Infection during hospitalization:   Assess and monitor for signs and symptoms of infection   Monitor lab/diagnostic results   Encourage patient and family to use good hand hygiene technique     Problem: Discharge Barriers  Goal: Patient will be  discharged home or other facility with appropriate resources  Outcome: Progressing  Flowsheets (Taken 12/01/2020 1815)  Discharge to home or other facility with appropriate resources:   Provide appropriate patient education   Provide information on available health resources   Initiate discharge planning     Problem: Moderate/High Fall Risk Score >5  Goal: Patient will remain free of falls  Outcome: Progressing  Flowsheets (Taken 12/01/2020 0800)  High (Greater than 13):   HIGH-Visual cue at entrance to patient's room   HIGH-Bed alarm on at all times while patient in bed   HIGH-Utilize chair pad alarm for patient while in the chair   HIGH-Apply yellow "Fall Risk" arm band   HIGH-Initiate use of floor mats as appropriate   HIGH-Consider use of low bed     Problem: Inadequate Cardiac Output  Goal: Adequate tissue perfusion will be maintained  Outcome: Progressing  Flowsheets (Taken 12/01/2020 1815)  Adequate tissue perfusion will be maintained:   Monitor/assess vital signs   Monitor/assess lab values and report abnormal values   Increase mobility as tolerated/progressive mobility   Perform active/passive ROM    Reinforce ankle pump exercises   VTE Prevention: Administer anticoagulant(s) and/or apply anti-embolism stockings/devices as ordered

## 2020-12-01 NOTE — Consults (Addendum)
ENDOCRINE NEW CONSULT    Date Time: 12/01/20 12:17 PM  Patient Name: Kent [name]  Requesting Physician: Durene Romans, DO  Consulting Physician: Lind Guest, MD  Admission Date: 11/30/2020    Primary Care Physician: Marisa Sprinkles, MD    Endocrinology Consultation for: Management of diabetes  Payor: MEDICARE [employer] / Plan: [employer]BS OOS MEDICARE HMO / Product Type: MANAGED MEDICARE /     Total time of encounter was 75 min > 50% time was spent reviewing his chart, updating his orders, and at the bedside counseling & coordination of care as stated in plan below- including discussing risk and benefits of treatment options and importance of close monitoring for hypoglycemia or hyperglycemia.    Assessment/Recommendations:   [name] is a 75 y.o. male with past medical history of type 2 diabetes, hyperlipidemia, hypertension, who presented to the hospital on 11/30/2020 with altered mental status and fall, found to have fevers and diarrhea. Endocrinology consulted for management of type 2 diabetes.  Home diabetes regimen: Minimed Insulin pump (with Novolog) - basal settings: 0000-0800: 3.6 units/hr, 0800-000: 3.8 units/hr (total daily basal insulin dose of 88.8 units); I:C ratio of 1:4, , Jardiance 25 mg p.o. once daily, metformin 650 mg p.o. twice daily; HbA1c this admission: 7.3%    1. Type 2 diabetes with hyperglycemia   Patient with both fasting and prandial hyperglycemia   Has anion gap acidosis (17 on admission, now improved to 15) with mild ketonuria (5)    Insulin pump was discontinued earlier this morning.  Given altered mental status, recommend transitioning to basal/bolus insulin regimen.  The insulin pump is in the patient's wife's possession   Received Lantus 10 units x1 earlier   Repeat urinary ketones and BMP now   Give additional Lantus 15 units x1 now   Start Lantus 30 units qAM starting tomorrow   Increase Lispro 10 units with meals   Change from low-dose to medium-dose SSI before meals and  nightly   Recommend further evaluation for anion gap acidosis-consider getting lactate if patient was previously febrile. Additionally elevated anion gap can be from starvation ketosis given diarrhea/decreased PO intake.   Patient should follow-up with his outpatient endocrinologist in [facility] after hospital discharge      Kathyrn Drown T, DO, thank you for this consultation.  We will follow the patient with you during this hospitalization.  Please contact me with any questions or issues.    Lind Guest, MD  [facility] Endocrinology  Epic Chat or Pager [phone]  Hours Monday-Friday; 8am-5pm  On nights, holidays and weekends please page the endocrine consult pager [phone]) for emergencies    History of Presenting Illness:   [name] is a 75 y.o. male with past medical history of type 2 diabetes, hyperlipidemia, hypertension, who presented to the hospital on 11/30/2020 with altered mental status and fall, found to have fevers and diarrhea. Endocrinology consulted for management of type 2 diabetes.    Patient and his wife are in the area visiting family.  They originally from [place].   Discussed with patient's wife regarding his medical history as he is currently working with physical therapy.  Patient presented to the hospital yesterday after having confusion and fall. He has 3 grandchildren that live in the area.  Had extensive evaluation with neurology which revealed normal CT head without contrast, normal MRI brain, and normal MRI head/neck.    Patient has a history of type 2 diabetes he is currently on  insulin pump (see above settings), as well as Jardiance 25 mg p.o. once daily and metformin 850 mg p.o. twice daily.  His diabetes is currently managed by his primary care physician, Dr. Guerry Bruin.  Patient's wife reports that patient has previously had symptomatic hypoglycemia.  She shows me that the patient last administered a bolus of 25 units on his insulin pump, however, it is concerning  because it may have been accidental.     Past Medical History:     Past Medical History:   Diagnosis Date    Hyperlipidemia     Type 2 diabetes mellitus        Past Surgical History:   History reviewed. No pertinent surgical history.    Social History:   Patient  reports that he has quit smoking. He has never used smokeless tobacco. He reports current alcohol use.    Family History:   Patient reports family history is not on file.     Allergies:   No Known Allergies    Medications:     Medications Prior to Admission   Medication Sig    clopidogrel (PLAVIX) 75 mg tablet Take 75 mg by mouth daily    empagliflozin (Jardiance) 25 MG tablet Take 25 mg by mouth every morning    fenofibrate micronized (LOFIBRA) 134 MG capsule Take 134 mg by mouth every morning before breakfast    Insulin Infusion Pump (Insulin Pump [id number]) Kit by Does not apply route    metFORMIN (GLUCOPHAGE) 850 MG tablet Take 850 mg by mouth 2 (two) times daily with meals    pseudoephedrine-acetaminophen (TYLENOL SINUS) 30-500 MG Tab Take 1 tablet by mouth every 4 (four) hours as needed    rosuvastatin (CRESTOR) 40 MG tablet Take 40 mg by mouth daily       Current Facility-Administered Medications   Medication Dose Route Frequency    clopidogrel  75 mg Oral Daily    enoxaparin  40 mg Subcutaneous Daily    fenofibrate micronized  134 mg Oral QAM AC    insulin lispro  1-3 Units Subcutaneous QHS    insulin lispro  1-5 Units Subcutaneous TID AC    rosuvastatin  40 mg Oral QHS        Available old records reviewed, including:  EPIC  Review of Systems:   All other systems were reviewed and are negative except as per HPI    Physical Exam:   Temp:  [97.1 F (36.2 C)-101.4 F (38.6 C)] 98.2 F (36.8 C)  Heart Rate:  [85-116] 100  Resp Rate:  [17-24] 19  BP: (108-133)/(59-69) 108/68   Wt Readings from Last 3 Encounters:   11/30/20 95.7 kg (211 lb)   12/01/20 99.8 kg (220 lb)     Body mass index is 29.43 kg/m.    Intake/Output Summary (Last 24  hours) at 12/01/2020 1217  Last data filed at 12/01/2020 0426  Gross per 24 hour   Intake 250 ml   Output 500 ml   Net -250 ml       General: Overweight, awake, alert, oriented x 3; no acute distress  HEENT: eomi, sclera anicteric   Lungs: normal work of breathing on room air  Skin: no rashes or lesions noted    Labs:     Whole Blood Glucose POCT   Date/Time Value Ref Range Status   12/01/2020 1124 299 (H) 70 - 100 mg/dL Final   04/54/0981 1914 246 (H) 70 - 100 mg/dL Final   78/29/5621  0037 147 (H) 70 - 100 mg/dL Final   16/08/9603 5409 229 (H) 70 - 100 mg/dL Final   81/19/1478 2956 76 70 - 100 mg/dL Final       Hemoglobin A1C (%)   Date Value   12/01/2020 7.3 (H)       Recent Labs     12/01/20  0431 11/30/20  1847   WBC 8.44 9.77*   Hgb 16.1 15.7   Hematocrit 49.8* 49.1   Platelets 128* 158       Recent Labs     12/01/20  0431 11/30/20  1847   Sodium 133* 132*   Potassium 4.0 4.1   Chloride 103 101   CO2 15* 17*   BUN 22.0 25.0   Creatinine 1.3 1.4*   Glucose 158* 168*   Calcium 8.0 8.2   Magnesium 1.9  --        Recent Labs     11/30/20  1847   AST (SGOT) 22   ALT 22   Alkaline Phosphatase 42   Protein, Total 5.7*   Albumin 3.4*       Recent Labs     11/30/20  1847   PT 11.2   PT INR 0.9       TSH   Date Value Ref Range Status   12/01/2020 0.71 0.35 - 4.94 uIU/mL Final       Imaging:     Imaging personally reviewed, including: N/A      Signed by: Lind Guest, MD    cc: Durene Romans, DO  Pcp, None, MD

## 2020-12-01 NOTE — Progress Notes (Signed)
[facility]PITAL[facility] PROGRESS NOTE    Date Time: 12/01/20 11:31 AM  Patient Name: Kent [name]  Attending Physician: Kathyrn Drown T, DO      History of Presenting Illness and Interval History/24 hour Events:   HPI per admitting MD:  " Kole Hilyard is a 75 y.o. male with h/o type 2 DM treated with insulin pump + oral medications, HLD, and hearing impairment who was brought to ER with chief complaint of confusion.   His wife states they are here visiting family and were supposed to drive back to [place] today. Patient was doing well in the morning except that he had some diarrhea. He was carrying a load to his car later when he fell on a neighbor's drive way. Apparently, he was confused and walked to the wrong house. He fell backward but denied hitting his head (the neighbor also didn't think patient hit his head). He looked pale and confused when he returned home. Wife checked his glucose and it was 184, then went higher to 275 after he ate. He took a nap and was progressively more confused when he got up. He was saying things that did not make sense. He was also generally very weak and unable to get up. He urinated on himself. She decided to call 911 at that point. She states he had low grade fever (temp 100F) at home. No headache, cough, congestion, chest pain, abdominal pain, nausea/vomiting.   The wife states patient has become less confused since arrival to ED.   At baseline, patient has no memory problem, does not get confused, and has no problem with gait.   He is vaccinated for COVID-19, and had a booster in Sept/2021.   His wife was positive for COVID-19 at the end of December but has tested negative since then.     Patient is awake and answering questions appropriately. However, difficult to get detailed history from him since he did not come with his hearing aide."    1/19: Noted fever overnight.  Patient denied any chest pain, shortness of breath.  Has had frequent diarrheas.      Physical  Exam:     Vitals:    12/01/20 1123   BP: 108/68   Pulse: 100   Resp: 19   Temp: 98.2 F (36.8 C)   SpO2: 95%       Intake and Output Summary (Last 24 hours) at Date Time    Intake/Output Summary (Last 24 hours) at 12/01/2020 1131  Last data filed at 12/01/2020 0426  Gross per 24 hour   Intake 250 ml   Output 500 ml   Net -250 ml       General: awake, alert, oriented x 3; no acute distress.  HEENT: perrla, eomi, sclera anicteric  oropharynx clear without lesions, mucous membranes moist  Neck: supple, no lymphadenopathy, no thyromegaly, no JVD, no carotid bruits  Cardiovascular: regular rate and rhythm, no murmurs, rubs or gallops  Lungs: clear to auscultation bilaterally, without wheezing, rhonchi, or rales  Abdomen: soft, non-tender, non-distended; no palpable masses, no hepatosplenomegaly, normoactive bowel sounds, no rebound or guarding  Extremities: no clubbing, cyanosis, or edema  Neuro: cranial nerves grossly intact, strength 5/5 in upper and lower extremities, sensation intact,   Skin: no rashes or lesions noted      Medications:     Current Facility-Administered Medications   Medication Dose Route Frequency    clopidogrel  75 mg Oral Daily    enoxaparin  40  mg Subcutaneous Daily    fenofibrate micronized  134 mg Oral QAM AC    insulin lispro  1-3 Units Subcutaneous QHS    insulin lispro  1-5 Units Subcutaneous TID AC    rosuvastatin  40 mg Oral QHS         Labs:     Results     Procedure Component Value Units Date/Time    Glucose Whole Blood - POCT [[id number]]  (Abnormal) Collected: 12/01/20 1124     Updated: 12/01/20 1129     Whole Blood Glucose POCT 299 mg/dL     Culture Blood Aerobic and Anaerobic [[id number]] Collected: 12/01/20 0854    Specimen: Blood, Venipuncture Updated: 12/01/20 1005    Narrative:      The order will result in two separate 8-11ml bottles  Please do NOT order repeat blood cultures if one has been  drawn within the last 48 hours  UNLESS concerned for  endocarditis  AVOID BLOOD  CULTURE DRAWS FROM CENTRAL LINE IF POSSIBLE  Indications:->Fever of greater than 101.5  1 BLUE+1 PURPLE    Culture Blood Aerobic and Anaerobic [[id number]] Collected: 12/01/20 0854    Specimen: Blood, Venipuncture Updated: 12/01/20 1005    Narrative:      The order will result in two separate 8-33ml bottles  Please do NOT order repeat blood cultures if one has been  drawn within the last 48 hours  UNLESS concerned for  endocarditis  AVOID BLOOD CULTURE DRAWS FROM CENTRAL LINE IF POSSIBLE  Indications:->Fever of greater than 101.5  1 BLUE+1 PURPLE    Glucose Whole Blood - POCT [[id number]]  (Abnormal) Collected: 12/01/20 0746     Updated: 12/01/20 0753     Whole Blood Glucose POCT 246 mg/dL     TSH [[id number]] Collected: 12/01/20 0431    Specimen: Blood Updated: 12/01/20 0734     TSH 0.71 uIU/mL     Narrative:      This is NOT the correct Test for Patients with  Hemoglobinopathy.    Basic Metabolic Panel [[id number]]  (Abnormal) Collected: 12/01/20 0431    Specimen: Blood Updated: 12/01/20 0711     Glucose 158 mg/dL      BUN 53.6 mg/dL      Creatinine 1.3 mg/dL      Calcium 8.0 mg/dL      Sodium 644 mEq/L      Potassium 4.0 mEq/L      Chloride 103 mEq/L      CO2 15 mEq/L      Anion Gap 15.0    Narrative:      This is NOT the correct Test for Patients with  Hemoglobinopathy.    Magnesium [[id number]] Collected: 12/01/20 0431    Specimen: Blood Updated: 12/01/20 0711     Magnesium 1.9 mg/dL     Narrative:      This is NOT the correct Test for Patients with  Hemoglobinopathy.    GFR [[id number]] Collected: 12/01/20 0431     Updated: 12/01/20 0711     EGFR 53.7    Narrative:      This is NOT the correct Test for Patients with  Hemoglobinopathy.    Vitamin B12 [[id number]] Collected: 12/01/20 0431    Specimen: Blood Updated: 12/01/20 0703     Vitamin B-12 575 pg/mL     Narrative:      This is NOT the correct Test for Patients with  Hemoglobinopathy.    Hemolysis index [[id number]] Collected: 12/01/20 0431     Updated:  12/01/20  0640     Hemolysis Index 21    Narrative:      This is NOT the correct Test for Patients with  Hemoglobinopathy.    Lipid panel [[id number]]  (Abnormal) Collected: 12/01/20 0431    Specimen: Blood Updated: 12/01/20 0640     Cholesterol 93 mg/dL      Triglycerides 88 mg/dL      HDL 26 mg/dL      LDL Calculated 49 mg/dL      VLDL Calculated 18 mg/dL      Cholesterol / HDL Ratio 3.6    Narrative:      This is NOT the correct Test for Patients with  Hemoglobinopathy.    Hemoglobin A1C [[id number]]  (Abnormal) Collected: 12/01/20 0431    Specimen: Blood Updated: 12/01/20 0630     Hemoglobin A1C 7.3 %      Average Estimated Glucose 162.8 mg/dL     Narrative:      This is NOT the correct Test for Patients with  Hemoglobinopathy.    CBC and differential [[id number]]  (Abnormal) Collected: 12/01/20 0431    Specimen: Blood Updated: 12/01/20 0509     WBC 8.44 x10 3/uL      Hgb 16.1 g/dL      Hematocrit 78.4 %      Platelets 128 x10 3/uL      RBC 5.22 x10 6/uL      MCV 95.4 fL      MCH 30.8 pg      MCHC 32.3 g/dL      RDW 14 %      MPV 9.8 fL      Neutrophils 84.6 %      Lymphocytes Automated 6.4 %      Monocytes 8.2 %      Eosinophils Automated 0.2 %      Basophils Automated 0.4 %      Immature Granulocytes 0.2 %      Nucleated RBC 0.0 /100 WBC      Neutrophils Absolute 7.14 x10 3/uL      Lymphocytes Absolute Automated 0.54 x10 3/uL      Monocytes Absolute Automated 0.69 x10 3/uL      Eosinophils Absolute Automated 0.02 x10 3/uL      Basophils Absolute Automated 0.03 x10 3/uL      Immature Granulocytes Absolute 0.02 x10 3/uL      Absolute NRBC 0.00 x10 3/uL     Narrative:      This is NOT the correct Test for Patients with  Hemoglobinopathy.    COVID-19 (SARS-CoV-2) [[id number]] Collected: 12/01/20 0136    Specimen: Nasopharyngeal Swab from Nasopharynx Updated: 12/01/20 0225     Purpose of COVID testing Screening     SARS-CoV-2 Specimen Source Nasopharyngeal     SARS CoV 2 Overall Result Negative    Narrative:      o Collect and  clearly label specimen type:  o Upper respiratory specimen: One Nasopharyngeal Dry Swab NO  Transport Media.  o Hand deliver to laboratory ASAP  Indication for testing->Extended care facility admission to  semi private room  Screening    Glucose Whole Blood - POCT [[id number]]  (Abnormal) Collected: 12/01/20 0037     Updated: 12/01/20 0040     Whole Blood Glucose POCT 147 mg/dL     Urinalysis Reflex to Microscopic Exam- Reflex to Culture [[id number]]  (Abnormal) Collected: 11/30/20 2101    Specimen: Urine, Clean Catch Updated: 11/30/20 2159     Urine  Type Urine, Clean Ca     Color, UA Amber     Clarity, UA Clear     Specific Gravity UA 1.027     Urine pH 5.0     Leukocyte Esterase, UA Negative     Nitrite, UA Negative     Protein, UR Negative     Glucose, UA >=500     Ketones UA 5     Urobilinogen, UA 2.0 mg/dL      Bilirubin, UA Negative     Blood, UA Negative    Glucose Whole Blood - POCT [[id number]]  (Abnormal) Collected: 11/30/20 2103     Updated: 11/30/20 2105     Whole Blood Glucose POCT 229 mg/dL     Troponin I [[id number]] Collected: 11/30/20 1847    Specimen: Blood Updated: 11/30/20 2001     Troponin I 0.02 ng/mL     Comprehensive metabolic panel [[id number]]  (Abnormal) Collected: 11/30/20 1847    Specimen: Blood Updated: 11/30/20 1955     Glucose 168 mg/dL      BUN 29.5 mg/dL      Creatinine 1.4 mg/dL      Sodium 284 mEq/L      Potassium 4.1 mEq/L      Chloride 101 mEq/L      CO2 17 mEq/L      Calcium 8.2 mg/dL      Protein, Total 5.7 g/dL      Albumin 3.4 g/dL      AST (SGOT) 22 U/L      ALT 22 U/L      Alkaline Phosphatase 42 U/L      Bilirubin, Total 0.6 mg/dL      Globulin 2.3 g/dL      Albumin/Globulin Ratio 1.5     Anion Gap 14.0    GFR [[id number]] Collected: 11/30/20 1847     Updated: 11/30/20 1955     EGFR 49.3    Prothrombin time/INR [[id number]] Collected: 11/30/20 1847    Specimen: Blood Updated: 11/30/20 1944     PT 11.2 sec      PT INR 0.9    CBC and differential [[id number]]  (Abnormal) Collected:  11/30/20 1847    Specimen: Blood Updated: 11/30/20 1942     WBC 9.77 x10 3/uL      Hgb 15.7 g/dL      Hematocrit 38.7 %      Platelets 158 x10 3/uL      RBC 5.19 x10 6/uL      MCV 94.6 fL      MCH 30.3 pg      MCHC 32.0 g/dL      RDW 14 %      MPV 10.3 fL      Neutrophils 89.1 %      Lymphocytes Automated 4.0 %      Monocytes 6.1 %      Eosinophils Automated 0.2 %      Basophils Automated 0.2 %      Immature Granulocytes 0.4 %      Nucleated RBC 0.0 /100 WBC      Neutrophils Absolute 8.70 x10 3/uL      Lymphocytes Absolute Automated 0.39 x10 3/uL      Monocytes Absolute Automated 0.60 x10 3/uL      Eosinophils Absolute Automated 0.02 x10 3/uL      Basophils Absolute Automated 0.02 x10 3/uL      Immature Granulocytes Absolute 0.04 x10 3/uL      Absolute NRBC 0.00 x10  3/uL     Glucose Whole Blood - POCT [[id number]] Collected: 11/30/20 1752     Updated: 11/30/20 1754     Whole Blood Glucose POCT 76 mg/dL     Glucose Whole Blood - POCT [[id number]] Collected: 11/30/20 1558     Updated: 11/30/20 1604     Whole Blood Glucose POCT 86 mg/dL             Radiology:     Radiology Results (24 Hour)     Procedure Component Value Units Date/Time    MR Angiogram Head WO Contrast [[id number]] Collected: 12/01/20 0748    Order Status: Completed Updated: 12/01/20 0759    Narrative:      HISTORY: CVA.    COMPARISON: None.    TECHNIQUE:  An MR angiogram of the neck was obtained without and with IV  contrast, and an MR angiogram of the intracranial circulation was  obtained without IV contrast.  The patient received an intravenous  injection of 20 mL of Clariscan contrast material.    Any proximal internal carotid artery narrowing was determined utilizing  the distal internal carotid artery as a reference, similar to the NASCET  methodology.    FINDINGS: Some of the images are degraded by patient motion. The origins  of the great vessels at the aortic arch are patent. The cervical  vertebral arteries are patent. The left vertebral artery  is dominant.  The common carotid arteries are patent. There is irregularity of the  carotid bifurcations, likely on the basis of underlying atherosclerotic  plaque. There is no stenosis involving the carotid bifurcations or  cervical internal carotid arteries.    The intracranial portions of the internal carotid arteries are patent.  The anterior, middle and posterior cerebral arteries are patent. The  basilar artery and intracranial portions of the vertebral arteries are  patent. No intracranial aneurysm is seen.      Impression:        1. There is no stenosis involving the cervical portions of the carotid  or vertebral arteries.  2. There is no stenosis involving the intracranial arterial vasculature.    Georgann Housekeeper, MD   12/01/2020 7:57 AM    MR Angiogram Neck W WO Contrast [[id number]] Collected: 12/01/20 0748    Order Status: Completed Updated: 12/01/20 0759    Narrative:      HISTORY: CVA.    COMPARISON: None.    TECHNIQUE:  An MR angiogram of the neck was obtained without and with IV  contrast, and an MR angiogram of the intracranial circulation was  obtained without IV contrast.  The patient received an intravenous  injection of 20 mL of Clariscan contrast material.    Any proximal internal carotid artery narrowing was determined utilizing  the distal internal carotid artery as a reference, similar to the NASCET  methodology.    FINDINGS: Some of the images are degraded by patient motion. The origins  of the great vessels at the aortic arch are patent. The cervical  vertebral arteries are patent. The left vertebral artery is dominant.  The common carotid arteries are patent. There is irregularity of the  carotid bifurcations, likely on the basis of underlying atherosclerotic  plaque. There is no stenosis involving the carotid bifurcations or  cervical internal carotid arteries.    The intracranial portions of the internal carotid arteries are patent.  The anterior, middle and posterior cerebral arteries are  patent. The  basilar artery and intracranial portions of the vertebral arteries are  patent. No intracranial aneurysm is seen.      Impression:        1. There is no stenosis involving the cervical portions of the carotid  or vertebral arteries.  2. There is no stenosis involving the intracranial arterial vasculature.    Georgann Housekeeper, MD   12/01/2020 7:57 AM    MRI Brain W WO Contrast [[id number]] Collected: 12/01/20 0741    Order Status: Completed Updated: 12/01/20 0750    Narrative:      History: TIA.    COMPARISON: CT of the brain dated 11/30/2020.    TECHNIQUE: Multiplanar MR imaging of the brain was performed on a 1.5  Tesla MRI scanner. Images were acquired prior to and following the  intravenous administration of 20 cc of Clariscan.    FINDINGS: There are mild foci of T2 and FLAIR hyperintense signal within  the cerebral white matter which are nonspecific but likely reflect  chronic small vessel ischemic change in a patient of this age. 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 There  is no restricted diffusion to suggest acute infarction. There is no  evidence of acute intracranial hemorrhage. There is diffuse cerebral  volume loss. There is no hydrocephalus. There is no mass effect or  midline shift. No extra-axial collections are seen. No areas of abnormal  enhancement are seen within the brain on the postcontrast images. The  corpus callosum is normally formed. The sella is unremarkable. The  cerebellar tonsils are normally positioned. The major intracranial flow  voids are preserved. There are bilateral lens implants. Mild mucosal  thickening is seen within the paranasal sinuses.      Impression:        1. No acute intracranial abnormality.  2. Diffuse cerebral volume loss and chronic small vessel ischemic  changes.    Georgann Housekeeper, MD   12/01/2020 7:47 AM    XR Chest  AP Portable [[id number]] Collected: 11/30/20 1704    Order Status: Completed Updated: 11/30/20 1716     Narrative:      HISTORY: Fall, confusion.    COMPARISON: None.    TECHNIQUE: AP chest radiograph.    FINDINGS:    There is no focal consolidation, pneumothorax or pleural effusion. The  cardiomediastinal silhouette is suboptimally assessed on this AP view.  Left basilar linear atelectasis is noted. No acute osseous abnormality  is seen.      Impression:          Left basilar linear atelectasis.    Nonda Lou, MD   11/30/2020 5:14 PM    CT Head without Contrast [[id number]] Collected: 11/30/20 1656    Order Status: Completed Updated: 11/30/20 1701    Narrative:      HISTORY: Pain after fall. Confusion.    COMPARISON: None.    TECHNIQUE: Noncontrast CT of the head. The following dose reduction  techniques were utilized: automatic exposure control and/or adjustment  of mA and/or kV according to patient size, and the use of iterative  reconstruction technique.    FINDINGS: There are hypoattenuating areas within the subcortical and  periventricular white matter which are nonspecific in nature but likely  reflect chronic small vessel ischemic disease in a patient of this age.  There is no evidence of acute territorial infarction or intracranial  hemorrhage. The ventricles and sulci are proportionately prominent,  reflecting diffuse cerebral volume loss. There is no mass-effect or  midline shift. No extra-axial collections are seen. Intracranial  vascular calcifications are seen.. There are bilateral lens implants.  The visualized portions of the paranasal sinuses and mastoid air cells  are clear.      Impression:       No acute intracranial abnormality. Diffuse cerebral volume  loss and chronic small vessel ischemic changes.     Georgann Housekeeper, MD   11/30/2020 4:59 PM          Lines:    Patient Lines/Drains/Airways Status     Active PICC Line / CVC Line / PIV Line / Drain / Airway / Intraosseous Line / Epidural Line / ART Line / Line / Wound / Pressure Ulcer / NG/OG Tube     Name Placement date Placement time Site Days     Peripheral IV 11/30/20 18 G Right Antecubital 11/30/20  1607  Antecubital  less than 1                  Assessment:     Patient Active Problem List   Diagnosis    AMS (altered mental status)       75 year old man with history of type 2 diabetes with insulin pump, hyperlipidemia, hearing impairment was brought into the hospital because of confusion and fever, suspecting metabolic encephalopathy.  Plan:   Encephalopathy  - CTH was negative for acute findings  - brain MRI WWO, brain MRA wo, neck MRA WWO normal  - check UA, Utox, TSH, ammonia, Vitamin D, Vitamin B12, folic acid, B1, COVID test (negative)  - PT/OT to eval for weight bearing status and functional activities. SLP for cog eval  - consulted gen neuro     Fever: Tmax 101.4 this am  - fever work up including UA, CXR, blood cultures  - diarrhea: send for c diff, stool cultures  - consulted ID    CKD stage 3a with metabolic acidosis  - consistent with diarrhea with CKD, change IVF to bicarb w IVF    IDDM  - A1C is 7.3  - on insulin pump  - cw SSI, consulted endo for insulin management    HLD, hearing impairment: noted, cw home meds    VTE ppx: scd, lovenox    Anticipated discharge disposition and date: 1-2days    Signed by: Durene Romans, DO, FACP  cc:Pcp, None, MD

## 2020-12-01 NOTE — OT Eval Note (Signed)
[facility]   Occupational Therapy Evaluation     Patient: Kent [name]    MRN#: [id number]   Unit: [facility] TOWER 6 EAST  Bed: V409/W119.14                                     Post Acute Care Therapy Recommendations:   Discharge Recommendations: Acute Rehab   Patient anticipated to benefit from and to be able to engage in 3 hours of therapy a day for 5 days a week.   Milestones to be reached to achieve recommendation: none  Anticipate achievement in NA sessions    DME Recommended for Discharge: Front wheel walker,BSC,Tub transfer bench    If Acute Rehab  recommended discharge disposition is not available, patient will need modA hands on  assist for ADL functional transfers, balance, activity tolerance with above,  equipment, and Home OT.     Therapy discharge recommendations may change with patient status.  Please refer to most recent note for up-to-date recommendations.    Assessment:   Significant Findings: NA    Kent [name] is a 75 y.o. male admitted 11/30/2020.  Patient presents with AMS ,presents with [facility] strength and coordination both UEs, decreased ADL functional mobility ,balance, activity tolerance . Pt may benefit from OT intervention.         Therapy Diagnosis: decreased balance    Rehabilitation Potential: good for set goals     Treatment Activities: initial eval. Pt walked to the bathroom with min assist with wide base of support,compelte toilet transfer with with assist ,pt completed hand hygiene with assist for balance.   Educated the patient to role of occupational therapy, plan of care, goals of therapy and safety with mobility and ADLs.    Plan:   OT Frequency Recommended: 4-5x/wk     Treatment/Interventions: ADL training,Functional transfer training,Therapeutic exercises,Therapeutic activities,Neuromuscular reeducation techniques.    Risks/benefits/POC discussed yes       Precautions and Contraindications:   PMP,fall     Consult received for Kent [name] for OT Evaluation and Treatment.  Patients medical condition is appropriate for Occupational Therapy intervention at this time.      History of Present Illness:    Kent [name] is a 75 y.o. male admitted on 11/30/2020 with Kent [name] is a 75 y.o. male with h/o type 2 DM treated with insulin pump + oral medications, HLD, and hearing impairment who was brought to ER with chief complaint of confusion.   His wife states they are here visiting family and were supposed to drive back to [place] today. Patient was doing well in the morning except that he had some diarrhea. He was carrying a load to his car later when he fell on a neighbor's drive way. Apparently, he was confused and walked to the wrong house. He fell backward but denied hitting his head (the neighbor also didn't think patient hit his head). He looked pale and confused when he returned home. Wife checked his glucose and it was 184, then went higher to 275 after he ate. He took a nap and was progressively more confused when he got up. He was saying things that did not make sense. He was also generally very weak and unable to get up. He urinated on himself. She decided to call 911 at that point. She states he had low grade fever (temp  100F) at home. No headache, cough, congestion, chest pain, abdominal pain, nausea/vomiting.   The wife states patient has become less confused since arrival to ED.   At baseline, patient has no memory problem, does not get confused, and has no problem with gait.   He is vaccinated for COVID-19, and had a booster in Sept/2021.   His wife was positive for COVID-19 at the end of December but has tested negative since then.     Patient is awake and answering questions appropriately. However, difficult to get detailed history from him since he did not come with his hearing aide.   Per H&P  Admitting Diagnosis: Generalized weakness [R53.1]  Fall, initial encounter [W19.XXXA]  AMS (altered mental status)  [R41.82]    Past Medical/Surgical History:  Past Medical History:   Diagnosis Date    Hyperlipidemia     Type 2 diabetes mellitus      History reviewed. No pertinent surgical history.      Imaging/Tests/Labs:  CT Head without Contrast    Result Date: 11/30/2020   No acute intracranial abnormality. Diffuse cerebral volume loss and chronic small vessel ischemic changes. Georgann Housekeeper, MD  11/30/2020 4:59 PM    MR Angiogram Head WO Contrast    Result Date: 12/01/2020  1. There is no stenosis involving the cervical portions of the carotid or vertebral arteries. 2. There is no stenosis involving the intracranial arterial vasculature. Georgann Housekeeper, MD  12/01/2020 7:57 AM    MR Angiogram Neck W WO Contrast    Result Date: 12/01/2020  1. There is no stenosis involving the cervical portions of the carotid or vertebral arteries. 2. There is no stenosis involving the intracranial arterial vasculature. Georgann Housekeeper, MD  12/01/2020 7:57 AM    MRI Brain W WO Contrast    Result Date: 12/01/2020  1. No acute intracranial abnormality. 2. Diffuse cerebral volume loss and chronic small vessel ischemic changes. Georgann Housekeeper, MD  12/01/2020 7:47 AM    XR Chest  AP Portable    Result Date: 11/30/2020  Left basilar linear atelectasis. Nonda Lou, MD  11/30/2020 5:14 PM      Social History:   Prior Level of Function: independent  Assistive Devices: none  Baseline Activity: community ambulation, drives,   DME Currently at Home: none  Home Living Arrangements: lives with wife in [place] daughter here.  Type of Home: House  Home Layout: steps     Subjective:I feel stiff and tired     Patient is agreeable to participation in the therapy session. Nursing clears patient for therapy.     Patient Goal: get stronger and walk   Pain:   Scale: soreness  Location: generalized  Intervention: RN aware    Objective:   Patient is in bed with telemetry and IV  in place.  Pt wore mask during therapy session:Yes    Cognitive Status and Neuro  Exam:  Alert and oriented followed simple directions followed simple directions     Musculoskeletal Examination  RUE ROM: WFL  LUE ROM: WFL  RLE ROM: WFL  LLE ROM: WFL    RUE Strength: WFL  LUE Strength: WFL  RLE Strength: WFL  LLE Strength: WFL      Sensory/Oculomotor Examination  Auditory: HOH able to hear better from left side ,does not have hearing aids with him  Tactile: intact  Vision: denies vision changes      Activities of Daily Living  Eating: hand to mouth I  Grooming: simple grooming  with supervision  Bathing: UB min A LB mod/maxA  UE Dressing: gown supervision  LE Dressing: maxA  Toileting: supervision    Functional Mobility:  Supine to Sit: minA  Sit to Stand: minA from higher surface  Transfers: minA  With wide base of support    PMP Activity: Step 7 - Walks out of Room      Balance  Static Sitting: good  Dynamic Sitting: good  Static Standing: fair+ stands with wide base of support  Dynamic Standing: NT    Participation and Activity Tolerance  Participation Effort: good  Endurance: fair    Patient left with call bell within reach, all needs met, SCDs not in use RN aware, fall mat in place, bed alarm on, chair alarm NA and all questions answered. RN notified of session outcome and patient response.       Goals:  Time For Goal Achievement: 7 visits  ADL Goals  Patient will dress upper body: Independent,7 visits,without AE  Patient will dress lower body: Supervision,7 visits,without AE  Mobility and Transfer Goals  Pt will perform functional transfers: Modified Independent,with rolling walker,7 visits  Neuro Re-Ed Goals  Pt will perform dynamic standing balance: Modified Independent,to complete standing ADLs safely,for 15 minutes,7 visits  Musculoskeletal Goals  Other Goal: increase activity tolerance to good                      PPE worn during session: procedural mask, face shield and gloves    Tech present: NA  PPE worn by tech: N/A        Ander Purpura, MA/OTR, CSRS, LSVT-BIG  [phone]    Time of  treatment:   OT Received On: 12/01/20  Start Time: 9147  Stop Time: 1030  Time Calculation (min): 45 min

## 2020-12-01 NOTE — PT Eval Note (Signed)
[name]   Physical Therapy Evaluation   Patient: Kent Kent [name]    MRN#: [id number]   Unit: [facility] TOWER 6 EAST  Bed: V409/W119.14      Post Acute Care Therapy Recommendations:   Discharge Recommendations: Acute Rehab (Likely will improve to home with HHPT)   Patient anticipated to benefit from and to be able to engage in 3 hours of therapy a day for 5 days a week.     Milestones to be reached to achieve recommendation: None  Anticipate achievement in n/a sessions    DME Recommended for Discharge:  (TBD)    If Acute Rehab (Likely will improve to home with HHPT) recommended discharge disposition is not available, patient will need 1 person assist for mobility and care, RW and shower chair equipment, and HHPT.     Therapy discharge recommendations may change with patient status.  Please refer to most recent note for up-to-date recommendations.    Assessment:   Significant Findings: None    Kent Kent [name] is a 75 y.o. male admitted 11/30/2020 s/p a fall while loading items into the car in the driveway. Sitting balance is difficult without UE support, with patient demonstrating decreased ability to right himself; had one posterior LOB in sitting. Required VC for hand placement for sit>stand rather than utilize Kent [name]; Kent [name] responded well and was able to incorporate cues. In standing, Kent [name] demonstrated increased trunk sway when dynamic balance was assessed with EC and narrow BOS. Upon initiation of movement in standing, Kent [name] had one LOB requiring minA for recovery. Kent [name] was able to perform hygiene at the sink, bend forward to pick objects off the ground and reach overhead without any further LOBs. Demonstrates wide BOS, tendency to look down towards ground, and decreased foot clearance, potentially due to peripheral neuropathy. Educated Kent [name]/family on safety awareness, energy conservation techniques, potential DME, and reducing IADL tasks (mowing, gardening, cleaning) initially. Kent [name] will  benefit from intensive Kent [name] to maximize safety and independence.     Impairments: Assessment: Decreased safety/judgement during functional mobility;Decreased endurance/activity tolerance;Decreased sensation;Impaired coordination;Impaired motor control;Decreased functional mobility;Decreased balance;Gait impairment.     Therapy Diagnosis: gait instability, balance impairments    Rehabilitation Potential: Prognosis: Good;With continued Kent [name] status post acute discharge    Treatment Activities: Evaluation, gait training, NMR, Kent [name]/family education    Educated the patient to role of physical therapy, plan of care, goals of therapy and safety with mobility and ADLs, energy conservation techniques, home safety.    Plan:   Treatment/Interventions: Exercise,Gait training,Stair training,Neuromuscular re-education,Functional transfer training,Endurance training,Patient/family training,Bed mobility     Kent [name] Frequency: 4-5x/wk   Risks/Benefits/POC Discussed with Kent [name]/Family: With patient/family        Precautions and Contraindications:   Other Precautions: Falls, HOH    Consult received for Kent Kent [name] Evaluation and Treatment.  Patients medical condition is appropriate for Physical therapy intervention at this time.    Medical Diagnosis: Generalized weakness [R53.1]  Fall, initial encounter [W19.XXXA]  AMS (altered mental status) [R41.82]      History of Present Illness:   Kent Kent [name] is a 75 y.o. male admitted on 11/30/2020 with h/o type 2 DM treated with insulin pump + oral medications, HLD, and hearing impairment who was brought to ER with chief complaint of confusion.   His wife states they are here visiting family and were supposed to drive back to [place] today. Patient was doing well in the morning except that he had some diarrhea.  He was carrying a load to his car later when he fell on a neighbor's drive way. Apparently, he was confused and walked to the wrong house. He fell backward but denied  hitting his head (the neighbor also didn't think patient hit his head). He looked pale and confused when he returned home. Wife checked his glucose and it was 184, then went higher to 275 after he ate. He took a nap and was progressively more confused when he got up. He was saying things that did not make sense. He was also generally very weak and unable to get up. He urinated on himself. She decided to call 911 at that point. She states he had low grade fever (temp 100F) at home. No headache, cough, congestion, chest pain, abdominal pain, nausea/vomiting.   The wife states patient has become less confused since arrival to ED. At baseline, patient has no memory problem, does not get confused, and has no problem with gait.   -per MD note      Past Medical/Surgical History:  Past Medical History:   Diagnosis Date    Hyperlipidemia     Type 2 diabetes mellitus      History reviewed. No pertinent surgical history.     X-Rays/Tests/Labs:  CT Head without Contrast    Result Date: 11/30/2020   No acute intracranial abnormality. Diffuse cerebral volume loss and chronic small vessel ischemic changes. Georgann Housekeeper, MD  11/30/2020 4:59 PM    MR Angiogram Head WO Contrast    Result Date: 12/01/2020  1. There is no stenosis involving the cervical portions of the carotid or vertebral arteries. 2. There is no stenosis involving the intracranial arterial vasculature. Georgann Housekeeper, MD  12/01/2020 7:57 AM    MR Angiogram Neck W WO Contrast    Result Date: 12/01/2020  1. There is no stenosis involving the cervical portions of the carotid or vertebral arteries. 2. There is no stenosis involving the intracranial arterial vasculature. Georgann Housekeeper, MD  12/01/2020 7:57 AM    MRI Brain W WO Contrast    Result Date: 12/01/2020  1. No acute intracranial abnormality. 2. Diffuse cerebral volume loss and chronic small vessel ischemic changes. Georgann Housekeeper, MD  12/01/2020 7:47 AM    XR Chest  AP Portable    Result Date: 11/30/2020  Left basilar  linear atelectasis. Nonda Lou, MD  11/30/2020 5:14 PM    Social History:   Prior Level of Function:  Prior level of function: Independent with ADLs,Ambulates independently  Baseline Activity Level: Community ambulation  Driving: independent  DME Currently at Home:  (None)    Home Living Arrangements:  Living Arrangements: Spouse/significant other  Type of Home: House  Home Layout: One level (3 STE with rail)  Bathroom Equipment: Grab bars in shower  DME Currently at Home:  (None)     Kent [name] lives in [place], [place]. Daughter is in the area - house has 1 FOS with rail to get to the main floor. Kent [name] would prefer to d/c home to [place].    Subjective: I feel really warm - do I have a fever? Do you know my prognosis?   Patient is agreeable to participation in the therapy session. Nursing clears patient for therapy.     Patient Goal: to go home    Pain Assessment  Pain Assessment: No/denies pain    Objective:   Observation of Patient/Vital Signs:  Patient is in bed with telemetry in place.  Kent [name] wore mask during therapy session:Yes  Observation of Patient/Vital signs:  Inspection/Posture: tends to look down towards ground, post trunk lean in sitting    Cognition/Neuro Status  Arousal/Alertness: Appropriate responses to stimuli  Attention Span: Appears intact  Orientation Level: Oriented X4  Memory: Appears intact (able to repeat 3 words (house, ball, red))  Following Commands: Follows multistep commands with repetition  Safety Awareness: minimal verbal instruction  Insights: Decreased awareness of deficits;Educated in safety awareness  Problem Solving: Able to problem solve independently  Behavior: calm;cooperative  Motor Planning: intact  Coordination: intact    Sensation: intact to LT- possibly dec protective sensation on bil plantar surfaces of feet 2/2 diabetic neuropathy. Kent [name] denies numbness, however neuropathic gait demonstrated.     Musculoskeletal Examination:  Gross ROM  Right Upper Extremity ROM: within functional  limits  Left Upper Extremity ROM: within functional limits  Right Lower Extremity ROM: within functional limits  Left Lower Extremity ROM: within functional limits    Gross Strength  Right Upper Extremity Strength: 5/5  Left Upper Extremity Strength: 5/5  Right Lower Extremity Strength: 5/5  Left Lower Extremity Strength: 5/5 (required inc cues on L for engagement)         Functional Mobility:  Supine to Sit: Stand by Assist;Minimal Assist (first supine>sit SBA, second supine>sit minA after LOB)  Scooting to EOB: Contact Guard Assist;Stand by Assist;Increased Effort;Increased Time (required inc cueing to scoot fwd to EOB instead of laterally)  Sit to Stand: Contact Guard Assist (required VC for hand placement on bed instead of grabbing Kent [name])  Stand to Sit: Stand by Assist;Contact Guard Assist         Ambulation:  PMP - Progressive Mobility Protocol   PMP Activity: Step 6 - Walks in Room  Distance Walked (ft) (Step 6,7): 30 Feet (30 ft (1x10 ft bed>bathroom, 1x20 ft bathroom>chair))     Ambulation: Contact Guard Assist;Minimal Assist (minA for LOB at initial first step; CGA with HHA)  Pattern: R foot decreased clearance;L foot decreased clearance;R foot flat;L foot flat;decreased cadence;decreased step length;Wide BOS      Balance:  Sitting - Static: Fair (post trunk lean; had 1 LOB in bed when not using UE support)  Standing - Static: Fair (wide BOS)  Standing - Dynamic:  (assessed EO/EC with narrow BOS; inc difficulty/sway with EC)   Kent [name] able to bend forward to pick up object off ground and reach overhead without LOB. Able to turn 360 degrees without dizziness, confusion or LOB.           Participation and Activity Tolerance:  Participation Effort: good  Endurance: Tolerates 10 - 20 min exercise with multiple rests      Patient left with call bell within reach, all needs met, fall mat in place, chair alarm on and all questions answered. RN notified of session outcome and patient response.       Goals:   Goals  Goal  Formulation: With patient  Time for Goal Acheivement: 5 visits  Kent [name] Will Achieve Sitting Balance: 4+/5 moves/returns trunkal midpoint 1-2 inches in multiple planes,to maximize functional mobility and independence,by time of discharge (without posterior LOB)  Kent [name] Will Demo Standing Balance: to maximize functional mobility and independence,by time of discharge,4+/5 indep, moves/returns center of grav in multiple planes 1-2"  Kent [name] Will Ambulate: 101-150 feet,with stand by assist,to maximize functional mobility and independence,by time of discharge  Kent [name] Will Go Up / Down Stairs: 3-5 stairs,with stand by assist,With rail,to maximize functional mobility and independence,by time of discharge  PPE worn during session: procedural mask, goggles and gloves    Tech present: Renato Gails, DPT  PPE worn by tech: procedural mask, goggles and gloves    Drue Second, SPT 12/01/2020 4:33 PM    Supervising therapist present in the room, guiding and directing the student in one on one service to the patient.  This supervising therapist is making the skilled judgment, and is responsible for the assessment and treatment of the patient.    Renato Gails, Kent [name] Kent [name], DPT 12/01/2020 4:36 PM  Pager# [phone]    Time of treatment:   Kent [name] Received On: 12/01/20  Start Time: 1350  Stop Time: 1430  Time Calculation (min): 40 min

## 2020-12-01 NOTE — Consults (Signed)
INFECTIOUS DISEASES CONSULTATION NOTE    Date Time: 12/01/20 12:24 PM  Patient Name: Norwood Levo  Requesting Physician: Durene Romans, DO       Reason for Consultation:   Fever, diarrhea    History:   Senai Kingsley is a 75 y.o. male with diabetes (A1C 7.8) with insulin pump, HLD, HTN, hard of hearing who presented to the hospital on 11/30/2020 with confusion.    COVID vaccinated including booster    He lives in [place] and was here visiting family (traveled by car with his wife).     Had fall in driveway, confusion.  Low grade fever.  ID consults for fever evaluation, antibiotic recommendations    At the moment he has generalized weakness and recent diarrhea - no other symptoms  No pain from fall.  Diarrhea twice so far today, loose but not watery  No fever at home  Had one episode of diarrhea yesterday at home but none prior  No ab pain or cramping    Past Medical History:     Past Medical History:   Diagnosis Date    Hyperlipidemia     Type 2 diabetes mellitus        Past Surgical History:   History reviewed. No pertinent surgical history.    Family History:   History reviewed. No pertinent family history.    Social History:     Social History     Socioeconomic History    Marital status: Married     Spouse name: Not on file    Number of children: Not on file    Years of education: Not on file    Highest education level: Not on file   Occupational History    Not on file   Tobacco Use    Smoking status: Former Smoker    Smokeless tobacco: Never Used   Substance and Sexual Activity    Alcohol use: Yes     Comment: 2 beers a month    Drug use: Not on file    Sexual activity: Not on file   Other Topics Concern    Not on file   Social History Narrative    Not on file     Social Determinants of Health     Financial Resource Strain:     Difficulty of Paying Living Expenses: Not on file   Food Insecurity:     Worried About Running Out of Food in the Last Year: Not on file    [employer] of  Food in the Last Year: Not on file   Transportation Needs:     Lack of Transportation (Medical): Not on file    Lack of Transportation (Non-Medical): Not on file   Physical Activity:     Days of Exercise per Week: Not on file    Minutes of Exercise per Session: Not on file   Stress:     Feeling of Stress : Not on file   Social Connections:     Frequency of Communication with Friends and Family: Not on file    Frequency of Social Gatherings with Friends and Family: Not on file    Attends Religious Services: Not on file    Active Member of Clubs or Organizations: Not on file    Attends Banker Meetings: Not on file    Marital Status: Not on file   Intimate Partner Violence:     Fear of Current or Ex-Partner: Not on file  Emotionally Abused: Not on file    Physically Abused: Not on file    Sexually Abused: Not on file   Housing Stability:     Unable to Pay for Housing in the Last Year: Not on file    Number of Places Lived in the Last Year: Not on file    Unstable Housing in the Last Year: Not on file       Antibiotics:   none    Medications:     Current Facility-Administered Medications   Medication Dose Route Frequency    clopidogrel  75 mg Oral Daily    enoxaparin  40 mg Subcutaneous Daily    fenofibrate micronized  134 mg Oral QAM AC    insulin lispro  1-3 Units Subcutaneous QHS    insulin lispro  1-5 Units Subcutaneous TID AC    rosuvastatin  40 mg Oral QHS       Allergies:   No Known Allergies    Review of Systems:   GEN: No fever, chills, diaphoresis, weight change  Eyes: no blurred or double vision  HENT: no tinnitus, sore throat, sinus congestion or pain  CVS: no chest pain or palpitations  RESP: no sob, cough or wheezing  ABD: no pain, nausea, vomiting, diarrhea, constipation  GU: no dysuria, frequency, hematuria or suprapubic pain  EXT: no edema  Skin: no rash or itching  MSK: no joint pain or swelling  Neuro: no weakness, numbness or tingling. No headache.    Physical  Exam:     Vitals:    12/01/20 1123   BP: 108/68   Pulse: 100   Resp: 19   Temp: 98.2 F (36.8 C)   SpO2: 95%     Vital signs reviewed    GEN: NAD. appears stated age. Appears comfortable in bed.  HEENT: Normal oropharynx, anicteric  CV: normal S1S2, RR, no MGR  PULM: CTAB, no wheezing  ABD: NABS, soft, nontender, nondistended, no HSM  GU: no foley  EXT: no edema BLE  NEURO: grossly intact  DERM: No rashes or ulcers. No jaundice.  MSK: No warmth or effusions in the bilateral wrists, knees, ankles    Lines:    Central Line: no  PIV without erythema or tenderness    Microbiology:    Blood cx 1/19 pend  COVID 1/19 NG  Cdiff 1/19 pend    All microbiology results reviewed.     Labs:    Lab results reviewed.  Recent Labs     12/01/20  0431   WBC 8.44   Hgb 16.1   Hematocrit 49.8*   Platelets 128*       Recent CMP   Recent Labs     12/01/20  0431   Glucose 158*   BUN 22.0   Creatinine 1.3   Sodium 133*   Potassium 4.0   Chloride 103   CO2 15*       Rads:   MRI Brain  1. No acute intracranial abnormality.   2. Diffuse cerebral volume loss and chronic small vessel ischemic   changes.     CXR 1/18  Left basilar linear atelectasis.     All radiology results reviewed  Assessment:   Mr Stooksbury is a 75 y.o. male with diabetes (A1C 7.3) with insulin pump, HLD, HTN, hard of hearing     COVID vaccinated including booster    Lives in [place] and was here visiting family (traveled by car with his wife).  Admitted 11/30/2020 with confusion s/p fall    Fever  Mild leukocytosis - resolved  Mild thrombocytosis  Cr 1.3  No pyuria  CXR with atelectasis  Hyperglycemia/glucosuria     Source of fever unclear. Possible gastroenteritis (would likely be viral). Cdiff seems unlikely. No signs of hematoma or rhabdo s/p recent fall.    Plan:   Fever  Blood cultures pending  Cdiff and stool PCR pending  Would monitor off antibiotic at this point  PT/OT following for weakness    Discussed with patient and Dr Annell Greening    Thanks for consult. Will  follow  Total time: 40 min    Lavona Mound  [facility]    [phone]  Pager: [phone]

## 2020-12-01 NOTE — Progress Notes (Signed)
12/01/20 1112   CM Review   Acknowledgment of Outpatient/Observation Observation letter given     CMA called and left a voice message regarding patient hospital observation status. Mailing letter to address on file.    Leone Payor   Case Management Assistant  Case Management Department  [facility]  P: [phone]   [email or web address]

## 2020-12-01 NOTE — Consults (Addendum)
[facility] Consultation Note                                       Date Time: 12/01/20 11:40 AM  Patient Name: Kent [name]  Requesting Physician: Durene Romans, DO  Date of Admission: 11/30/2020    CC / Reason for Consultation: AMS           Assessment:   Rishikesh Khachatryan is a 75 y.o. male with a past medical history significant for hypertension, hyperlipidemia, diabetes and coronary artery disease status post prior stent who presented here on 1/18 with AMS following an unwitnessed fall.      1. AMS/fall - No clinical or radiographic findings to suggest an acute central process. Suspect toxic metabolic in the setting of possible viral infection.     -CT head without contrast is negative for any acute intracranial abnormality. Diffuse cerebral volume loss and chronic small vessel ischemic changes   -MRI brain is negative for any acute intracranial abnormality.  Volume loss and chronic ischemic changes as above   -MRA head neck negative for any hemodynamically significant stenosis   -TSH 0.71, B12 575    2. 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  Clear for discharge from our standpoint when medically stable.  Can pursue neurology follow-up in [place] if symptoms recur    Neurology Attending Addendum:    Patient presents with encephalopathy, now significantly improved. Patient denies any further confusion, though does report feeling fatigued compared to normal. On exam he has some difficulty with serial 7's but able to spell "world" backwards. Remainder of neuro exam with evidence of peripheral neuropathy and otherwise unremarkable. Neck supple. MRI brain without acute findings. Suspect toxic/metabolic encephalopathy secondary to infection given his fever. Spontaneous clinical improvement makes CNS infection unlikely. Plan as  above.    I reviewed this patient's chart and relevant neuro-imaging, laboratory results, and other diagnostic studies. I personally obtained the history, examined the patient, and performed the medical decision-making. I provided a substantive portion of the care of this patient and personally provided more than half of the total time dedicated to treatment of this patient. I agree with the findings and plan as outlined, with any highlights or additions as noted above.    Hazle Nordmann, MD  [facility]    HPI   Kent [name] is a 75 y.o. male with a past medical history significant for hypertension, hyperlipidemia, diabetes and coronary artery disease status post prior stent who presented here on 1/18 following an unwitnessed fall.  History is primarily obtained from the medical record as the patient cannot recall details surrounding his presentation.  Patient and his wife are from [place] and are here in the area visiting family.  They were apparently loading their car to return to [place] when patient fell in the driveway.  Per the wife he was very confused and "walked up to the wrong house."   Patient's mental status apparently worsened throughout the day with low-grade fever, generalized weakness and confused speech.  Confusion apparently improved upon arrival to the ED.  CT head without contrast was negative for any acute intracranial abnormality.  Diffuse cerebral volume loss and chronic small vessel ischemic changes were  noted.  Subsequent MRI brain and MRA head and neck were unremarkable.    Mild leukocytosis to 9.77 upon presentation.  Creatinine 1.4, sodium 132.  UA negative.  COVID-19 negative.  TSH .71, B12 575    Temp to 101.4 this morning.      Past Medical Hx     Past Medical History:   Diagnosis Date    Hyperlipidemia     Type 2 diabetes mellitus           Past Surgical Hx:   History reviewed. No pertinent surgical history.     Family Medical History:    History reviewed. No  pertinent family history.    Social Hx     Social History     Socioeconomic History    Marital status: Married   Tobacco Use    Smoking status: Former Smoker    Smokeless tobacco: Never Used   Substance and Sexual Activity    Alcohol use: Yes     Comment: 2 beers a month       Meds     Home :   Prior to Admission medications    Medication Sig Start Date End Date Taking? Authorizing Provider   clopidogrel (PLAVIX) 75 mg tablet Take 75 mg by mouth daily   Yes [provider]   empagliflozin (Jardiance) 25 MG tablet Take 25 mg by mouth every morning   Yes [provider]   fenofibrate micronized (LOFIBRA) 134 MG capsule Take 134 mg by mouth every morning before breakfast   Yes [provider]   Insulin Infusion Pump (Insulin Pump [id number]) Kit by Does not apply route   Yes [provider]   metFORMIN (GLUCOPHAGE) 850 MG tablet Take 850 mg by mouth 2 (two) times daily with meals   Yes [provider]   pseudoephedrine-acetaminophen (TYLENOL SINUS) 30-500 MG Tab Take 1 tablet by mouth every 4 (four) hours as needed   Yes [provider]   rosuvastatin (CRESTOR) 40 MG tablet Take 40 mg by mouth daily   Yes [provider]      Inpatient :   Current Facility-Administered Medications   Medication Dose Route Frequency    clopidogrel  75 mg Oral Daily    enoxaparin  40 mg Subcutaneous Daily    fenofibrate micronized  134 mg Oral QAM AC    insulin lispro  1-3 Units Subcutaneous QHS    insulin lispro  1-5 Units Subcutaneous TID AC    rosuvastatin  40 mg Oral QHS         Allergies    Patient has no known allergies.      Review of Systems   All other systems were reviewed and are negative except for that mentioned in the HPI    Physical Exam:   Temp:  [97.1 F (36.2 C)-101.4 F (38.6 C)] 98.2 F (36.8 C)  Heart Rate:  [85-116] 100  Resp Rate:  [17-24] 19  BP: (108-133)/(59-69) 108/68     Vital Signs:  Reviewed    General: Well developed and well nourished.  No acute distress. Cooperative with the exam  ENT: Normal oral mucosa, no ear or nose discharge  Neck: Symmetric, no deformities  CV: RRR  Resp: No audible wheezing, normal work of breathing  Abd: Soft, nondistended  Skin: Intact, extremities normal in color  Psych: Affect is normal, good insight    Mental Status: The patient is awake, alert.  He is oriented to person, place, month  and year.  He states it is the 18th.  He knows the president and the vice president.  Affect is normal  Fund of knowledge appropriate  Recent and remote memory are intact   Attention span and concentration appear normal.  Language function is normal. There is no evidence of aphasia in conversational speech.    Cranial nerves:   -CN II: Visual fields full to bedside confrontation   -CN III, IV, VI: Pupils equal, round, and reactive to light; extraocular movements intact; no ptosis              -CN V: Facial sensation intact in V1 through V3 distributions   -CN VII: Face symmetric   -CN VIII: Hearing intact to conversational speech   -CN IX, X: Palate elevates symmetrically; normal phonation   -CN XI: Symmetric full strength of sternocleidomastoid and trapezius muscles   -CN XII: Tongue protrudes midline    Motor: Muscle tone normal without spasticity or flaccidity. No atrophy.  No pronator drift.  Strength          R / L                                         R / L  Deltoid             5 / 5                 Hip Flexion      5 / 5  Triceps            5 / 5                 Hip extension  5 / 5  Biceps             5 / 5                 Knee flexion    5 / 5  Wrist ext          5 / 5                 Knee ext         5 / 5  Wrist flexion    5 / 5                 Dorsiflexion     5 / 5  FF                   5 / 5                 Plantar flexion 5 / 5    Sensory: Light touch and temperature are diminished in a stocking distribution    Reflexes: DTRs are absent, plantars are silent    Coordination: FTN intact without dysmetria. No  tremors    Gait: Deferred secondary to patient's safety concerns        Labs:     Results       Procedure Component Value Units Date/Time    Glucose Whole Blood - POCT [[id number]]  (Abnormal) Collected: 12/01/20 1124     Updated: 12/01/20 1129     Whole Blood Glucose POCT 299 mg/dL     Culture Blood Aerobic and Anaerobic [[id number]] Collected: 12/01/20 0854    Specimen: Blood, Venipuncture Updated: 12/01/20 1005    Narrative:  The order will result in two separate 8-77ml bottles  Please do NOT order repeat blood cultures if one has been  drawn within the last 48 hours  UNLESS concerned for  endocarditis  AVOID BLOOD CULTURE DRAWS FROM CENTRAL LINE IF POSSIBLE  Indications:->Fever of greater than 101.5  1 BLUE+1 PURPLE    Culture Blood Aerobic and Anaerobic [[id number]] Collected: 12/01/20 0854    Specimen: Blood, Venipuncture Updated: 12/01/20 1005    Narrative:      The order will result in two separate 8-13ml bottles  Please do NOT order repeat blood cultures if one has been  drawn within the last 48 hours  UNLESS concerned for  endocarditis  AVOID BLOOD CULTURE DRAWS FROM CENTRAL LINE IF POSSIBLE  Indications:->Fever of greater than 101.5  1 BLUE+1 PURPLE    Glucose Whole Blood - POCT [[id number]]  (Abnormal) Collected: 12/01/20 0746     Updated: 12/01/20 0753     Whole Blood Glucose POCT 246 mg/dL     TSH [[id number]] Collected: 12/01/20 0431    Specimen: Blood Updated: 12/01/20 0734     TSH 0.71 uIU/mL     Narrative:      This is NOT the correct Test for Patients with  Hemoglobinopathy.    Basic Metabolic Panel [[id number]]  (Abnormal) Collected: 12/01/20 0431    Specimen: Blood Updated: 12/01/20 0711     Glucose 158 mg/dL      BUN 62.9 mg/dL      Creatinine 1.3 mg/dL      Calcium 8.0 mg/dL      Sodium 528 mEq/L      Potassium 4.0 mEq/L      Chloride 103 mEq/L      CO2 15 mEq/L      Anion Gap 15.0    Narrative:      This is NOT the correct Test for Patients with  Hemoglobinopathy.    Magnesium [[id number]]  Collected: 12/01/20 0431    Specimen: Blood Updated: 12/01/20 0711     Magnesium 1.9 mg/dL     Narrative:      This is NOT the correct Test for Patients with  Hemoglobinopathy.    GFR [[id number]] Collected: 12/01/20 0431     Updated: 12/01/20 0711     EGFR 53.7    Narrative:      This is NOT the correct Test for Patients with  Hemoglobinopathy.    Vitamin B12 [[id number]] Collected: 12/01/20 0431    Specimen: Blood Updated: 12/01/20 0703     Vitamin B-12 575 pg/mL     Narrative:      This is NOT the correct Test for Patients with  Hemoglobinopathy.    Hemolysis index [[id number]] Collected: 12/01/20 0431     Updated: 12/01/20 0640     Hemolysis Index 21    Narrative:      This is NOT the correct Test for Patients with  Hemoglobinopathy.    Lipid panel [[id number]]  (Abnormal) Collected: 12/01/20 0431    Specimen: Blood Updated: 12/01/20 0640     Cholesterol 93 mg/dL      Triglycerides 88 mg/dL      HDL 26 mg/dL      LDL Calculated 49 mg/dL      VLDL Calculated 18 mg/dL      Cholesterol / HDL Ratio 3.6    Narrative:      This is NOT the correct Test for Patients with  Hemoglobinopathy.    Hemoglobin A1C [[id number]]  (Abnormal) Collected: 12/01/20  1610    Specimen: Blood Updated: 12/01/20 0630     Hemoglobin A1C 7.3 %      Average Estimated Glucose 162.8 mg/dL     Narrative:      This is NOT the correct Test for Patients with  Hemoglobinopathy.    CBC and differential [[id number]]  (Abnormal) Collected: 12/01/20 0431    Specimen: Blood Updated: 12/01/20 0509     WBC 8.44 x10 3/uL      Hgb 16.1 g/dL      Hematocrit 11.9 %      Platelets 128 x10 3/uL      RBC 5.22 x10 6/uL      MCV 95.4 fL      MCH 30.8 pg      MCHC 32.3 g/dL      RDW 14 %      MPV 9.8 fL      Neutrophils 84.6 %      Lymphocytes Automated 6.4 %      Monocytes 8.2 %      Eosinophils Automated 0.2 %      Basophils Automated 0.4 %      Immature Granulocytes 0.2 %      Nucleated RBC 0.0 /100 WBC      Neutrophils Absolute 7.14 x10 3/uL      Lymphocytes Absolute  Automated 0.54 x10 3/uL      Monocytes Absolute Automated 0.69 x10 3/uL      Eosinophils Absolute Automated 0.02 x10 3/uL      Basophils Absolute Automated 0.03 x10 3/uL      Immature Granulocytes Absolute 0.02 x10 3/uL      Absolute NRBC 0.00 x10 3/uL     Narrative:      This is NOT the correct Test for Patients with  Hemoglobinopathy.    COVID-19 (SARS-CoV-2) [[id number]] Collected: 12/01/20 0136    Specimen: Nasopharyngeal Swab from Nasopharynx Updated: 12/01/20 0225     Purpose of COVID testing Screening     SARS-CoV-2 Specimen Source Nasopharyngeal     SARS CoV 2 Overall Result Negative    Narrative:      o Collect and clearly label specimen type:  o Upper respiratory specimen: One Nasopharyngeal Dry Swab NO  Transport Media.  o Hand deliver to laboratory ASAP  Indication for testing->Extended care facility admission to  semi private room  Screening    Glucose Whole Blood - POCT [[id number]]  (Abnormal) Collected: 12/01/20 0037     Updated: 12/01/20 0040     Whole Blood Glucose POCT 147 mg/dL     Urinalysis Reflex to Microscopic Exam- Reflex to Culture [[id number]]  (Abnormal) Collected: 11/30/20 2101    Specimen: Urine, Clean Catch Updated: 11/30/20 2159     Urine Type Urine, Clean Ca     Color, UA Amber     Clarity, UA Clear     Specific Gravity UA 1.027     Urine pH 5.0     Leukocyte Esterase, UA Negative     Nitrite, UA Negative     Protein, UR Negative     Glucose, UA >=500     Ketones UA 5     Urobilinogen, UA 2.0 mg/dL      Bilirubin, UA Negative     Blood, UA Negative    Glucose Whole Blood - POCT [[id number]]  (Abnormal) Collected: 11/30/20 2103     Updated: 11/30/20 2105     Whole Blood Glucose POCT 229 mg/dL     Troponin I [[id number]]  Collected: 11/30/20 1847    Specimen: Blood Updated: 11/30/20 2001     Troponin I 0.02 ng/mL     Comprehensive metabolic panel [[id number]]  (Abnormal) Collected: 11/30/20 1847    Specimen: Blood Updated: 11/30/20 1955     Glucose 168 mg/dL      BUN 40.9 mg/dL       Creatinine 1.4 mg/dL      Sodium 811 mEq/L      Potassium 4.1 mEq/L      Chloride 101 mEq/L      CO2 17 mEq/L      Calcium 8.2 mg/dL      Protein, Total 5.7 g/dL      Albumin 3.4 g/dL      AST (SGOT) 22 U/L      ALT 22 U/L      Alkaline Phosphatase 42 U/L      Bilirubin, Total 0.6 mg/dL      Globulin 2.3 g/dL      Albumin/Globulin Ratio 1.5     Anion Gap 14.0    GFR [[id number]] Collected: 11/30/20 1847     Updated: 11/30/20 1955     EGFR 49.3    Prothrombin time/INR [[id number]] Collected: 11/30/20 1847    Specimen: Blood Updated: 11/30/20 1944     PT 11.2 sec      PT INR 0.9    CBC and differential [[id number]]  (Abnormal) Collected: 11/30/20 1847    Specimen: Blood Updated: 11/30/20 1942     WBC 9.77 x10 3/uL      Hgb 15.7 g/dL      Hematocrit 41.3 %      Platelets 158 x10 3/uL      RBC 5.19 x10 6/uL      MCV 94.6 fL      MCH 30.3 pg      MCHC 32.0 g/dL      RDW 14 %      MPV 10.3 fL      Neutrophils 89.1 %      Lymphocytes Automated 4.0 %      Monocytes 6.1 %      Eosinophils Automated 0.2 %      Basophils Automated 0.2 %      Immature Granulocytes 0.4 %      Nucleated RBC 0.0 /100 WBC      Neutrophils Absolute 8.70 x10 3/uL      Lymphocytes Absolute Automated 0.39 x10 3/uL      Monocytes Absolute Automated 0.60 x10 3/uL      Eosinophils Absolute Automated 0.02 x10 3/uL      Basophils Absolute Automated 0.02 x10 3/uL      Immature Granulocytes Absolute 0.04 x10 3/uL      Absolute NRBC 0.00 x10 3/uL     Glucose Whole Blood - POCT [[id number]] Collected: 11/30/20 1752     Updated: 11/30/20 1754     Whole Blood Glucose POCT 76 mg/dL     Glucose Whole Blood - POCT [[id number]] Collected: 11/30/20 1558     Updated: 11/30/20 1604     Whole Blood Glucose POCT 86 mg/dL             Rads:     Results for orders placed or performed during the hospital encounter of 11/30/20   MRI Brain W WO Contrast    Narrative    History: TIA.    COMPARISON: CT of the brain dated 11/30/2020.    TECHNIQUE: Multiplanar MR imaging of the brain  was performed on  a 1.5  Tesla MRI scanner. Images were acquired prior to and following the  intravenous administration of 20 cc of Clariscan.    FINDINGS: There are mild foci of T2 and FLAIR hyperintense signal within  the cerebral white matter which are nonspecific but likely reflect  chronic small vessel ischemic change in a patient of this age. 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 There  is no restricted diffusion to suggest acute infarction. There is no  evidence of acute intracranial hemorrhage. There is diffuse cerebral  volume loss. There is no hydrocephalus. There is no mass effect or  midline shift. No extra-axial collections are seen. No areas of abnormal  enhancement are seen within the brain on the postcontrast images. The  corpus callosum is normally formed. The sella is unremarkable. The  cerebellar tonsils are normally positioned. The major intracranial flow  voids are preserved. There are bilateral lens implants. Mild mucosal  thickening is seen within the paranasal sinuses.      Impression    1. No acute intracranial abnormality.  2. Diffuse cerebral volume loss and chronic small vessel ischemic  changes.    Georgann Housekeeper, MD   12/01/2020 7:47 AM   MR Angiogram Head WO Contrast    Narrative    HISTORY: CVA.    COMPARISON: None.    TECHNIQUE:  An MR angiogram of the neck was obtained without and with IV  contrast, and an MR angiogram of the intracranial circulation was  obtained without IV contrast.  The patient received an intravenous  injection of 20 mL of Clariscan contrast material.    Any proximal internal carotid artery narrowing was determined utilizing  the distal internal carotid artery as a reference, similar to the NASCET  methodology.    FINDINGS: Some of the images are degraded by patient motion. The origins  of the great vessels at the aortic arch are patent. The cervical  vertebral arteries are patent. The left vertebral artery is dominant.  The  common carotid arteries are patent. There is irregularity of the  carotid bifurcations, likely on the basis of underlying atherosclerotic  plaque. There is no stenosis involving the carotid bifurcations or  cervical internal carotid arteries.    The intracranial portions of the internal carotid arteries are patent.  The anterior, middle and posterior cerebral arteries are patent. The  basilar artery and intracranial portions of the vertebral arteries are  patent. No intracranial aneurysm is seen.      Impression    1. There is no stenosis involving the cervical portions of the carotid  or vertebral arteries.  2. There is no stenosis involving the intracranial arterial vasculature.    Georgann Housekeeper, MD   12/01/2020 7:57 AM   CT Head without Contrast    Narrative    HISTORY: Pain after fall. Confusion.    COMPARISON: None.    TECHNIQUE: Noncontrast CT of the head. The following dose reduction  techniques were utilized: automatic exposure control and/or adjustment  of mA and/or kV according to patient size, and the use of iterative  reconstruction technique.    FINDINGS: There are hypoattenuating areas within the subcortical and  periventricular white matter which are nonspecific in nature but likely  reflect chronic small vessel ischemic disease in a patient of this age.  There is no evidence of acute territorial infarction or intracranial  hemorrhage. The ventricles and sulci are proportionately prominent,  reflecting diffuse cerebral volume loss. There  is no mass-effect or  midline shift. No extra-axial collections are seen. Intracranial  vascular calcifications are seen.. There are bilateral lens implants.  The visualized portions of the paranasal sinuses and mastoid air cells  are clear.      Impression     No acute intracranial abnormality. Diffuse cerebral volume  loss and chronic small vessel ischemic changes.     Georgann Housekeeper, MD   11/30/2020 4:59 PM       Katheren Shams, FNP-C  Nurse Practitioner  [facility]  Neurology  Daytime: [id number]  Consult requests: [phone]  After 5:00 pm: [phone]    *This note was generated by the Epic EMR system and Dragon speech recognition and may contain errors or omissions not intended by the user. Grammatical errors, random word insertions, deletions, incomplete sentences among other errors are occasional consequences of this technology.  If there are questions or concerns about the content of this note or information contained within the body of this dictation they should be addressed directly with the author for clarification.

## 2020-12-01 NOTE — Progress Notes (Signed)
Received pt from ED,pt is awake alert oriented x3.    With infusing basal insulin (continous insulin pump from home on left side Abdomen) at rate 3unit/hr.Wife at bedside showed to RN the insulin pump and told this RN that patient self administer bolus insulin through this pump during meal hours depending on sugar level.    01:27H- Dr Gwendolyn Lima seen patient at bedside and told RN to discontinue insulin pump this time,will just cover AC-HS per sliding scale.  Insulin pump was discontinued and removed by the wife.Wife will bring home the isulin pump.

## 2020-12-02 DIAGNOSIS — R509 Fever, unspecified: Secondary | ICD-10-CM | POA: Diagnosis not present

## 2020-12-02 DIAGNOSIS — R531 Weakness: Secondary | ICD-10-CM | POA: Diagnosis not present

## 2020-12-02 DIAGNOSIS — R197 Diarrhea, unspecified: Secondary | ICD-10-CM | POA: Diagnosis not present

## 2020-12-02 DIAGNOSIS — Z794 Long term (current) use of insulin: Secondary | ICD-10-CM | POA: Diagnosis not present

## 2020-12-02 DIAGNOSIS — E1165 Type 2 diabetes mellitus with hyperglycemia: Secondary | ICD-10-CM | POA: Diagnosis not present

## 2020-12-02 DIAGNOSIS — Z9641 Presence of insulin pump (external) (internal): Secondary | ICD-10-CM | POA: Diagnosis not present

## 2020-12-02 DIAGNOSIS — R4182 Altered mental status, unspecified: Secondary | ICD-10-CM | POA: Diagnosis not present

## 2020-12-02 DIAGNOSIS — E872 Acidosis: Secondary | ICD-10-CM | POA: Diagnosis not present

## 2020-12-02 LAB — CBC AND DIFFERENTIAL
Absolute NRBC: 0 10*3/uL (ref 0.00–0.00)
Basophils Absolute Automated: 0.02 10*3/uL (ref 0.00–0.08)
Basophils Automated: 0.3 %
Eosinophils Absolute Automated: 0.21 10*3/uL (ref 0.00–0.44)
Eosinophils Automated: 3.5 %
Hematocrit: 45.5 % (ref 37.6–49.6)
Hgb: 14.8 g/dL (ref 12.5–17.1)
Immature Granulocytes Absolute: 0.02 10*3/uL (ref 0.00–0.07)
Immature Granulocytes: 0.3 %
Lymphocytes Absolute Automated: 0.97 10*3/uL (ref 0.42–3.22)
Lymphocytes Automated: 16.2 %
MCH: 30.3 pg (ref 25.1–33.5)
MCHC: 32.5 g/dL (ref 31.5–35.8)
MCV: 93 fL (ref 78.0–96.0)
MPV: 10.2 fL (ref 8.9–12.5)
Monocytes Absolute Automated: 0.77 10*3/uL (ref 0.21–0.85)
Monocytes: 12.9 %
Neutrophils Absolute: 3.99 10*3/uL (ref 1.10–6.33)
Neutrophils: 66.8 %
Nucleated RBC: 0 /100 WBC (ref 0.0–0.0)
Platelets: 134 10*3/uL — ABNORMAL LOW (ref 142–346)
RBC: 4.89 10*6/uL (ref 4.20–5.90)
RDW: 14 % (ref 11–15)
WBC: 5.98 10*3/uL (ref 3.10–9.50)

## 2020-12-02 LAB — GLUCOSE WHOLE BLOOD - POCT
Whole Blood Glucose POCT: 242 mg/dL — ABNORMAL HIGH (ref 70–100)
Whole Blood Glucose POCT: 221 mg/dL — ABNORMAL HIGH (ref 70–100)

## 2020-12-02 LAB — BASIC METABOLIC PANEL
Anion Gap: 11 (ref 5.0–15.0)
BUN: 18 mg/dL (ref 9.0–28.0)
CO2: 18 mEq/L — ABNORMAL LOW (ref 22–29)
Calcium: 8.3 mg/dL (ref 7.9–10.2)
Chloride: 106 mEq/L (ref 100–111)
Creatinine: 0.9 mg/dL (ref 0.7–1.3)
Glucose: 222 mg/dL — ABNORMAL HIGH (ref 70–100)
Potassium: 4.1 mEq/L (ref 3.5–5.1)
Sodium: 135 mEq/L — ABNORMAL LOW (ref 136–145)

## 2020-12-02 LAB — STOOL FOR SALMONELLA,SHIGELLA,CAMPYLOBACTER AND SHIGA TOXIN PCR
Stool Campylobacter jejunii/coli by PCR: NEGATIVE
Stool Salmonella Species by PCR: NEGATIVE
Stool Shiga Toxin by PCR: NEGATIVE
Stool Shigella Species/Enteroinvasive Escherichia coli PCR: NEGATIVE

## 2020-12-02 LAB — GFR: EGFR: >60

## 2020-12-02 MED ORDER — EMPAGLIFLOZIN 25 MG PO TABS
25.0000 mg | ORAL_TABLET | Freq: Every morning | ORAL | Status: AC
Start: 2020-12-02 — End: ?

## 2020-12-02 MED ORDER — INSULIN GLARGINE 100 UNIT/ML SC SOLN
30.0000 [IU] | Freq: Every day | SUBCUTANEOUS | 0 refills | Status: AC
Start: 2020-12-02 — End: ?

## 2020-12-02 MED ORDER — INSULIN LISPRO 100 UNIT/ML SC SOLN
10.0000 [IU] | Freq: Three times a day (TID) | SUBCUTANEOUS | 0 refills | Status: DC
Start: 2020-12-02 — End: 2020-12-02

## 2020-12-02 MED ORDER — INSULIN GLARGINE 100 UNIT/ML SC SOLN
30.0000 [IU] | Freq: Every day | SUBCUTANEOUS | 0 refills | Status: DC
Start: 2020-12-02 — End: 2020-12-02

## 2020-12-02 MED ORDER — INSULIN ASPART 100 UNIT/ML SC SOLN
10.0000 [IU] | Freq: Three times a day (TID) | SUBCUTANEOUS | 0 refills | Status: AC
Start: 2020-12-02 — End: ?

## 2020-12-02 MED ORDER — INSULIN LISPRO 100 UNIT/ML SC SOLN
10.0000 [IU] | Freq: Three times a day (TID) | SUBCUTANEOUS | Status: DC
Start: 2020-12-02 — End: 2020-12-02
  Administered 2020-12-02: 12:00:00 10 [IU] via SUBCUTANEOUS
  Filled 2020-12-02: qty 30

## 2020-12-02 NOTE — Progress Notes (Signed)
ENDOCRINE PROGRESS NOTE    Date Time: 12/02/20 9:45 AM  Patient Name: Kent [name]  Attending Physician: Durene Romans, DO  Consulting Attending Physician: Durene Romans, DO   Admit Date:  11/30/2020  Payor: MEDICARE [employer] / Plan: BCBS OOS MEDICARE HMO / Product Type: MANAGED MEDICARE /   Orders Placed This Encounter   Procedures    Diet Consistent Carbohydrate and Heart Healthy        Total time of encounter was 35 min > 50% time was spent reviewing his chart, updating his orders, and at the bedside counseling & coordination of care as stated in plan below- including discussing risk and benefits of treatment options and importance of close monitoring for hypoglycemia or hypoglycemia.    Assessment/Plan:     [name] is a 75 y.o. male with past medical history of type 2 diabetes, hyperlipidemia, hypertension, who presented to the hospital on 11/30/2020 with altered mental status and fall, found to have fevers and diarrhea. Endocrinology consulted for management of type 2 diabetes.  Home diabetes regimen: Minimed Insulin pump (with Novolog) - basal settings: 0000-0800: 3.6 units/hr, 0800-000: 3.8 units/hr (total daily basal insulin dose of 88.8 units); I:C ratio of 1:4, , Jardiance 25 mg p.o. once daily, metformin 850 mg p.o. twice daily; HbA1c this admission: 7.3%    1. Type 2 diabetes with hyperglycemia   Patient with both fasting and prandial hyperglycemia   Insulin pump was discontinued on admission. Given altered mental status, he was transitioned to basal/bolus insulin regimen   Discharge on Lantus 30 units qAM, Lispro 10 units qAC, Lispro MDSSI qAC, and Metformin 850 mg PO BID   Would hold Jardiance until patient's diarrhea has resolved   I have written out insulin instruction on patient's discharge paperwork   Attempted to call patient's wife to update her on insulin recommendations but she did not answer   Patient should follow-up with his PCP, Dr. Guerry Bruin, in [facility]  after hospital discharge for consideration of re-initiation of insulin pump once patient's mental status has returned to baseline      Kathyrn Drown T, DO, thank you for this consultation.  We will follow the patient with you during this hospitalization.  Please contact me with any questions or issues.      Lind Guest, MD  [facility] Endocrinology  Epic Chat or pager ID: [phone]  Hours Monday-Friday; 8am-5pm  On nights, holidays and weekends please page the endocrine consult pager [phone]) for emergencies.     Subjective:    Pt seen at bedside. Has no complaints. Getting discharged today.    Review of Systems:   10 point ROS reviewed and negative, except per subjective/HPI.    Physical Exam:   Temp:  [98.2 F (36.8 C)-99.1 F (37.3 C)] 99 F (37.2 C)  Heart Rate:  [81-100] 81  Resp Rate:  [16-19] 18  BP: (108-143)/(61-73) 143/73  Wt Readings from Last 1 Encounters:   12/01/20 99.8 kg (220 lb)      Body mass index is 30.26 kg/m.   No intake or output data in the 24 hours ending 12/02/20 0945    General: overweight, awake, alert, oriented x 3; no acute distress  HEENT: eomi, sclera anicteric   Lungs: normal work of breathing on room air  Skin: no rashes or lesions noted    Meds:     Scheduled Meds:  Current Facility-Administered Medications   Medication Dose Route Frequency    aspirin  81 mg Oral Daily    clopidogrel  75 mg Oral Daily    enoxaparin  40 mg Subcutaneous Daily    fenofibrate micronized  134 mg Oral QAM AC    insulin glargine  30 Units Subcutaneous Daily    insulin lispro  1-4 Units Subcutaneous QHS    insulin lispro  1-8 Units Subcutaneous TID AC    insulin lispro  10 Units Subcutaneous TID AC    rosuvastatin  40 mg Oral QHS     Continuous Infusions:   IV fluids with sodium bicarbonate 100 mL/hr (12/01/20 2200)     PRN Meds:.acetaminophen, calcium carbonate, Nursing communication: Adult Hypoglycemia Treatment Algorithm **AND** dextrose **AND** dextrose **AND** glucagon (rDNA), hydrALAZINE,  labetalol, melatonin, senna-docusate    Labs:     Lab Results   Component Value Date    HGBA1C 7.3 (H) 12/01/2020       Whole Blood Glucose POCT   Date/Time Value Ref Range Status   12/02/2020 0722 242 (H) 70 - 100 mg/dL Final   16/08/9603 5409 186 (H) 70 - 100 mg/dL Final   81/19/1478 2956 333 (H) 70 - 100 mg/dL Final   21/30/8657 8469 299 (H) 70 - 100 mg/dL Final   62/95/2841 3244 246 (H) 70 - 100 mg/dL Final       Recent Labs     12/02/20  0406 12/01/20  0431   WBC 5.98 8.44   Hgb 14.8 16.1   Hematocrit 45.5 49.8*   Platelets 134* 128*   MCV 93.0 95.4       Recent Labs     12/02/20  0406 12/01/20  1236 12/01/20  0431 12/01/20  0431   Sodium 135* 132*   < > 133*   Potassium 4.1 4.2   < > 4.0   Chloride 106 103   < > 103   CO2 18* 18*   < > 15*   BUN 18.0 21.0   < > 22.0   Creatinine 0.9 1.3   < > 1.3   Glucose 222* 333*   < > 158*   Calcium 8.3 7.9   < > 8.0   Magnesium  --   --   --  1.9    < > = values in this interval not displayed.       Recent Labs     11/30/20  1847   AST (SGOT) 22   ALT 22   Alkaline Phosphatase 42   Protein, Total 5.7*   Albumin 3.4*       Recent Labs     11/30/20  1847   PT 11.2   PT INR 0.9         Signed by: Lind Guest, MD

## 2020-12-02 NOTE — Discharge Instr - AVS First Page (Addendum)
Reason for your Hospital Admission:  Diarrhea and dehydration      Instructions for after your discharge:  Follow up with providers listed below                    ===================================================================     ENDOCRINOLOGY HOSPITAL DISCHARGE RECOMMENDATIONS    Insulin Instructions Handout     Check your blood sugar Your target BG numbers     [x]      Fasting/before breakfast      80 - 130 mg/dL       [x]     Before meals     100-150 mg/dL         INSULIN Rx YOUR DOCTOR HAS PRESCRIBED for you:     Breakfast Dinner Bedtime   Long acting insulin:    []  NPH  []  Degludec = Tresiba  [x]  Glargine = Lantus or Basaglar  []  Detemir = Levemir             *30 units               AND     Breakfast Lunch Dinner   Rapid/short acting insulin:      [x] Aspart = Novolog   [] Lispro = Humalog   [] Glulisine= Apidra   [] Regular         10 units       10 units       10 units       Blood Glucose (BG) Extra short acting insulin before meals and bedtime      151 - 200 +1   201-250 +3   251-300 +5   301-350 +7    Greater than 350*  +8         SHORT ACTING INSULIN:    Watch for symptoms of low sugar (shakiness, weakness, confusion, lightheadedness, or sweats) If it is low (<70), have a small snack such as 4 oz of orange juice, 1 cup of milk, 4-5 pieces of hard candy or 3-4 glucose tablets.  Recheck sugar in 15 minutes.  If not improved, can repeat as needed.  If your blood glucose is elevated before breakfast, lunch and dinner you many require additional correction with Apidra, Humalog or Novolog. (see above)     *Contact your Physician if blood glucose stays above 350.     Example Insulin Combinations:  Before breakfast your BGI is 220. You would take your 10 units + 3 additional units = 13 units.        BASAL INSULINS  Basal insulin provides a constant background level of insulin in the bloodstream throughout the day and night which controls pre-meal and overnight blood glucose levels.      Your basal  insulin is Lantus, Basaglar, Levemir, Tresiba, or NPH.  Basal insulins are generally taken by most users before breakfast and/or at night.    Instructions for Basal insulin:    For basal insulin, start with 30 units, given every morning.    Test your fasting blood sugar every morning.  Target morning blood sugar is 80-130 mg/dL.    If your fasting blood sugar is above 130 mg/dL for 2 days in a row, increase the TOTAL basal insulin dose by 2 units.     Continue to increase the basal insulin every 2 days until your fasting blood sugar comes into target range.       Decrease your daily basal insulin dose by 4 units if hypoglycemia/low blood sugar (less than 80)  occurs during the night or in the morning, without any explanation, e.g. you ate less or exercised more during the day or evening.    [facility] Endocrinology Team

## 2020-12-02 NOTE — Discharge Summary (Addendum)
[facility] HOSPITALIST DISCHARGE SUMMARY    Date Time: 12/02/20 1:41 PM  Patient Name: Kent [name]  Attending Physician: Durene Romans, DO    Date of Admission:   11/30/2020    Date of Discharge:   12/02/2020    Reason for Admission:   Generalized weakness [R53.1]  Fall, initial encounter [W19.XXXA]  AMS (altered mental status) [R41.82]    Problems:   Lists the present on admission hospital problems  Present on Admission:   AMS (altered mental status)      Problem Lists:  Patient Active Problem List   Diagnosis    AMS (altered mental status)       Discharge Dx:   Generalized weakness [R53.1]  Fall, initial encounter [W19.XXXA]  AMS (altered mental status) [R41.82]    Consultations:   Treatment Team: Attending Provider: Durene Romans, DO; Consulting Physician: Maryjo Rochester; Registered Nurse: Gillis Ends, RN; Speech Language Pathologist: Geraldo Docker, SLP; Case Manager: Sookoor, Basdai, RN    Procedures performed:     MR Angiogram Head WO Contrast   Final Result      1. There is no stenosis involving the cervical portions of the carotid   or vertebral arteries.   2. There is no stenosis involving the intracranial arterial vasculature.      Georgann Housekeeper, MD    12/01/2020 7:57 AM      MR Angiogram Neck W WO Contrast   Final Result      1. There is no stenosis involving the cervical portions of the carotid   or vertebral arteries.   2. There is no stenosis involving the intracranial arterial vasculature.      Georgann Housekeeper, MD    12/01/2020 7:57 AM      MRI Brain W WO Contrast   Final Result      1. No acute intracranial abnormality.   2. Diffuse cerebral volume loss and chronic small vessel ischemic   changes.      Georgann Housekeeper, MD    12/01/2020 7:47 AM      XR Chest  AP Portable   Final Result         Left basilar linear atelectasis.      Nonda Lou, MD    11/30/2020 5:14 PM      CT Head without Contrast   Final Result    No acute intracranial abnormality. Diffuse cerebral volume   loss and chronic  small vessel ischemic changes.       Georgann Housekeeper, MD    11/30/2020 4:59 PM          Presenting history and hospital Course:   HPI per admitting MD:  " Kent Dice Robinsonis a 75 y.o.malewith h/o type 2 DM treated with insulin pump + oral medications, HLD, and hearing impairment who was brought to ER with chief complaint of confusion.   His wife states they are here visiting family and were supposed to drive back to [place] today. Patient was doing well in the morning except that he had some diarrhea. He was carrying a load to his car later when he fell on a neighbor's drive way. Apparently, he was confused and walked to the wrong house. He fell backward but denied hitting his head (the neighbor also didn't think patient hit his head). He looked pale and confused when he returned home. Wife checked his glucose and it was 184, then went higher to 275 after he ate. He took a nap and  was progressively more confused when he got up. He was saying things that did not make sense. He was also generally very weak and unable to get up. He urinated on himself. She decided to call 911 at that point. She states he had low grade fever (temp 100F) at home. No headache, cough, congestion, chest pain, abdominal pain, nausea/vomiting.   The wife states patient has become less confused since arrival to ED.   At baseline, patient has no memory problem, does not get confused, and has no problem with gait.   He is vaccinated for COVID-19, and had a booster in Sept/2021.   His wife was positive for COVID-19 at the end of December but has tested negative since then.     Patient is awake and answering questions appropriately. However, difficult to get detailed history from him since he did not come with his hearing aide."    Hospital course  He was admitted for encephalopathy work-up.  His brain MRI and MRA of head and neck were unremarkable.  His metabolic encephalopathy is quite likely related to electrolyte abnormality and  dehydration from diarrhea.  An extensive work-up for diarrhea and fever were done with UA, chest x-ray, C. difficile, stool PCR for Salmonella, Shigella, Campy, Shiga were normal. His blood cultures were ngtd. He does not have leukocytosis and his fever now resolved.  Patient was started on bicarb drip to correct his metabolic acidosis.  His mental status now has resolved back to baseline.   He was seen by endocrinology to manage his insulin therapy.  As of now, his insulin therapy was changed to Lantus 30 u daily, lispro 10u TIDAC and metformin 850 mg twice daily, they recommended to hold Jardiance until his diarrhea resolved.  He was advised to follow-up with his PCP to discuss about reinitiation of insulin pump.     I had advised the patient the importance of following up closely with outpatient specialists and patient's PCP for continued management patient's disease status. Patient understood the instructions and appreciate the care that we have given. Pt is medically and neurologically stable to discharge home.     Addendum 12/06/20:  His vitamin B1 lab is back as <6. I reached out to pt however, the phone numbers on chart are incorrect. I reached out to his PCP, Dr Wylene Simmer in [facility]. I discussed w him about Vitamin B1 lab finding and Vitamin B1 repletion plan and learned that pt has an appointment to follow up with him on 12/07/20. I have asked our RN staff here to fax the [place] summary to his fax number: [phone].     Physical exam at discharge:  Vitals:    12/02/20 0705   BP: 143/73   Pulse: 81   Resp: 18   Temp: 99 F (37.2 C)   SpO2: 93%     General: awake, alert, oriented x 3; no acute distress.  HEENT: perrla, eomi, sclera anicteric  oropharynx clear without lesions, mucous membranes moist  Neck: supple, no lymphadenopathy, no thyromegaly, no JVD, no carotid bruits  Cardiovascular: regular rate and rhythm, no murmurs, rubs or gallops  Lungs: clear to auscultation bilaterally, without wheezing, rhonchi, or  rales  Abdomen: soft, non-tender, non-distended; no palpable masses, no hepatosplenomegaly, normoactive bowel sounds, no rebound or guarding  Extremities: no clubbing, cyanosis, or edema  Neuro: cranial nerves grossly intact, strength 5/5 in upper and lower extremities, sensation intact  Skin: no rashes or lesions noted    Discharge Medications:  Medication List      START taking these medications    insulin glargine 100 UNIT/ML injection  Commonly known as: LANTUS  Inject 30 Units into the skin daily     insulin lispro 100 UNIT/ML injection  Commonly known as: HumaLOG  Inject 10 Units into the skin 3 (three) times daily before meals        CHANGE how you take these medications    empagliflozin 25 MG tablet  Commonly known as: Jardiance  Take 1 tablet (25 mg total) by mouth every morning Hold temporarily while having diarrhea.  What changed: additional instructions        CONTINUE taking these medications    aspirin 81 MG chewable tablet     clopidogrel 75 mg tablet  Commonly known as: PLAVIX     fenofibrate micronized 134 MG capsule  Commonly known as: LOFIBRA     metFORMIN 850 MG tablet  Commonly known as: GLUCOPHAGE     multivitamin capsule     pseudoephedrine-acetaminophen 30-500 MG Tabs  Commonly known as: TYLENOL SINUS     rosuvastatin 40 MG tablet  Commonly known as: CRESTOR        STOP taking these medications    Insulin Pump [id number] Kit           Where to Get Your Medications      These medications were sent to [facility]  [address], [place] [address]    Hours: Monday - Friday 8AM to 8PM, Saturday - Sunday 9A to 5PM Phone: [phone]    insulin glargine 100 UNIT/ML injection   insulin lispro 100 UNIT/ML injection     Information about where to get these medications is not yet available    Ask your nurse or doctor about these medications   empagliflozin 25 MG tablet          Attempted to contact PCP to update but unable to reach    Discharge Instructions:    Dr. Guerry Bruin    Schedule an appointment as soon as possible for a visit in 1 week(s)  follow up for diabetes        TIME SPENT:   On discharge and care coordination is 35 minutes. I have spent a large amount of time explaining everything to the patient and family at bedside, going over all the details of discharge diagnoses and the follow up plan.  Signed by: Durene Romans, DO, FACP    CC to: Pcp, None, MD

## 2020-12-02 NOTE — Plan of Care (Signed)
Pt A&Ox4, moving all extremities well, ambulating with steady gait.  Denies pain, tolerating po.  Bm x1 this am - soft, no longer liquid.  Blood sugars improving on Lantus, scheduled Humalog+ SSI.  Pt is being discharge home.  Pt and wife verbalized understanding of d.c instructions and need for follow up with PCP and Endocrine doctor back in [place].  Prescription for Lantus given.  PIV removed.  Escorted to lobby via wheelchair

## 2020-12-02 NOTE — Consults (Signed)
[facility] [place]  Consultation    Date Time: 12/02/20 1:06 PM  Patient Name: Kent [name], Kent [name]  Medical Record Number: [id number]   Primary Care Physician: Marisa Sprinkles, MD  Requesting Physician: Durene Romans, DO    Reason for Consultation:     -Metabolic acidosis     Assessment:     -Non gap metabolic acidosis due to diarrhea; urine anion gap negative.   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 Thank you for allowing [place] to participate in the care of this patient.    Burnis Medin, MD  Epic Secure Chat  Office - [phone]  History:   [name] is a 75 y.o. male with HTN, HLD, CKD II, DM II, who presents to the hospital on 11/30/2020 with AMS and fever.  He also has had diarrhea for several days with some fever.  I have been asked to him for metabolic acidosis.  Past Medical and Surgical  History:     Past Medical History:   Diagnosis Date    Hyperlipidemia     Type 2 diabetes mellitus      History reviewed. No pertinent surgical history.  Family History:   History reviewed. No pertinent family history.  Social History:     Social History     Socioeconomic History    Marital status: Married   Tobacco Use    Smoking status: Former Smoker    Smokeless tobacco: Never Used   Substance and Sexual Activity    Alcohol use: Yes     Comment: 2 beers a month     Allergies:   No Known Allergies  Medications:     Current Facility-Administered Medications   Medication Dose Route Frequency    aspirin  81 mg Oral Daily    clopidogrel  75 mg Oral Daily    enoxaparin  40 mg Subcutaneous Daily    fenofibrate micronized  134 mg Oral QAM AC    insulin glargine  30 Units Subcutaneous Daily    insulin lispro  1-4 Units  Subcutaneous QHS    insulin lispro  1-8 Units Subcutaneous TID AC    insulin lispro  10 Units Subcutaneous TID AC    rosuvastatin  40 mg Oral QHS     Review of Systems:   10 systems were reviewed and were negative  Physical Exam:   Temp:  [98.8 F (37.1 C)-99.1 F (37.3 C)] 99 F (37.2 C)  Heart Rate:  [81-96] 81  Resp Rate:  [16-19] 18  BP: (113-143)/(61-73) 143/73  Intake and Output Summary (Last 24 hours) at Date Time  No intake or output data in the 24 hours ending 12/02/20 1306    Gen: Well developed, no acute distress  HEENT: NC/AT, moist mucous membranes  Eyes: Anicteric sclera, PERRLA  Neck: No JVD, Supple, no lymphadenopathy  CV: S1 S2 N RRR no M/R/G/C, no edema  Chest: Good effort, CTAB, symmetric expansion  Ab: ND NT Soft no HSM +BS  Skin: Dry, intact  Extremity: No lymphadenopathy  Psych: Appropriate mood and affect, alert and oriented to person, place, time  Labs:     Recent Labs   Lab 12/02/20  0406 12/01/20  1236 12/01/20  0431 11/30/20  1847 11/30/20  1847   Glucose 222* 333* 158*  More results in Results Review 168*   BUN 18.0 21.0 22.0  More results in Results Review 25.0   Creatinine 0.9 1.3 1.3  More results in Results Review 1.4*   Calcium 8.3 7.9 8.0  More results in Results Review 8.2   Sodium 135* 132* 133*  More results in Results Review 132*   Potassium 4.1 4.2 4.0  More results in Results Review 4.1   Chloride 106 103 103  More results in Results Review 101   CO2 18* 18* 15*  More results in Results Review 17*   Albumin  --   --   --   --  3.4*   Magnesium  --   --  1.9  --   --    More results in Results Review = values in this interval not displayed.     Recent Labs   Lab 12/02/20  0406 12/01/20  0431 11/30/20  1847   WBC 5.98 8.44 9.77*   RBC 4.89 5.22 5.19   Hgb 14.8 16.1 15.7   Hematocrit 45.5 49.8* 49.1   MCV 93.0 95.4 94.6   MCH 30.3 30.8 30.3   MCHC 32.5 32.3 32.0   RDW 14 14 14    MPV 10.2 9.8 10.3   Platelets 134* 128* 158     Radiology:   Radiological Procedure personally  reviewed.   EKG:   Reviewed  Prior Records:   I reviewed the old records.

## 2020-12-02 NOTE — Progress Notes (Signed)
INFECTIOUS DISEASES PROGRESS NOTE       Antibiotics:    None    Assessment:   Kent [name] is a 75 y.o. male with diabetes (A1C 7.3) with insulin pump, HLD, HTN, hard of hearing     COVID vaccinated including booster    Lives in [place] and was here visiting family (traveled by car with his wife).     Admitted 11/30/2020 with confusion s/p fall    Fever - resolved  Mild leukocytosis - resolved  Mild thrombocytosis - ongoing  AKI - resolved  No pyuria  CXR with atelectasis  Hyperglycemia/glucosuria - endocrine following    Possible gastroenteritis (would likely be viral)  Diarrhea improved and fever resolved    Plan:    Fever  Blood cultures NGTD  Would monitor off antibiotic at this point  PT/OT following for weakness    Ok for discharge from ID standpoint    Discussed with patient and Dr Donia Ast  [facility]    [phone]  Pager: [phone]    Subjective:    Now afebirle with max 99  Not on antibiotic  ?diarrhea    Review of Systems:    No fevers, chills, headache, vision changes, nausea, vomiting, abdominal pain, diarrhea, chest pain, sob, arthralgias, myalgias, rashes, dysuria, weakness    Medications:     Current Facility-Administered Medications   Medication Dose Route Frequency    aspirin  81 mg Oral Daily    clopidogrel  75 mg Oral Daily    enoxaparin  40 mg Subcutaneous Daily    fenofibrate micronized  134 mg Oral QAM AC    insulin glargine  30 Units Subcutaneous Daily    insulin lispro  1-4 Units Subcutaneous QHS    insulin lispro  1-8 Units Subcutaneous TID AC    insulin lispro  10 Units Subcutaneous TID AC    rosuvastatin  40 mg Oral QHS       Physical Exam:      Vitals:    12/02/20 0705   BP: 143/73   Pulse: 81   Resp: 18   Temp: 99 F (37.2 C)   SpO2: 93%     Vitals reviewed.     GEN: NAD. Appears comfortable in bed.  HEENT: anicteric  CV: normal S1S2, RR, no MGR  PULM: CTAB, no wheezing  ABD: NABS, soft, nontender, nondistended  GU: no foley  EXT: no  edema BLE  DERM: No rashes     Lines:    Central Line: no  PIV without erythema or tenderness    Microbiology:    Blood cx 1/19 pend  COVID 1/19 NG  Cdiff 1/19 NG  Stool pathogen PCR NG    All microbiology results reviewed.     Labs:      Recent Labs     12/02/20  0406   WBC 5.98   Hgb 14.8   Hematocrit 45.5   Platelets 134*         Recent Labs     12/02/20  0406   Glucose 222*   BUN 18.0   Creatinine 0.9   Sodium 135*   Potassium 4.1   Chloride 106   CO2 18*       Lab results reviewed.    Radiology:      Radiology Results (24 Hour)     ** No results found for the last 24 hours. **  All radiological procedures reviewed.

## 2020-12-02 NOTE — OT Progress Note (Signed)
[facility]   Occupational Therapy Treatment     Patient: Kent [name]    MRN#: [id number]   Unit: [facility] TOWER 6 EAST  Bed: F621/H086.57      Post Acute Care Therapy Recommendations:   Discharge Recommendations: Home with supervision     Milestones to be reached to achieve recommendation: none  Anticipate achievement in NA sessions    DME Recommended for Discharge: Shower chair        Therapy discharge recommendations may change with patient status.  Please refer to most recent note for up-to-date recommendations.    Assessment:   Significant Findings: NA    Received pt up in chair. Alert and oriented followed simple directions.strength and coordination WFL,  Pt completed sink side ADL with supervision,completed dressing skills in sitting with set up pt takes time to complete fastner ,(base line).Educated pt for safety during ADL and functional mobility ,energy conversation tech demonstrated learning. Pt will need initial supervision upon d/c. Pt met goals . Will d/c OT services.      Treatment Activities: ADL training,functional transfers, pt education     Educated the patient to role of occupational therapy, plan of care, goals of therapy and HEP, safety with mobility and ADLs, discharge instructions, home safety.    Plan:    OT Frequency Recommended: follow-up visit only     Discharge from OT Acute Care Services.       Precautions and Contraindications:   Act as tolerated ,fall     Updated Medical Status/Imaging/Labs:  Reviewed     Subjective:I am doing good   Patient's medical condition is appropriate for Occupational Therapy intervention at this time.  Patient is agreeable to participation in the therapy session. Nursing clears patient for therapy.    Pain:   Scale: 0/10  Location:   Intervention:     Objective:   Patient is seated in a cardiac chair with telemetry and peripheral IV in place.  Pt wore mask during therapy session:Yes    Cognition  Alert and oriented  followed simple directions     Functional Mobility  Rolling:  NT  Supine to Sit: NT  Sit to Stand: supervision  Transfers: supervision    PMP Activity: Step 7 - Walks out of Room    Balance  Static Sitting: good  Dynamic Sitting: good  Static Standing: good  Dynamic Standing: good-    Self Care and Home Management  Eating: I  Grooming: set up  Bathing: supervision  UE Dressing: I  LE Dressing: mod I  Toileting: I    Therapeutic Exercises  With activity     Participation: good  Endurance: good-    Patient left with call bell within reach, all needs met, SCDs not in use RN aware, fall mat in place, bed alarm NA, chair alarm on and all questions answered. RN notified of session outcome and patient response.     Goals:  Time For Goal Achievement: 7 visits  ADL Goals  Patient will dress upper body: Goal met  Patient will dress lower body: Goal met  Mobility and Transfer Goals  Pt will perform functional transfers: Goal met  Neuro Re-Ed Goals  Pt will perform dynamic standing balance: Goal met  Musculoskeletal Goals  Other Goal:  (goal met)                     PPE worn during session: procedural mask, face shield and gloves  Tech present: NA  PPE worn by tech: N/A    Ander Purpura, MA/OTR, CSRS, LSVT-BIG  [phone]    Time of Treatment  OT Received On: 12/02/20  Start Time: 1191  Stop Time: 1025  Time Calculation (min): 45 min    Treatment # 1 of 7 visits

## 2020-12-02 NOTE — Plan of Care (Addendum)
Problem: Safety  Goal: Patient will be free from injury during hospitalization  12/02/2020 0204 by Aviva Kluver, RN  Outcome: Progressing  Flowsheets (Taken 12/01/2020 1940)  Patient will be free from injury during hospitalization:   Assess patient's risk for falls and implement fall prevention plan of care per policy   Provide and maintain safe environment   Use appropriate transfer methods   Hourly rounding   Assess for patients risk for elopement and implement Elopement Risk Plan per policy  12/01/2020 1940 by Aviva Kluver, RN  Outcome: Progressing  Flowsheets (Taken 12/01/2020 1940)  Patient will be free from injury during hospitalization:   Assess patient's risk for falls and implement fall prevention plan of care per policy   Provide and maintain safe environment   Use appropriate transfer methods   Hourly rounding   Assess for patients risk for elopement and implement Elopement Risk Plan per policy     Problem: Pain  Goal: Pain at adequate level as identified by patient  12/02/2020 0204 by Aviva Kluver, RN  Outcome: Progressing  Flowsheets (Taken 12/01/2020 1940)  Pain at adequate level as identified by patient:   Identify patient comfort function goal   Assess pain on admission, during daily assessment and/or before any "as needed" intervention(s)   Evaluate if patient comfort function goal is met   Evaluate patient's satisfaction with pain management progress  12/01/2020 1940 by Aviva Kluver, RN  Outcome: Progressing  Flowsheets (Taken 12/01/2020 1940)  Pain at adequate level as identified by patient:   Identify patient comfort function goal   Assess pain on admission, during daily assessment and/or before any "as needed" intervention(s)   Evaluate if patient comfort function goal is met   Evaluate patient's satisfaction with pain management progress     Problem: Side Effects from Pain Analgesia  Goal: Patient will experience minimal side effects of analgesic therapy  Outcome:  Progressing  Flowsheets (Taken 12/01/2020 1940)  Patient will experience minimal side effects of analgesic therapy:   Monitor/assess patient's respiratory status (RR depth, effort, breath sounds)   Assess for changes in cognitive function       Problem: Discharge Barriers  Goal: Patient will be discharged home or other facility with appropriate resources  Outcome: Progressing  Flowsheets (Taken 12/01/2020 1940)  Discharge to home or other facility with appropriate resources:   Provide appropriate patient education   Provide information on available health resources     Problem: Moderate/High Fall Risk Score >5  Goal: Patient will remain free of falls  12/02/2020 0204 by Aviva Kluver, RN  Outcome: Progressing  Flowsheets (Taken 12/02/2020 0204)  High (Greater than 13):   HIGH-Consider use of low bed   HIGH-Apply yellow "Fall Risk" arm band   HIGH-Bed alarm on at all times while patient in bed   HIGH-Visual cue at entrance to patient's room   HIGH-Utilize chair pad alarm for patient while in the chair    Problem: Neurological Deficit  Goal: Neurological status is stable or improving  Outcome: Progressing  Flowsheets (Taken 12/02/2020 0204)  Neurological status is stable or improving:   Monitor/assess/document neurological assessment (Stroke: every 4 hours)   Observe for seizure activity and initiate seizure precautions if indicated   Perform CAM Assessment   NURSING PROGRESS NOTE: STROKE UNIT    Patient Name: Kent [name] (75 y.o. male)  Admission Date: 11/30/2020 [facility] Day 0)      Recent Labs   Lab 12/01/20  1236   Sodium 132*  Potassium 4.2   Chloride 103   CO2 18*   BUN 21.0   Creatinine 1.3   EGFR 53.7   Glucose 333*   Calcium 7.9       Recent Labs   Lab 12/01/20  0431   WBC 8.44   Hgb 16.1   Hematocrit 49.8*   Platelets 128*         Patient Lines/Drains/Airways Status       Active Lines, Drains and Airways       Name Placement date Placement time Site Days    Peripheral IV 12/02/20 20 G  Anterior;Distal;Right Upper Arm 12/02/20  0001  Upper Arm  less than 1                       Braden Scale Score: 19 (12/01/20 2000)      Skin Integrity: Abrasion,Bruising  Abrasion Skin Location: L knuckle  Bruising Skin Location: BUE         Safety Checklist   1:1 Sitter N    Avasys N     ORAL CARE  Self     RESTRAINTS  N      Interpreter Services:  Does the patient require an Interpreter? N    If yes, what form of interpreter services was used? N     If family was utilized, is Tour manager form signed and in the chart? N      ASSESSMENT/PLAN:    Last BM: 01/20      Pending Orders: C diff, stool cultures    Discharge Plan: Acute rehab    Social/Family visits: None this shift    POC Update: Wife and patient    Shift Note: Pt alert, oriented x4.  MAE, Uppers are strong, lowers moderate strength. OOB 1 with one person. 1 bm this shift, diarrhea improving. IV leaking and red, removed and replaced. Bed alarm on, fall mat in place.    PPE worn in patient's room: surgical mask, gloves, gown, goggles

## 2020-12-02 NOTE — PT Progress Note (Signed)
Physical Therapy Note    [facility]   Physical Therapy Treatment  Patient:  Kent [name] MRN#:  [id number]  Unit: [facility] TOWER 6 EAST  Bed: F618/F618.01      Post Acute Care Therapy Recommendations:   Discharge Recommendations: Home with outpatient PT     Milestones to be reached to achieve recommendation: None  Anticipate achievement in n/a sessions    DME Recommended for Discharge: No additional equipment/DME recommended at this time    If Home with outpatient PT recommended discharge disposition is not available, patient will need home with supervision for mobility and care, no additional equipment.    Therapy discharge recommendations may change with patient status.  Please refer to most recent note for up-to-date recommendations.    Assessment:   Significant Findings: None    Pt received semisupine in bed. Pt demonstrates improved bed mobility and sitting balance today; no LOB without UE support or with perturbations. Pt was able to perform cognitive and motor dual tasking while ambulating without LOB - pouring ice into a separate cup and counting up by 2's individually and simultaneously. Pt also performed TUG in 24.07 seconds, indicating pt is at an increased risk of falling. With dual tasking and TUG, pt demonstrated decr cadence, shorter stride length, wider BOS, and increased steps when turning. Inc LLE instability seen with SLB when marching in place and stepping over a 6 inch box; improved with VC to reduce speed and for lateral weight shift with marching. Increased ambulation distance to 200 ft, and pt was able to perform stairs safely with appropriate technique. Will continue POC to further address balance deficits.       Treatment Activities: gait training, NMR, pt education    Educated the patient to role of physical therapy, plan of care, goals of therapy and safety with mobility and ADLs, energy conservation techniques, home safety.    Plan:   PT Frequency:  2-3x/wk    Continue plan of care.       Precautions and Contraindications:   Falls, HOH    Consult received for Norwood Levo for PT Evaluation and Treatment.  Patients medical condition is appropriate for Physical therapy intervention at this time.    Updated Medical Status/Imaging/Labs: Reviewed    Subjective: I feel much better today; it feels good to get up.      Patient's medical condition is appropriate for Physical Therapy intervention at this time.  Patient is agreeable to participation in the therapy session. Nursing clears patient for therapy.    Pain: no/denies pain        Objective:   Patient is in bed with telemetry in place.  Pt wore mask during therapy session:Yes    Cognition  Arousal/alertness: appropriate responses to stimuli  Attention span: appears intact  Orientation level: oriented x4   Memory: appears intact (able to repeat 3 words given yesterday - house, ball, red)  Following commands: follows multistep commands with repetition  Safety awareness: minimal verbal instruction  Insights: decreased awareness of deficits  Problem solving: able to problem solve independently  Behavior: Calm, cooperative, attentive  Motor planning: intact  Coordination: intact  Hand dominance: left handed    Functional Mobility  Rolling: stand by assist   Supine to Sit: stand by assist  Scooting: stand by assist  Sit to Stand: stand by assist/contact guard assist (able to perform SBA with bed rail; requires hand placement on BIL knees to assist when getting up  from chair without rails)   Stand to Sit: stand by assist    Ambulation  PMP - Progressive Mobility Protocol   PMP Activity: Step 7 - Walks out of Room (200 ft total)  Level of Assistance required: stand by assist  Pattern: R foot decreased clearance, L foot decreased clearance, R foot flat, L foot flat, decreased cadence, decreased step length, wide BOS)  Device Used: none  Stair Management: stand by assist (L rail on ascension, R rail when descending.  Step to gait pattern)  Number of Stairs: 4    Timed up and go test (TUG) (Podsiadlo &Richardson, 1991)    Score:   24.07    Assistive Device used:  2  No  If Yes, what device:  6  n/a    Cut off scores       Stroke:  >8.5 seconds       Community Dwelling Adults:  >13.5 seconds (Shumway-Cook et al 2000)       Parkinson's:  >11.5 seconds  Dairl Ponder et al, 2013)       Vestibular:  >11.1 seconds  (Whitney et al, 2004)       High risk for falls:  >14 seconds    MDC       Chronic Stroke:  2.9 seconds (Flansbjer et al, 2005)       Parkinson's:  4.85 seconds  [facility] et al, 2001)      Pt demonstrated increased difficulty with sit>stand during first trial, requiring two tries. Able to perform sit>stand without difficulty on second try. Decr cadence, step length, and inc number of steps with turn.       Balance  Static Sitting: good (improved trunk posture; decr posterior trunk lean)  Dynamic Sitting: good (able to maintain upright posture with multidirectional perturbations without LOB)  Static Standing: fair+; wide BOS  Dynamic Standing: fair (with marching, pt required VC for lateral weight shift and decr speed; SLB improved after cueing. Inc instability in SL stance on LLE)      Patient Participation: Good  Patient Endurance: Improving    Patient left with call bell within reach, all needs met, fall mat in place, chair alarm on and all questions answered. RN notified of session outcome and patient response.     Goals:  Goals  Goal Formulation: With patient  Time for Goal Acheivement: 5 visits  Pt Will Achieve Sitting Balance: 4+/5 moves/returns trunkal midpoint 1-2 inches in multiple planes,to maximize functional mobility and independence,by time of discharge (without posterior LOB)  Pt Will Demo Standing Balance: to maximize functional mobility and independence,by time of discharge,4+/5 indep, moves/returns center of grav in multiple planes 1-2"  Pt Will Ambulate: 101-150 feet,with stand by assist,to maximize  functional mobility and independence,by time of discharge  Pt Will Go Up / Down Stairs: 3-5 stairs,with stand by assist,With rail,to maximize functional mobility and independence,by time of discharge      PPE worn during session: procedural mask, goggles and gloves    Tech present: Renato Gails, DPT  PPE worn by tech: procedural mask, goggles and gloves    Drue Second, SPT 12/02/2020 1:35 PM     Supervising therapist present in the room, guiding and directing the student in one on one service to the patient.  This supervising therapist is making the skilled judgment, and is responsible for the assessment and treatment of the patient.    Renato Gails, PT PT, DPT 12/02/2020 1:39 PM  Pager# [phone]  Time of Treatment:  PT Received On: 12/02/20  Start Time: 0900  Stop Time: 0930  Time Calculation (min): 30 min  Treatment # 1 out of 5 visits

## 2020-12-03 ENCOUNTER — Telehealth (INDEPENDENT_AMBULATORY_CARE_PROVIDER_SITE_OTHER): Payer: Self-pay

## 2020-12-03 NOTE — Telephone Encounter (Signed)
Hospital D/C Outreach    Called patient to review hospital discharge and discuss follow up arrangements, but no answer at number listed. A message was left on patient's voice mail with a request to call me back.      Christyann Manolis, RN  Nurse Navigator  [facility] Management Team  [phone]

## 2020-12-06 ENCOUNTER — Telehealth: Payer: Self-pay | Admitting: Internal Medicine

## 2020-12-06 ENCOUNTER — Telehealth (INDEPENDENT_AMBULATORY_CARE_PROVIDER_SITE_OTHER): Payer: Self-pay

## 2020-12-06 LAB — VITAMIN B1, PLASMA: Vitamin B1 (Thiamine): <6 nmol/L — ABNORMAL LOW (ref 8–30)

## 2020-12-06 NOTE — Telephone Encounter (Signed)
Attempted to call pt and his wife to inform about low Thiamine level with numbers provided in Epic, but they are wrong numbers. I notified the unit director to see if we can get a right number updated in chart.

## 2020-12-06 NOTE — Telephone Encounter (Signed)
Hospital D/C Outreach    Called patient to review hospital discharge and discuss follow up arrangements, but the person hung up. Tried to call again, and the person who answered stated I have the wrong phone number. Confirmed that I dialed [phone], which is patient's listed number on file. This is the second attempt at outreach to patient, unable to reach patient.      Heron Sabins, RN  Nurse Navigator  [facility] Management Team  [phone]

## 2020-12-06 NOTE — Telephone Encounter (Signed)
Reached out to his PCP, Tisovec and discussed lab finding of Vitamin B1 < 6. Updated [place] summary and faxed to [phone] provided by Dr Wylene Simmer. Pt has an appointment with him tmr.

## 2020-12-07 DIAGNOSIS — E519 Thiamine deficiency, unspecified: Secondary | ICD-10-CM | POA: Diagnosis not present

## 2020-12-07 DIAGNOSIS — K529 Noninfective gastroenteritis and colitis, unspecified: Secondary | ICD-10-CM | POA: Diagnosis not present

## 2020-12-07 DIAGNOSIS — E1129 Type 2 diabetes mellitus with other diabetic kidney complication: Secondary | ICD-10-CM | POA: Diagnosis not present

## 2020-12-07 DIAGNOSIS — Z794 Long term (current) use of insulin: Secondary | ICD-10-CM | POA: Diagnosis not present

## 2020-12-08 DIAGNOSIS — H43821 Vitreomacular adhesion, right eye: Secondary | ICD-10-CM | POA: Diagnosis not present

## 2020-12-08 DIAGNOSIS — H43811 Vitreous degeneration, right eye: Secondary | ICD-10-CM | POA: Diagnosis not present

## 2020-12-08 DIAGNOSIS — H35371 Puckering of macula, right eye: Secondary | ICD-10-CM | POA: Diagnosis not present

## 2020-12-08 DIAGNOSIS — E113313 Type 2 diabetes mellitus with moderate nonproliferative diabetic retinopathy with macular edema, bilateral: Secondary | ICD-10-CM | POA: Diagnosis not present

## 2020-12-17 ENCOUNTER — Other Ambulatory Visit (HOSPITAL_COMMUNITY): Payer: Self-pay | Admitting: Internal Medicine

## 2020-12-17 MED FILL — MOLNUPIRAVIR 200 MG CAPS: 200 | 5 days supply | Qty: 40 | Fill #0

## 2020-12-30 DIAGNOSIS — Z794 Long term (current) use of insulin: Secondary | ICD-10-CM | POA: Diagnosis not present

## 2020-12-30 DIAGNOSIS — E119 Type 2 diabetes mellitus without complications: Secondary | ICD-10-CM | POA: Diagnosis not present

## 2021-01-19 DIAGNOSIS — E113313 Type 2 diabetes mellitus with moderate nonproliferative diabetic retinopathy with macular edema, bilateral: Secondary | ICD-10-CM | POA: Diagnosis not present

## 2021-01-19 DIAGNOSIS — H35371 Puckering of macula, right eye: Secondary | ICD-10-CM | POA: Diagnosis not present

## 2021-01-19 DIAGNOSIS — H43821 Vitreomacular adhesion, right eye: Secondary | ICD-10-CM | POA: Diagnosis not present

## 2021-01-19 DIAGNOSIS — H43813 Vitreous degeneration, bilateral: Secondary | ICD-10-CM | POA: Diagnosis not present

## 2021-02-04 ENCOUNTER — Other Ambulatory Visit: Payer: Self-pay

## 2021-02-04 ENCOUNTER — Encounter: Payer: Self-pay | Admitting: Cardiovascular Disease

## 2021-02-04 ENCOUNTER — Ambulatory Visit: Payer: Medicare Other | Admitting: Cardiovascular Disease

## 2021-02-04 VITALS — BP 130/56 | HR 82 | Ht 71.5 in | Wt 230.0 lb

## 2021-02-04 DIAGNOSIS — I10 Essential (primary) hypertension: Secondary | ICD-10-CM

## 2021-02-04 DIAGNOSIS — I2510 Atherosclerotic heart disease of native coronary artery without angina pectoris: Secondary | ICD-10-CM | POA: Diagnosis not present

## 2021-02-04 DIAGNOSIS — E782 Mixed hyperlipidemia: Secondary | ICD-10-CM | POA: Diagnosis not present

## 2021-02-04 NOTE — Patient Instructions (Signed)

## 2021-02-04 NOTE — Progress Notes (Signed)
Jason Giles Date of Birth  11/13/1946 [facility] [address]     [place], [place]  [address] [phone]  Fax  [phone]  Problem List 1. CAD - stenting 2011 ( Taxus DES in May , 2012)  2. Diabetes Mellitus  3. Essential Hypertension     Previous Notes:   Pt is doing well.  Has retired since last visit. No chest pain .  He denies any angina or dyspnea.  Dec. 8.2015:  Jason Giles is a 75 yo who I follow for CAD, HTN, hyperlipidemia.  He went to the old office today. No having any problems. Is scheduled to have a colonoscopy and cataract surgery.     Oct. 4, 2016:  Jason Giles is doing well.   No CP or dyspnea.   Has been having some left arm discomfort.   Noticed it when he got up from bed to use the bathroom.   No pain .   Possibly overworked it the day before .  Had done some heavy lifting the day of this discomfort. Has felt fine since.  Has been doing this normal activities since   Oct. 31, 2017:   Jason Giles is seen back for follow up of his coronary artery disease.  He has a history of essential hypertension and diabetes.  We discussed [name].   His brother was the tennis coach up in [place] and he knows Pana .   No CP or dyspnea. Jason Giles to the dermatology - Basal cell CA on his nose Gets eye injections every 4 weeks  ( Macular degeneration )   Dec. 6, 2018:  Jason Giles is seen today for a follow-up visit.  He has a history of coronary artery disease-status post stenting  Still having issues with his eyes  No CP , no dyspnea.    Dec. 9, 2019:  Doing well .    Hx of PCI  - no angina  Needs to work on [employer]. Today is 234 lbs.   Labs from Tisovec's office  Chol = 101 HDL = 31 LDL = 44 Trigs = 128.   Sept. 15, 2020  Jason Giles is seen today for further evaluation of his CAD and HTN  He was in the hospital following an episode of syncope in August.  Clinically I thought that his symptoms were consistent with  orthostatic hypotension.  He had not eaten or had much to drink earlier that day.  We have encouraged him to drink more fluids. We have ordered a cardiac event monitor.  He has still had some lightheadedness.  Gets dizzy when he gets up in the am .   February 02, 2020: Jason Giles is seen today for follow-up of his coronary artery disease and hypertension. He had a presyncopal episode.  It is not clear whether this was due to volume depletion or a diabetic issue.  We stopped his metoprolol and he has felt better. Exercising some ,  Walks 2 - 3 times a week .  No CP  Still eating salt  Trying out a new insulin pump  Labs from his primary medical doctor reveal a total cholesterol of 110.  His HDL is 36.  LDL is 54.  Triglyceride levels 101.  Hemoglobin A1c is 7.4.  Hemoglobin is 17.5.  February 04, 2021: Jason Giles is seen today for follow up of his CAD A month ago , he fell off a brick step,  Was thought to have a viral infection .  No CP , no dyspnea  Labs from his primary medical doctor in November, 2021 shows a total cholesterol of 94.  The HDL is 37.  LDL is 34.  Triglyceride levels 115.   Current Outpatient Medications on File Prior to Visit  Medication Sig Dispense Refill  . aspirin 81 MG tablet Take 81 mg by mouth daily.    Marland Kitchen b complex vitamins capsule Take 1 capsule by mouth daily.    Marland Kitchen COLCRYS 0.6 MG tablet Take 0.6 mg by mouth See admin instructions. Take 0.6 mg by mouth once a day for 2-3 days as directed for gout flares    . fenofibrate micronized (LOFIBRA) 134 MG capsule Take 134 mg by mouth daily before breakfast.     . insulin aspart (NOVOLOG) 100 UNIT/ML injection Inject 150 Units into the skin continuous. Per pump    . Insulin Human (INSULIN PUMP) SOLN Inject into the skin continuous. Using Novolog    . JARDIANCE 25 MG TABS tablet Take 25 mg by mouth daily.   3  . metFORMIN (GLUCOPHAGE) 850 MG tablet Take 850 mg by mouth daily with breakfast.     . multivitamin (ONE-A-DAY MEN'S) TABS  tablet Take 1 tablet by mouth daily.    . nitroGLYCERIN (NITROSTAT) 0.4 MG SL tablet Place 1 tablet (0.4 mg total) under the tongue every 5 (five) minutes as needed for chest pain. 25 tablet 0  . PLAVIX 75 MG tablet Take 75 mg by mouth Daily.     . rosuvastatin (CRESTOR) 40 MG tablet Take 40 mg by mouth daily.     No current facility-administered medications on file prior to visit.    No Known Allergies  Past Medical History:  Diagnosis Date  . Arthritis   . Colon polyp    adenomatous  . Coronary artery disease    post PTCA and stenting  . CORONARY ATHEROSCLEROSIS NATIVE CORONARY ARTERY 12/21/2009  . Diabetes mellitus   . Diverticulosis   . Hyperlipidemia   . Hypertension   . Kidney stones     Past Surgical History:  Procedure Laterality Date  . CORONARY ANGIOPLASTY WITH STENT PLACEMENT     left circumflex artery    Social History   Tobacco Use  Smoking Status Former Smoker  . Quit date: 11/13/1985  . Years since quitting: 35.2  Smokeless Tobacco Never Used    Social History   Substance and Sexual Activity  Alcohol Use Yes  . Alcohol/week: 0.0 standard drinks   Comment: Occassionally    Family History  Problem Relation Age of Onset  . Cancer Father        Brain Tumor  . Colon cancer Mother   . Colon polyps Neg Hx   . Kidney disease Neg Hx   . Diabetes Neg Hx   . Esophageal cancer Neg Hx     Reviw of Systems:  Reviewed in the HPI.  All other systems are negative.  Physical Exam: Blood pressure (!) 130/56, pulse 82, height 5' 11.5" (1.816 m), weight 230 lb (104.3 kg), SpO2 96 %.  GEN:  Well nourished, well developed in no acute distress HEENT: Normal NECK: No JVD; No carotid bruits LYMPHATICS: No lymphadenopathy CARDIAC: RRR , no murmurs, rubs, gallops RESPIRATORY:  Clear to auscultation without rales, wheezing or rhonchi  ABDOMEN: Soft, non-tender, non-distended MUSCULOSKELETAL:  No edema; No deformity  SKIN: Warm and dry NEUROLOGIC:  Alert and  oriented x 3     ECG:   Assessment / Plan:  1. CAD - stenting 2011 ( Taxus DES in May , 2012) -     No angina .  Cont plavix      2. Diabetes Mellitus  - has an insulin pump.  Managed by Dr. Osborne Casco  3.  Orthostatic hypotension:   BP is better .   3. Essential Hypertension  -   Stable   4.  Hyperlipidemia :   Managed by primary .  Labs look good .        Mertie Moores, MD  02/04/2021 8:11 AM    [facility] [place],  [place] [place], [place]  [address] Pager [phone] Phone: [phone]; Fax: [phone]

## 2021-02-22 DIAGNOSIS — E113312 Type 2 diabetes mellitus with moderate nonproliferative diabetic retinopathy with macular edema, left eye: Secondary | ICD-10-CM | POA: Diagnosis not present

## 2021-03-24 DIAGNOSIS — E119 Type 2 diabetes mellitus without complications: Secondary | ICD-10-CM | POA: Diagnosis not present

## 2021-03-24 DIAGNOSIS — Z794 Long term (current) use of insulin: Secondary | ICD-10-CM | POA: Diagnosis not present

## 2021-03-28 DIAGNOSIS — E11319 Type 2 diabetes mellitus with unspecified diabetic retinopathy without macular edema: Secondary | ICD-10-CM | POA: Diagnosis not present

## 2021-03-28 DIAGNOSIS — E1129 Type 2 diabetes mellitus with other diabetic kidney complication: Secondary | ICD-10-CM | POA: Diagnosis not present

## 2021-03-28 DIAGNOSIS — I1310 Hypertensive heart and chronic kidney disease without heart failure, with stage 1 through stage 4 chronic kidney disease, or unspecified chronic kidney disease: Secondary | ICD-10-CM | POA: Diagnosis not present

## 2021-03-28 DIAGNOSIS — Z794 Long term (current) use of insulin: Secondary | ICD-10-CM | POA: Diagnosis not present

## 2021-03-29 DIAGNOSIS — E113313 Type 2 diabetes mellitus with moderate nonproliferative diabetic retinopathy with macular edema, bilateral: Secondary | ICD-10-CM | POA: Diagnosis not present

## 2021-03-29 DIAGNOSIS — H43821 Vitreomacular adhesion, right eye: Secondary | ICD-10-CM | POA: Diagnosis not present

## 2021-03-29 DIAGNOSIS — H43811 Vitreous degeneration, right eye: Secondary | ICD-10-CM | POA: Diagnosis not present

## 2021-03-29 DIAGNOSIS — H35371 Puckering of macula, right eye: Secondary | ICD-10-CM | POA: Diagnosis not present

## 2021-04-26 DIAGNOSIS — E113312 Type 2 diabetes mellitus with moderate nonproliferative diabetic retinopathy with macular edema, left eye: Secondary | ICD-10-CM | POA: Diagnosis not present

## 2021-05-31 DIAGNOSIS — E113313 Type 2 diabetes mellitus with moderate nonproliferative diabetic retinopathy with macular edema, bilateral: Secondary | ICD-10-CM | POA: Diagnosis not present

## 2021-05-31 DIAGNOSIS — H43811 Vitreous degeneration, right eye: Secondary | ICD-10-CM | POA: Diagnosis not present

## 2021-05-31 DIAGNOSIS — H43821 Vitreomacular adhesion, right eye: Secondary | ICD-10-CM | POA: Diagnosis not present

## 2021-05-31 DIAGNOSIS — H35371 Puckering of macula, right eye: Secondary | ICD-10-CM | POA: Diagnosis not present

## 2021-06-16 DIAGNOSIS — Z794 Long term (current) use of insulin: Secondary | ICD-10-CM | POA: Diagnosis not present

## 2021-06-16 DIAGNOSIS — E119 Type 2 diabetes mellitus without complications: Secondary | ICD-10-CM | POA: Diagnosis not present

## 2021-06-29 DIAGNOSIS — E113312 Type 2 diabetes mellitus with moderate nonproliferative diabetic retinopathy with macular edema, left eye: Secondary | ICD-10-CM | POA: Diagnosis not present

## 2021-07-27 DIAGNOSIS — E113313 Type 2 diabetes mellitus with moderate nonproliferative diabetic retinopathy with macular edema, bilateral: Secondary | ICD-10-CM | POA: Diagnosis not present

## 2021-07-27 DIAGNOSIS — H43821 Vitreomacular adhesion, right eye: Secondary | ICD-10-CM | POA: Diagnosis not present

## 2021-07-27 DIAGNOSIS — H35371 Puckering of macula, right eye: Secondary | ICD-10-CM | POA: Diagnosis not present

## 2021-07-27 DIAGNOSIS — H43811 Vitreous degeneration, right eye: Secondary | ICD-10-CM | POA: Diagnosis not present

## 2021-08-14 DIAGNOSIS — E1129 Type 2 diabetes mellitus with other diabetic kidney complication: Secondary | ICD-10-CM | POA: Diagnosis not present

## 2021-08-15 DIAGNOSIS — M79675 Pain in left toe(s): Secondary | ICD-10-CM | POA: Diagnosis not present

## 2021-08-15 DIAGNOSIS — I1310 Hypertensive heart and chronic kidney disease without heart failure, with stage 1 through stage 4 chronic kidney disease, or unspecified chronic kidney disease: Secondary | ICD-10-CM | POA: Diagnosis not present

## 2021-08-15 DIAGNOSIS — N1831 Chronic kidney disease, stage 3a: Secondary | ICD-10-CM | POA: Diagnosis not present

## 2021-08-15 DIAGNOSIS — E1165 Type 2 diabetes mellitus with hyperglycemia: Secondary | ICD-10-CM | POA: Diagnosis not present

## 2021-08-17 ENCOUNTER — Ambulatory Visit: Payer: Medicare Other | Attending: Internal Medicine

## 2021-08-17 DIAGNOSIS — Z794 Long term (current) use of insulin: Secondary | ICD-10-CM | POA: Diagnosis not present

## 2021-08-17 DIAGNOSIS — Z23 Encounter for immunization: Secondary | ICD-10-CM

## 2021-08-17 DIAGNOSIS — E1165 Type 2 diabetes mellitus with hyperglycemia: Secondary | ICD-10-CM | POA: Diagnosis not present

## 2021-08-17 DIAGNOSIS — E119 Type 2 diabetes mellitus without complications: Secondary | ICD-10-CM | POA: Diagnosis not present

## 2021-08-17 DIAGNOSIS — E11319 Type 2 diabetes mellitus with unspecified diabetic retinopathy without macular edema: Secondary | ICD-10-CM | POA: Diagnosis not present

## 2021-08-17 NOTE — Progress Notes (Signed)
   Covid-19 Vaccination Clinic  Name:  HEZEKIAH VELTRE    MRN: [id number] DOB: 1946/05/07  08/17/2021  Mr. Fife was observed post Covid-19 immunization for 15 minutes without incident. He was provided with Vaccine Information Sheet and instruction to access the V-Safe system.   Mr. Costabile was instructed to call 911 with any severe reactions post vaccine: Difficulty breathing  Swelling of face and throat  A fast heartbeat  A bad rash all over body  Dizziness and weakness

## 2021-08-24 DIAGNOSIS — E113313 Type 2 diabetes mellitus with moderate nonproliferative diabetic retinopathy with macular edema, bilateral: Secondary | ICD-10-CM | POA: Diagnosis not present

## 2021-08-25 DIAGNOSIS — E113312 Type 2 diabetes mellitus with moderate nonproliferative diabetic retinopathy with macular edema, left eye: Secondary | ICD-10-CM | POA: Diagnosis not present

## 2021-08-25 DIAGNOSIS — H35372 Puckering of macula, left eye: Secondary | ICD-10-CM | POA: Diagnosis not present

## 2021-08-25 DIAGNOSIS — Z961 Presence of intraocular lens: Secondary | ICD-10-CM | POA: Diagnosis not present

## 2021-09-07 DIAGNOSIS — E119 Type 2 diabetes mellitus without complications: Secondary | ICD-10-CM | POA: Diagnosis not present

## 2021-09-07 DIAGNOSIS — E11319 Type 2 diabetes mellitus with unspecified diabetic retinopathy without macular edema: Secondary | ICD-10-CM | POA: Diagnosis not present

## 2021-09-07 DIAGNOSIS — E1165 Type 2 diabetes mellitus with hyperglycemia: Secondary | ICD-10-CM | POA: Diagnosis not present

## 2021-09-07 DIAGNOSIS — Z794 Long term (current) use of insulin: Secondary | ICD-10-CM | POA: Diagnosis not present

## 2021-09-08 DIAGNOSIS — E119 Type 2 diabetes mellitus without complications: Secondary | ICD-10-CM | POA: Diagnosis not present

## 2021-09-08 DIAGNOSIS — Z794 Long term (current) use of insulin: Secondary | ICD-10-CM | POA: Diagnosis not present

## 2021-09-08 DIAGNOSIS — E1165 Type 2 diabetes mellitus with hyperglycemia: Secondary | ICD-10-CM | POA: Diagnosis not present

## 2021-09-08 DIAGNOSIS — E11319 Type 2 diabetes mellitus with unspecified diabetic retinopathy without macular edema: Secondary | ICD-10-CM | POA: Diagnosis not present

## 2021-09-13 DIAGNOSIS — I2510 Atherosclerotic heart disease of native coronary artery without angina pectoris: Secondary | ICD-10-CM | POA: Diagnosis not present

## 2021-09-13 DIAGNOSIS — E1129 Type 2 diabetes mellitus with other diabetic kidney complication: Secondary | ICD-10-CM | POA: Diagnosis not present

## 2021-09-13 DIAGNOSIS — I1310 Hypertensive heart and chronic kidney disease without heart failure, with stage 1 through stage 4 chronic kidney disease, or unspecified chronic kidney disease: Secondary | ICD-10-CM | POA: Diagnosis not present

## 2021-09-13 DIAGNOSIS — Z794 Long term (current) use of insulin: Secondary | ICD-10-CM | POA: Diagnosis not present

## 2021-09-16 DIAGNOSIS — E1129 Type 2 diabetes mellitus with other diabetic kidney complication: Secondary | ICD-10-CM | POA: Diagnosis not present

## 2021-09-19 DIAGNOSIS — L821 Other seborrheic keratosis: Secondary | ICD-10-CM | POA: Diagnosis not present

## 2021-09-19 DIAGNOSIS — D225 Melanocytic nevi of trunk: Secondary | ICD-10-CM | POA: Diagnosis not present

## 2021-09-19 DIAGNOSIS — Z85828 Personal history of other malignant neoplasm of skin: Secondary | ICD-10-CM | POA: Diagnosis not present

## 2021-09-19 DIAGNOSIS — L570 Actinic keratosis: Secondary | ICD-10-CM | POA: Diagnosis not present

## 2021-09-21 DIAGNOSIS — H43811 Vitreous degeneration, right eye: Secondary | ICD-10-CM | POA: Diagnosis not present

## 2021-09-21 DIAGNOSIS — E113313 Type 2 diabetes mellitus with moderate nonproliferative diabetic retinopathy with macular edema, bilateral: Secondary | ICD-10-CM | POA: Diagnosis not present

## 2021-09-21 DIAGNOSIS — H43821 Vitreomacular adhesion, right eye: Secondary | ICD-10-CM | POA: Diagnosis not present

## 2021-09-21 DIAGNOSIS — H35371 Puckering of macula, right eye: Secondary | ICD-10-CM | POA: Diagnosis not present

## 2021-09-29 ENCOUNTER — Encounter: Payer: Self-pay | Admitting: Internal Medicine

## 2021-10-10 ENCOUNTER — Telehealth: Payer: Self-pay | Admitting: Cardiovascular Disease

## 2021-10-10 NOTE — Telephone Encounter (Signed)
Returned call to Pt's wife.  Pt and wife arrived at hotel last night at about 8:00 pm and went to bed.  Pt then states that he had chest pain that he attributed to indigestion.  Per wife pain was continuous for a couple of hours-Pt also had some mild shortness of breath.  Pt was unable to take BP/pulse or any nitroglycerin because they were away from home.  Chest pain/sob has resolved at this time.  Pt is a type 1 diabetic on insulin pump.  Ok per Dr. Acie Fredrickson to take 7 day hold spot this Friday 12/2.  Pt/wife aware.

## 2021-10-10 NOTE — Telephone Encounter (Signed)
Pt sent message via Mychart to the Scheduling pool:     Happened last night for about 2 hours. No discomfort now. It was continuous. Have not taken nitroglycerin. I believe the prescription I have for nitroglycerin is old. Had some sob last night. No shortness of breath now. We were out of town and just got back home. Called the office for appt but they had not called back.    ----- Message -----      From:Shana S      Sent:10/10/2021  1:14 PM EST        IR:CVEL Jason Giles   Subject:Appointment Request  Good Afternoon Jason Giles,  Can you tell me a little more about your chest Discomfort?    1. Are you having CP right now?   2. Are you experiencing any other symptoms (ex. SOB, nausea, vomiting, sweating)?   3. How long have you been experiencing CP?   4. Is your CP continuous or coming and going?   5. Have you taken Nitroglycerin?  ?    ----- Message -----      From:Jason Giles      Sent:10/10/2021  1:06 PM EST        FY:BOFBPZ Nahser, MD   Subject:Appointment Request  Appointment Request From: Jason Giles  With Provider: Mertie Moores, MD [place]  Preferred Date Range: Any  Preferred Times: Any Time  Reason for visit: Office Visit  Comments: Chest discomfort

## 2021-10-14 ENCOUNTER — Encounter: Payer: Self-pay | Admitting: Cardiovascular Disease

## 2021-10-14 ENCOUNTER — Telehealth: Payer: Self-pay

## 2021-10-14 ENCOUNTER — Ambulatory Visit: Payer: Medicare Other | Admitting: Cardiovascular Disease

## 2021-10-14 ENCOUNTER — Other Ambulatory Visit: Payer: Self-pay

## 2021-10-14 VITALS — BP 160/80 | HR 70 | Ht 71.5 in | Wt 238.2 lb

## 2021-10-14 DIAGNOSIS — Z125 Encounter for screening for malignant neoplasm of prostate: Secondary | ICD-10-CM | POA: Diagnosis not present

## 2021-10-14 DIAGNOSIS — I10 Essential (primary) hypertension: Secondary | ICD-10-CM | POA: Diagnosis not present

## 2021-10-14 DIAGNOSIS — E1165 Type 2 diabetes mellitus with hyperglycemia: Secondary | ICD-10-CM | POA: Diagnosis not present

## 2021-10-14 DIAGNOSIS — Z955 Presence of coronary angioplasty implant and graft: Secondary | ICD-10-CM

## 2021-10-14 DIAGNOSIS — E782 Mixed hyperlipidemia: Secondary | ICD-10-CM | POA: Diagnosis not present

## 2021-10-14 DIAGNOSIS — I200 Unstable angina: Secondary | ICD-10-CM

## 2021-10-14 DIAGNOSIS — E7800 Pure hypercholesterolemia, unspecified: Secondary | ICD-10-CM | POA: Diagnosis not present

## 2021-10-14 MED ORDER — METOPROLOL TARTRATE 100 MG PO TABS: ORAL_TABLET | ORAL | 0 refills | Status: DC

## 2021-10-14 MED ORDER — NITROGLYCERIN 0.4 MG SL SUBL: 0.4000 mg | SUBLINGUAL_TABLET | SUBLINGUAL | 1 refills | Status: DC | PRN

## 2021-10-14 NOTE — Patient Instructions (Addendum)
Medication Instructions:  Your physician recommends that you continue on your current medications as directed. Please refer to the Current Medication list given to you today.  *If you need a refill on your cardiac medications before your next appointment, please call your pharmacy*   Lab Work: TODAY: BMP If you have labs (blood work) drawn today and your tests are completely normal, you will receive your results only by: [place] (if you have MyChart) OR A paper copy in the mail If you have any lab test that is abnormal or we need to change your treatment, we will call you to review the results.   Testing/Procedures: Your physician has requested that you have cardiac CT. Cardiac computed tomography (CT) is a painless test that uses an x-ray machine to take clear, detailed pictures of your heart.  Please follow instruction sheet as given.     Follow-Up: At [facility], you and your health needs are our priority.  As part of our continuing mission to provide you with exceptional heart care, we have created designated Provider Care Teams.  These Care Teams include your primary Cardiologist (physician) and Advanced Practice Providers (APPs -  Physician Assistants and Nurse Practitioners) who all work together to provide you with the care you need, when you need it.  We recommend signing up for the patient portal called "MyChart".  Sign up information is provided on this After Visit Summary.  MyChart is used to connect with patients for Virtual Visits (Telemedicine).  Patients are able to view lab/test results, encounter notes, upcoming appointments, etc.  Non-urgent messages can be sent to your provider as well.   To learn more about what you can do with MyChart, go to [email or web address].    Your next appointment:   3 month(s)  The format for your next appointment:   In Person  Provider:   Robbie Lis, PA-C or Richardson Dopp, PA-C   Other Instructions   Your cardiac CT  will be scheduled at one of the below locations:   [facility] [address] [place],  [address] [phone]   At [facility], please arrive at the [facility] main entrance (entrance A) of [facility] 30 minutes prior to test start time. You can use the FREE valet parking offered at the main entrance (encouraged to control the heart rate for the test) Proceed to the [facility] Radiology Department (first floor) to check-in and test prep.  If scheduled at [facility], please arrive 15 mins early for check-in and test prep.  Please follow these instructions carefully (unless otherwise directed):  Hold all erectile dysfunction medications at least 3 days (72 hrs) prior to test.  On the Night Before the Test: Be sure to Drink plenty of water. Do not consume any caffeinated/decaffeinated beverages or chocolate 12 hours prior to your test. Do not take any antihistamines 12 hours prior to your test.   On the Day of the Test: Drink plenty of water until 1 hour prior to the test. Do not eat any food 4 hours prior to the test. You may take your regular medications prior to the test.  Take metoprolol (Lopressor) 100 mg two hours prior to test.         After the Test: Drink plenty of water. After receiving IV contrast, you may experience a mild flushed feeling. This is normal. On occasion, you may experience a mild rash up to 24 hours after the test. This is  not dangerous. If this occurs, you can take Benadryl 25 mg and increase your fluid intake. If you experience trouble breathing, this can be serious. If it is severe call 911 IMMEDIATELY. If it is mild, please call our office. If you take any of these medications: Metformin please do not take 48 hours after completing test unless otherwise instructed.  Please allow 2-4 weeks for scheduling of routine cardiac CTs. Some insurance companies require a pre-authorization which  may delay scheduling of this test.   For non-scheduling related questions, please contact the cardiac imaging nurse navigator should you have any questions/concerns: Marchia Bond, Cardiac Imaging Nurse Navigator Gordy Clement, Cardiac Imaging Nurse Navigator [facility] Direct Office Dial: [phone]   For scheduling needs, including cancellations and rescheduling, please call [place], [phone].

## 2021-10-14 NOTE — Telephone Encounter (Signed)
Called left a message for medical records to fax over copy of pt 10/13/21 labs to Attn: Dr. Acie Fredrickson.  Pt is ordered to have a Cardiac CT and will need a recent BMP.  Pt unsure if BMP was drawn, pt advised if lab wasn't drawn will have to come in prior to CT.

## 2021-10-14 NOTE — Progress Notes (Signed)
Darrow Bussing Date of Birth  11/13/1946 [facility] [address]     [place], [place]  [address] [phone]  Fax  [phone]  Problem List 1. CAD - stenting 2011 ( Taxus DES in May , 2012)  2. Diabetes Mellitus  3. Essential Hypertension     Previous Notes:   Pt is doing well.  Has retired since last visit. No chest pain .  He denies any angina or dyspnea.  Dec. 8.2015:  Jerame is a 75 yo who I follow for CAD, HTN, hyperlipidemia.  He went to the old office today. No having any problems. Is scheduled to have a colonoscopy and cataract surgery.     Oct. 4, 2016:  Koki is doing well.   No CP or dyspnea.   Has been having some left arm discomfort.   Noticed it when he got up from bed to use the bathroom.   No pain .   Possibly overworked it the day before .  Had done some heavy lifting the day of this discomfort. Has felt fine since.  Has been doing this normal activities since   Oct. 31, 2017:   Dory is seen back for follow up of his coronary artery disease.  He has a history of essential hypertension and diabetes.  We discussed [name].   His brother was the tennis coach up in [place] and he knows [place] .   No CP or dyspnea. Colman Cater to the dermatology - Basal cell CA on his nose Gets eye injections every 4 weeks  ( Macular degeneration )   Dec. 6, 2018:  Mikyle is seen today for a follow-up visit.  He has a history of coronary artery disease-status post stenting  Still having issues with his eyes  No CP , no dyspnea.    Dec. 9, 2019:  Doing well .    Hx of PCI  - no angina  Needs to work on [employer]. Today is 234 lbs.   Labs from Tisovec's office  Chol = 101 HDL = 31 LDL = 44 Trigs = 128.   Sept. 15, 2020  Scout is seen today for further evaluation of his CAD and HTN  He was in the hospital following an episode of syncope in August.  Clinically I thought that his symptoms were consistent with  orthostatic hypotension.  He had not eaten or had much to drink earlier that day.  We have encouraged him to drink more fluids. We have ordered a cardiac event monitor.  He has still had some lightheadedness.  Gets dizzy when he gets up in the am .   February 02, 2020: Tayvion is seen today for follow-up of his coronary artery disease and hypertension. He had a presyncopal episode.  It is not clear whether this was due to volume depletion or a diabetic issue.  We stopped his metoprolol and he has felt better. Exercising some ,  Walks 2 - 3 times a week .  No CP  Still eating salt  Trying out a new insulin pump  Labs from his primary medical doctor reveal a total cholesterol of 110.  His HDL is 36.  LDL is 54.  Triglyceride levels 101.  Hemoglobin A1c is 7.4.  Hemoglobin is 17.5.  February 04, 2021: Emidio is seen today for follow up of his CAD A month ago , he fell off a brick step,  Was thought to have a viral infection .  No CP , no dyspnea  Labs from his primary medical doctor in November, 2021 shows a total cholesterol of 94.  The HDL is 37.  LDL is 34.  Triglyceride levels 115.   Dec. 2, 2022 Kazmir is seen for follow up of his CAD  Seen with wife, [name]  Occurred 5 days ago Uneasy feeling in his chest when  he went to bed Not exertional , Lasted for several hours  Did not feel like indigestion  Has been outside working in the yard this week without any chest pain since that time  Has had more fatigue for the past several months .  Has not been exercisng all that regularly for the past year or so    Current Outpatient Medications on File Prior to Visit  Medication Sig Dispense Refill   aspirin 81 MG tablet Take 81 mg by mouth daily.     b complex vitamins capsule Take 1 capsule by mouth daily.     COLCRYS 0.6 MG tablet Take 0.6 mg by mouth See admin instructions. Take 0.6 mg by mouth once a day for 2-3 days as directed for gout flares     fenofibrate micronized (LOFIBRA) 134 MG  capsule Take 134 mg by mouth daily before breakfast.      insulin aspart (NOVOLOG) 100 UNIT/ML injection Inject 150 Units into the skin continuous. Per pump     Insulin Human (INSULIN PUMP) SOLN Inject into the skin continuous. Using Novolog     JARDIANCE 25 MG TABS tablet Take 25 mg by mouth daily.   3   metFORMIN (GLUCOPHAGE) 850 MG tablet Take 850 mg by mouth daily with breakfast.      Molnupiravir 200 MG CAPS TAKE 4 CAPSULES BY MOUTH EVERY 12 HOURS FOR 5 DAYS 40 capsule 0   multivitamin (ONE-A-DAY MEN'S) TABS tablet Take 1 tablet by mouth daily.     nitroGLYCERIN (NITROSTAT) 0.4 MG SL tablet Place 1 tablet (0.4 mg total) under the tongue every 5 (five) minutes as needed for chest pain. 25 tablet 0   PLAVIX 75 MG tablet Take 75 mg by mouth Daily.      rosuvastatin (CRESTOR) 40 MG tablet Take 40 mg by mouth daily.     No current facility-administered medications on file prior to visit.    No Known Allergies  Past Medical History:  Diagnosis Date   Arthritis    Colon polyp    adenomatous   Coronary artery disease    post PTCA and stenting   CORONARY ATHEROSCLEROSIS NATIVE CORONARY ARTERY 12/21/2009   Diabetes mellitus    Diverticulosis    Hyperlipidemia    Hypertension    Kidney stones     Past Surgical History:  Procedure Laterality Date   CORONARY ANGIOPLASTY WITH STENT PLACEMENT     left circumflex artery    Social History   Tobacco Use  Smoking Status Former   Types: Cigarettes   Quit date: 11/13/1985   Years since quitting: 35.9  Smokeless Tobacco Never    Social History   Substance and Sexual Activity  Alcohol Use Yes   Alcohol/week: 0.0 standard drinks   Comment: Occassionally    Family History  Problem Relation Age of Onset   Cancer Father        Brain Tumor   Colon cancer Mother    Colon polyps Neg Hx    Kidney disease Neg Hx    Diabetes Neg Hx    Esophageal cancer Neg Hx  Reviw of Systems:  Reviewed in the HPI.  All other systems are  negative.   Physical Exam: There were no vitals taken for this visit.  GEN:  Well nourished, well developed in no acute distress HEENT: Normal NECK: No JVD; No carotid bruits LYMPHATICS: No lymphadenopathy CARDIAC: RRR , no murmurs, rubs, gallops RESPIRATORY:  Clear to auscultation without rales, wheezing or rhonchi  ABDOMEN: Soft, non-tender, non-distended MUSCULOSKELETAL:  No edema; No deformity  SKIN: Warm and dry NEUROLOGIC:  Alert and oriented x 3  ECG:   Assessment / Plan:   1. CAD - stenting 2011 ( Taxus DES in May , 2012) -      Had some recurrent cp / pressure 5 days ago Does not complain about much in general  Will schedule  a Coronary CT angio If it shows significant CAD, he will need a cath      2. Diabetes Mellitus  - has an insulin pump.  Managed by Dr. Osborne Casco He has in insulin pump,  has frequent episodes of low glucose and has to eat rescue carbs when his glucose drops   3.  Orthostatic hypotension:    .   3. Essential Hypertension  -      4.  Hyperlipidemia :   .   Follow up with [place] 3 months        Mertie Moores, MD  10/14/2021 7:17 AM    [place],  [place], [place]  [address] Pager [phone] Phone: [phone]; Fax: [phone]

## 2021-10-18 DIAGNOSIS — E1129 Type 2 diabetes mellitus with other diabetic kidney complication: Secondary | ICD-10-CM | POA: Diagnosis not present

## 2021-10-19 ENCOUNTER — Encounter: Payer: Self-pay | Admitting: Cardiovascular Disease

## 2021-10-19 DIAGNOSIS — H43811 Vitreous degeneration, right eye: Secondary | ICD-10-CM | POA: Diagnosis not present

## 2021-10-19 DIAGNOSIS — E113313 Type 2 diabetes mellitus with moderate nonproliferative diabetic retinopathy with macular edema, bilateral: Secondary | ICD-10-CM | POA: Diagnosis not present

## 2021-10-19 DIAGNOSIS — H35371 Puckering of macula, right eye: Secondary | ICD-10-CM | POA: Diagnosis not present

## 2021-10-19 DIAGNOSIS — H43821 Vitreomacular adhesion, right eye: Secondary | ICD-10-CM | POA: Diagnosis not present

## 2021-10-21 DIAGNOSIS — R82998 Other abnormal findings in urine: Secondary | ICD-10-CM | POA: Diagnosis not present

## 2021-10-21 DIAGNOSIS — I10 Essential (primary) hypertension: Secondary | ICD-10-CM | POA: Diagnosis not present

## 2021-10-21 DIAGNOSIS — E1165 Type 2 diabetes mellitus with hyperglycemia: Secondary | ICD-10-CM | POA: Diagnosis not present

## 2021-10-21 DIAGNOSIS — E1129 Type 2 diabetes mellitus with other diabetic kidney complication: Secondary | ICD-10-CM | POA: Diagnosis not present

## 2021-10-21 DIAGNOSIS — Z0000 Encounter for general adult medical examination without abnormal findings: Secondary | ICD-10-CM | POA: Diagnosis not present

## 2021-10-21 DIAGNOSIS — E11319 Type 2 diabetes mellitus with unspecified diabetic retinopathy without macular edema: Secondary | ICD-10-CM | POA: Diagnosis not present

## 2021-11-03 DIAGNOSIS — Z1212 Encounter for screening for malignant neoplasm of rectum: Secondary | ICD-10-CM | POA: Diagnosis not present

## 2021-11-15 NOTE — Telephone Encounter (Signed)
Called pt BMP scheduled for 11/17/21 for upcoming Cardiac Ct on 11/22/21.  Pt expressed that labs were drawn at PCP office.  RN notified pt that labs have to be within the last 30 days so new labs will be need to be drawn.  Pt verbalized understanding.

## 2021-11-16 DIAGNOSIS — H43821 Vitreomacular adhesion, right eye: Secondary | ICD-10-CM | POA: Diagnosis not present

## 2021-11-16 DIAGNOSIS — H35371 Puckering of macula, right eye: Secondary | ICD-10-CM | POA: Diagnosis not present

## 2021-11-16 DIAGNOSIS — E113313 Type 2 diabetes mellitus with moderate nonproliferative diabetic retinopathy with macular edema, bilateral: Secondary | ICD-10-CM | POA: Diagnosis not present

## 2021-11-16 DIAGNOSIS — H43811 Vitreous degeneration, right eye: Secondary | ICD-10-CM | POA: Diagnosis not present

## 2021-11-17 ENCOUNTER — Other Ambulatory Visit: Payer: Self-pay

## 2021-11-17 ENCOUNTER — Other Ambulatory Visit: Payer: Medicare Other | Admitting: *Deleted

## 2021-11-17 DIAGNOSIS — Z794 Long term (current) use of insulin: Secondary | ICD-10-CM | POA: Diagnosis not present

## 2021-11-17 DIAGNOSIS — E119 Type 2 diabetes mellitus without complications: Secondary | ICD-10-CM | POA: Diagnosis not present

## 2021-11-17 DIAGNOSIS — I200 Unstable angina: Secondary | ICD-10-CM | POA: Diagnosis not present

## 2021-11-17 DIAGNOSIS — E11319 Type 2 diabetes mellitus with unspecified diabetic retinopathy without macular edema: Secondary | ICD-10-CM | POA: Diagnosis not present

## 2021-11-17 DIAGNOSIS — E1165 Type 2 diabetes mellitus with hyperglycemia: Secondary | ICD-10-CM | POA: Diagnosis not present

## 2021-11-18 DIAGNOSIS — E1129 Type 2 diabetes mellitus with other diabetic kidney complication: Secondary | ICD-10-CM | POA: Diagnosis not present

## 2021-11-18 LAB — BASIC METABOLIC PANEL
BUN/Creatinine Ratio: 15 (ref 10–24)
BUN: 24 mg/dL (ref 8–27)
CO2: 21 mmol/L (ref 20–29)
Calcium: 9.3 mg/dL (ref 8.6–10.2)
Chloride: 104 mmol/L (ref 96–106)
Creatinine, Ser: 1.57 mg/dL — ABNORMAL HIGH (ref 0.76–1.27)
Glucose: 129 mg/dL — ABNORMAL HIGH (ref 70–99)
Potassium: 4 mmol/L (ref 3.5–5.2)
Sodium: 142 mmol/L (ref 134–144)
eGFR: 45 mL/min/{1.73_m2} — ABNORMAL LOW (ref >59)

## 2021-11-22 ENCOUNTER — Telehealth (HOSPITAL_COMMUNITY): Payer: Self-pay | Admitting: *Deleted

## 2021-11-22 NOTE — Telephone Encounter (Signed)
Reaching out to patient to offer assistance regarding upcoming cardiac imaging study; pt verbalizes understanding of appt date/time, parking situation and where to check in, pre-test NPO status and medications ordered, and verified current allergies; name and call back number provided for further questions should they arise  Jason Clement RN Navigator Cardiac Imaging [facility] [phone] office [phone] cell  Patient to take 100mg  metoprolol tartrate two hours prior to cardiac CT Scan. He is aware to arrive at 2:45pm for his 3:15pm scan.

## 2021-11-23 ENCOUNTER — Ambulatory Visit (HOSPITAL_COMMUNITY)
Admission: RE | Admit: 2021-11-23 | Discharge: 2021-11-23 | Disposition: A | Discharge status: Home / Self Care | Payer: Medicare Other | Source: Ambulatory Visit | Attending: Cardiovascular Disease | Admitting: Cardiovascular Disease

## 2021-11-23 ENCOUNTER — Ambulatory Visit (HOSPITAL_COMMUNITY)
Admission: RE | Admit: 2021-11-23 | Discharge: 2021-11-23 | Disposition: A | Discharge status: Home / Self Care | Payer: Medicare Other | Source: Ambulatory Visit | Attending: Internal Medicine | Admitting: Internal Medicine

## 2021-11-23 ENCOUNTER — Other Ambulatory Visit: Payer: Self-pay

## 2021-11-23 ENCOUNTER — Encounter (HOSPITAL_COMMUNITY): Payer: Self-pay

## 2021-11-23 DIAGNOSIS — I200 Unstable angina: Secondary | ICD-10-CM | POA: Insufficient documentation

## 2021-11-23 DIAGNOSIS — I25110 Atherosclerotic heart disease of native coronary artery with unstable angina pectoris: Secondary | ICD-10-CM | POA: Insufficient documentation

## 2021-11-23 DIAGNOSIS — Z955 Presence of coronary angioplasty implant and graft: Secondary | ICD-10-CM | POA: Insufficient documentation

## 2021-11-23 DIAGNOSIS — R931 Abnormal findings on diagnostic imaging of heart and coronary circulation: Secondary | ICD-10-CM | POA: Insufficient documentation

## 2021-11-23 DIAGNOSIS — I517 Cardiomegaly: Secondary | ICD-10-CM | POA: Insufficient documentation

## 2021-11-23 DIAGNOSIS — E782 Mixed hyperlipidemia: Secondary | ICD-10-CM | POA: Insufficient documentation

## 2021-11-23 DIAGNOSIS — I2510 Atherosclerotic heart disease of native coronary artery without angina pectoris: Secondary | ICD-10-CM | POA: Insufficient documentation

## 2021-11-23 DIAGNOSIS — I700 Atherosclerosis of aorta: Secondary | ICD-10-CM | POA: Insufficient documentation

## 2021-11-23 MED ORDER — IOHEXOL 350 MG/ML SOLN
95.0000 mL | Freq: Once | INTRAVENOUS | Status: AC | PRN
  Administered 2021-11-23: 95 mL via INTRAVENOUS

## 2021-11-23 MED ORDER — METOPROLOL TARTRATE 5 MG/5ML IV SOLN: 2.5000 mg | Freq: Once | INTRAVENOUS | Status: DC

## 2021-11-23 MED ORDER — NITROGLYCERIN 0.4 MG SL SUBL: 0.8000 mg | SUBLINGUAL_TABLET | Freq: Once | SUBLINGUAL | Status: AC

## 2021-11-23 MED ORDER — NITROGLYCERIN 0.4 MG SL SUBL
SUBLINGUAL_TABLET | SUBLINGUAL | Status: AC
  Administered 2021-11-23: 0.8 mg via SUBLINGUAL
  Filled 2021-11-23: qty 2

## 2021-11-23 MED ORDER — METOPROLOL TARTRATE 5 MG/5ML IV SOLN
INTRAVENOUS | Status: AC
  Filled 2021-11-23: qty 10

## 2021-11-24 ENCOUNTER — Encounter: Payer: Self-pay | Admitting: Cardiovascular Disease

## 2021-11-24 DIAGNOSIS — I2510 Atherosclerotic heart disease of native coronary artery without angina pectoris: Secondary | ICD-10-CM | POA: Diagnosis not present

## 2021-11-24 DIAGNOSIS — R931 Abnormal findings on diagnostic imaging of heart and coronary circulation: Secondary | ICD-10-CM | POA: Diagnosis not present

## 2021-11-24 NOTE — Progress Notes (Signed)
Pt and his wife, both, have been made aware of pt's CT results. Pt has been scheduled to see Dr. Acie Fredrickson, 11/28/21 @ 11:50, per Dr. Acie Fredrickson. Pt does have NTG on hand.

## 2021-11-27 ENCOUNTER — Encounter: Payer: Self-pay | Admitting: Cardiovascular Disease

## 2021-11-27 NOTE — H&P (View-Only) (Signed)
Jason Giles Date of Birth  11-12-1946 [facility] [address]     [place], [place]  [address] [phone]  Fax  [phone]  Problem List 1. CAD - stenting 2011 ( Taxus DES in May , 2012)  2. Diabetes Mellitus  3. Essential Hypertension     Previous Notes:   Pt is doing well.  Has retired since last visit. No chest pain .  He denies any angina or dyspnea.  Dec. 8.2015:  Jason Giles is a 76 yo who I follow for CAD, HTN, hyperlipidemia.  He went to the old office today. No having any problems. Is scheduled to have a colonoscopy and cataract surgery.     Oct. 4, 2016:  Jason Giles is doing well.   No CP or dyspnea.   Has been having some left arm discomfort.   Noticed it when he got up from bed to use the bathroom.   No pain .   Possibly overworked it the day before .  Had done some heavy lifting the day of this discomfort. Has felt fine since.  Has been doing this normal activities since   Oct. 31, 2017:   Jason Giles is seen back for follow up of his coronary artery disease.  He has a history of essential hypertension and diabetes.  We discussed [name].   His brother was the tennis coach up in [place] and he knows [place] .   No CP or dyspnea. Jason Giles to the dermatology - Basal cell CA on his nose Gets eye injections every 4 weeks  ( Macular degeneration )   Dec. 6, 2018:  Jason Giles is seen today for a follow-up visit.  He has a history of coronary artery disease-status post stenting  Still having issues with his eyes  No CP , no dyspnea.    Dec. 9, 2019:  Doing well .    Hx of PCI  - no angina  Needs to work on [employer]. Today is 234 lbs.   Labs from Tisovec's office  Chol = 101 HDL = 31 LDL = 44 Trigs = 128.   Sept. 15, 2020  Jason Giles is seen today for further evaluation of his CAD and HTN  He was in the hospital following an episode of syncope in August.  Clinically I thought that his symptoms were consistent with  orthostatic hypotension.  He had not eaten or had much to drink earlier that day.  We have encouraged him to drink more fluids. We have ordered a cardiac event monitor.  He has still had some lightheadedness.  Gets dizzy when he gets up in the am .   February 02, 2020: Jason Giles is seen today for follow-up of his coronary artery disease and hypertension. He had a presyncopal episode.  It is not clear whether this was due to volume depletion or a diabetic issue.  We stopped his metoprolol and he has felt better. Exercising some ,  Walks 2 - 3 times a week .  No CP  Still eating salt  Trying out a new insulin pump  Labs from his primary medical doctor reveal a total cholesterol of 110.  His HDL is 36.  LDL is 54.  Triglyceride levels 101.  Hemoglobin A1c is 7.4.  Hemoglobin is 17.5.  February 04, 2021: Jason Giles is seen today for follow up of his CAD A month ago , he fell off a brick step,  Was thought to have a viral infection .  No CP , no dyspnea  Labs from his primary medical doctor in November, 2021 shows a total cholesterol of 94.  The HDL is 37.  LDL is 34.  Triglyceride levels 115.   Dec. 2, 2022 Jason Giles is seen for follow up of his CAD  Seen with wife, [name]  Occurred 5 days ago Uneasy feeling in his chest when  he went to bed Not exertional , Lasted for several hours  Did not feel like indigestion  Has been outside working in the yard this week without any chest pain since that time  Has had more fatigue for the past several months .  Has not been exercisng all that regularly for the past year or so    Jan. 16, 2023: Jason Giles is seen today for follow up of some episodes of chest pain .  Coronary artery CT angio showed a CAC score of 4147. LM - 50% mid-distal stenosis LAD - severe calcified stenosis LCx-  severe mid LCx disease  RCA - moderate prox - mid disease  Has not had any further episodes of CP since the original CP several months ago .   He has a history of coronary artery  disease.  In 2006 we placed a stent in the left circumflex artery ( 2.75 x 12 mm quantum [place]) .  He had moderate irregularities elsewhere which were treated medically.  Has 2 tine pulmonary nodules  He is here to schedule cath   No regular exercise. Does yard work on most days  so he's is very active.    Current Outpatient Medications on File Prior to Visit  Medication Sig Dispense Refill   aspirin 81 MG tablet Take 81 mg by mouth daily.     b complex vitamins capsule Take 1 capsule by mouth daily.     COLCRYS 0.6 MG tablet Take 0.6 mg by mouth See admin instructions. Take 0.6 mg by mouth once a day for 2-3 days as directed for gout flares     fenofibrate micronized (LOFIBRA) 134 MG capsule Take 134 mg by mouth daily before breakfast.      insulin aspart (NOVOLOG) 100 UNIT/ML injection Inject 150 Units into the skin continuous. Per pump     Insulin Human (INSULIN PUMP) SOLN Inject into the skin continuous. Using Novolog     JARDIANCE 25 MG TABS tablet Take 25 mg by mouth daily.   3   metFORMIN (GLUCOPHAGE) 850 MG tablet Take 850 mg by mouth daily with breakfast.      multivitamin (ONE-A-DAY MEN'S) TABS tablet Take 1 tablet by mouth daily.     nitroGLYCERIN (NITROSTAT) 0.4 MG SL tablet Place 1 tablet (0.4 mg total) under the tongue every 5 (five) minutes as needed for chest pain. 25 tablet 1   PLAVIX 75 MG tablet Take 75 mg by mouth Daily.      rosuvastatin (CRESTOR) 40 MG tablet Take 40 mg by mouth daily.     tobramycin (TOBREX) 0.3 % ophthalmic solution In Eye(s)     No current facility-administered medications on file prior to visit.    No Known Allergies  Past Medical History:  Diagnosis Date   Arthritis    Colon polyp    adenomatous   Coronary artery disease    post PTCA and stenting   CORONARY ATHEROSCLEROSIS NATIVE CORONARY ARTERY 12/21/2009   Diabetes mellitus    Diverticulosis    Hyperlipidemia    Hypertension    Kidney stones     Past Surgical  History:   Procedure Laterality Date   CORONARY ANGIOPLASTY WITH STENT PLACEMENT     left circumflex artery    Social History   Tobacco Use  Smoking Status Former   Types: Cigarettes   Quit date: 11/13/1985   Years since quitting: 36.0  Smokeless Tobacco Never    Social History   Substance and Sexual Activity  Alcohol Use Yes   Alcohol/week: 0.0 standard drinks   Comment: Occassionally    Family History  Problem Relation Age of Onset   Cancer Father        Brain Tumor   Colon cancer Mother    Colon polyps Neg Hx    Kidney disease Neg Hx    Diabetes Neg Hx    Esophageal cancer Neg Hx     Reviw of Systems:  Reviewed in the HPI.  All other systems are negative.   Physical Exam: Blood pressure 138/70, pulse 83, height 5' 11.5" (1.816 m), weight 230 lb 12.8 oz (104.7 kg), SpO2 95 %.  GEN:  Well nourished, well developed in no acute distress HEENT: Normal NECK: No JVD; No carotid bruits LYMPHATICS: No lymphadenopathy CARDIAC: RRR , no murmurs, rubs, gallops RESPIRATORY:  Clear to auscultation without rales, wheezing or rhonchi  ABDOMEN: Soft, non-tender, non-distended MUSCULOSKELETAL:  No edema; No deformity  SKIN: Warm and dry NEUROLOGIC:  Alert and oriented x 3   ECG:   Jan. 16, 2023:   NSR .  No ST or T wave changes    Assessment / Plan:   1. CAD - stenting 2006 ( Taxus DES in May , 2012) -     He now has LM and prox  LM disease  Had 1 episode of cp,  none in a while coronary CT angiogram reveals left main disease, proximal LAD disease.  I think her best solution is to proceed with heart catheterization.  We have discussed the risk, benefits, options of heart catheterization.  He understands and agrees to proceed.  He is on aspirin and Plavix.  In looking at his CT scan, I suspect that he may need coronary artery bypass grafting.  We will hold his Plavix for now and continue aspirin 81 mg a day.     2. Diabetes Mellitus  - has an insulin pump.  Managed by Dr.  Osborne Casco.  He is on metformin.  We will hold his metformin 2 days before heart cath.   3.  Orthostatic hypotension:    .   3. Essential Hypertension  -    blood pressure is well controlled.  4.  Hyperlipidemia :   .   Follow up with Richardson Dopp, Pa as scheduled in March        Mertie Moores, MD  11/28/2021 11:25 AM    [place],  [place], [place]  [address] Pager [phone] Phone: [phone]; Fax: [phone]

## 2021-11-27 NOTE — Progress Notes (Signed)
Jason Giles Date of Birth  [birth year] [facility] / [facility] [address]     [place], [place]  [address] [phone]  Fax  [phone]  Problem List 1. CAD - stenting 2011 ( Taxus DES in May , 2012)  2. Diabetes Mellitus  3. Essential Hypertension     Previous Notes:   Pt is doing well.  Has retired since last visit. No chest pain .  He denies any angina or dyspnea.  Dec. 8.2015:  Jason Giles is a 76 yo who I follow for CAD, HTN, hyperlipidemia.  He went to the old office today. No having any problems. Is scheduled to have a colonoscopy and cataract surgery.     Oct. 4, 2016:  Jason Giles is doing well.   No CP or dyspnea.   Has been having some left arm discomfort.   Noticed it when he got up from bed to use the bathroom.   No pain .   Possibly overworked it the day before .  Had done some heavy lifting the day of this discomfort. Has felt fine since.  Has been doing this normal activities since   Oct. 31, 2017:   Jason Giles is seen back for follow up of his coronary artery disease.  He has a history of essential hypertension and diabetes.  We discussed [name].   His brother was the tennis coach up in [place] and he knows [place] .   No CP or dyspnea. Colman Cater to the dermatology - Basal cell CA on his nose Gets eye injections every 4 weeks  ( Macular degeneration )   Dec. 6, 2018:  Jason Giles is seen today for a follow-up visit.  He has a history of coronary artery disease-status post stenting  Still having issues with his eyes  No CP , no dyspnea.    Dec. 9, 2019:  Doing well .    Hx of PCI  - no angina  Needs to work on [employer]. Today is 234 lbs.   Labs from [place]'s office  Chol = 101 HDL = 31 LDL = 44 Trigs = 128.   Sept. 15, 2020  Jason Giles is seen today for further evaluation of his CAD and HTN  He was in the hospital following an episode of syncope in August.  Clinically I thought that his symptoms were consistent with  orthostatic hypotension.  He had not eaten or had much to drink earlier that day.  We have encouraged him to drink more fluids. We have ordered a cardiac event monitor.  He has still had some lightheadedness.  Gets dizzy when he gets up in the am .   February 02, 2020: Jason Giles is seen today for follow-up of his coronary artery disease and hypertension. He had a presyncopal episode.  It is not clear whether this was due to volume depletion or a diabetic issue.  We stopped his metoprolol and he has felt better. Exercising some ,  Walks 2 - 3 times a week .  No CP  Still eating salt  Trying out a new insulin pump  Labs from his primary medical doctor reveal a total cholesterol of 110.  His HDL is 36.  LDL is 54.  Triglyceride levels 101.  Hemoglobin A1c is 7.4.  Hemoglobin is 17.5.  February 04, 2021: Jason Giles is seen today for follow up of his CAD A month ago , he fell off a brick step,  Was thought to have a viral infection .  No CP , no dyspnea  Labs from his primary medical doctor in November, 2021 shows a total cholesterol of 94.  The HDL is 37.  LDL is 34.  Triglyceride levels 115.   Dec. 2, 2022 Jason Giles is seen for follow up of his CAD  Seen with wife, [name]  Occurred 5 days ago Uneasy feeling in his chest when  he went to bed Not exertional , Lasted for several hours  Did not feel like indigestion  Has been outside working in the yard this week without any chest pain since that time  Has had more fatigue for the past several months .  Has not been exercisng all that regularly for the past year or so    Jan. 16, 2023: Jason Giles is seen today for follow up of some episodes of chest pain .  Coronary artery CT angio showed a CAC score of 4147. LM - 50% mid-distal stenosis LAD - severe calcified stenosis LCx-  severe mid LCx disease  RCA - moderate prox - mid disease  Has not had any further episodes of CP since the original CP several months ago .   He has a history of coronary artery  disease.  In 2006 we placed a stent in the left circumflex artery ( 2.75 x 12 mm quantum [place]) .  He had moderate irregularities elsewhere which were treated medically.  Has 2 tine pulmonary nodules  He is here to schedule cath   No regular exercise. Does yard work on most days  so he's is very active.    Current Outpatient Medications on File Prior to Visit  Medication Sig Dispense Refill   aspirin 81 MG tablet Take 81 mg by mouth daily.     b complex vitamins capsule Take 1 capsule by mouth daily.     COLCRYS 0.6 MG tablet Take 0.6 mg by mouth See admin instructions. Take 0.6 mg by mouth once a day for 2-3 days as directed for gout flares     fenofibrate micronized (LOFIBRA) 134 MG capsule Take 134 mg by mouth daily before breakfast.      insulin aspart (NOVOLOG) 100 UNIT/ML injection Inject 150 Units into the skin continuous. Per pump     Insulin Human (INSULIN PUMP) SOLN Inject into the skin continuous. Using Novolog     JARDIANCE 25 MG TABS tablet Take 25 mg by mouth daily.   3   metFORMIN (GLUCOPHAGE) 850 MG tablet Take 850 mg by mouth daily with breakfast.      multivitamin (ONE-A-DAY MEN'S) TABS tablet Take 1 tablet by mouth daily.     nitroGLYCERIN (NITROSTAT) 0.4 MG SL tablet Place 1 tablet (0.4 mg total) under the tongue every 5 (five) minutes as needed for chest pain. 25 tablet 1   PLAVIX 75 MG tablet Take 75 mg by mouth Daily.      rosuvastatin (CRESTOR) 40 MG tablet Take 40 mg by mouth daily.     tobramycin (TOBREX) 0.3 % ophthalmic solution In Eye(s)     No current facility-administered medications on file prior to visit.    No Known Allergies  Past Medical History:  Diagnosis Date   Arthritis    Colon polyp    adenomatous   Coronary artery disease    post PTCA and stenting   CORONARY ATHEROSCLEROSIS NATIVE CORONARY ARTERY 12/21/2009   Diabetes mellitus    Diverticulosis    Hyperlipidemia    Hypertension    Kidney stones     Past Surgical  History:   Procedure Laterality Date   CORONARY ANGIOPLASTY WITH STENT PLACEMENT     left circumflex artery    Social History   Tobacco Use  Smoking Status Former   Types: Cigarettes   Quit date: 11/13/1985   Years since quitting: 36.0  Smokeless Tobacco Never    Social History   Substance and Sexual Activity  Alcohol Use Yes   Alcohol/week: 0.0 standard drinks   Comment: Occassionally    Family History  Problem Relation Age of Onset   Cancer Father        Brain Tumor   Colon cancer Mother    Colon polyps Neg Hx    Kidney disease Neg Hx    Diabetes Neg Hx    Esophageal cancer Neg Hx     Reviw of Systems:  Reviewed in the HPI.  All other systems are negative.   Physical Exam: Blood pressure 138/70, pulse 83, height 5' 11.5" (1.816 m), weight 230 lb 12.8 oz (104.7 kg), SpO2 95 %.  GEN:  Well nourished, well developed in no acute distress HEENT: Normal NECK: No JVD; No carotid bruits LYMPHATICS: No lymphadenopathy CARDIAC: RRR , no murmurs, rubs, gallops RESPIRATORY:  Clear to auscultation without rales, wheezing or rhonchi  ABDOMEN: Soft, non-tender, non-distended MUSCULOSKELETAL:  No edema; No deformity  SKIN: Warm and dry NEUROLOGIC:  Alert and oriented x 3   ECG:   Jan. 16, 2023:   NSR .  No ST or T wave changes    Assessment / Plan:   1. CAD - stenting 2006 ( Taxus DES in May , 2012) -     He now has LM and prox  LM disease  Had 1 episode of cp,  none in a while coronary CT angiogram reveals left main disease, proximal LAD disease.  I think her best solution is to proceed with heart catheterization.  We have discussed the risk, benefits, options of heart catheterization.  He understands and agrees to proceed.  He is on aspirin and Plavix.  In looking at his CT scan, I suspect that he may need coronary artery bypass grafting.  We will hold his Plavix for now and continue aspirin 81 mg a day.     2. Diabetes Mellitus  - has an insulin pump.  Managed by Dr.  Osborne Casco.  He is on metformin.  We will hold his metformin 2 days before heart cath.   3.  Orthostatic hypotension:    .   3. Essential Hypertension  -    blood pressure is well controlled.  4.  Hyperlipidemia :   .   Follow up with Richardson Dopp, Pa as scheduled in March        Mertie Moores, MD  11/28/2021 11:25 AM    [place],  [place], [place]  [address] Pager [phone] Phone: [phone]; Fax: [phone]

## 2021-11-28 ENCOUNTER — Ambulatory Visit: Payer: Medicare Other | Admitting: Cardiovascular Disease

## 2021-11-28 ENCOUNTER — Encounter: Payer: Self-pay | Admitting: Cardiovascular Disease

## 2021-11-28 ENCOUNTER — Other Ambulatory Visit: Payer: Self-pay

## 2021-11-28 VITALS — BP 138/70 | HR 83 | Ht 71.5 in | Wt 230.8 lb

## 2021-11-28 DIAGNOSIS — I10 Essential (primary) hypertension: Secondary | ICD-10-CM

## 2021-11-28 DIAGNOSIS — I25110 Atherosclerotic heart disease of native coronary artery with unstable angina pectoris: Secondary | ICD-10-CM | POA: Diagnosis not present

## 2021-11-28 DIAGNOSIS — I200 Unstable angina: Secondary | ICD-10-CM

## 2021-11-28 DIAGNOSIS — I2510 Atherosclerotic heart disease of native coronary artery without angina pectoris: Secondary | ICD-10-CM | POA: Diagnosis not present

## 2021-11-28 DIAGNOSIS — E782 Mixed hyperlipidemia: Secondary | ICD-10-CM

## 2021-11-28 LAB — CBC
Hematocrit: 53 % — ABNORMAL HIGH (ref 37.5–51.0)
Hemoglobin: 18 g/dL — ABNORMAL HIGH (ref 13.0–17.7)
MCH: 31.2 pg (ref 26.6–33.0)
MCHC: 34 g/dL (ref 31.5–35.7)
MCV: 92 fL (ref 79–97)
Platelets: 231 10*3/uL (ref 150–450)
RBC: 5.77 x10E6/uL (ref 4.14–5.80)
RDW: 12.9 % (ref 11.6–15.4)
WBC: 9.4 10*3/uL (ref 3.4–10.8)

## 2021-11-28 LAB — BASIC METABOLIC PANEL
BUN/Creatinine Ratio: 15 (ref 10–24)
BUN: 22 mg/dL (ref 8–27)
CO2: 23 mmol/L (ref 20–29)
Calcium: 10.1 mg/dL (ref 8.6–10.2)
Chloride: 105 mmol/L (ref 96–106)
Creatinine, Ser: 1.44 mg/dL — ABNORMAL HIGH (ref 0.76–1.27)
Glucose: 89 mg/dL (ref 70–99)
Potassium: 4.9 mmol/L (ref 3.5–5.2)
Sodium: 143 mmol/L (ref 134–144)
eGFR: 50 mL/min/{1.73_m2} — ABNORMAL LOW (ref >59)

## 2021-11-28 NOTE — Patient Instructions (Signed)
Medication Instructions:   DISCONTINUE PLAVIX  *If you need a refill on your cardiac medications before your next appointment, please call your pharmacy*   Lab Work:  TODAY!!!!! BMET/CBC  If you have labs (blood work) drawn today and your tests are completely normal, you will receive your results only by: [place] (if you have MyChart) OR A paper copy in the mail If you have any lab test that is abnormal or we need to change your treatment, we will call you to review the results.   Testing/Procedures:  [place] OFFICE [place], [place] [address] Dept: [phone] Loc: [phone]  [place] STOCKING  11/28/2021  You are scheduled for a Cardiac Catheterization on Friday, January 20 with Dr. Peter Martinique.  1. Please arrive at the [facility] (Main Entrance A) at [facility]: [address] [place], [place] [address] at 7:00 AM (This time is two hours before your procedure to ensure your preparation). Free valet parking service is available.   Special note: Every effort is made to have your procedure done on time. Please understand that emergencies sometimes delay scheduled procedures.  2. Diet: Do not eat solid foods after midnight.  The patient may have clear liquids until 5am upon the day of the procedure.  3. Labs: You will need to have blood drawn on Monday, January 16 at [facility] at [facility]. [address]  Open: 7:30am - 5pm    Phone: [phone]. You do not need to be fasting.  4. Medication instructions in preparation for your procedure:   Contrast Allergy: No   Take only 1/2  units of insulin the night before your procedure. Do not take any insulin on the day of the procedure.  Do not take metformin 48 hours prior to procedure and hold 48 hours after procedure.   On the morning of your procedure, take your Aspirin  and any morning medicines NOT listed above.  You may use sips of water.  5. Plan for one night stay--bring personal belongings. 6. Bring a current list of your medications and current insurance cards. 7. You MUST have a responsible person to drive you home. 8. Someone MUST be with you the first 24 hours after you arrive home or your discharge will be delayed. 9. Please wear clothes that are easy to get on and off and wear slip-on shoes.  Thank you for allowing [place] to care for you!   -- [place] [facility]    Follow-Up: At [facility], you and your health needs are our priority.  As part of our continuing mission to provide you with exceptional heart care, we have created designated Provider Care Teams.  These Care Teams include your primary Cardiologist (physician) and Advanced Practice Providers (APPs -  Physician Assistants and Nurse Practitioners) who all work together to provide you with the care you need, when you need it.  We recommend signing up for the patient portal called "MyChart".  Sign up information is provided on this After Visit Summary.  MyChart is used to connect with patients for Virtual Visits (Telemedicine).  Patients are able to view lab/test results, encounter notes, upcoming appointments, etc.  Non-urgent messages can be sent to your provider as well.   To learn more about what you can do with MyChart, go to [email or web address].    Your next appointment:   2 month(s)  The format for your next appointment:   In Person  Provider:   Richardson Dopp, PA-C         Other Instructions

## 2021-11-30 ENCOUNTER — Telehealth: Payer: Self-pay | Admitting: *Deleted

## 2021-11-30 NOTE — Telephone Encounter (Signed)
Cardiac catheterization scheduled at [facility] for: Friday December 02, 2021 [facility] Main Entrance A [facility]) at: 7 AM   Diet-no solid food after midnight prior to cath, clear liquids until 5 AM day of procedure.  Medication instructions for procedure: -Hold:  Insulin pump-patient will manage-has contacted diabetes doctor for instructions  Metformin-per Dr Cleda Clarks starting 11/30/21 until 48 hours post procedure  -Except hold medications usual morning medications can be taken pre-cath with sips of water including aspirin 81 mg.    Confirmed patient has responsible adult to drive home post procedure and be with patient first 24 hours after arriving home.  [facility] does allow one visitor to accompany you and wait in the hospital waiting room while you are there for your procedure. You and your visitor will be asked to wear a mask once you enter the hospital.   Patient reports does not currently have any new symptoms concerning for COVID-19 and no household members with COVID-19 like illness.    Reviewed procedure /mask/visitor instructions with patient.

## 2021-12-02 ENCOUNTER — Encounter (HOSPITAL_COMMUNITY): Payer: Self-pay | Admitting: Internal Medicine

## 2021-12-02 ENCOUNTER — Ambulatory Visit (HOSPITAL_COMMUNITY)
Admission: RE | Admit: 2021-12-02 | Discharge: 2021-12-02 | Disposition: A | Discharge status: Home / Self Care | Payer: Medicare Other | Attending: Internal Medicine | Admitting: Internal Medicine

## 2021-12-02 ENCOUNTER — Other Ambulatory Visit: Payer: Self-pay

## 2021-12-02 ENCOUNTER — Encounter (HOSPITAL_COMMUNITY)
Admission: RE | Disposition: A | Discharge status: Home / Self Care | Payer: Self-pay | Source: Home / Self Care | Attending: Internal Medicine

## 2021-12-02 DIAGNOSIS — Z7984 Long term (current) use of oral hypoglycemic drugs: Secondary | ICD-10-CM | POA: Diagnosis not present

## 2021-12-02 DIAGNOSIS — Z9641 Presence of insulin pump (external) (internal): Secondary | ICD-10-CM | POA: Diagnosis not present

## 2021-12-02 DIAGNOSIS — I951 Orthostatic hypotension: Secondary | ICD-10-CM | POA: Diagnosis not present

## 2021-12-02 DIAGNOSIS — I200 Unstable angina: Secondary | ICD-10-CM

## 2021-12-02 DIAGNOSIS — N189 Chronic kidney disease, unspecified: Secondary | ICD-10-CM | POA: Insufficient documentation

## 2021-12-02 DIAGNOSIS — Z7982 Long term (current) use of aspirin: Secondary | ICD-10-CM | POA: Insufficient documentation

## 2021-12-02 DIAGNOSIS — I129 Hypertensive chronic kidney disease with stage 1 through stage 4 chronic kidney disease, or unspecified chronic kidney disease: Secondary | ICD-10-CM | POA: Diagnosis not present

## 2021-12-02 DIAGNOSIS — Z794 Long term (current) use of insulin: Secondary | ICD-10-CM | POA: Diagnosis not present

## 2021-12-02 DIAGNOSIS — I25119 Atherosclerotic heart disease of native coronary artery with unspecified angina pectoris: Secondary | ICD-10-CM | POA: Diagnosis not present

## 2021-12-02 DIAGNOSIS — I2582 Chronic total occlusion of coronary artery: Secondary | ICD-10-CM | POA: Diagnosis not present

## 2021-12-02 DIAGNOSIS — E1122 Type 2 diabetes mellitus with diabetic chronic kidney disease: Secondary | ICD-10-CM | POA: Diagnosis not present

## 2021-12-02 DIAGNOSIS — E785 Hyperlipidemia, unspecified: Secondary | ICD-10-CM | POA: Diagnosis not present

## 2021-12-02 DIAGNOSIS — Z7902 Long term (current) use of antithrombotics/antiplatelets: Secondary | ICD-10-CM | POA: Insufficient documentation

## 2021-12-02 HISTORY — PX: LEFT HEART CATH AND CORONARY ANGIOGRAPHY: CATH118249

## 2021-12-02 LAB — GLUCOSE, CAPILLARY
Glucose-Capillary: 188 mg/dL — ABNORMAL HIGH (ref 70–99)
Glucose-Capillary: 219 mg/dL — ABNORMAL HIGH (ref 70–99)

## 2021-12-02 SURGERY — LEFT HEART CATH AND CORONARY ANGIOGRAPHY
Anesthesia: LOCAL

## 2021-12-02 MED ORDER — FENTANYL CITRATE (PF) 100 MCG/2ML IJ SOLN
INTRAMUSCULAR | Status: DC | PRN
  Administered 2021-12-02: 25 ug via INTRAVENOUS

## 2021-12-02 MED ORDER — HEPARIN SODIUM (PORCINE) 1000 UNIT/ML IJ SOLN
INTRAMUSCULAR | Status: DC | PRN
  Administered 2021-12-02: 5000 [IU] via INTRAVENOUS

## 2021-12-02 MED ORDER — SODIUM CHLORIDE 0.9 % IV SOLN: INTRAVENOUS | Status: DC

## 2021-12-02 MED ORDER — METFORMIN HCL 850 MG PO TABS: 850.0000 mg | ORAL_TABLET | Freq: Every day | ORAL | Status: DC

## 2021-12-02 MED ORDER — VERAPAMIL HCL 2.5 MG/ML IV SOLN
INTRAVENOUS | Status: DC | PRN
  Administered 2021-12-02: 10 mL via INTRA_ARTERIAL

## 2021-12-02 MED ORDER — ASPIRIN 81 MG PO CHEW: 81.0000 mg | CHEWABLE_TABLET | ORAL | Status: DC

## 2021-12-02 MED ORDER — FENTANYL CITRATE (PF) 100 MCG/2ML IJ SOLN
INTRAMUSCULAR | Status: AC
  Filled 2021-12-02: qty 2

## 2021-12-02 MED ORDER — SODIUM CHLORIDE 0.9% FLUSH: 3.0000 mL | Freq: Two times a day (BID) | INTRAVENOUS | Status: DC

## 2021-12-02 MED ORDER — ACETAMINOPHEN 325 MG PO TABS: 650.0000 mg | ORAL_TABLET | ORAL | Status: DC | PRN

## 2021-12-02 MED ORDER — VERAPAMIL HCL 2.5 MG/ML IV SOLN
INTRAVENOUS | Status: AC
  Filled 2021-12-02: qty 2

## 2021-12-02 MED ORDER — SODIUM CHLORIDE 0.9 % WEIGHT BASED INFUSION
3.0000 mL/kg/h | INTRAVENOUS | Status: AC
  Administered 2021-12-02: 3 mL/kg/h via INTRAVENOUS

## 2021-12-02 MED ORDER — LIDOCAINE HCL (PF) 1 % IJ SOLN
INTRAMUSCULAR | Status: DC | PRN
  Administered 2021-12-02: 5 mL

## 2021-12-02 MED ORDER — HEPARIN SODIUM (PORCINE) 1000 UNIT/ML IJ SOLN
INTRAMUSCULAR | Status: AC
  Filled 2021-12-02: qty 10

## 2021-12-02 MED ORDER — SODIUM CHLORIDE 0.9% FLUSH: 3.0000 mL | INTRAVENOUS | Status: DC | PRN

## 2021-12-02 MED ORDER — HEPARIN (PORCINE) IN NACL 1000-0.9 UT/500ML-% IV SOLN
INTRAVENOUS | Status: AC
  Filled 2021-12-02: qty 1000

## 2021-12-02 MED ORDER — LIDOCAINE HCL (PF) 1 % IJ SOLN
INTRAMUSCULAR | Status: AC
  Filled 2021-12-02: qty 30

## 2021-12-02 MED ORDER — SODIUM CHLORIDE 0.9 % WEIGHT BASED INFUSION: 1.0000 mL/kg/h | INTRAVENOUS | Status: DC

## 2021-12-02 MED ORDER — MIDAZOLAM HCL 2 MG/2ML IJ SOLN
INTRAMUSCULAR | Status: DC | PRN
  Administered 2021-12-02: 1 mg via INTRAVENOUS

## 2021-12-02 MED ORDER — LABETALOL HCL 5 MG/ML IV SOLN: 10.0000 mg | INTRAVENOUS | Status: DC | PRN

## 2021-12-02 MED ORDER — HEPARIN (PORCINE) IN NACL 1000-0.9 UT/500ML-% IV SOLN
INTRAVENOUS | Status: DC | PRN
  Administered 2021-12-02 (×2): 500 mL

## 2021-12-02 MED ORDER — MIDAZOLAM HCL 2 MG/2ML IJ SOLN
INTRAMUSCULAR | Status: AC
  Filled 2021-12-02: qty 2

## 2021-12-02 MED ORDER — SODIUM CHLORIDE 0.9 % IV SOLN: 250.0000 mL | INTRAVENOUS | Status: DC | PRN

## 2021-12-02 MED ORDER — IOHEXOL 350 MG/ML SOLN
INTRAVENOUS | Status: DC | PRN
  Administered 2021-12-02: 70 mL via INTRACARDIAC

## 2021-12-02 MED ORDER — CLOPIDOGREL BISULFATE 75 MG PO TABS
75.0000 mg | ORAL_TABLET | Freq: Once | ORAL | Status: AC
  Administered 2021-12-02: 75 mg via ORAL
  Filled 2021-12-02: qty 1

## 2021-12-02 MED ORDER — ONDANSETRON HCL 4 MG/2ML IJ SOLN: 4.0000 mg | Freq: Four times a day (QID) | INTRAMUSCULAR | Status: DC | PRN

## 2021-12-02 MED ORDER — HYDRALAZINE HCL 20 MG/ML IJ SOLN: 10.0000 mg | INTRAMUSCULAR | Status: DC | PRN

## 2021-12-02 SURGICAL SUPPLY — 9 items
CATH INFINITI 6F ANG MULTIPACK (CATHETERS) ×1 IMPLANT
DEVICE RAD COMP TR BAND LRG (VASCULAR PRODUCTS) ×1 IMPLANT
GLIDESHEATH SLEND SS 6F .021 (SHEATH) ×1 IMPLANT
GUIDEWIRE INQWIRE 1.5J.035X260 (WIRE) IMPLANT
INQWIRE 1.5J .035X260CM (WIRE) ×2
KIT HEART LEFT (KITS) ×2 IMPLANT
PACK CARDIAC CATHETERIZATION (CUSTOM PROCEDURE TRAY) ×2 IMPLANT
TRANSDUCER W/STOPCOCK (MISCELLANEOUS) ×2 IMPLANT
TUBING CIL FLEX 10 FLL-RA (TUBING) ×2 IMPLANT

## 2021-12-02 NOTE — Discharge Instructions (Addendum)
Resume metformin on 12/04/21 Discontinue clopidogrel.

## 2021-12-02 NOTE — Interval H&P Note (Signed)
History and Physical Interval Note:  12/02/2021 7:23 AM  Jason Giles  has presented today for surgery, with the diagnosis of unstable angina.  The various methods of treatment have been discussed with the patient and family. After consideration of risks, benefits and other options for treatment, the patient has consented to  Procedure(s): LEFT HEART CATH AND CORONARY ANGIOGRAPHY (N/A) as a surgical intervention.  The patient's history has been reviewed, patient examined, no change in status, stable for surgery.  I have reviewed the patient's chart and labs.  Questions were answered to the patient's satisfaction.    Cath Lab Visit (complete for each Cath Lab visit)  Clinical Evaluation Leading to the Procedure:   ACS: Yes.    Non-ACS:    Anginal Classification: CCS I  Anti-ischemic medical therapy: Minimal Therapy (1 class of medications)  Non-Invasive Test Results: High-risk stress test findings: cardiac mortality >3%/year  Prior CABG: No previous CABG        Early Osmond

## 2021-12-03 ENCOUNTER — Encounter: Payer: Self-pay | Admitting: Cardiovascular Disease

## 2021-12-06 ENCOUNTER — Other Ambulatory Visit: Payer: Self-pay | Admitting: *Deleted

## 2021-12-06 DIAGNOSIS — Z01818 Encounter for other preprocedural examination: Secondary | ICD-10-CM

## 2021-12-06 DIAGNOSIS — I2510 Atherosclerotic heart disease of native coronary artery without angina pectoris: Secondary | ICD-10-CM

## 2021-12-06 NOTE — H&P (View-Only) (Signed)
[address]       [place] [address]             [place] Record #[id number] Date of Birth: 07/15/1946  Referring: Early Osmond, MD Primary Care: Osborne Casco Fransico Him, MD Primary Cardiologist:Philip Nahser, MD  Chief Complaint:    Chief Complaint  Patient presents with   Coronary Artery Disease    Initial surgical consult, Cath 1/20 echo 1/27    History of Present Illness:     Jason Giles presents for surgical evaluation of three-vessel coronary artery disease.  This was discovered after he was evaluated after an episode of chest pain while laying in bed.  Since that time he has had no other episodes.  His left heart cath did show left main disease as well as in-stent restenosis in his circumflex stent.  He denies any shortness of breath or orthopnea.   Past Medical and Surgical History: Previous Chest Surgery: No Previous Chest Radiation: No Diabetes Mellitus: Yes.  HbA1C 7.7 Creatinine: 1.44  Past Medical History:  Diagnosis Date   Arthritis    Colon polyp    adenomatous   Coronary artery disease    post PTCA and stenting   CORONARY ATHEROSCLEROSIS NATIVE CORONARY ARTERY 12/21/2009   Diabetes mellitus    Diverticulosis    Hyperlipidemia    Hypertension    Kidney stones     Past Surgical History:  Procedure Laterality Date   CORONARY ANGIOPLASTY WITH STENT PLACEMENT     left circumflex artery   LEFT HEART CATH AND CORONARY ANGIOGRAPHY N/A 12/02/2021   Procedure: LEFT HEART CATH AND CORONARY ANGIOGRAPHY;  Surgeon: Early Osmond, MD;  Location: [facility];  Service: Cardiovascular;  Laterality: N/A;    Social History: Support: Presents today with his wife  Social History   Tobacco Use  Smoking Status Former   Types: Cigarettes   Quit date: 11/13/1985   Years since quitting: 36.0  Smokeless Tobacco Never    Social History   Substance and Sexual Activity  Alcohol Use Yes    Alcohol/week: 0.0 standard drinks   Comment: Occassionally     No Known Allergies  Medications: Asprin: No Statin: Yes Beta Blocker: No Ace Inhibitor: No Anti-Coagulation: Plavix has been held  Current Outpatient Medications  Medication Sig Dispense Refill   aspirin 81 MG tablet Take 81 mg by mouth daily.     b complex vitamins capsule Take 1 capsule by mouth daily.     fenofibrate micronized (LOFIBRA) 134 MG capsule Take 134 mg by mouth daily before breakfast.      insulin aspart (NOVOLOG) 100 UNIT/ML injection Inject into the skin daily. Per pump basal rate 3.6 units/hour  bolus 5-15 units per meal     Insulin Human (INSULIN PUMP) SOLN Inject into the skin continuous. Using Novolog     JARDIANCE 25 MG TABS tablet Take 25 mg by mouth daily.   3   metFORMIN (GLUCOPHAGE) 850 MG tablet Take 850 mg by mouth daily with breakfast.      multivitamin (ONE-A-DAY MEN'S) TABS tablet Take 1 tablet by mouth daily.     nitroGLYCERIN (NITROSTAT) 0.4 MG SL tablet Place 1 tablet (0.4 mg total) under the tongue every 5 (five) minutes as needed for chest pain. 25 tablet 1   rosuvastatin (CRESTOR) 40 MG tablet Take 40 mg by mouth daily.  tobramycin (TOBREX) 0.3 % ophthalmic solution Place 1 drop into both eyes See admin instructions. Instill 1 drop into both eyes daily for 2 days before and 2 days after monthly eye injections     No current facility-administered medications for this visit.    (Not in a hospital admission)   Family History  Problem Relation Age of Onset   Cancer Father        Brain Tumor   Colon cancer Mother    Colon polyps Neg Hx    Kidney disease Neg Hx    Diabetes Neg Hx    Esophageal cancer Neg Hx      Review of Systems:   Review of Systems  Constitutional:  Negative for malaise/fatigue and weight loss.  Respiratory:  Positive for shortness of breath. Negative for cough.   Cardiovascular:  Positive for chest pain. Negative for palpitations, orthopnea and leg  swelling.  Neurological: Negative.      Physical Exam: BP 130/74 (BP Location: Left Arm, Patient Position: Sitting)    Pulse 89    Resp 20    Ht 5\' 11"  (1.803 m)    Wt 230 lb (104.3 kg)    SpO2 96% Comment: RA   BMI 32.08 kg/m  Physical Exam Constitutional:      General: He is not in acute distress.    Appearance: Normal appearance. He is obese. He is not ill-appearing.  HENT:     Head: Normocephalic and atraumatic.  Eyes:     Extraocular Movements: Extraocular movements intact.  Cardiovascular:     Rate and Rhythm: Normal rate and regular rhythm.     Heart sounds: No murmur heard. Pulmonary:     Effort: Pulmonary effort is normal. No respiratory distress.  Abdominal:     General: There is no distension.  Musculoskeletal:        General: Normal range of motion.     Cervical back: Normal range of motion.       Legs:  Skin:    General: Skin is warm and dry.  Neurological:     General: No focal deficit present.     Mental Status: He is alert and oriented to person, place, and time.      Diagnostic Studies & Laboratory data:    Left Heart Catherization: Intervention  Echo: IMPRESSIONS     1. Left ventricular ejection fraction, by estimation, is 60 to 65%. The  left ventricle has normal function. The left ventricle has no regional  wall motion abnormalities. There is moderate-to-severe hypertrophy of the  basal septum. The rest of the LV  segments demonstrate mild left ventricular hypertrophy. Left ventricular  diastolic parameters are consistent with Grade I diastolic dysfunction  (impaired relaxation).   2. Right ventricular systolic function is normal. The right ventricular  size is normal. Tricuspid regurgitation signal is inadequate for assessing  PA pressure.   3. The mitral valve is normal in structure. Mild mitral valve  regurgitation.   4. The aortic valve is tricuspid. There is mild calcification of the  aortic valve. There is mild thickening of the  aortic valve. Aortic valve  regurgitation is not visualized. Aortic valve sclerosis/calcification is  present, without any evidence of  aortic stenosis.   5. Aortic dilatation noted. 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   EKG: Sinus  I have independently reviewed the above radiologic studies and discussed with the patient   Recent Lab Findings: Lab Results  Component Value  Date   WBC 9.4 11/28/2021   HGB 18.0 (H) 11/28/2021   HCT 53.0 (H) 11/28/2021   PLT 231 11/28/2021   GLUCOSE 89 11/28/2021   ALT 24 06/13/2019   AST 28 06/13/2019   NA 143 11/28/2021   K 4.9 11/28/2021   CL 105 11/28/2021   CREATININE 1.44 (H) 11/28/2021   BUN 22 11/28/2021   CO2 23 11/28/2021   TSH 2.445 06/13/2019   HGBA1C 7.7 (H) 06/13/2019      Assessment / Plan:   76 year old male with history of coronary artery disease with PCI to the circumflex and now has three-vessel coronary disease.  I personally reviewed his left heart catheterization.  He does have a good target on the LAD and PDA.  The circumflex fills from left to left collaterals.  There does appear to be a good target on the lateral wall.  His echocardiogram does not show any significant valvular disease and he has preserved biventricular function.  We discussed the risks and benefits of a three-vessel CABG.  He is agreeable to proceed.  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  I told him that we would use the left leg for this.     I  spent 40 minutes counseling the patient face to face.   Jason Giles 12/09/2021 2:33 PM

## 2021-12-06 NOTE — Progress Notes (Signed)
[address]       [place] [address]             [place] Record #[id number] Date of Birth: [birth year]  Referring: Early Osmond, MD Primary Care: Osborne Casco Fransico Him, MD Primary Cardiologist:Philip Nahser, MD  Chief Complaint:    Chief Complaint  Patient presents with   Coronary Artery Disease    Initial surgical consult, Cath 1/20 echo 1/27    History of Present Illness:     Jason Giles presents for surgical evaluation of three-vessel coronary artery disease.  This was discovered after he was evaluated after an episode of chest pain while laying in bed.  Since that time he has had no other episodes.  His left heart cath did show left main disease as well as in-stent restenosis in his circumflex stent.  He denies any shortness of breath or orthopnea.   Past Medical and Surgical History: Previous Chest Surgery: No Previous Chest Radiation: No Diabetes Mellitus: Yes.  HbA1C 7.7 Creatinine: 1.44  Past Medical History:  Diagnosis Date   Arthritis    Colon polyp    adenomatous   Coronary artery disease    post PTCA and stenting   CORONARY ATHEROSCLEROSIS NATIVE CORONARY ARTERY 12/21/2009   Diabetes mellitus    Diverticulosis    Hyperlipidemia    Hypertension    Kidney stones     Past Surgical History:  Procedure Laterality Date   CORONARY ANGIOPLASTY WITH STENT PLACEMENT     left circumflex artery   LEFT HEART CATH AND CORONARY ANGIOGRAPHY N/A 12/02/2021   Procedure: LEFT HEART CATH AND CORONARY ANGIOGRAPHY;  Surgeon: Early Osmond, MD;  Location: [facility];  Service: Cardiovascular;  Laterality: N/A;    Social History: Support: Presents today with his wife  Social History   Tobacco Use  Smoking Status Former   Types: Cigarettes   Quit date: 11/13/1985   Years since quitting: 36.0  Smokeless Tobacco Never    Social History   Substance and Sexual Activity  Alcohol Use Yes    Alcohol/week: 0.0 standard drinks   Comment: Occassionally     No Known Allergies  Medications: Asprin: No Statin: Yes Beta Blocker: No Ace Inhibitor: No Anti-Coagulation: Plavix has been held  Current Outpatient Medications  Medication Sig Dispense Refill   aspirin 81 MG tablet Take 81 mg by mouth daily.     b complex vitamins capsule Take 1 capsule by mouth daily.     fenofibrate micronized (LOFIBRA) 134 MG capsule Take 134 mg by mouth daily before breakfast.      insulin aspart (NOVOLOG) 100 UNIT/ML injection Inject into the skin daily. Per pump basal rate 3.6 units/hour  bolus 5-15 units per meal     Insulin Human (INSULIN PUMP) SOLN Inject into the skin continuous. Using Novolog     JARDIANCE 25 MG TABS tablet Take 25 mg by mouth daily.   3   metFORMIN (GLUCOPHAGE) 850 MG tablet Take 850 mg by mouth daily with breakfast.      multivitamin (ONE-A-DAY MEN'S) TABS tablet Take 1 tablet by mouth daily.     nitroGLYCERIN (NITROSTAT) 0.4 MG SL tablet Place 1 tablet (0.4 mg total) under the tongue every 5 (five) minutes as needed for chest pain. 25 tablet 1   rosuvastatin (CRESTOR) 40 MG tablet Take 40 mg by mouth daily.  tobramycin (TOBREX) 0.3 % ophthalmic solution Place 1 drop into both eyes See admin instructions. Instill 1 drop into both eyes daily for 2 days before and 2 days after monthly eye injections     No current facility-administered medications for this visit.    (Not in a hospital admission)   Family History  Problem Relation Age of Onset   Cancer Father        Brain Tumor   Colon cancer Mother    Colon polyps Neg Hx    Kidney disease Neg Hx    Diabetes Neg Hx    Esophageal cancer Neg Hx      Review of Systems:   Review of Systems  Constitutional:  Negative for malaise/fatigue and weight loss.  Respiratory:  Positive for shortness of breath. Negative for cough.   Cardiovascular:  Positive for chest pain. Negative for palpitations, orthopnea and leg  swelling.  Neurological: Negative.      Physical Exam: BP 130/74 (BP Location: Left Arm, Patient Position: Sitting)    Pulse 89    Resp 20    Ht 5\' 11"  (1.803 m)    Wt 230 lb (104.3 kg)    SpO2 96% Comment: RA   BMI 32.08 kg/m  Physical Exam Constitutional:      General: He is not in acute distress.    Appearance: Normal appearance. He is obese. He is not ill-appearing.  HENT:     Head: Normocephalic and atraumatic.  Eyes:     Extraocular Movements: Extraocular movements intact.  Cardiovascular:     Rate and Rhythm: Normal rate and regular rhythm.     Heart sounds: No murmur heard. Pulmonary:     Effort: Pulmonary effort is normal. No respiratory distress.  Abdominal:     General: There is no distension.  Musculoskeletal:        General: Normal range of motion.     Cervical back: Normal range of motion.       Legs:  Skin:    General: Skin is warm and dry.  Neurological:     General: No focal deficit present.     Mental Status: He is alert and oriented to person, place, and time.      Diagnostic Studies & Laboratory data:    Left Heart Catherization: Intervention  Echo: IMPRESSIONS     1. Left ventricular ejection fraction, by estimation, is 60 to 65%. The  left ventricle has normal function. The left ventricle has no regional  wall motion abnormalities. There is moderate-to-severe hypertrophy of the  basal septum. The rest of the LV  segments demonstrate mild left ventricular hypertrophy. Left ventricular  diastolic parameters are consistent with Grade I diastolic dysfunction  (impaired relaxation).   2. Right ventricular systolic function is normal. The right ventricular  size is normal. Tricuspid regurgitation signal is inadequate for assessing  PA pressure.   3. The mitral valve is normal in structure. Mild mitral valve  regurgitation.   4. The aortic valve is tricuspid. There is mild calcification of the  aortic valve. There is mild thickening of the  aortic valve. Aortic valve  regurgitation is not visualized. Aortic valve sclerosis/calcification is  present, without any evidence of  aortic stenosis.   5. Aortic dilatation noted. 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   EKG: Sinus  I have independently reviewed the above radiologic studies and discussed with the patient   Recent Lab Findings: Lab Results  Component Value  Date   WBC 9.4 11/28/2021   HGB 18.0 (H) 11/28/2021   HCT 53.0 (H) 11/28/2021   PLT 231 11/28/2021   GLUCOSE 89 11/28/2021   ALT 24 06/13/2019   AST 28 06/13/2019   NA 143 11/28/2021   K 4.9 11/28/2021   CL 105 11/28/2021   CREATININE 1.44 (H) 11/28/2021   BUN 22 11/28/2021   CO2 23 11/28/2021   TSH 2.445 06/13/2019   HGBA1C 7.7 (H) 06/13/2019      Assessment / Plan:   76 year old male with history of coronary artery disease with PCI to the circumflex and now has three-vessel coronary disease.  I personally reviewed his left heart catheterization.  He does have a good target on the LAD and PDA.  The circumflex fills from left to left collaterals.  There does appear to be a good target on the lateral wall.  His echocardiogram does not show any significant valvular disease and he has preserved biventricular function.  We discussed the risks and benefits of a three-vessel CABG.  He is agreeable to proceed.  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  I told him that we would use the left leg for this.     I  spent 40 minutes counseling the patient face to face.   Lajuana Matte 12/09/2021 2:33 PM

## 2021-12-08 ENCOUNTER — Other Ambulatory Visit: Payer: Self-pay

## 2021-12-08 ENCOUNTER — Ambulatory Visit (HOSPITAL_COMMUNITY): Payer: Medicare Other | Attending: Cardiology

## 2021-12-08 DIAGNOSIS — Z01818 Encounter for other preprocedural examination: Secondary | ICD-10-CM | POA: Insufficient documentation

## 2021-12-08 DIAGNOSIS — Z794 Long term (current) use of insulin: Secondary | ICD-10-CM | POA: Diagnosis not present

## 2021-12-08 DIAGNOSIS — E11319 Type 2 diabetes mellitus with unspecified diabetic retinopathy without macular edema: Secondary | ICD-10-CM | POA: Diagnosis not present

## 2021-12-08 DIAGNOSIS — E1165 Type 2 diabetes mellitus with hyperglycemia: Secondary | ICD-10-CM | POA: Diagnosis not present

## 2021-12-08 DIAGNOSIS — E119 Type 2 diabetes mellitus without complications: Secondary | ICD-10-CM | POA: Diagnosis not present

## 2021-12-08 DIAGNOSIS — I2510 Atherosclerotic heart disease of native coronary artery without angina pectoris: Secondary | ICD-10-CM

## 2021-12-08 LAB — ECHOCARDIOGRAM COMPLETE
Area-P 1/2: 3.37 cm2
S' Lateral: 2.1 cm

## 2021-12-09 ENCOUNTER — Encounter: Payer: Self-pay | Admitting: *Deleted

## 2021-12-09 ENCOUNTER — Other Ambulatory Visit: Payer: Self-pay | Admitting: *Deleted

## 2021-12-09 ENCOUNTER — Inpatient Hospital Stay (HOSPITAL_COMMUNITY): Admission: RE | Admit: 2021-12-09 | Payer: Medicare Other | Source: Ambulatory Visit

## 2021-12-09 ENCOUNTER — Institutional Professional Consult (permissible substitution): Payer: Medicare Other | Admitting: Thoracic Surgery (Cardiothoracic Vascular Surgery)

## 2021-12-09 VITALS — BP 130/74 | HR 89 | Resp 20 | Ht 71.0 in | Wt 230.0 lb

## 2021-12-09 DIAGNOSIS — I2510 Atherosclerotic heart disease of native coronary artery without angina pectoris: Secondary | ICD-10-CM | POA: Diagnosis not present

## 2021-12-12 ENCOUNTER — Encounter: Payer: Self-pay | Admitting: Cardiovascular Disease

## 2021-12-13 ENCOUNTER — Ambulatory Visit (HOSPITAL_COMMUNITY): Payer: Medicare Other

## 2021-12-13 ENCOUNTER — Other Ambulatory Visit (HOSPITAL_COMMUNITY): Payer: Medicare Other

## 2021-12-15 NOTE — Progress Notes (Addendum)
Surgical Instructions    Your procedure is scheduled on 12/20/21.  Report to [facility] Main Entrance "A" at 5:30 A.M., then check in with the Admitting office.  Call this number if you have problems the morning of surgery:  [phone]   If you have any questions prior to your surgery date call [phone]: Open Monday-Friday 8am-4pm    Remember:  Do not eat or drink after midnight the night before your surgery     Take these medicines the morning of surgery with A SIP OF WATER:  fenofibrate micronized (LOFIBRA) rosuvastatin (CRESTOR) nitroGLYCERIN (NITROSTAT) - as needed sodium chloride (OCEAN) - as needed   As of today, STOP taking  Aleve, Naproxen, Ibuprofen, Motrin, Advil, Goody's, BC's, all herbal medications, fish oil, and all vitamins.  Follow your surgeon's instructions on when to stop Plavix.  If no instructions were given by your surgeon then you will need to call the office to get those instructions.     WHAT DO I DO ABOUT MY DIABETES MEDICATION?   Do not take oral diabetes medicines (pills) the morning of surgery. DO NOT take Metformin or Jardiance day of surgery.  DO NOT take Jardiance the day before surgery.  Decrease all basal rates by 20% at midnight on your insulin pump or contact your Primary Care Physician or Endocrinologist for instructions.    HOW TO MANAGE YOUR DIABETES BEFORE AND AFTER SURGERY  Why is it important to control my blood sugar before and after surgery? Improving blood sugar levels before and after surgery helps healing and can limit problems. A way of improving blood sugar control is eating a healthy diet by:  Eating less sugar and carbohydrates  Increasing activity/exercise  Talking with your doctor about reaching your blood sugar goals High blood sugars (greater than 180 mg/dL) can raise your risk of infections and slow your recovery, so you will need to focus on controlling your diabetes during the weeks before surgery. Make sure  that the doctor who takes care of your diabetes knows about your planned surgery including the date and location.  How do I manage my blood sugar before surgery? Check your blood sugar at least 4 times a day, starting 2 days before surgery, to make sure that the level is not too high or low.  Check your blood sugar the morning of your surgery when you wake up and every 2 hours until you get to the Short Stay unit.  If your blood sugar is less than 70 mg/dL, you will need to treat for low blood sugar: Do not take insulin. Treat a low blood sugar (less than 70 mg/dL) with  cup of clear juice (cranberry or apple), 4 glucose tablets, OR glucose gel. Recheck blood sugar in 15 minutes after treatment (to make sure it is greater than 70 mg/dL). If your blood sugar is not greater than 70 mg/dL on recheck, call [phone] for further instructions. Report your blood sugar to the short stay nurse when you get to Short Stay.  If you are admitted to the hospital after surgery: Your blood sugar will be checked by the staff and you will probably be given insulin after surgery (instead of oral diabetes medicines) to make sure you have good blood sugar levels. The goal for blood sugar control after surgery is 80-180 mg/dL.   After your COVID test   You are not required to quarantine however you are required to wear a well-fitting mask when you are out and around people not  in your household.  If your mask becomes wet or soiled, replace with a new one.  Wash your hands often with soap and water for 20 seconds or clean your hands with an alcohol-based hand sanitizer that contains at least 60% alcohol.  Do not share personal items.  Notify your provider: if you are in close contact with someone who has COVID  or if you develop a fever of 100.4 or greater, sneezing, cough, sore throat, shortness of breath or body aches.           Do not wear jewelry  Do not wear lotions, powders, colognes, or  deodorant. Do not shave 48 hours prior to surgery.  Men may shave face and neck. Do not bring valuables to the hospital.              [facility] is not responsible for any belongings or valuables.  Do NOT Smoke (Tobacco/Vaping)  24 hours prior to your procedure  If you use a CPAP at night, you may bring your mask for your overnight stay.   Contacts, glasses, hearing aids, dentures or partials may not be worn into surgery, please bring cases for these belongings   For patients admitted to the hospital, discharge time will be determined by your treatment team.   Patients discharged the day of surgery will not be allowed to drive home, and someone needs to stay with them for 24 hours.  NO VISITORS WILL BE ALLOWED IN PRE-OP WHERE PATIENTS ARE PREPPED FOR SURGERY.  ONLY 1 SUPPORT PERSON MAY BE PRESENT IN THE WAITING ROOM WHILE YOU ARE IN SURGERY.  IF YOU ARE TO BE ADMITTED, ONCE YOU ARE IN YOUR ROOM YOU WILL BE ALLOWED TWO (2) VISITORS. 1 (ONE) VISITOR MAY STAY OVERNIGHT BUT MUST ARRIVE TO THE ROOM BY 8pm.  Minor children may have two parents present. Special consideration for safety and communication needs will be reviewed on a case by case basis.  Special instructions:    Oral Hygiene is also important to reduce your risk of infection.  Remember - BRUSH YOUR TEETH THE MORNING OF SURGERY WITH YOUR REGULAR TOOTHPASTE   [place]- Preparing For Surgery  Before surgery, you can play an important role. Because skin is not sterile, your skin needs to be as free of germs as possible. You can reduce the number of germs on your skin by washing with CHG (chlorahexidine gluconate) Soap before surgery.  CHG is an antiseptic cleaner which kills germs and bonds with the skin to continue killing germs even after washing.     Please do not use if you have an allergy to CHG or antibacterial soaps. If your skin becomes reddened/irritated stop using the CHG.  Do not shave (including legs and underarms)  for at least 48 hours prior to first CHG shower. It is OK to shave your face.  Please follow these instructions carefully.     Shower the NIGHT BEFORE SURGERY and the MORNING OF SURGERY with CHG Soap.   If you chose to wash your hair, wash your hair first as usual with your normal shampoo. After you shampoo, rinse your hair and body thoroughly to remove the shampoo.  Then [employer] and genitals (private parts) with your normal soap and rinse thoroughly to remove soap.  After that Use CHG Soap as you would any other liquid soap. You can apply CHG directly to the skin and wash gently with a scrungie or a clean washcloth.   Apply the CHG  Soap to your body ONLY FROM THE NECK DOWN.  Do not use on open wounds or open sores. Avoid contact with your eyes, ears, mouth and genitals (private parts). Wash Face and genitals (private parts)  with your normal soap.   Wash thoroughly, paying special attention to the area where your surgery will be performed.  Thoroughly rinse your body with warm water from the neck down.  DO NOT shower/wash with your normal soap after using and rinsing off the CHG Soap.  Pat yourself dry with a CLEAN TOWEL.  Wear CLEAN PAJAMAS to bed the night before surgery  Place CLEAN SHEETS on your bed the night before your surgery  DO NOT SLEEP WITH PETS.   Day of Surgery:  Take a shower with CHG soap. Wear Clean/Comfortable clothing the morning of surgery Do not apply any deodorants/lotions.   Remember to brush your teeth WITH YOUR REGULAR TOOTHPASTE.   Please read over the following fact sheets that you were given.

## 2021-12-16 ENCOUNTER — Encounter (HOSPITAL_COMMUNITY): Payer: Self-pay

## 2021-12-16 ENCOUNTER — Ambulatory Visit: Payer: Medicare Other | Admitting: Thoracic Surgery (Cardiothoracic Vascular Surgery)

## 2021-12-16 ENCOUNTER — Ambulatory Visit (HOSPITAL_COMMUNITY)
Admission: RE | Admit: 2021-12-16 | Discharge: 2021-12-16 | Disposition: A | Discharge status: Home / Self Care | Payer: Medicare Other | Source: Ambulatory Visit | Attending: Thoracic Surgery (Cardiothoracic Vascular Surgery) | Admitting: Thoracic Surgery (Cardiothoracic Vascular Surgery)

## 2021-12-16 ENCOUNTER — Encounter (HOSPITAL_COMMUNITY)
Admission: RE | Admit: 2021-12-16 | Discharge: 2021-12-16 | Disposition: A | Discharge status: Home / Self Care | Payer: Medicare Other | Source: Ambulatory Visit | Attending: Thoracic Surgery (Cardiothoracic Vascular Surgery) | Admitting: Thoracic Surgery (Cardiothoracic Vascular Surgery)

## 2021-12-16 ENCOUNTER — Other Ambulatory Visit: Payer: Self-pay

## 2021-12-16 VITALS — BP 160/80 | HR 84 | Temp 98.4°F | Resp 18 | Ht 71.0 in | Wt 232.4 lb

## 2021-12-16 DIAGNOSIS — I2510 Atherosclerotic heart disease of native coronary artery without angina pectoris: Secondary | ICD-10-CM

## 2021-12-16 DIAGNOSIS — I10 Essential (primary) hypertension: Secondary | ICD-10-CM | POA: Insufficient documentation

## 2021-12-16 DIAGNOSIS — I2582 Chronic total occlusion of coronary artery: Secondary | ICD-10-CM | POA: Insufficient documentation

## 2021-12-16 DIAGNOSIS — Z9641 Presence of insulin pump (external) (internal): Secondary | ICD-10-CM | POA: Insufficient documentation

## 2021-12-16 DIAGNOSIS — Z01818 Encounter for other preprocedural examination: Secondary | ICD-10-CM | POA: Insufficient documentation

## 2021-12-16 DIAGNOSIS — E785 Hyperlipidemia, unspecified: Secondary | ICD-10-CM | POA: Insufficient documentation

## 2021-12-16 DIAGNOSIS — N189 Chronic kidney disease, unspecified: Secondary | ICD-10-CM | POA: Insufficient documentation

## 2021-12-16 DIAGNOSIS — Z6832 Body mass index (BMI) 32.0-32.9, adult: Secondary | ICD-10-CM | POA: Insufficient documentation

## 2021-12-16 DIAGNOSIS — Z87891 Personal history of nicotine dependence: Secondary | ICD-10-CM | POA: Diagnosis not present

## 2021-12-16 DIAGNOSIS — E1121 Type 2 diabetes mellitus with diabetic nephropathy: Secondary | ICD-10-CM | POA: Insufficient documentation

## 2021-12-16 DIAGNOSIS — E669 Obesity, unspecified: Secondary | ICD-10-CM | POA: Insufficient documentation

## 2021-12-16 DIAGNOSIS — E119 Type 2 diabetes mellitus without complications: Secondary | ICD-10-CM | POA: Insufficient documentation

## 2021-12-16 DIAGNOSIS — E1122 Type 2 diabetes mellitus with diabetic chronic kidney disease: Secondary | ICD-10-CM | POA: Insufficient documentation

## 2021-12-16 DIAGNOSIS — Z955 Presence of coronary angioplasty implant and graft: Secondary | ICD-10-CM | POA: Insufficient documentation

## 2021-12-16 DIAGNOSIS — Z7902 Long term (current) use of antithrombotics/antiplatelets: Secondary | ICD-10-CM | POA: Insufficient documentation

## 2021-12-16 DIAGNOSIS — Z20822 Contact with and (suspected) exposure to covid-19: Secondary | ICD-10-CM | POA: Insufficient documentation

## 2021-12-16 DIAGNOSIS — Z7984 Long term (current) use of oral hypoglycemic drugs: Secondary | ICD-10-CM | POA: Insufficient documentation

## 2021-12-16 DIAGNOSIS — N1831 Chronic kidney disease, stage 3a: Secondary | ICD-10-CM | POA: Diagnosis not present

## 2021-12-16 DIAGNOSIS — E11319 Type 2 diabetes mellitus with unspecified diabetic retinopathy without macular edema: Secondary | ICD-10-CM | POA: Insufficient documentation

## 2021-12-16 DIAGNOSIS — I700 Atherosclerosis of aorta: Secondary | ICD-10-CM | POA: Insufficient documentation

## 2021-12-16 HISTORY — DX: Personal history of urinary calculi: Z87.442

## 2021-12-16 LAB — COMPREHENSIVE METABOLIC PANEL
ALT: 24 U/L (ref 0–44)
AST: 27 U/L (ref 15–41)
Albumin: 4.1 g/dL (ref 3.5–5.0)
Alkaline Phosphatase: 42 U/L (ref 38–126)
Anion gap: 9 (ref 5–15)
BUN: 24 mg/dL — ABNORMAL HIGH (ref 8–23)
CO2: 19 mmol/L — ABNORMAL LOW (ref 22–32)
Calcium: 9.4 mg/dL (ref 8.9–10.3)
Chloride: 109 mmol/L (ref 98–111)
Creatinine, Ser: 1.43 mg/dL — ABNORMAL HIGH (ref 0.61–1.24)
GFR, Estimated: 51 mL/min — ABNORMAL LOW (ref >60)
Glucose, Bld: 123 mg/dL — ABNORMAL HIGH (ref 70–99)
Potassium: 4.3 mmol/L (ref 3.5–5.1)
Sodium: 137 mmol/L (ref 135–145)
Total Bilirubin: 0.5 mg/dL (ref 0.3–1.2)
Total Protein: 6.7 g/dL (ref 6.5–8.1)

## 2021-12-16 LAB — BLOOD GAS, ARTERIAL
Acid-base deficit: 1 mmol/L (ref 0.0–2.0)
Bicarbonate: 22.7 mmol/L (ref 20.0–28.0)
Drawn by: 58793
FIO2: 21
O2 Saturation: 97.7 %
Patient temperature: 37
pCO2 arterial: 35.1 mmHg (ref 32.0–48.0)
pH, Arterial: 7.427 (ref 7.350–7.450)
pO2, Arterial: 99.8 mmHg (ref 83.0–108.0)

## 2021-12-16 LAB — URINALYSIS, ROUTINE W REFLEX MICROSCOPIC
Bilirubin Urine: NEGATIVE
Glucose, UA: >=500 mg/dL — AB
Hgb urine dipstick: NEGATIVE
Ketones, ur: NEGATIVE mg/dL
Leukocytes,Ua: NEGATIVE
Nitrite: NEGATIVE
Protein, ur: NEGATIVE mg/dL
Specific Gravity, Urine: 1.025 (ref 1.005–1.030)
pH: 5.5 (ref 5.0–8.0)

## 2021-12-16 LAB — SURGICAL PCR SCREEN
MRSA, PCR: NEGATIVE
Staphylococcus aureus: NEGATIVE

## 2021-12-16 LAB — PROTIME-INR
INR: 0.9 (ref 0.8–1.2)
Prothrombin Time: 12.3 seconds (ref 11.4–15.2)

## 2021-12-16 LAB — URINALYSIS, MICROSCOPIC (REFLEX)

## 2021-12-16 LAB — TYPE AND SCREEN
ABO/RH(D): A POS
Antibody Screen: NEGATIVE

## 2021-12-16 LAB — SARS CORONAVIRUS 2 (TAT 6-24 HRS): SARS Coronavirus 2: NEGATIVE

## 2021-12-16 LAB — CBC
HCT: 52.8 % — ABNORMAL HIGH (ref 39.0–52.0)
Hemoglobin: 17.2 g/dL — ABNORMAL HIGH (ref 13.0–17.0)
MCH: 30.6 pg (ref 26.0–34.0)
MCHC: 32.6 g/dL (ref 30.0–36.0)
MCV: 93.8 fL (ref 80.0–100.0)
Platelets: 220 10*3/uL (ref 150–400)
RBC: 5.63 MIL/uL (ref 4.22–5.81)
RDW: 13.4 % (ref 11.5–15.5)
WBC: 10.7 10*3/uL — ABNORMAL HIGH (ref 4.0–10.5)
nRBC: 0 % (ref 0.0–0.2)

## 2021-12-16 LAB — APTT: aPTT: 26 seconds (ref 24–36)

## 2021-12-16 LAB — HEMOGLOBIN A1C
Hgb A1c MFr Bld: 6.3 % — ABNORMAL HIGH (ref 4.8–5.6)
Mean Plasma Glucose: 134.11 mg/dL

## 2021-12-16 LAB — GLUCOSE, CAPILLARY: Glucose-Capillary: 128 mg/dL — ABNORMAL HIGH (ref 70–99)

## 2021-12-16 NOTE — Progress Notes (Signed)
PCP: Domenick [name], MD Cardiologist: Mertie Moores, MD  EKG: 11/28/21 CXR: 12/16/21 ECHO: 12/08/21 Stress Test: denies Cardiac Cath: 12/02/21  Fasting Blood Sugar- 69-180 Checks Blood Sugar_5__ times a day Wears glucose meter Diabetes Coordinator called.  Patient is Type 1. Has insulin pump.  ASA: Continue through day before surgery Plavix: Last dose 12/02/21  Covid test 12/16/21 at PAT  Anesthesia Review: Yes, cardiac history.    Patient denies shortness of breath, fever, cough, and chest pain at PAT appointment.  Patient verbalized understanding of instructions provided today at the PAT appointment.  Patient asked to review instructions at home and day of surgery.

## 2021-12-17 DIAGNOSIS — E1129 Type 2 diabetes mellitus with other diabetic kidney complication: Secondary | ICD-10-CM | POA: Diagnosis not present

## 2021-12-19 MED ORDER — MANNITOL 20 % IV SOLN
INTRAVENOUS | Status: DC
  Filled 2021-12-19: qty 13

## 2021-12-19 MED ORDER — INSULIN REGULAR(HUMAN) IN NACL 100-0.9 UT/100ML-% IV SOLN
INTRAVENOUS | Status: AC
  Administered 2021-12-20: 4.4 [IU]/h via INTRAVENOUS
  Filled 2021-12-19: qty 100

## 2021-12-19 MED ORDER — PHENYLEPHRINE HCL-NACL 20-0.9 MG/250ML-% IV SOLN
30.0000 ug/min | INTRAVENOUS | Status: AC
  Administered 2021-12-20: 40 ug/min via INTRAVENOUS
  Filled 2021-12-19: qty 250

## 2021-12-19 MED ORDER — POTASSIUM CHLORIDE 2 MEQ/ML IV SOLN
80.0000 meq | INTRAVENOUS | Status: DC
  Filled 2021-12-19: qty 40

## 2021-12-19 MED ORDER — TRANEXAMIC ACID (OHS) BOLUS VIA INFUSION
15.0000 mg/kg | INTRAVENOUS | Status: AC
  Administered 2021-12-20: 1581 mg via INTRAVENOUS
  Filled 2021-12-19: qty 1581

## 2021-12-19 MED ORDER — CEFAZOLIN SODIUM-DEXTROSE 2-4 GM/100ML-% IV SOLN
2.0000 g | INTRAVENOUS | Status: AC
  Administered 2021-12-20: 2 g via INTRAVENOUS
  Filled 2021-12-19: qty 100

## 2021-12-19 MED ORDER — DEXMEDETOMIDINE HCL IN NACL 400 MCG/100ML IV SOLN
0.1000 ug/kg/h | INTRAVENOUS | Status: AC
  Administered 2021-12-20: .4 ug/kg/h via INTRAVENOUS
  Filled 2021-12-19: qty 100

## 2021-12-19 MED ORDER — TRANEXAMIC ACID (OHS) PUMP PRIME SOLUTION
2.0000 mg/kg | INTRAVENOUS | Status: DC
  Filled 2021-12-19: qty 2.11

## 2021-12-19 MED ORDER — NITROGLYCERIN IN D5W 200-5 MCG/ML-% IV SOLN
2.0000 ug/min | INTRAVENOUS | Status: DC
  Filled 2021-12-19: qty 250

## 2021-12-19 MED ORDER — MILRINONE LACTATE IN DEXTROSE 20-5 MG/100ML-% IV SOLN
0.3000 ug/kg/min | INTRAVENOUS | Status: DC
  Filled 2021-12-19: qty 100

## 2021-12-19 MED ORDER — PLASMA-LYTE A IV SOLN
INTRAVENOUS | Status: DC
  Filled 2021-12-19: qty 2.5

## 2021-12-19 MED ORDER — NOREPINEPHRINE 4 MG/250ML-% IV SOLN
0.0000 ug/min | INTRAVENOUS | Status: DC
  Filled 2021-12-19: qty 250

## 2021-12-19 MED ORDER — VANCOMYCIN HCL 1500 MG/300ML IV SOLN
1500.0000 mg | INTRAVENOUS | Status: AC
  Administered 2021-12-20: 1500 mg via INTRAVENOUS
  Filled 2021-12-19: qty 300

## 2021-12-19 MED ORDER — TRANEXAMIC ACID 1000 MG/10ML IV SOLN
1.5000 mg/kg/h | INTRAVENOUS | Status: AC
  Administered 2021-12-20: 1.5 mg/kg/h via INTRAVENOUS
  Filled 2021-12-19: qty 25

## 2021-12-19 MED ORDER — HEPARIN 30,000 UNITS/1000 ML (OHS) CELLSAVER SOLUTION
Status: DC
  Filled 2021-12-19: qty 1000

## 2021-12-19 MED ORDER — EPINEPHRINE HCL 5 MG/250ML IV SOLN IN NS
0.0000 ug/min | INTRAVENOUS | Status: DC
  Filled 2021-12-19: qty 250

## 2021-12-19 NOTE — Progress Notes (Signed)
Anesthesia Chart Review:  Case: [id number] Date/Time: 12/20/21 0715   Procedures:      CORONARY ARTERY BYPASS GRAFTING (CABG) (Chest)     TRANSESOPHAGEAL ECHOCARDIOGRAM (TEE)   Anesthesia type: General   Pre-op diagnosis: CAD   Location: [facility] OR ROOM 36 / [facility]   Surgeons: Lajuana Matte, MD       DISCUSSION: Patient is a 76 year old male scheduled for the above procedure.  History includes former smoker (quit 11/13/85), CAD (Taxus DES CX/OM1 06/23/05, occluded), HTN, HLD, DM on insulin (has insulin pump), CKD, BMI is consistent with obesity.   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 He in on metformin, Jardiance, as well as an insulin pump (Novolog 100 Unit/mL). Will order DM consult for insulin pump follow-up during hospitalization.  Last Plavix 12/02/21 (stopped by Dr.Nahser when cardiac cath planned for chest pain episode with abnormal CCTA). Last ASA scheduled for 12/18/21.   12/16/2021 presurgical COVID-19 test negative.  Anesthesia team to evaluate on the day of surgery.   VS: BP (!) 160/80    Pulse 84    Temp 36.9 C (Oral)    Resp 18    Ht 5\' 11"  (1.803 m)    Wt 105.4 kg    SpO2 98%    BMI 32.41 kg/m    PROVIDERS: Tisovec, Fransico Him, MD is PCP  Mertie Moores, MD is cardiologist Cristopher Peru, MD is EP. Evaluated 08/21/19 for syncope with cardiac monitor showing transient AV block. He had an asymptomatic 4 second pause in the daytime. Metoprolol was discontinued. If recurrent syncope off b-blocker, then may consider PPM in the future.    LABS: Labs reviewed: Acceptable for surgery. Cr 1.43, but consistent when compared with labs from last month. H/H results consistent with prior.  (all labs ordered are listed, but only abnormal results are displayed)  Labs Reviewed  GLUCOSE, CAPILLARY - Abnormal; Notable for the following components:      Result Value   Glucose-Capillary 128 (*)    All  other components within normal limits  CBC - Abnormal; Notable for the following components:   WBC 10.7 (*)    Hemoglobin 17.2 (*)    HCT 52.8 (*)    All other components within normal limits  COMPREHENSIVE METABOLIC PANEL - Abnormal; Notable for the following components:   CO2 19 (*)    Glucose, Bld 123 (*)    BUN 24 (*)    Creatinine, Ser 1.43 (*)    GFR, Estimated 51 (*)    All other components within normal limits  HEMOGLOBIN A1C - Abnormal; Notable for the following components:   Hgb A1c MFr Bld 6.3 (*)    All other components within normal limits  URINALYSIS, ROUTINE W REFLEX MICROSCOPIC - Abnormal; Notable for the following components:   Glucose, UA >=500 (*)    All other components within normal limits  URINALYSIS, MICROSCOPIC (REFLEX) - Abnormal; Notable for the following components:   Bacteria, UA RARE (*)    All other components within normal limits  SURGICAL PCR SCREEN  SARS CORONAVIRUS 2 (TAT 6-24 HRS)  APTT  BLOOD GAS, ARTERIAL  PROTIME-INR  TYPE AND SCREEN     IMAGES: CXR 12/16/21: FINDINGS: The heart size and mediastinal contours are within normal limits. Atherosclerotic calcification of the aorta is noted. No consolidation, effusion, or pneumothorax. Mild degenerative changes in the thoracic spine. IMPRESSION: No active cardiopulmonary disease.  EKG: 11/28/21: NSR. Occasional PACs.   CV: Echo 12/08/21: IMPRESSIONS   1. Left ventricular ejection fraction, by estimation, is 60 to 65%. The  left ventricle has normal function. The left ventricle has no regional  wall motion abnormalities. There is moderate-to-severe hypertrophy of the  basal septum. The rest of the LV  segments demonstrate mild left ventricular hypertrophy. Left ventricular  diastolic parameters are consistent with Grade I diastolic dysfunction  (impaired relaxation).   2. Right ventricular systolic function is normal. The right ventricular  size is normal. Tricuspid regurgitation  signal is inadequate for assessing  PA pressure.   3. The mitral valve is normal in structure. Mild mitral valve  regurgitation.   4. The aortic valve is tricuspid. There is mild calcification of the  aortic valve. There is mild thickening of the aortic valve. Aortic valve  regurgitation is not visualized. Aortic valve sclerosis/calcification is  present, without any evidence of  aortic stenosis.   5. Aortic dilatation noted. 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  - Comparison(s): Compared to prior TTE in 06/2019, there is no significant  change.    Cardiac cath 12/02/21: LM: Mid LM lesion is 50% stenosed. LAD: Prox LAD lesion is 50% stenosis. Dist LAD lesion is 30% stenosed. LCX: Prox Cx lesion is 100% stenosed. The lesion was previously treated. Third OM Branch Collaterals. 3rd Mrg filled by collaterals from 2nd Diag and 1st Diag. RCA: There is mild diffuse disease throughout the vessel.  Mid RCA lesion is 80% stenosed. CONCLUSION:   Prox Cx lesion is 100% stenosed.   Prox LAD lesion is 50% stenosed.   Mid LM lesion is 50% stenosed.   Dist LAD lesion is 30% stenosed.   Mid RCA lesion is 80% stenosed.   LV end diastolic pressure is normal.   The left ventricular ejection fraction is 55-65% by visual estimate.   1.  Multivessel disease consisting of left main, LAD, collateralized chronically occluded left circumflex, and high-grade right coronary disease. 2.  Preserved ejection fraction with LVEDP of 15 mmHg. 3.  The patient will be referred for outpatient cardiothoracic surgical evaluation.   Cardiac event monitor 06/25/19-07/24/19: Study Highlights Sinus rhythm including NSR and sinus brady He had 1 episode of complete heart block that lasted 4 seconds He has a history of syncope. Will refer to EP    Past Medical History:  Diagnosis Date   Arthritis    Colon polyp    adenomatous   Coronary artery disease    post PTCA and stenting    CORONARY ATHEROSCLEROSIS NATIVE CORONARY ARTERY 12/21/2009   Diabetes mellitus    Diverticulosis    History of kidney stones    Hyperlipidemia    Hypertension     Past Surgical History:  Procedure Laterality Date   CORONARY ANGIOPLASTY WITH STENT PLACEMENT     left circumflex artery   LEFT HEART CATH AND CORONARY ANGIOGRAPHY N/A 12/02/2021   Procedure: LEFT HEART CATH AND CORONARY ANGIOGRAPHY;  Surgeon: Early Osmond, MD;  Location: [place] [facility];  Service: Cardiovascular;  Laterality: N/A;    MEDICATIONS:  aspirin 81 MG tablet   b complex vitamins capsule   fenofibrate micronized (LOFIBRA) 134 MG capsule   insulin aspart (NOVOLOG) 100 UNIT/ML injection   Insulin Human (INSULIN PUMP) SOLN   JARDIANCE 25 MG TABS tablet   metFORMIN (GLUCOPHAGE) 850 MG tablet   multivitamin (ONE-A-DAY MEN'S) TABS tablet   nitroGLYCERIN (NITROSTAT) 0.4 MG SL  tablet   rosuvastatin (CRESTOR) 40 MG tablet   sodium chloride (OCEAN) 0.65 % SOLN nasal spray   tobramycin (TOBREX) 0.3 % ophthalmic solution   No current facility-administered medications for this encounter.     Myra Gianotti, PA-C Surgical Short Stay/Anesthesiology [facility] Phone [phone] [facility] Phone [phone] 12/19/2021 9:57 AM

## 2021-12-20 ENCOUNTER — Encounter (HOSPITAL_COMMUNITY): Payer: Self-pay | Admitting: Thoracic Surgery (Cardiothoracic Vascular Surgery)

## 2021-12-20 ENCOUNTER — Other Ambulatory Visit: Payer: Self-pay

## 2021-12-20 ENCOUNTER — Inpatient Hospital Stay (HOSPITAL_COMMUNITY): Payer: Medicare Other | Admitting: Anesthesiology

## 2021-12-20 ENCOUNTER — Inpatient Hospital Stay (HOSPITAL_COMMUNITY): Payer: Medicare Other

## 2021-12-20 ENCOUNTER — Inpatient Hospital Stay (HOSPITAL_COMMUNITY)
Admission: RE | Disposition: A | Discharge status: Home Health | Payer: Self-pay | Source: Home / Self Care | Attending: Thoracic Surgery (Cardiothoracic Vascular Surgery)

## 2021-12-20 ENCOUNTER — Inpatient Hospital Stay (HOSPITAL_COMMUNITY)
Admission: RE | Admit: 2021-12-20 | Discharge: 2021-12-27 | DRG: 235 | Disposition: A | Discharge status: Home Health | Payer: Medicare Other | Attending: Thoracic Surgery (Cardiothoracic Vascular Surgery) | Admitting: Thoracic Surgery (Cardiothoracic Vascular Surgery)

## 2021-12-20 DIAGNOSIS — I700 Atherosclerosis of aorta: Secondary | ICD-10-CM | POA: Diagnosis not present

## 2021-12-20 DIAGNOSIS — E669 Obesity, unspecified: Secondary | ICD-10-CM | POA: Diagnosis present

## 2021-12-20 DIAGNOSIS — I2510 Atherosclerotic heart disease of native coronary artery without angina pectoris: Principal | ICD-10-CM | POA: Diagnosis present

## 2021-12-20 DIAGNOSIS — I472 Ventricular tachycardia, unspecified: Secondary | ICD-10-CM | POA: Diagnosis not present

## 2021-12-20 DIAGNOSIS — Z800 Family history of malignant neoplasm of digestive organs: Secondary | ICD-10-CM

## 2021-12-20 DIAGNOSIS — I129 Hypertensive chronic kidney disease with stage 1 through stage 4 chronic kidney disease, or unspecified chronic kidney disease: Secondary | ICD-10-CM | POA: Diagnosis present

## 2021-12-20 DIAGNOSIS — M1990 Unspecified osteoarthritis, unspecified site: Secondary | ICD-10-CM | POA: Diagnosis present

## 2021-12-20 DIAGNOSIS — N179 Acute kidney failure, unspecified: Secondary | ICD-10-CM | POA: Diagnosis not present

## 2021-12-20 DIAGNOSIS — J939 Pneumothorax, unspecified: Secondary | ICD-10-CM

## 2021-12-20 DIAGNOSIS — Z87442 Personal history of urinary calculi: Secondary | ICD-10-CM

## 2021-12-20 DIAGNOSIS — Z9889 Other specified postprocedural states: Secondary | ICD-10-CM | POA: Diagnosis not present

## 2021-12-20 DIAGNOSIS — Z955 Presence of coronary angioplasty implant and graft: Secondary | ICD-10-CM

## 2021-12-20 DIAGNOSIS — Z86010 Personal history of colonic polyps: Secondary | ICD-10-CM

## 2021-12-20 DIAGNOSIS — D1809 Hemangioma of other sites: Secondary | ICD-10-CM | POA: Diagnosis present

## 2021-12-20 DIAGNOSIS — I4901 Ventricular fibrillation: Secondary | ICD-10-CM | POA: Diagnosis not present

## 2021-12-20 DIAGNOSIS — D62 Acute posthemorrhagic anemia: Secondary | ICD-10-CM | POA: Diagnosis not present

## 2021-12-20 DIAGNOSIS — Z951 Presence of aortocoronary bypass graft: Secondary | ICD-10-CM | POA: Diagnosis not present

## 2021-12-20 DIAGNOSIS — F05 Delirium due to known physiological condition: Secondary | ICD-10-CM | POA: Diagnosis not present

## 2021-12-20 DIAGNOSIS — R310 Gross hematuria: Secondary | ICD-10-CM | POA: Diagnosis not present

## 2021-12-20 DIAGNOSIS — N189 Chronic kidney disease, unspecified: Secondary | ICD-10-CM | POA: Diagnosis not present

## 2021-12-20 DIAGNOSIS — I440 Atrioventricular block, first degree: Secondary | ICD-10-CM | POA: Diagnosis not present

## 2021-12-20 DIAGNOSIS — Z09 Encounter for follow-up examination after completed treatment for conditions other than malignant neoplasm: Secondary | ICD-10-CM

## 2021-12-20 DIAGNOSIS — E119 Type 2 diabetes mellitus without complications: Secondary | ICD-10-CM | POA: Diagnosis not present

## 2021-12-20 DIAGNOSIS — I4892 Unspecified atrial flutter: Secondary | ICD-10-CM | POA: Diagnosis not present

## 2021-12-20 DIAGNOSIS — I959 Hypotension, unspecified: Secondary | ICD-10-CM | POA: Diagnosis not present

## 2021-12-20 DIAGNOSIS — J90 Pleural effusion, not elsewhere classified: Secondary | ICD-10-CM | POA: Diagnosis not present

## 2021-12-20 DIAGNOSIS — Z7982 Long term (current) use of aspirin: Secondary | ICD-10-CM

## 2021-12-20 DIAGNOSIS — Z7984 Long term (current) use of oral hypoglycemic drugs: Secondary | ICD-10-CM

## 2021-12-20 DIAGNOSIS — I480 Paroxysmal atrial fibrillation: Secondary | ICD-10-CM | POA: Diagnosis not present

## 2021-12-20 DIAGNOSIS — I10 Essential (primary) hypertension: Secondary | ICD-10-CM | POA: Diagnosis not present

## 2021-12-20 DIAGNOSIS — Z9641 Presence of insulin pump (external) (internal): Secondary | ICD-10-CM | POA: Diagnosis present

## 2021-12-20 DIAGNOSIS — Z4682 Encounter for fitting and adjustment of non-vascular catheter: Secondary | ICD-10-CM | POA: Diagnosis not present

## 2021-12-20 DIAGNOSIS — E1122 Type 2 diabetes mellitus with diabetic chronic kidney disease: Secondary | ICD-10-CM | POA: Diagnosis present

## 2021-12-20 DIAGNOSIS — J9811 Atelectasis: Secondary | ICD-10-CM | POA: Diagnosis not present

## 2021-12-20 DIAGNOSIS — E785 Hyperlipidemia, unspecified: Secondary | ICD-10-CM | POA: Diagnosis present

## 2021-12-20 DIAGNOSIS — E1165 Type 2 diabetes mellitus with hyperglycemia: Secondary | ICD-10-CM | POA: Diagnosis not present

## 2021-12-20 DIAGNOSIS — Z87891 Personal history of nicotine dependence: Secondary | ICD-10-CM

## 2021-12-20 DIAGNOSIS — I517 Cardiomegaly: Secondary | ICD-10-CM | POA: Diagnosis not present

## 2021-12-20 DIAGNOSIS — R001 Bradycardia, unspecified: Secondary | ICD-10-CM | POA: Diagnosis not present

## 2021-12-20 DIAGNOSIS — Z79899 Other long term (current) drug therapy: Secondary | ICD-10-CM

## 2021-12-20 DIAGNOSIS — Z794 Long term (current) use of insulin: Secondary | ICD-10-CM

## 2021-12-20 DIAGNOSIS — R918 Other nonspecific abnormal finding of lung field: Secondary | ICD-10-CM | POA: Diagnosis not present

## 2021-12-20 DIAGNOSIS — Z6832 Body mass index (BMI) 32.0-32.9, adult: Secondary | ICD-10-CM

## 2021-12-20 DIAGNOSIS — I4891 Unspecified atrial fibrillation: Secondary | ICD-10-CM | POA: Diagnosis not present

## 2021-12-20 HISTORY — PX: ENDOVEIN HARVEST OF GREATER SAPHENOUS VEIN: SHX5059

## 2021-12-20 HISTORY — PX: TEE WITHOUT CARDIOVERSION: SHX5443

## 2021-12-20 HISTORY — PX: CORONARY ARTERY BYPASS GRAFT: SHX141

## 2021-12-20 LAB — GLUCOSE, CAPILLARY
Glucose-Capillary: 165 mg/dL — ABNORMAL HIGH (ref 70–99)
Glucose-Capillary: 138 mg/dL — ABNORMAL HIGH (ref 70–99)
Glucose-Capillary: 96 mg/dL (ref 70–99)
Glucose-Capillary: 119 mg/dL — ABNORMAL HIGH (ref 70–99)
Glucose-Capillary: 113 mg/dL — ABNORMAL HIGH (ref 70–99)
Glucose-Capillary: 109 mg/dL — ABNORMAL HIGH (ref 70–99)
Glucose-Capillary: 110 mg/dL — ABNORMAL HIGH (ref 70–99)
Glucose-Capillary: 117 mg/dL — ABNORMAL HIGH (ref 70–99)
Glucose-Capillary: 168 mg/dL — ABNORMAL HIGH (ref 70–99)
Glucose-Capillary: 141 mg/dL — ABNORMAL HIGH (ref 70–99)
Glucose-Capillary: 136 mg/dL — ABNORMAL HIGH (ref 70–99)

## 2021-12-20 LAB — POCT I-STAT 7, (LYTES, BLD GAS, ICA,H+H)
Acid-Base Excess: 1 mmol/L (ref 0.0–2.0)
Acid-Base Excess: 1 mmol/L (ref 0.0–2.0)
Acid-Base Excess: 2 mmol/L (ref 0.0–2.0)
Acid-base deficit: 1 mmol/L (ref 0.0–2.0)
Acid-base deficit: 3 mmol/L — ABNORMAL HIGH (ref 0.0–2.0)
Bicarbonate: 24.6 mmol/L (ref 20.0–28.0)
Bicarbonate: 25 mmol/L (ref 20.0–28.0)
Bicarbonate: 25.1 mmol/L (ref 20.0–28.0)
Bicarbonate: 26.1 mmol/L (ref 20.0–28.0)
Bicarbonate: 21.3 mmol/L (ref 20.0–28.0)
Calcium, Ion: 0.95 mmol/L — ABNORMAL LOW (ref 1.15–1.40)
Calcium, Ion: 1.07 mmol/L — ABNORMAL LOW (ref 1.15–1.40)
Calcium, Ion: 1.05 mmol/L — ABNORMAL LOW (ref 1.15–1.40)
Calcium, Ion: 1.05 mmol/L — ABNORMAL LOW (ref 1.15–1.40)
Calcium, Ion: 1.3 mmol/L (ref 1.15–1.40)
HCT: 35 % — ABNORMAL LOW (ref 39.0–52.0)
HCT: 36 % — ABNORMAL LOW (ref 39.0–52.0)
HCT: 33 % — ABNORMAL LOW (ref 39.0–52.0)
HCT: 33 % — ABNORMAL LOW (ref 39.0–52.0)
HCT: 33 % — ABNORMAL LOW (ref 39.0–52.0)
Hemoglobin: 11.9 g/dL — ABNORMAL LOW (ref 13.0–17.0)
Hemoglobin: 12.2 g/dL — ABNORMAL LOW (ref 13.0–17.0)
Hemoglobin: 11.2 g/dL — ABNORMAL LOW (ref 13.0–17.0)
Hemoglobin: 11.2 g/dL — ABNORMAL LOW (ref 13.0–17.0)
Hemoglobin: 11.2 g/dL — ABNORMAL LOW (ref 13.0–17.0)
O2 Saturation: 100 %
O2 Saturation: 100 %
O2 Saturation: 100 %
O2 Saturation: 100 %
O2 Saturation: 96 %
Potassium: 4.7 mmol/L (ref 3.5–5.1)
Potassium: 5.2 mmol/L — ABNORMAL HIGH (ref 3.5–5.1)
Potassium: 5 mmol/L (ref 3.5–5.1)
Potassium: 5.4 mmol/L — ABNORMAL HIGH (ref 3.5–5.1)
Potassium: 4.5 mmol/L (ref 3.5–5.1)
Sodium: 136 mmol/L (ref 135–145)
Sodium: 140 mmol/L (ref 135–145)
Sodium: 138 mmol/L (ref 135–145)
Sodium: 138 mmol/L (ref 135–145)
Sodium: 138 mmol/L (ref 135–145)
TCO2: 26 mmol/L (ref 22–32)
TCO2: 26 mmol/L (ref 22–32)
TCO2: 26 mmol/L (ref 22–32)
TCO2: 27 mmol/L (ref 22–32)
TCO2: 22 mmol/L (ref 22–32)
pCO2 arterial: 37.1 mmHg (ref 32.0–48.0)
pCO2 arterial: 44.9 mmHg (ref 32.0–48.0)
pCO2 arterial: 37 mmHg (ref 32.0–48.0)
pCO2 arterial: 37.1 mmHg (ref 32.0–48.0)
pCO2 arterial: 36.2 mmHg (ref 32.0–48.0)
pH, Arterial: 7.347 — ABNORMAL LOW (ref 7.350–7.450)
pH, Arterial: 7.436 (ref 7.350–7.450)
pH, Arterial: 7.44 (ref 7.350–7.450)
pH, Arterial: 7.455 — ABNORMAL HIGH (ref 7.350–7.450)
pH, Arterial: 7.378 (ref 7.350–7.450)
pO2, Arterial: 284 mmHg — ABNORMAL HIGH (ref 83.0–108.0)
pO2, Arterial: 388 mmHg — ABNORMAL HIGH (ref 83.0–108.0)
pO2, Arterial: 386 mmHg — ABNORMAL HIGH (ref 83.0–108.0)
pO2, Arterial: 412 mmHg — ABNORMAL HIGH (ref 83.0–108.0)
pO2, Arterial: 82 mmHg — ABNORMAL LOW (ref 83.0–108.0)

## 2021-12-20 LAB — CBC
HCT: 42.5 % (ref 39.0–52.0)
HCT: 46.4 % (ref 39.0–52.0)
Hemoglobin: 14.2 g/dL (ref 13.0–17.0)
Hemoglobin: 15.3 g/dL (ref 13.0–17.0)
MCH: 31.6 pg (ref 26.0–34.0)
MCH: 31.5 pg (ref 26.0–34.0)
MCHC: 33.4 g/dL (ref 30.0–36.0)
MCHC: 33 g/dL (ref 30.0–36.0)
MCV: 94.7 fL (ref 80.0–100.0)
MCV: 95.5 fL (ref 80.0–100.0)
Platelets: 170 10*3/uL (ref 150–400)
Platelets: 194 10*3/uL (ref 150–400)
RBC: 4.49 MIL/uL (ref 4.22–5.81)
RBC: 4.86 MIL/uL (ref 4.22–5.81)
RDW: 13.5 % (ref 11.5–15.5)
RDW: 13.8 % (ref 11.5–15.5)
WBC: 22.6 10*3/uL — ABNORMAL HIGH (ref 4.0–10.5)
WBC: 19.3 10*3/uL — ABNORMAL HIGH (ref 4.0–10.5)
nRBC: 0 % (ref 0.0–0.2)
nRBC: 0 % (ref 0.0–0.2)

## 2021-12-20 LAB — POCT I-STAT EG7
Acid-base deficit: 2 mmol/L (ref 0.0–2.0)
Bicarbonate: 23.4 mmol/L (ref 20.0–28.0)
Calcium, Ion: 1.06 mmol/L — ABNORMAL LOW (ref 1.15–1.40)
HCT: 37 % — ABNORMAL LOW (ref 39.0–52.0)
Hemoglobin: 12.6 g/dL — ABNORMAL LOW (ref 13.0–17.0)
O2 Saturation: 82 %
Potassium: 4.1 mmol/L (ref 3.5–5.1)
Sodium: 141 mmol/L (ref 135–145)
TCO2: 25 mmol/L (ref 22–32)
pCO2, Ven: 42.7 mmHg — ABNORMAL LOW (ref 44.0–60.0)
pH, Ven: 7.346 (ref 7.250–7.430)
pO2, Ven: 50 mmHg — ABNORMAL HIGH (ref 32.0–45.0)

## 2021-12-20 LAB — POCT I-STAT, CHEM 8
BUN: 20 mg/dL (ref 8–23)
BUN: 20 mg/dL (ref 8–23)
BUN: 20 mg/dL (ref 8–23)
BUN: 20 mg/dL (ref 8–23)
BUN: 19 mg/dL (ref 8–23)
Calcium, Ion: 1.28 mmol/L (ref 1.15–1.40)
Calcium, Ion: 1.23 mmol/L (ref 1.15–1.40)
Calcium, Ion: 1.07 mmol/L — ABNORMAL LOW (ref 1.15–1.40)
Calcium, Ion: 1.06 mmol/L — ABNORMAL LOW (ref 1.15–1.40)
Calcium, Ion: 1.23 mmol/L (ref 1.15–1.40)
Chloride: 105 mmol/L (ref 98–111)
Chloride: 107 mmol/L (ref 98–111)
Chloride: 104 mmol/L (ref 98–111)
Chloride: 105 mmol/L (ref 98–111)
Chloride: 106 mmol/L (ref 98–111)
Creatinine, Ser: 1.2 mg/dL (ref 0.61–1.24)
Creatinine, Ser: 1.2 mg/dL (ref 0.61–1.24)
Creatinine, Ser: 1.1 mg/dL (ref 0.61–1.24)
Creatinine, Ser: 1.1 mg/dL (ref 0.61–1.24)
Creatinine, Ser: 1.1 mg/dL (ref 0.61–1.24)
Glucose, Bld: 172 mg/dL — ABNORMAL HIGH (ref 70–99)
Glucose, Bld: 164 mg/dL — ABNORMAL HIGH (ref 70–99)
Glucose, Bld: 128 mg/dL — ABNORMAL HIGH (ref 70–99)
Glucose, Bld: 137 mg/dL — ABNORMAL HIGH (ref 70–99)
Glucose, Bld: 184 mg/dL — ABNORMAL HIGH (ref 70–99)
HCT: 45 % (ref 39.0–52.0)
HCT: 45 % (ref 39.0–52.0)
HCT: 35 % — ABNORMAL LOW (ref 39.0–52.0)
HCT: 34 % — ABNORMAL LOW (ref 39.0–52.0)
HCT: 34 % — ABNORMAL LOW (ref 39.0–52.0)
Hemoglobin: 15.3 g/dL (ref 13.0–17.0)
Hemoglobin: 15.3 g/dL (ref 13.0–17.0)
Hemoglobin: 11.9 g/dL — ABNORMAL LOW (ref 13.0–17.0)
Hemoglobin: 11.6 g/dL — ABNORMAL LOW (ref 13.0–17.0)
Hemoglobin: 11.6 g/dL — ABNORMAL LOW (ref 13.0–17.0)
Potassium: 4.4 mmol/L (ref 3.5–5.1)
Potassium: 4.3 mmol/L (ref 3.5–5.1)
Potassium: 5 mmol/L (ref 3.5–5.1)
Potassium: 5 mmol/L (ref 3.5–5.1)
Potassium: 4.5 mmol/L (ref 3.5–5.1)
Sodium: 141 mmol/L (ref 135–145)
Sodium: 140 mmol/L (ref 135–145)
Sodium: 136 mmol/L (ref 135–145)
Sodium: 138 mmol/L (ref 135–145)
Sodium: 137 mmol/L (ref 135–145)
TCO2: 25 mmol/L (ref 22–32)
TCO2: 26 mmol/L (ref 22–32)
TCO2: 26 mmol/L (ref 22–32)
TCO2: 27 mmol/L (ref 22–32)
TCO2: 22 mmol/L (ref 22–32)

## 2021-12-20 LAB — BASIC METABOLIC PANEL
Anion gap: 7 (ref 5–15)
BUN: 17 mg/dL (ref 8–23)
CO2: 20 mmol/L — ABNORMAL LOW (ref 22–32)
Calcium: 8 mg/dL — ABNORMAL LOW (ref 8.9–10.3)
Chloride: 113 mmol/L — ABNORMAL HIGH (ref 98–111)
Creatinine, Ser: 1.3 mg/dL — ABNORMAL HIGH (ref 0.61–1.24)
GFR, Estimated: 57 mL/min — ABNORMAL LOW (ref >60)
Glucose, Bld: 135 mg/dL — ABNORMAL HIGH (ref 70–99)
Potassium: 4.6 mmol/L (ref 3.5–5.1)
Sodium: 140 mmol/L (ref 135–145)

## 2021-12-20 LAB — MAGNESIUM: Magnesium: 3.3 mg/dL — ABNORMAL HIGH (ref 1.7–2.4)

## 2021-12-20 LAB — PLATELET COUNT: Platelets: 134 10*3/uL — ABNORMAL LOW (ref 150–400)

## 2021-12-20 LAB — HEMOGLOBIN AND HEMATOCRIT, BLOOD
HCT: 35 % — ABNORMAL LOW (ref 39.0–52.0)
Hemoglobin: 11.9 g/dL — ABNORMAL LOW (ref 13.0–17.0)

## 2021-12-20 LAB — APTT: aPTT: 28 seconds (ref 24–36)

## 2021-12-20 LAB — PROTIME-INR
INR: 1.3 — ABNORMAL HIGH (ref 0.8–1.2)
Prothrombin Time: 15.9 seconds — ABNORMAL HIGH (ref 11.4–15.2)

## 2021-12-20 SURGERY — CORONARY ARTERY BYPASS GRAFTING (CABG)
Anesthesia: General | Site: Leg Lower | Laterality: Right

## 2021-12-20 MED ORDER — ALBUMIN HUMAN 5 % IV SOLN
250.0000 mL | INTRAVENOUS | Status: AC | PRN
  Administered 2021-12-20: 12.5 g via INTRAVENOUS
  Filled 2021-12-20: qty 250

## 2021-12-20 MED ORDER — SODIUM CHLORIDE 0.9 % IV SOLN: INTRAVENOUS | Status: DC

## 2021-12-20 MED ORDER — FENTANYL CITRATE (PF) 250 MCG/5ML IJ SOLN
INTRAMUSCULAR | Status: AC
  Filled 2021-12-20: qty 5

## 2021-12-20 MED ORDER — PROTAMINE SULFATE 10 MG/ML IV SOLN
INTRAVENOUS | Status: DC | PRN
  Administered 2021-12-20: 300 mg via INTRAVENOUS

## 2021-12-20 MED ORDER — TRAMADOL HCL 50 MG PO TABS
50.0000 mg | ORAL_TABLET | ORAL | Status: DC | PRN
  Administered 2021-12-21 – 2021-12-27 (×8): 50 mg via ORAL
  Filled 2021-12-20 (×8): qty 1

## 2021-12-20 MED ORDER — PHENYLEPHRINE 40 MCG/ML (10ML) SYRINGE FOR IV PUSH (FOR BLOOD PRESSURE SUPPORT)
PREFILLED_SYRINGE | INTRAVENOUS | Status: AC
  Filled 2021-12-20: qty 10

## 2021-12-20 MED ORDER — CEFAZOLIN SODIUM-DEXTROSE 2-4 GM/100ML-% IV SOLN
2.0000 g | Freq: Three times a day (TID) | INTRAVENOUS | Status: AC
  Administered 2021-12-20 – 2021-12-22 (×6): 2 g via INTRAVENOUS
  Filled 2021-12-20 (×7): qty 100

## 2021-12-20 MED ORDER — PHENYLEPHRINE HCL-NACL 20-0.9 MG/250ML-% IV SOLN
0.0000 ug/min | INTRAVENOUS | Status: DC
  Administered 2021-12-20: 20 ug/min via INTRAVENOUS
  Administered 2021-12-21 (×2): 35 ug/min via INTRAVENOUS
  Filled 2021-12-20: qty 250

## 2021-12-20 MED ORDER — 0.9 % SODIUM CHLORIDE (POUR BTL) OPTIME
TOPICAL | Status: DC | PRN
  Administered 2021-12-20: 5000 mL

## 2021-12-20 MED ORDER — PROPOFOL 10 MG/ML IV BOLUS
INTRAVENOUS | Status: DC | PRN
  Administered 2021-12-20: 10 mg via INTRAVENOUS
  Administered 2021-12-20: 20 mg via INTRAVENOUS
  Administered 2021-12-20: 30 mg via INTRAVENOUS

## 2021-12-20 MED ORDER — HEPARIN SODIUM (PORCINE) 1000 UNIT/ML IJ SOLN
INTRAMUSCULAR | Status: DC | PRN
  Administered 2021-12-20: 34000 [IU] via INTRAVENOUS

## 2021-12-20 MED ORDER — LIDOCAINE 2% (20 MG/ML) 5 ML SYRINGE
INTRAMUSCULAR | Status: AC
  Filled 2021-12-20: qty 5

## 2021-12-20 MED ORDER — ASPIRIN 81 MG PO CHEW: 324.0000 mg | CHEWABLE_TABLET | Freq: Every day | ORAL | Status: DC

## 2021-12-20 MED ORDER — DEXMEDETOMIDINE HCL IN NACL 400 MCG/100ML IV SOLN
0.0000 ug/kg/h | INTRAVENOUS | Status: DC
  Administered 2021-12-20: 0.7 ug/kg/h via INTRAVENOUS
  Filled 2021-12-20: qty 100

## 2021-12-20 MED ORDER — PANTOPRAZOLE SODIUM 40 MG PO TBEC
40.0000 mg | DELAYED_RELEASE_TABLET | Freq: Every day | ORAL | Status: DC
  Administered 2021-12-22 – 2021-12-27 (×6): 40 mg via ORAL
  Filled 2021-12-20 (×6): qty 1

## 2021-12-20 MED ORDER — SODIUM CHLORIDE 0.9 % IV SOLN: INTRAVENOUS | Status: DC | PRN

## 2021-12-20 MED ORDER — SODIUM CHLORIDE 0.9% FLUSH
10.0000 mL | Freq: Two times a day (BID) | INTRAVENOUS | Status: DC
  Administered 2021-12-20 – 2021-12-26 (×11): 10 mL

## 2021-12-20 MED ORDER — CHLORHEXIDINE GLUCONATE 0.12 % MT SOLN
15.0000 mL | OROMUCOSAL | Status: AC
  Administered 2021-12-20: 15 mL via OROMUCOSAL
  Filled 2021-12-20: qty 15

## 2021-12-20 MED ORDER — OXYCODONE HCL 5 MG PO TABS
5.0000 mg | ORAL_TABLET | ORAL | Status: DC | PRN
  Administered 2021-12-20: 5 mg via ORAL
  Administered 2021-12-20: 10 mg via ORAL
  Administered 2021-12-21: 5 mg via ORAL
  Filled 2021-12-20 (×2): qty 1
  Filled 2021-12-20: qty 2

## 2021-12-20 MED ORDER — ROCURONIUM BROMIDE 10 MG/ML (PF) SYRINGE
PREFILLED_SYRINGE | INTRAVENOUS | Status: AC
  Filled 2021-12-20: qty 10

## 2021-12-20 MED ORDER — LACTATED RINGERS IV SOLN: INTRAVENOUS | Status: DC

## 2021-12-20 MED ORDER — ACETAMINOPHEN 650 MG RE SUPP: 650.0000 mg | Freq: Once | RECTAL | Status: AC

## 2021-12-20 MED ORDER — NITROGLYCERIN IN D5W 200-5 MCG/ML-% IV SOLN: 0.0000 ug/min | INTRAVENOUS | Status: DC

## 2021-12-20 MED ORDER — BISACODYL 5 MG PO TBEC
10.0000 mg | DELAYED_RELEASE_TABLET | Freq: Every day | ORAL | Status: DC
  Administered 2021-12-21 – 2021-12-26 (×6): 10 mg via ORAL
  Filled 2021-12-20 (×6): qty 2

## 2021-12-20 MED ORDER — ROSUVASTATIN CALCIUM 20 MG PO TABS
40.0000 mg | ORAL_TABLET | Freq: Every day | ORAL | Status: DC
  Administered 2021-12-21 – 2021-12-27 (×7): 40 mg via ORAL
  Filled 2021-12-20 (×7): qty 2

## 2021-12-20 MED ORDER — ONDANSETRON HCL 4 MG/2ML IJ SOLN
INTRAMUSCULAR | Status: AC
  Filled 2021-12-20: qty 2

## 2021-12-20 MED ORDER — INSULIN REGULAR(HUMAN) IN NACL 100-0.9 UT/100ML-% IV SOLN
INTRAVENOUS | Status: DC
  Administered 2021-12-20: 5.5 [IU]/h via INTRAVENOUS

## 2021-12-20 MED ORDER — ~~LOC~~ CARDIAC SURGERY, PATIENT & FAMILY EDUCATION
Freq: Once | Status: DC
  Filled 2021-12-20: qty 1

## 2021-12-20 MED ORDER — ONDANSETRON HCL 4 MG/2ML IJ SOLN
INTRAMUSCULAR | Status: DC | PRN
  Administered 2021-12-20: 4 mg via INTRAVENOUS

## 2021-12-20 MED ORDER — LACTATED RINGERS IV SOLN
INTRAVENOUS | Status: DC | PRN
Start: 2021-12-20 — End: 2021-12-20

## 2021-12-20 MED ORDER — BISACODYL 10 MG RE SUPP: 10.0000 mg | Freq: Every day | RECTAL | Status: DC

## 2021-12-20 MED ORDER — MORPHINE SULFATE (PF) 2 MG/ML IV SOLN
1.0000 mg | INTRAVENOUS | Status: DC | PRN
  Administered 2021-12-20 – 2021-12-22 (×4): 2 mg via INTRAVENOUS
  Filled 2021-12-20 (×4): qty 1

## 2021-12-20 MED ORDER — ORAL CARE MOUTH RINSE
15.0000 mL | OROMUCOSAL | Status: DC
  Administered 2021-12-20 – 2021-12-21 (×5): 15 mL via OROMUCOSAL

## 2021-12-20 MED ORDER — PROTAMINE SULFATE 10 MG/ML IV SOLN
INTRAVENOUS | Status: AC
  Filled 2021-12-20: qty 5

## 2021-12-20 MED ORDER — ADULT MULTIVITAMIN W/MINERALS CH
1.0000 | ORAL_TABLET | Freq: Every day | ORAL | Status: DC
  Administered 2021-12-21 – 2021-12-27 (×7): 1 via ORAL
  Filled 2021-12-20 (×7): qty 1

## 2021-12-20 MED ORDER — CHLORHEXIDINE GLUCONATE 4 % EX LIQD: 30.0000 mL | CUTANEOUS | Status: DC

## 2021-12-20 MED ORDER — ACETAMINOPHEN 500 MG PO TABS
1000.0000 mg | ORAL_TABLET | Freq: Four times a day (QID) | ORAL | Status: AC
  Administered 2021-12-21 – 2021-12-25 (×19): 1000 mg via ORAL
  Filled 2021-12-20 (×19): qty 2

## 2021-12-20 MED ORDER — PHENYLEPHRINE HCL (PRESSORS) 10 MG/ML IV SOLN
INTRAVENOUS | Status: DC | PRN
Start: 2021-12-20 — End: 2021-12-20
  Administered 2021-12-20: 80 ug via INTRAVENOUS
  Administered 2021-12-20 (×3): 40 ug via INTRAVENOUS
  Administered 2021-12-20: 80 ug via INTRAVENOUS
  Administered 2021-12-20: 40 ug via INTRAVENOUS

## 2021-12-20 MED ORDER — MIDAZOLAM HCL 2 MG/2ML IJ SOLN: 2.0000 mg | INTRAMUSCULAR | Status: DC | PRN

## 2021-12-20 MED ORDER — VANCOMYCIN HCL IN DEXTROSE 1-5 GM/200ML-% IV SOLN
1000.0000 mg | Freq: Once | INTRAVENOUS | Status: AC
  Administered 2021-12-20: 1000 mg via INTRAVENOUS
  Filled 2021-12-20: qty 200

## 2021-12-20 MED ORDER — LACTATED RINGERS IV SOLN: INTRAVENOUS | Status: DC | PRN

## 2021-12-20 MED ORDER — SODIUM CHLORIDE 0.9% FLUSH
3.0000 mL | Freq: Two times a day (BID) | INTRAVENOUS | Status: DC
  Administered 2021-12-21 – 2021-12-22 (×3): 3 mL via INTRAVENOUS

## 2021-12-20 MED ORDER — ASPIRIN EC 325 MG PO TBEC
325.0000 mg | DELAYED_RELEASE_TABLET | Freq: Every day | ORAL | Status: DC
  Administered 2021-12-21 – 2021-12-27 (×7): 325 mg via ORAL
  Filled 2021-12-20 (×7): qty 1

## 2021-12-20 MED ORDER — SODIUM CHLORIDE (PF) 0.9 % IJ SOLN
OROMUCOSAL | Status: DC | PRN
  Administered 2021-12-20 (×2): 4 mL via TOPICAL

## 2021-12-20 MED ORDER — METOPROLOL TARTRATE 5 MG/5ML IV SOLN: 2.5000 mg | INTRAVENOUS | Status: DC | PRN

## 2021-12-20 MED ORDER — FENTANYL CITRATE (PF) 250 MCG/5ML IJ SOLN
INTRAMUSCULAR | Status: DC | PRN
Start: 2021-12-20 — End: 2021-12-20
  Administered 2021-12-20: 100 ug via INTRAVENOUS
  Administered 2021-12-20: 50 ug via INTRAVENOUS
  Administered 2021-12-20: 100 ug via INTRAVENOUS
  Administered 2021-12-20: 250 ug via INTRAVENOUS
  Administered 2021-12-20: 50 ug via INTRAVENOUS
  Administered 2021-12-20: 100 ug via INTRAVENOUS
  Administered 2021-12-20 (×4): 50 ug via INTRAVENOUS
  Administered 2021-12-20: 100 ug via INTRAVENOUS
  Administered 2021-12-20: 150 ug via INTRAVENOUS

## 2021-12-20 MED ORDER — ORAL CARE MOUTH RINSE: 15.0000 mL | Freq: Once | OROMUCOSAL | Status: DC

## 2021-12-20 MED ORDER — SODIUM CHLORIDE 0.45 % IV SOLN: INTRAVENOUS | Status: DC | PRN

## 2021-12-20 MED ORDER — DEXTROSE 50 % IV SOLN: 0.0000 mL | INTRAVENOUS | Status: DC | PRN

## 2021-12-20 MED ORDER — ACETAMINOPHEN 160 MG/5ML PO SOLN: 1000.0000 mg | Freq: Four times a day (QID) | ORAL | Status: AC

## 2021-12-20 MED ORDER — LACTATED RINGERS IV SOLN: 500.0000 mL | Freq: Once | INTRAVENOUS | Status: DC | PRN

## 2021-12-20 MED ORDER — ALBUMIN HUMAN 5 % IV SOLN: INTRAVENOUS | Status: DC | PRN

## 2021-12-20 MED ORDER — CHLORHEXIDINE GLUCONATE 0.12% ORAL RINSE (MEDLINE KIT)
15.0000 mL | Freq: Two times a day (BID) | OROMUCOSAL | Status: DC
  Administered 2021-12-20: 15 mL via OROMUCOSAL

## 2021-12-20 MED ORDER — FAMOTIDINE IN NACL 20-0.9 MG/50ML-% IV SOLN
20.0000 mg | Freq: Two times a day (BID) | INTRAVENOUS | Status: AC
  Administered 2021-12-20 (×2): 20 mg via INTRAVENOUS
  Filled 2021-12-20 (×2): qty 50

## 2021-12-20 MED ORDER — PROTAMINE SULFATE 10 MG/ML IV SOLN
INTRAVENOUS | Status: AC
  Filled 2021-12-20: qty 25

## 2021-12-20 MED ORDER — METOPROLOL TARTRATE 12.5 MG HALF TABLET
12.5000 mg | ORAL_TABLET | Freq: Once | ORAL | Status: AC
  Administered 2021-12-20: 12.5 mg via ORAL
  Filled 2021-12-20: qty 1

## 2021-12-20 MED ORDER — HEPARIN SODIUM (PORCINE) 1000 UNIT/ML IJ SOLN
INTRAMUSCULAR | Status: AC
  Filled 2021-12-20: qty 1

## 2021-12-20 MED ORDER — CHLORHEXIDINE GLUCONATE 0.12 % MT SOLN
15.0000 mL | Freq: Once | OROMUCOSAL | Status: AC
  Administered 2021-12-20 – 2021-12-21 (×2): 15 mL via OROMUCOSAL
  Filled 2021-12-20: qty 15

## 2021-12-20 MED ORDER — EPHEDRINE 5 MG/ML INJ
INTRAVENOUS | Status: AC
  Filled 2021-12-20: qty 5

## 2021-12-20 MED ORDER — CHLORHEXIDINE GLUCONATE CLOTH 2 % EX PADS
6.0000 | MEDICATED_PAD | Freq: Every day | CUTANEOUS | Status: DC
  Administered 2021-12-20 – 2021-12-25 (×6): 6 via TOPICAL

## 2021-12-20 MED ORDER — HEPARIN SODIUM (PORCINE) 1000 UNIT/ML IJ SOLN
INTRAMUSCULAR | Status: AC
  Filled 2021-12-20: qty 10

## 2021-12-20 MED ORDER — ROCURONIUM BROMIDE 10 MG/ML (PF) SYRINGE
PREFILLED_SYRINGE | INTRAVENOUS | Status: DC | PRN
  Administered 2021-12-20: 50 mg via INTRAVENOUS
  Administered 2021-12-20: 100 mg via INTRAVENOUS
  Administered 2021-12-20: 50 mg via INTRAVENOUS

## 2021-12-20 MED ORDER — ONDANSETRON HCL 4 MG/2ML IJ SOLN
4.0000 mg | Freq: Four times a day (QID) | INTRAMUSCULAR | Status: DC | PRN
  Administered 2021-12-20: 4 mg via INTRAVENOUS
  Filled 2021-12-20: qty 2

## 2021-12-20 MED ORDER — AMIODARONE HCL IN DEXTROSE 360-4.14 MG/200ML-% IV SOLN
INTRAVENOUS | Status: DC | PRN
Start: 2021-12-20 — End: 2021-12-20
  Administered 2021-12-20: 60 mg/h via INTRAVENOUS

## 2021-12-20 MED ORDER — PROPOFOL 10 MG/ML IV BOLUS
INTRAVENOUS | Status: AC
  Filled 2021-12-20: qty 20

## 2021-12-20 MED ORDER — LIDOCAINE HCL (CARDIAC) PF 100 MG/5ML IV SOSY
PREFILLED_SYRINGE | INTRAVENOUS | Status: DC | PRN
  Administered 2021-12-20: 100 mg via INTRAVENOUS

## 2021-12-20 MED ORDER — DOCUSATE SODIUM 100 MG PO CAPS
200.0000 mg | ORAL_CAPSULE | Freq: Every day | ORAL | Status: DC
  Administered 2021-12-21 – 2021-12-26 (×6): 200 mg via ORAL
  Filled 2021-12-20 (×6): qty 2

## 2021-12-20 MED ORDER — MIDAZOLAM HCL (PF) 10 MG/2ML IJ SOLN
INTRAMUSCULAR | Status: AC
  Filled 2021-12-20: qty 2

## 2021-12-20 MED ORDER — ACETAMINOPHEN 160 MG/5ML PO SOLN
650.0000 mg | Freq: Once | ORAL | Status: AC
  Administered 2021-12-20: 650 mg
  Filled 2021-12-20: qty 20.3

## 2021-12-20 MED ORDER — AMIODARONE HCL IN DEXTROSE 360-4.14 MG/200ML-% IV SOLN
60.0000 mg/h | INTRAVENOUS | Status: AC
  Administered 2021-12-20 (×2): 60 mg/h via INTRAVENOUS
  Filled 2021-12-20: qty 200

## 2021-12-20 MED ORDER — SODIUM CHLORIDE 0.9% FLUSH: 3.0000 mL | INTRAVENOUS | Status: DC | PRN

## 2021-12-20 MED ORDER — SODIUM CHLORIDE 0.9 % IV SOLN: 250.0000 mL | INTRAVENOUS | Status: DC

## 2021-12-20 MED ORDER — MIDAZOLAM HCL (PF) 5 MG/ML IJ SOLN
INTRAMUSCULAR | Status: DC | PRN
  Administered 2021-12-20: 2 mg via INTRAVENOUS
  Administered 2021-12-20 (×3): 1 mg via INTRAVENOUS

## 2021-12-20 MED ORDER — SODIUM CHLORIDE 0.9% FLUSH: 10.0000 mL | INTRAVENOUS | Status: DC | PRN

## 2021-12-20 MED ORDER — POTASSIUM CHLORIDE 10 MEQ/50ML IV SOLN
10.0000 meq | INTRAVENOUS | Status: AC
  Filled 2021-12-20: qty 50

## 2021-12-20 MED ORDER — PLASMA-LYTE A IV SOLN: INTRAVENOUS | Status: DC | PRN

## 2021-12-20 MED ORDER — AMIODARONE HCL IN DEXTROSE 360-4.14 MG/200ML-% IV SOLN
30.0000 mg/h | INTRAVENOUS | Status: DC
  Administered 2021-12-20 – 2021-12-21 (×2): 30 mg/h via INTRAVENOUS
  Filled 2021-12-20: qty 200

## 2021-12-20 MED ORDER — FENOFIBRATE 160 MG PO TABS
160.0000 mg | ORAL_TABLET | Freq: Every day | ORAL | Status: DC
  Administered 2021-12-21 – 2021-12-27 (×7): 160 mg via ORAL
  Filled 2021-12-20 (×7): qty 1

## 2021-12-20 MED ORDER — METOPROLOL TARTRATE 25 MG/10 ML ORAL SUSPENSION
12.5000 mg | Freq: Two times a day (BID) | ORAL | Status: DC
  Administered 2021-12-20: 12.5 mg
  Filled 2021-12-20: qty 5

## 2021-12-20 MED ORDER — METOPROLOL TARTRATE 12.5 MG HALF TABLET
12.5000 mg | ORAL_TABLET | Freq: Two times a day (BID) | ORAL | Status: DC
  Filled 2021-12-20: qty 1

## 2021-12-20 MED ORDER — MAGNESIUM SULFATE 4 GM/100ML IV SOLN
4.0000 g | Freq: Once | INTRAVENOUS | Status: AC
  Administered 2021-12-20: 4 g via INTRAVENOUS
  Filled 2021-12-20: qty 100

## 2021-12-20 MED ORDER — ARTIFICIAL TEARS OPHTHALMIC OINT
TOPICAL_OINTMENT | OPHTHALMIC | Status: DC | PRN
  Administered 2021-12-20: 1 via OPHTHALMIC

## 2021-12-20 MED ORDER — CHLORHEXIDINE GLUCONATE 0.12 % MT SOLN: 15.0000 mL | Freq: Once | OROMUCOSAL | Status: DC

## 2021-12-20 MED ORDER — NICARDIPINE HCL IN NACL 20-0.86 MG/200ML-% IV SOLN: 0.0000 mg/h | INTRAVENOUS | Status: DC

## 2021-12-20 SURGICAL SUPPLY — 83 items
ADH SKN CLS APL DERMABOND .7 (GAUZE/BANDAGES/DRESSINGS) ×3
BAG DECANTER FOR FLEXI CONT (MISCELLANEOUS) ×4 IMPLANT
BLADE CLIPPER SURG (BLADE) ×4 IMPLANT
BLADE STERNUM SYSTEM 6 (BLADE) ×4 IMPLANT
BNDG ELASTIC 4X5.8 VLCR STR LF (GAUZE/BANDAGES/DRESSINGS) ×1 IMPLANT
BNDG ELASTIC 6X5.8 VLCR STR LF (GAUZE/BANDAGES/DRESSINGS) ×1 IMPLANT
BNDG GAUZE ELAST 4 BULKY (GAUZE/BANDAGES/DRESSINGS) ×1 IMPLANT
CABLE SURGICAL S-101-97-12 (CABLE) ×4 IMPLANT
CANISTER SUCT 3000ML PPV (MISCELLANEOUS) ×4 IMPLANT
CANNULA MC2 2 STG 29/37 NON-V (CANNULA) ×3 IMPLANT
CANNULA MC2 TWO STAGE (CANNULA) ×4
CANNULA NON VENT 20FR 12 (CANNULA) ×4 IMPLANT
CATH ROBINSON RED A/P 18FR (CATHETERS) ×8 IMPLANT
CLIP RETRACTION 3.0MM CORONARY (MISCELLANEOUS) ×4 IMPLANT
CONN ST 1/2X1/2  BEN (MISCELLANEOUS) ×4
CONN ST 1/2X1/2 BEN (MISCELLANEOUS) ×3 IMPLANT
CONNECTOR BLAKE 2:1 CARIO BLK (MISCELLANEOUS) ×4 IMPLANT
CONTAINER PROTECT SURGISLUSH (MISCELLANEOUS) ×8 IMPLANT
DERMABOND ADVANCED (GAUZE/BANDAGES/DRESSINGS) ×1
DERMABOND ADVANCED .7 DNX12 (GAUZE/BANDAGES/DRESSINGS) IMPLANT
DRAIN CHANNEL 19F RND (DRAIN) ×11 IMPLANT
DRAIN CONNECTOR BLAKE 1:1 (MISCELLANEOUS) ×4 IMPLANT
DRAPE CARDIOVASCULAR INCISE (DRAPES) ×4
DRAPE SRG 135X102X78XABS (DRAPES) ×3 IMPLANT
DRAPE WARM FLUID 44X44 (DRAPES) ×4 IMPLANT
DRSG AQUACEL AG ADV 3.5X10 (GAUZE/BANDAGES/DRESSINGS) ×1 IMPLANT
DRSG COVADERM 4X14 (GAUZE/BANDAGES/DRESSINGS) ×4 IMPLANT
ELECT BLADE 4.0 EZ CLEAN MEGAD (MISCELLANEOUS) ×4
ELECT REM PT RETURN 9FT ADLT (ELECTROSURGICAL) ×8
ELECTRODE BLDE 4.0 EZ CLN MEGD (MISCELLANEOUS) ×3 IMPLANT
ELECTRODE REM PT RTRN 9FT ADLT (ELECTROSURGICAL) ×6 IMPLANT
FELT TEFLON 1X6 (MISCELLANEOUS) ×8 IMPLANT
GAUZE 4X4 16PLY ~~LOC~~+RFID DBL (SPONGE) ×4 IMPLANT
GAUZE SPONGE 4X4 12PLY STRL (GAUZE/BANDAGES/DRESSINGS) ×8 IMPLANT
GLOVE SURG ENC MOIS LTX SZ7 (GLOVE) ×8 IMPLANT
GLOVE SURG ENC TEXT LTX SZ7.5 (GLOVE) ×8 IMPLANT
GOWN STRL REUS W/ TWL LRG LVL3 (GOWN DISPOSABLE) ×12 IMPLANT
GOWN STRL REUS W/ TWL XL LVL3 (GOWN DISPOSABLE) ×6 IMPLANT
GOWN STRL REUS W/TWL LRG LVL3 (GOWN DISPOSABLE) ×16
GOWN STRL REUS W/TWL XL LVL3 (GOWN DISPOSABLE) ×8
HEMOSTAT POWDER SURGIFOAM 1G (HEMOSTASIS) ×12 IMPLANT
INSERT SUTURE HOLDER (MISCELLANEOUS) ×4 IMPLANT
KIT BASIN OR (CUSTOM PROCEDURE TRAY) ×4 IMPLANT
KIT SUCTION CATH 14FR (SUCTIONS) ×4 IMPLANT
KIT TURNOVER KIT B (KITS) ×4 IMPLANT
KIT VASOVIEW HEMOPRO 2 VH 4000 (KITS) ×4 IMPLANT
LEAD PACING MYOCARDI (MISCELLANEOUS) ×4 IMPLANT
MARKER GRAFT CORONARY BYPASS (MISCELLANEOUS) ×12 IMPLANT
NS IRRIG 1000ML POUR BTL (IV SOLUTION) ×20 IMPLANT
PACK ACCESSORY CANNULA KIT (KITS) ×4 IMPLANT
PACK E OPEN HEART (SUTURE) ×4 IMPLANT
PACK OPEN HEART (CUSTOM PROCEDURE TRAY) ×4 IMPLANT
PAD ARMBOARD 7.5X6 YLW CONV (MISCELLANEOUS) ×8 IMPLANT
PAD ELECT DEFIB RADIOL ZOLL (MISCELLANEOUS) ×4 IMPLANT
PENCIL BUTTON HOLSTER BLD 10FT (ELECTRODE) ×4 IMPLANT
POSITIONER HEAD DONUT 9IN (MISCELLANEOUS) ×4 IMPLANT
PUNCH AORTIC ROTATE 4.0MM (MISCELLANEOUS) ×1 IMPLANT
SET MPS 3-ND DEL (MISCELLANEOUS) ×1 IMPLANT
SPONGE T-LAP 18X18 ~~LOC~~+RFID (SPONGE) ×16 IMPLANT
SUPPORT HEART JANKE-BARRON (MISCELLANEOUS) ×4 IMPLANT
SUT BONE WAX W31G (SUTURE) ×4 IMPLANT
SUT ETHIBOND X763 2 0 SH 1 (SUTURE) ×8 IMPLANT
SUT MNCRL AB 3-0 PS2 18 (SUTURE) ×8 IMPLANT
SUT MNCRL AB 4-0 PS2 18 (SUTURE) ×1 IMPLANT
SUT PDS AB 1 CTX 36 (SUTURE) ×8 IMPLANT
SUT PROLENE 4 0 SH DA (SUTURE) ×4 IMPLANT
SUT PROLENE 5 0 C 1 36 (SUTURE) ×13 IMPLANT
SUT PROLENE 7 0 BV 1 (SUTURE) ×3 IMPLANT
SUT PROLENE 7 0 BV1 MDA (SUTURE) ×4 IMPLANT
SUT STEEL 6MS V (SUTURE) ×8 IMPLANT
SUT STEEL STERNAL CCS#1 18IN (SUTURE) ×3 IMPLANT
SUT TEM PAC WIRE 2 0 SH (SUTURE) ×1 IMPLANT
SUT VIC AB 2-0 CT1 27 (SUTURE) ×4
SUT VIC AB 2-0 CT1 TAPERPNT 27 (SUTURE) IMPLANT
SYSTEM SAHARA CHEST DRAIN ATS (WOUND CARE) ×4 IMPLANT
TAPE CLOTH SURG 4X10 WHT LF (GAUZE/BANDAGES/DRESSINGS) ×2 IMPLANT
TAPE PAPER 2X10 WHT MICROPORE (GAUZE/BANDAGES/DRESSINGS) ×1 IMPLANT
TOWEL GREEN STERILE (TOWEL DISPOSABLE) ×4 IMPLANT
TOWEL GREEN STERILE FF (TOWEL DISPOSABLE) ×4 IMPLANT
TRAY FOLEY SLVR 16FR TEMP STAT (SET/KITS/TRAYS/PACK) ×4 IMPLANT
TUBING LAP HI FLOW INSUFFLATIO (TUBING) ×4 IMPLANT
UNDERPAD 30X36 HEAVY ABSORB (UNDERPADS AND DIAPERS) ×4 IMPLANT
WATER STERILE IRR 1000ML POUR (IV SOLUTION) ×8 IMPLANT

## 2021-12-20 NOTE — Brief Op Note (Signed)
12/20/2021  10:42 AM  PATIENT:  Jason Giles  77 y.o. male  PRE-OPERATIVE DIAGNOSIS:  CORONARY ARTERY DISEASE  POST-OPERATIVE DIAGNOSIS:  CORONARY ARTERY DISEASE  PROCEDURE:  TRANSESOPHAGEAL ECHOCARDIOGRAM (TEE), CORONARY ARTERY BYPASS GRAFTING (CABG) TIMES THREE (LIMA to LAD, SVG to OM1, SVG to PLB) USING LEFT INTERNAL MAMMARY ARTERY AND ENDOSCOPICALLY HARVESTED RIGHT GREATER SAPHENOUS VEIN  RIGHT EVH HARVEST TIME: 24 minutes RIGHT EVH PREP TIME: 14 minutes  SURGEON:  Surgeon(s) and Role:    Lightfoot, Lucile Crater, MD - Primary  PHYSICIAN ASSISTANT: Lars Pinks PA-C  ASSISTANTS: Ara Kussmaul RNFA   ANESTHESIA:   general  EBL:  Per anesthesia, perfusion record  DRAINS:  Chest tubes placed in the right pleural space    COUNTS CORRECT:  YES  DICTATION: .Dragon Dictation  PLAN OF CARE: Admit to inpatient   PATIENT DISPOSITION:  ICU - intubated and hemodynamically stable.   Delay start of Pharmacological VTE agent (>24hrs) due to surgical blood loss or risk of bleeding: no  BASELINE WEIGHT: 104.3 kg

## 2021-12-20 NOTE — Discharge Instructions (Signed)

## 2021-12-20 NOTE — Hospital Course (Addendum)
HPI: This is a 76 year old male with a past medical history of CAD (DES stent 2012), diabetes mellitus, and essential hypertension who was seen by Dr. Kipp Brood in the office for surgical evaluation of three vessel coronary artery disease. This was discovered after he was evaluated after an episode of chest pain while laying in bed.  Since that time he has had no other episodes.  His left heart cath did show left main disease as well as in-stent restenosis in his circumflex stent. He denies any shortness of breath or orthopnea. Dr. Kipp Brood discussed the need for coronary artery disease. Potential risks, benefits, and complications of the surgery were discussed with the patient and he agreed to proceed with surgery. Pre operative carotid duplex US showed no significant internal carotid artery stenosis bilaterally.  Hospital Course: Patient underwent a CABG x 3 . He was transferred from the OR to [facility] ICU in stable condition.  The patient was extubated the evening of surgery.  During his stay in the SICU his blood pressure was low and he required Neo to help with this, which was weaned as BP improved.  The patient had some brief runs of Atrial Fibrillation in the operating room, that required cardioversion.  He was placed on an Amiodarone drip, which will be discontinued on POD #2.  The patient was weaned off insulin drip to IV regimen.  Once oral intake improved he was restarted on his insulin pump. He was stable for transfer to the progressive care unit on 12/22/2021.  The patient developed confusion overnight.  This was felt to be related to sundowning.  Oxycodone was discontinued.  He was able to the sleep better over the next few days and his confusion gradually cleared.  He made slow progress with mobility but eventually regained independence with ambulation and transfers.  He developed acute renal insufficiency after surgery with a peak creatinine of 1.76.  This was trending back toward his baseline by the  fourth postoperative day.  He was having episodes of Atrial Flutter, Fibrillation.  He was on Amiodarone at 400 mg BID.  Cardiology consult was requested to assist with rhythm management and they agreed with continuation of Amiodarone.  The patient maintained NSR with 1st degree AV Block.  He was ambulating without difficulty.  His surgical incisions are healing without evidence of infection.  He is medically stable for discharge home today.

## 2021-12-20 NOTE — Consult Note (Signed)
NAME:  Jason Giles, MRN:  [id number], DOB:  [birth year], LOS: 0 ADMISSION DATE:  12/20/2021, CONSULTATION DATE:  12/20/21 REFERRING MD:  Kipp Brood, CHIEF COMPLAINT:  Postop   History of Present Illness:  76 year old man with hx of CAD post PTCA and stenting, HTN, HLD, DM2, CKD presenting for CABG after 3 vessel disease discovered during recent admit for angina.  Periop course complicated by runs of Vfib stabilized with amiodarone.   Otherwise uneventful.  LIMA to LAD, saphenous- obtuse marginal, saphenous- postieror lateral.  Pertinent  Medical History  CAD with prior PCI CKD DM2 (A1c 6.3%, on home insulin pump) HTN HLD  Significant Hospital Events: Including procedures, antibiotic start and stop dates in addition to other pertinent events   2/7 admit CABG x 3  Interim History / Subjective:  Intubated, sedated  Objective   Blood pressure 99/73, pulse 84, temperature 97.7 F (36.5 C), resp. rate 12, height 5\' 11"  (1.803 m), weight 104.3 kg, SpO2 100 %. CVP:  [11 mmHg-12 mmHg] 11 mmHg  Vent Mode: SIMV;PSV;PRVC FiO2 (%):  [50 %] 50 % Set Rate:  [12 bmp] 12 bmp Vt Set:  [600 mL] 600 mL PEEP:  [5 cmH20] 5 cmH20 Pressure Support:  [10 cmH20] 10 cmH20 Plateau Pressure:  [17 cmH20] 17 cmH20   Intake/Output Summary (Last 24 hours) at 12/20/2021 1420 Last data filed at 12/20/2021 1300 Gross per 24 hour  Intake 3116.22 ml  Output 2495 ml  Net 621.22 ml   Filed Weights   12/20/21 0554  Weight: 104.3 kg    Examination: General: no distress,  HENT: ETT in place, no secretions Lungs: clear bilaterally,  Cardiovascular: RRR, ext warm, sinus on monitor Abdomen: soft, hypoactive Extremities: no edema, no arthritis Neuro: still intubated/sedated/paralyzed Skin: midline sternotomy sit dressed without strikethrough, mediastinal and chest tubes in place with minimal output  Preop Cr 1.4 Hgb 17  Resolved Hospital Problem list   N/a  Assessment & Plan:  Postop ventilator  management CAD POD 0 from 3V CABG (LIMA-LAD, SVG-diag, SVG-posterior lateral) Periop ectopy controlled on amiodarone DM2 on home insulin pump, currently at home Hx HTN  - Usual fastrack extubation pathway - Continue amiodarone - Drain and inotrope management per TCTS - Continue insulin drip, transition to home pump tomorrow? - Will follow while in [place] (right click and "Reselect all SmartList Selections" daily)  Per TCTS  Labs   CBC: Recent Labs  Lab 12/16/21 1536 12/20/21 0757 12/20/21 1020 12/20/21 1039 12/20/21 1106 12/20/21 1109 12/20/21 1133 12/20/21 1136 12/20/21 1248  WBC 10.7*  --   --   --   --   --   --   --  22.6*  HGB 17.2*   < > 11.9*   < > 11.2* 11.6* 11.2* 11.6* 14.2  HCT 52.8*   < > 35.0*   < > 33.0* 34.0* 33.0* 34.0* 42.5  MCV 93.8  --   --   --   --   --   --   --  94.7  PLT 220  --  134*  --   --   --   --   --  170   < > = values in this interval not displayed.    Basic Metabolic Panel: Recent Labs  Lab 12/16/21 1536 12/20/21 0757 12/20/21 0916 12/20/21 0940 12/20/21 1014 12/20/21 1039 12/20/21 1106 12/20/21 1109 12/20/21 1133 12/20/21 1136  NA 137 141 140   < > 136 138 138 138  138 137  K 4.3 4.4 4.3   < > 5.0 5.4* 5.0 5.0 4.5 4.5  CL 109 105 107  --  104  --   --  105  --  106  CO2 19*  --   --   --   --   --   --   --   --   --   GLUCOSE 123* 172* 164*  --  128*  --   --  137*  --  184*  BUN 24* 20 20  --  20  --   --  20  --  19  CREATININE 1.43* 1.20 1.20  --  1.10  --   --  1.10  --  1.10  CALCIUM 9.4  --   --   --   --   --   --   --   --   --    < > = values in this interval not displayed.   GFR: Estimated Creatinine Clearance: 70.2 mL/min (by C-G formula based on SCr of 1.1 mg/dL). Recent Labs  Lab 12/16/21 1536 12/20/21 1248  WBC 10.7* 22.6*    Liver Function Tests: Recent Labs  Lab 12/16/21 1536  AST 27  ALT 24  ALKPHOS 42  BILITOT 0.5  PROT 6.7  ALBUMIN 4.1   No results for input(s): LIPASE,  AMYLASE in the last 168 hours. No results for input(s): AMMONIA in the last 168 hours.  ABG    Component Value Date/Time   PHART 7.378 12/20/2021 1133   PCO2ART 36.2 12/20/2021 1133   PO2ART 82 (L) 12/20/2021 1133   HCO3 21.3 12/20/2021 1133   TCO2 22 12/20/2021 1136   ACIDBASEDEF 3.0 (H) 12/20/2021 1133   O2SAT 96.0 12/20/2021 1133     Coagulation Profile: Recent Labs  Lab 12/16/21 1536 12/20/21 1248  INR 0.9 1.3*    Cardiac Enzymes: No results for input(s): CKTOTAL, CKMB, CKMBINDEX, TROPONINI in the last 168 hours.  HbA1C: Hgb A1c MFr Bld  Date/Time Value Ref Range Status  12/16/2021 03:37 PM 6.3 (H) 4.8 - 5.6 % Final    Comment:    (NOTE) Pre diabetes:          5.7%-6.4%  Diabetes:              >6.4%  Glycemic control for   <7.0% adults with diabetes   06/13/2019 02:35 PM 7.7 (H) 4.8 - 5.6 % Final    Comment:    (NOTE) Pre diabetes:          5.7%-6.4% Diabetes:              >6.4% Glycemic control for   <7.0% adults with diabetes     CBG: Recent Labs  Lab 12/16/21 1454 12/20/21 0557 12/20/21 1251 12/20/21 1327  GLUCAP 128* 165* 138* 96    Review of Systems:   Intubated/sedated.  Past Medical History:  He,  has a past medical history of Arthritis, Colon polyp, Coronary artery disease, CORONARY ATHEROSCLEROSIS NATIVE CORONARY ARTERY (12/21/2009), Diabetes mellitus, Diverticulosis, History of kidney stones, Hyperlipidemia, and Hypertension.   Surgical History:   Past Surgical History:  Procedure Laterality Date   CORONARY ANGIOPLASTY WITH STENT PLACEMENT     left circumflex artery   LEFT HEART CATH AND CORONARY ANGIOGRAPHY N/A 12/02/2021   Procedure: LEFT HEART CATH AND CORONARY ANGIOGRAPHY;  Surgeon: Early Osmond, MD;  Location: [place] [facility];  Service: Cardiovascular;  Laterality: N/A;     Social  History:   reports that he quit smoking about 36 years ago. His smoking use included cigarettes. He has never used smokeless tobacco. He  reports current alcohol use. He reports that he does not use drugs.   Family History:  His family history includes Cancer in his father; Colon cancer in his mother. There is no history of Colon polyps, Kidney disease, Diabetes, or Esophageal cancer.   Allergies No Known Allergies   Home Medications  Prior to Admission medications   Medication Sig Start Date End Date Taking? Authorizing Provider  aspirin 81 MG tablet Take 81 mg by mouth daily.   Yes [provider]  b complex vitamins capsule Take 1 capsule by mouth daily.   Yes [provider]  fenofibrate micronized (LOFIBRA) 134 MG capsule Take 134 mg by mouth daily before breakfast.  06/02/19  Yes [provider]  insulin aspart (NOVOLOG) 100 UNIT/ML injection Inject into the skin daily. Per pump basal rate 3.6 units/hour  bolus 5-15 units per meal   Yes [provider]  JARDIANCE 25 MG TABS tablet Take 25 mg by mouth daily.  07/10/18  Yes [provider]  metFORMIN (GLUCOPHAGE) 850 MG tablet Take 850 mg by mouth daily with breakfast.    Yes [provider]  multivitamin (ONE-A-DAY MEN'S) TABS tablet Take 1 tablet by mouth daily.   Yes [provider]  nitroGLYCERIN (NITROSTAT) 0.4 MG SL tablet Place 1 tablet (0.4 mg total) under the tongue every 5 (five) minutes as needed for chest pain. 10/14/21  Yes Nahser, Wonda Cheng, MD  rosuvastatin (CRESTOR) 40 MG tablet Take 40 mg by mouth daily.   Yes [provider]  sodium chloride (OCEAN) 0.65 % SOLN nasal spray Place 1 spray into both nostrils as needed for congestion.   Yes [provider]  tobramycin (TOBREX) 0.3 % ophthalmic solution Place 1 drop into both eyes See admin instructions. Instill 1 drop into both eyes daily for 2 days before and 2 days after monthly eye injections 09/30/21  Yes [provider]  Insulin Human (INSULIN PUMP) SOLN Inject into the skin continuous. Using Novolog    [provider]     Critical care time: 32 minutes

## 2021-12-20 NOTE — Transfer of Care (Signed)
Immediate Anesthesia Transfer of Care Note  Patient: Jason Giles  Procedure(s) Performed: CORONARY ARTERY BYPASS GRAFTING (CABG) TIMES THREE, USING LEFT INTERNAL MAMMARY ARTERY AND ENDOSCOPICALLY HARVESTED RIGHT GREATER SAPHENOUS VEIN (Chest) TRANSESOPHAGEAL ECHOCARDIOGRAM (TEE) ENDOVEIN HARVEST OF GREATER SAPHENOUS VEIN (Right: Leg Lower)  Patient Location: SICU  Anesthesia Type:General  Level of Consciousness: sedated and Patient remains intubated per anesthesia plan  Airway & Oxygen Therapy: Patient remains intubated per anesthesia plan and Patient placed on Ventilator (see vital sign flow sheet for setting)  Post-op Assessment: Report given to RN and Post -op Vital signs reviewed and stable  Post vital signs: Reviewed and stable  Last Vitals:  Vitals Value Taken Time  BP    Temp    Pulse 82 12/20/21 1233  Resp 13 12/20/21 1233  SpO2 98 % 12/20/21 1233  Vitals shown include unvalidated device data.  Last Pain:  Vitals:   12/20/21 0604  TempSrc:   PainSc: 0-No pain         Complications: No notable events documented.

## 2021-12-20 NOTE — Interval H&P Note (Signed)
History and Physical Interval Note:  12/20/2021 7:25 AM  Jason Giles  has presented today for surgery, with the diagnosis of CAD.  The various methods of treatment have been discussed with the patient and family. After consideration of risks, benefits and other options for treatment, the patient has consented to  Procedure(s): CORONARY ARTERY BYPASS GRAFTING (CABG) (N/A) TRANSESOPHAGEAL ECHOCARDIOGRAM (TEE) (N/A) as a surgical intervention.  The patient's history has been reviewed, patient examined, no change in status, stable for surgery.  I have reviewed the patient's chart and labs.  Questions were answered to the patient's satisfaction.     [name]

## 2021-12-20 NOTE — Discharge Summary (Incomplete)
Physician Discharge Summary       [address]       [place] [address]             [phone]    Patient ID: Jason Giles MRN: [id number] DOB/AGE: 04-21-1946 76 y.o.  Admit date: 12/20/2021 Discharge date: 12/20/2021  Admission Diagnoses: Coronary artery disease Discharge Diagnoses:  1.  S/P CABG x 3 2. Expected post op blood loss anemia 3. History of diabetes mellitus 4. History of hyperlipidemia 5. History of hypertension 6. History of kidney stones 7. History of colon polyp 8. History of arthritis 9. History of diverticuolosis   Consults: {consultation:[id number]}  Procedure (s): ***  HPI: This is a 76 year old male with a past medical history of CAD (DES stent 2012), diabetes mellitus, and essential hypertension who was seen by Dr. Kipp Brood in the office for surgical evaluation of three vessel coronary artery disease. This was discovered after he was evaluated after an episode of chest pain while laying in bed.  Since that time he has had no other episodes.  His left heart cath did show left main disease as well as in-stent restenosis in his circumflex stent. He denies any shortness of breath or orthopnea. Dr. Kipp Brood discussed the need for coronary artery disease. Potential risks, benefits, and complications of the surgery were discussed with the patient and he agreed to proceed with surgery. Pre operative carotid duplex US showed no significant internal carotid artery stenosis bilaterally.  Hospital Course: Patient underwent a CABG x . He was transferred from the OR to [facility] ICU in stable condition.    Latest Vital Signs: Blood pressure (!) 153/71, pulse 77, temperature 98.5 F (36.9 C), temperature source Oral, resp. rate 12, height 5\' 11"  (1.803 m), weight 104.3 kg, SpO2 98 %.  Physical Exam:***  Discharge Condition:***  Recent laboratory studies:  Lab Results  Component Value Date   WBC 10.7 (H) 12/16/2021   HGB 11.6 (L) 12/20/2021   HCT  34.0 (L) 12/20/2021   MCV 93.8 12/16/2021   PLT 134 (L) 12/20/2021   Lab Results  Component Value Date   NA 137 12/20/2021   K 4.5 12/20/2021   CL 106 12/20/2021   CO2 19 (L) 12/16/2021   CREATININE 1.10 12/20/2021   GLUCOSE 184 (H) 12/20/2021      Diagnostic Studies: DG Chest 2 View  Result Date: 12/18/2021 CLINICAL DATA:  Preoperative for heart surgery. EXAM: CHEST - 2 VIEW COMPARISON:  06/13/2019. FINDINGS: The heart size and mediastinal contours are within normal limits. Atherosclerotic calcification of the aorta is noted. No consolidation, effusion, or pneumothorax. Mild degenerative changes in the thoracic spine. IMPRESSION: No active cardiopulmonary disease. Electronically Signed   By: Brett Fairy M.D.   On: 12/18/2021 04:06   CARDIAC CATHETERIZATION  Result Date: 12/02/2021   Prox Cx lesion is 100% stenosed.   Prox LAD lesion is 50% stenosed.   Mid LM lesion is 50% stenosed.   Dist LAD lesion is 30% stenosed.   Mid RCA lesion is 80% stenosed.   LV end diastolic pressure is normal.   The left ventricular ejection fraction is 55-65% by visual estimate. 1.  Multivessel disease consisting of left main, LAD, collateralized chronically occluded left circumflex, and high-grade right coronary disease. 2.  Preserved ejection fraction with LVEDP of 15 mmHg. The patient will be referred for outpatient cardiothoracic surgical evaluation.   CT CORONARY MORPH W/CTA COR W/SCORE W/CA W/CM &/OR WO/CM  Addendum Date: 11/23/2021  ADDENDUM REPORT: 11/23/2021 18:14 CLINICAL DATA:  76 Year old White Male EXAM: Cardiac/Coronary  CTA TECHNIQUE: The patient was scanned on a [employer]. FINDINGS: Scan was triggered in the descending thoracic aorta. Axial non-contrast 3 mm slices were carried out through the heart. The data set was analyzed on a dedicated work station and scored using the Adams. Gantry rotation speed was 250 msecs and collimation was .6 mm. 0.8 mg of sl NTG was given.  The 3D data set was reconstructed in 5% intervals of the 67-82 % of the R-R cycle. Diastolic phases were analyzed on a dedicated work station using MPR, MIP and VRT modes. The patient received 95 cc of contrast. Aorta: Mild dilated thoracic aorta. Aortic atherosclerosis. No dissection. Main Pulmonary Artery: Normal size of the pulmonary artery. Aortic Valve:  Tri-leaflet.  No calcifications. Coronary Arteries:  Normal coronary origin.  Right dominance. Coronary Calcium Score: Left main: 650 Left anterior descending artery: 1141 Left circumflex artery: 783 Right coronary artery: 1572 Total: 4147 Percentile: 96th for age, sex, and race matched control. RCA is a large dominant artery that gives rise to PDA and PLA. There are multiple moderate RCA mixed plaque stenoses throughout the proximal and mid RCA. Left main is a large artery that gives rise to LAD and LCX arteries. There is approaching 50% stenosis calcified plaque in the mid-distal left main. LAD is a large vessel that gives rise to two diagonal vessels. There is severe calcified stenosis in the proximal and mid LAD. Moderate calcified stenosis distal LAD and in the proximal D2. Mild calcified plaque in the D1 (small vessel). LCX is a non-dominant artery. Moderate calcified stenosis proximal vessel. There is severe soft plaque stenosis in the mid vessel. Other findings: Normal pulmonary vein drainage into the left atrium. Normal left atrial appendage without a thrombus. Extra-cardiac findings: See attached radiology report for non-cardiac structures. IMPRESSION: 1. Coronary calcium score of 4147. This was 96th percentile for age, sex, and race matched control. 2. Normal coronary origin with right dominance. 3. CAD-RADS 4 Severe stenosis. (70-99% or > 50% left main). Cardiac catheterization or CT FFR is recommended. Consider symptom-guided anti-ischemic pharmacotherapy as well as risk factor modification per guideline directed care. 4. Mild dilated thoracic  aorta. RECOMMENDATIONS: Coronary artery calcium (CAC) score is a strong predictor of incident coronary heart disease (CHD) and provides predictive information beyond traditional risk factors. CAC scoring is reasonable to use in the decision to withhold, postpone, or initiate statin therapy in intermediate-risk or selected borderline-risk asymptomatic adults (age 29-75 years and LDL-C >=70 to <190 mg/dL) who do not have diabetes or established atherosclerotic cardiovascular disease (ASCVD).* In intermediate-risk (10-year ASCVD risk >=7.5% to <20%) adults or selected borderline-risk (10-year ASCVD risk >=5% to <7.5%) adults in whom a CAC score is measured for the purpose of making a treatment decision the following recommendations have been made: If CAC = 0, it is reasonable to withhold statin therapy and reassess in 5 to 10 years, as long as higher risk conditions are absent (diabetes mellitus, family history of premature CHD in first degree relatives (males <55 years; females <65 years), cigarette smoking, LDL >=190 mg/dL or other independent risk factors). If CAC is 1 to 99, it is reasonable to initiate statin therapy for patients >=39 years of age. If CAC is >=100 or >=75th percentile, it is reasonable to initiate statin therapy at any age. Cardiology referral should be considered for patients with CAC scores =400 or >=75th percentile. *2018 AHA/ACC/AACVPR/AAPA/ABC/ACPM/ADA/AGS/APhA/ASPC/NLA/PCNA Guideline  on the Management of Blood Cholesterol: A Report of the [employer] of Cardiology/American Heart Association Task Force on Clinical Practice Guidelines. J Am Coll Cardiol. 2019;73(24):3168-3209. Rudean Haskell, MD Electronically Signed   By: Rudean Haskell M.D.   On: 11/23/2021 18:14   Result Date: 11/23/2021 EXAM: OVER-READ INTERPRETATION  CT CHEST The following report is an over-read performed by radiologist Dr. Salvatore Marvel of [facility], [place] on 11/23/2021. This over-read does not  include interpretation of cardiac or coronary anatomy or pathology. The coronary CTA interpretation by the cardiologist is attached. COMPARISON:  None. FINDINGS: Cardiovascular: Normal heart size. No significant pericardial effusion/thickening. Atherosclerotic thoracic aorta with dilated 4.3 cm ascending thoracic aorta. Normal caliber pulmonary arteries. No central pulmonary emboli. Mediastinum/Nodes: Unremarkable esophagus. No pathologically enlarged mediastinal or hilar lymph nodes. Lungs/Pleura: No pneumothorax. No pleural effusion. No acute consolidative airspace disease or lung masses. Two tiny peripheral right middle lobe solid pulmonary nodules, largest 3 mm (series 11/image 8). Upper abdomen: No acute abnormality. Musculoskeletal: No aggressive appearing focal osseous lesions. Mild thoracic spondylosis. IMPRESSION: 1. 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 Recommend annual imaging followup by CTA or MRA. This recommendation follows 2010 ACCF/AHA/AATS/ACR/ASA/SCA/SCAI/SIR/STS/SVM Guidelines for the Diagnosis and Management of Patients with Thoracic Aortic Disease. Circulation. 2010; 121: M578-I696. Aortic aneurysm NOS (ICD10-I71.9). 2. Two tiny peripheral right middle lobe pulmonary nodules, largest 3 mm. No follow-up needed if patient is low-risk (and has no known or suspected primary neoplasm). Non-contrast chest CT can be considered in 12 months if patient is high-risk. This recommendation follows the consensus statement: Guidelines for Management of Incidental Pulmonary Nodules Detected on CT Images: From the Fleischner Society 2017; Radiology 2017; 284:228-243. 3. Aortic Atherosclerosis (ICD10-I70.0). Electronically Signed: By: Ilona Sorrel M.D. On: 11/23/2021 17:16   CT CORONARY FRACTIONAL FLOW RESERVE FLUID ANALYSIS  Result Date: 11/24/2021 EXAM: CT-FFR ANALYSIS CLINICAL DATA:  Possibly obstructive coronary lesion (multi-vessel) FINDINGS: CT-FFR analysis was performed on the original cardiac CT  angiogram dataset. Diagrammatic representation of the CT-FFR analysis is provided in a separate PDF document in PACS. This dictation was created using the PDF document and an interactive 3D model of the results. 3D model is not available in the EMR/PACS. Normal FFR range is >0.80. 1. Left Main: No significant functional stenosis, CT-FFR 0.98. 2. LAD: significant functional stenosis, CT-FFR 0.97 and 0.92 in proximal and mid lesions, but 0.83 to 0.68 at distal lesion 3. LCX: Modeled as CTO in the mid vessel. 4. RCA: significant functional stenosis, CT-FFR 0.85 to 0.79 in the mid vessel. IMPRESSION: 1. CT FFR analysis shows evidence of significant functional stenosis. Rudean Haskell MD Electronically Signed   By: Rudean Haskell M.D.   On: 11/24/2021 08:05   ECHOCARDIOGRAM COMPLETE  Result Date: 12/08/2021    ECHOCARDIOGRAM REPORT   Patient Name:   Jason Giles Date of Exam: 12/08/2021 Medical Rec #:  [id number]       Height:       71.5 in Accession #:    [id number]      Weight:       230.0 lb Date of Birth:  [birth year]       BSA:          2.249 m Patient Age:    76 years        BP:           138/70 mmHg Patient Gender: M               HR:  72 bpm. Exam Location:  [place] Procedure: 2D Echo, Cardiac Doppler and Color Doppler Indications:    I25.10 Coronary artery disease - Native vessel; Z01.818 Pre-op                 testing  History:        Patient has prior history of Echocardiogram examinations, most                 recent 06/14/2019. Signs/Symptoms:Syncope; Risk                 Factors:Hypertension, Diabetes and Dyslipidemia. Orthostatic                 hypotension.  Sonographer:    Diamond Nickel RCS Referring Phys: [id number] [place]  1. Left ventricular ejection fraction, by estimation, is 60 to 65%. The left ventricle has normal function. The left ventricle has no regional wall motion abnormalities. There is moderate-to-severe hypertrophy of the basal septum.  The rest of the LV segments demonstrate mild left ventricular hypertrophy. Left ventricular diastolic parameters are consistent with Grade I diastolic dysfunction (impaired relaxation).  2. Right ventricular systolic function is normal. The right ventricular size is normal. Tricuspid regurgitation signal is inadequate for assessing PA pressure.  3. The mitral valve is normal in structure. Mild mitral valve regurgitation.  4. The aortic valve is tricuspid. There is mild calcification of the aortic valve. There is mild thickening of the aortic valve. Aortic valve regurgitation is not visualized. Aortic valve sclerosis/calcification is present, without any evidence of aortic stenosis.  5. Aortic dilatation noted. There is borderline dilatation of the ascending aorta, measuring 37 mm. Comparison(s): Compared to prior TTE in 06/2019, there is no significant change. FINDINGS  Left Ventricle: Left ventricular ejection fraction, by estimation, is 60 to 65%. The left ventricle has normal function. The left ventricle has no regional wall motion abnormalities. The left ventricular internal cavity size was normal in size. There is  moderate-to-severe hypertrophy of the basal septum. The rest of the LV segments demonstrate mild left ventricular hypertrophy. Left ventricular diastolic parameters are consistent with Grade I diastolic dysfunction (impaired relaxation). Right Ventricle: The right ventricular size is normal. No increase in right ventricular wall thickness. Right ventricular systolic function is normal. Tricuspid regurgitation signal is inadequate for assessing PA pressure. Left Atrium: Left atrial size was normal in size. Right Atrium: Right atrial size was normal in size. Pericardium: There is no evidence of pericardial effusion. Mitral Valve: The mitral valve is normal in structure. There is mild thickening of the mitral valve leaflet(s). There is mild calcification of the mitral valve leaflet(s). Mild mitral  annular calcification. Mild mitral valve regurgitation. Tricuspid Valve: The tricuspid valve is normal in structure. Tricuspid valve regurgitation is trivial. Aortic Valve: The aortic valve is tricuspid. There is mild calcification of the aortic valve. There is mild thickening of the aortic valve. Aortic valve regurgitation is not visualized. Aortic valve sclerosis/calcification is present, without any evidence of aortic stenosis. Pulmonic Valve: The pulmonic valve was normal in structure. Pulmonic valve regurgitation is trivial. Aorta: Aortic dilatation noted. There is borderline dilatation of the ascending aorta, measuring 37 mm. Venous: The inferior vena cava was not well visualized. IAS/Shunts: The atrial septum is grossly normal.  LEFT VENTRICLE PLAX 2D LVIDd:         3.30 cm   Diastology LVIDs:         2.10 cm   LV e' medial:  7.51 cm/s LV PW:         1.70 cm   LV E/e' medial:  12.7 LV IVS:        1.70 cm   LV e' lateral:   10.10 cm/s LVOT diam:     2.10 cm   LV E/e' lateral: 9.4 LV SV:         49 LV SV Index:   22 LVOT Area:     3.46 cm  RIGHT VENTRICLE RV Basal diam:  2.60 cm RV S prime:     14.90 cm/s TAPSE (M-mode): 2.6 cm LEFT ATRIUM             Index        RIGHT ATRIUM           Index LA diam:        4.50 cm 2.00 cm/m   RA Area:     11.60 cm LA Vol (A2C):   69.0 ml 30.68 ml/m  RA Volume:   25.00 ml  11.11 ml/m LA Vol (A4C):   47.2 ml 20.98 ml/m LA Biplane Vol: 58.8 ml 26.14 ml/m  AORTIC VALVE LVOT Vmax:   67.80 cm/s LVOT Vmean:  49.400 cm/s LVOT VTI:    0.142 m  AORTA Ao Root diam: 3.50 cm Ao Asc diam:  3.70 cm MITRAL VALVE MV Area (PHT): 3.37 cm     SHUNTS MV Decel Time: 225 msec     Systemic VTI:  0.14 m MV E velocity: 95.40 cm/s   Systemic Diam: 2.10 cm MV A velocity: 100.00 cm/s MV E/A ratio:  0.95 Gwyndolyn Kaufman MD Electronically signed by Gwyndolyn Kaufman MD Signature Date/Time: 12/08/2021/3:45:22 PM    Final    VAS US DOPPLER PRE CABG  Result Date: 12/17/2021 PREOPERATIVE VASCULAR  EVALUATION Patient Name:  Jason Giles  Date of Exam:   12/16/2021 Medical Rec #: [id number]        Accession #:    [id number] Date of Birth: 04/07/1946        Patient Gender: M Patient Age:   31 years Exam Location:  [facility] Procedure:      VAS US DOPPLER PRE CABG Referring Phys: HARRELL LIGHTFOOT --------------------------------------------------------------------------------  Indications:      Pre-CABG. Risk Factors:     Hypertension, hyperlipidemia, Diabetes, coronary artery                   disease. Limitations:      High bifurcation of the carotid arteries, acoustic shadowing                   due to calcification Comparison Study: No prior studies. Performing Technologist: Darlin Coco RDMS RVT  Examination Guidelines: A complete evaluation includes B-mode imaging, spectral Doppler, color Doppler, and power Doppler as needed of all accessible portions of each vessel. Bilateral testing is considered an integral part of a complete examination. Limited examinations for reoccurring indications may be performed as noted.  Right Carotid Findings: +----------+--------+--------+--------+-------------------------+--------+             PSV cm/s EDV cm/s Stenosis Describe                  Comments  +----------+--------+--------+--------+-------------------------+--------+  CCA Prox   87       7                                                     +----------+--------+--------+--------+-------------------------+--------+  CCA Mid    86       11                                                    +----------+--------+--------+--------+-------------------------+--------+  CCA Distal 63       7                                                     +----------+--------+--------+--------+-------------------------+--------+  ICA Prox   58       8        1-39%    calcific and heterogenous           +----------+--------+--------+--------+-------------------------+--------+  ICA Distal 24       6                                                      +----------+--------+--------+--------+-------------------------+--------+  ECA        86       9                                                     +----------+--------+--------+--------+-------------------------+--------+ +----------+--------+-------+----------------+------------+             PSV cm/s EDV cms Describe         Arm Pressure  +----------+--------+-------+----------------+------------+  Subclavian 66               Multiphasic, WNL               +----------+--------+-------+----------------+------------+ +---------+--------+--+--------+-+---------+  Vertebral PSV cm/s 45 EDV cm/s 6 Antegrade  +---------+--------+--+--------+-+---------+ Left Carotid Findings: +----------+--------+--------+--------+-------------------------+--------+             PSV cm/s EDV cm/s Stenosis Describe                  Comments  +----------+--------+--------+--------+-------------------------+--------+  CCA Prox   100      11                                                    +----------+--------+--------+--------+-------------------------+--------+  CCA Distal 74       8                                                     +----------+--------+--------+--------+-------------------------+--------+  ICA Prox   75       15       1-39%    calcific and heterogenous           +----------+--------+--------+--------+-------------------------+--------+  ICA Distal 64       14                                                    +----------+--------+--------+--------+-------------------------+--------+  ECA        88       8                                                     +----------+--------+--------+--------+-------------------------+--------+ +----------+--------+--------+----------------+------------+  Subclavian PSV cm/s EDV cm/s Describe         Arm Pressure  +----------+--------+--------+----------------+------------+             149               Multiphasic, WNL                +----------+--------+--------+----------------+------------+ +---------+--------+--+--------+-+---------+  Vertebral PSV cm/s 28 EDV cm/s 8 Antegrade  +---------+--------+--+--------+-+---------+  ABI Findings: +---------+------------------+-----+---------+----------------+  Right     Rt Pressure (mmHg) Index Waveform  Comment           +---------+------------------+-----+---------+----------------+  Brachial  148                      triphasic                   +---------+------------------+-----+---------+----------------+  PTA       255                1.70  triphasic Non-compressible  +---------+------------------+-----+---------+----------------+  DP        179                1.19  triphasic                   +---------+------------------+-----+---------+----------------+  Great Toe 121                0.81  Normal                      +---------+------------------+-----+---------+----------------+ +---------+------------------+-----+---------+----------------+  Left      Lt Pressure (mmHg) Index Waveform  Comment           +---------+------------------+-----+---------+----------------+  Brachial  150                      triphasic                   +---------+------------------+-----+---------+----------------+  PTA       255                1.70  triphasic Non-compressible  +---------+------------------+-----+---------+----------------+  DP        255                1.70  triphasic Non-compressible  +---------+------------------+-----+---------+----------------+  Great Toe 115                0.77  Normal                      +---------+------------------+-----+---------+----------------+ Arterial wall calcification precludes accurate ankle pressures and ABIs.  Right Doppler Findings: +--------+--------+-----+---------+--------+  Site     Pressure Index Doppler   Comments  +--------+--------+-----+---------+--------+  Brachial 148            triphasic           +--------+--------+-----+---------+--------+  Radial                   triphasic           +--------+--------+-----+---------+--------+  Ulnar                   triphasic           +--------+--------+-----+---------+--------+  Left Doppler Findings: +--------+--------+-----+---------+--------+  Site     Pressure Index Doppler   Comments  +--------+--------+-----+---------+--------+  Brachial 150            triphasic           +--------+--------+-----+---------+--------+  Radial                  triphasic           +--------+--------+-----+---------+--------+  Ulnar                   triphasic           +--------+--------+-----+---------+--------+  Summary: Right Carotid: Velocities in the right ICA are consistent with a 1-39% stenosis. Left Carotid: Velocities in the left ICA are consistent with a 1-39% stenosis. Vertebrals:  Bilateral vertebral arteries demonstrate antegrade flow. Subclavians: Normal flow hemodynamics were seen in bilateral subclavian              arteries. Right ABI: Resting right ankle-brachial index indicates noncompressible right lower extremity arteries. The right toe-brachial index is normal. Left ABI: Resting left ankle-brachial index indicates noncompressible left lower extremity arteries. The left toe-brachial index is normal. Right Upper Extremity: Doppler waveforms remain within normal limits with right radial compression. Doppler waveforms remain within normal limits with right ulnar compression. Left Upper Extremity: Doppler waveforms remain within normal limits with left radial compression. Doppler waveforms remain within normal limits with left ulnar compression.  Electronically signed by Harold Barban MD on 12/17/2021 at 5:32:16 PM.    Final      Discharge Medications: Allergies as of 12/20/2021   No Known Allergies   Med Rec must be completed prior to using this [facility]***      The patient has been discharged on:   1.Beta Blocker:  Yes [   ]                              No   [   ]                              If No,  reason:  2.Ace Inhibitor/ARB: Yes [   ]                                     No  [    ]                                     If No, reason:  3.Statin:   Yes [   ]                  No  [   ]                  If No, reason:  4.Ecasa:  Yes  [   ]                  No   [   ]  If No, reason:  Patient had ACS upon admission:  Plavix/P2Y12 inhibitor: Yes [   ]                                      No  [   ]   Follow Up Appointments:  Follow-up Information     Nahser, Wonda Cheng, MD Follow up.   Specialty: Cardiology Contact information: [place] [id number] [phone]         Lajuana Matte, MD Follow up.   Specialty: Cardiothoracic Surgery Why: Appointment is via telephone;please do NOT go to the office. Dr. Kipp Brood will call you at 2:40 pm Contact information: [address] [place] [address] [phone]                 Signed: Sharalyn Ink [facility] 12/20/2021, 11:53 AM

## 2021-12-20 NOTE — Anesthesia Procedure Notes (Signed)
Procedure Name: Intubation Date/Time: 12/20/2021 7:49 AM Performed by: Kyung Rudd, CRNA Pre-anesthesia Checklist: Patient identified, Emergency Drugs available, Suction available and Patient being monitored Patient Re-evaluated:Patient Re-evaluated prior to induction Oxygen Delivery Method: Circle System Utilized Preoxygenation: Pre-oxygenation with 100% oxygen Induction Type: IV induction Ventilation: Mask ventilation without difficulty Laryngoscope Size: Mac and 4 Grade View: Grade II Tube type: Oral Tube size: 8.0 mm Number of attempts: 1 Airway Equipment and Method: Stylet and Oral airway Placement Confirmation: ETT inserted through vocal cords under direct vision, positive ETCO2 and breath sounds checked- equal and bilateral Secured at: 22 cm Tube secured with: Tape Dental Injury: Teeth and Oropharynx as per pre-operative assessment  Comments: ETT placed by Lucita Ferrara, SRNA under supervision of MDA and CRNA

## 2021-12-20 NOTE — Progress Notes (Addendum)
° °   °  [address]       [place] [address]             [phone]      S/p CABG x 3  Intubated, sedated  BP (!) 83/58    Pulse 74    Temp 99.9 F (37.7 C)    Resp 17    Ht 5\' 11"  (1.803 m)    Wt 104.3 kg    SpO2 98%    BMI 32.08 kg/m  CI= 2.3, CVP = 13 In SR on amiodarone  Intake/Output Summary (Last 24 hours) at 12/20/2021 1750 Last data filed at 12/20/2021 1700 Gross per 24 hour  Intake 3640.47 ml  Output 3000 ml  Net 640.47 ml   Minimal CT output Hct 42, CBG well controlled  Doing well early postop  Remo Lipps C. Roxan Hockey, MD [facility] [phone]

## 2021-12-20 NOTE — Anesthesia Procedure Notes (Signed)
Central Venous Catheter Insertion Performed by: Oleta Mouse, MD, anesthesiologist Start/End2/05/2022 7:11 AM, 12/20/2021 7:19 AM Patient location: Pre-op. Preanesthetic checklist: patient identified, IV checked, risks and benefits discussed, surgical consent, monitors and equipment checked, pre-op evaluation, timeout performed and anesthesia consent Position: supine Lidocaine 1% used for infiltration and patient sedated Hand hygiene performed  and maximum sterile barriers used  Catheter size: 9 Fr Total catheter length 10. MAC introducer Procedure performed using ultrasound guided technique. Ultrasound Notes:anatomy identified, needle tip was noted to be adjacent to the nerve/plexus identified, no ultrasound evidence of intravascular and/or intraneural injection and image(s) printed for medical record Attempts: 1 Following insertion, line sutured, dressing applied and Biopatch. Post procedure assessment: blood return through all ports, free fluid flow and no air  Patient tolerated the procedure well with no immediate complications.

## 2021-12-20 NOTE — Op Note (Signed)
[address]       [place] [address]             [phone]                                          12/20/2021 Patient:  Jason Giles Pre-Op Dx: Three-vessel coronary artery disease   Hypertension   Hyperlipidemia   Diabetes mellitus Post-op Dx: Same Procedure: CABG X 3.  LIMA to LAD.  Reverse saphenous vein graft obtuse marginal, reverse saphenous vein graft to the posterior lateral branch. Endoscopic greater saphenous vein harvest on the right   Surgeon and Role:      * Chesney Suares, Lucile Crater, MD - Primary    *D. Ezekiel Slocumb- assisting An experienced assistant was required given the complexity of this surgery and the standard of surgical care. The assistant was needed for exposure, dissection, suctioning, retraction of delicate tissues and sutures, instrument exchange and for overall help during this procedure.    Anesthesia  general EBL: 500 ml Blood Administration: None Xclamp Time: 48 min Pump Time: 101 min  Drains: 19 F blake drain: L, mediastinal  Wires: Ventricular Counts: correct   Indications: 76 year old male with history of coronary artery disease with PCI to the circumflex and now has three-vessel coronary disease.  I personally reviewed his left heart catheterization.  He does have a good target on the LAD and PDA.  The circumflex fills from left to left collaterals.  There does appear to be a good target on the lateral wall.  His echocardiogram does not show any significant valvular disease and he has preserved biventricular function.  We discussed the risks and benefits of a three-vessel CABG.  He is agreeable to proceed.  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  Findings: Good LIMA, good vein conduit.  Heavily calcified LAD.  The PDA was a smaller target and was also heavily calcified.  The posterior lateral branch was a better target on the  posterior wall.  The circumflex was intramyocardial.  Operative Technique: All invasive lines were placed in pre-op holding.  After the risks, benefits and alternatives were thoroughly discussed, the patient was brought to the operative theatre.  Anesthesia was induced, and the patient was prepped and draped in normal sterile fashion.  An appropriate surgical pause was performed, and pre-operative antibiotics were dosed accordingly.  We began with simultaneous incisions along the right leg for harvesting of the greater saphenous vein and the chest for the sternotomy.  In regards to the sternotomy, this was carried down with bovie cautery, and the sternum was divided with a reciprocating saw.  Meticulous hemostasis was obtained.  The left internal thoracic artery was exposed and harvested in in pedicled fashion.  The patient was systemically heparinized, and the artery was divided distally, and placed in a papaverine sponge.    The sternal elevator was removed, and a retractor was placed.  The pericardium was divided in the midline and fashioned into a cradle with pericardial stitches.   After we confirmed an appropriate ACT, the ascending aorta was cannulated in standard fashion.  The right atrial appendage was used for venous cannulation site.  Cardiopulmonary bypass was initiated, and the heart retractor was placed. The cross clamp was applied, and  a dose of anterograde cardioplegia was given with good arrest of the heart.  We moved to the posterior wall of the heart, and found a good target on the posterior lateral branch.  An arteriotomy was made, and the vein graft was anastomosed to it in an end to side fashion.  Next we exposed the lateral wall, and found a good target on the obtuse marginal.  An end to side anastomosis with the vein graft was then created.   Finally, we exposed a good target on the LAD, and fashioned an end to side anastomosis between it and the LITA.  We began to re-warm, and a  re-animation dose of cardioplegia was given.  The heart was de-aired, and the cross clamp was removed.  Meticulous hemostasis was obtained.    A partial occludding clamp was then placed on the ascending aorta, and we created an end to side anastomosis between it and the proximal vein grafts.  Rings were placed on the proximal anastomosis.  Hemostasis was obtained, and we separated from cardiopulmonary bypass without event.  The heparin was reversed with protamine.  Chest tubes and wires were placed, and the sternum was re-approximated with sternal wires.  The soft tissue and skin were re-approximated wth absorbable suture.    The patient tolerated the procedure without any immediate complications, and was transferred to the ICU in guarded condition.  Melizza Kanode Bary Leriche

## 2021-12-20 NOTE — Progress Notes (Signed)
°  Transition of Care [facility]) Screening Note   Patient Details  Name: Jason Giles Date of Birth: [birth year]   Transition of Care [facility]) CM/SW Contact:    Milas Gain, [place] Phone Number: 12/20/2021, 3:54 PM    Transition of Care Department [facility]) has reviewed patient and no TOC needs have been identified at this time. We will continue to monitor patient advancement through interdisciplinary progression rounds. If new patient transition needs arise, please place a TOC consult.

## 2021-12-20 NOTE — Anesthesia Procedure Notes (Signed)
Central Venous Catheter Insertion Performed by: Oleta Mouse, MD Start/End2/05/2022 7:11 AM, 12/20/2021 7:19 AM Patient location: Pre-op. Preanesthetic checklist: patient identified, IV checked, risks and benefits discussed, surgical consent, monitors and equipment checked, pre-op evaluation, timeout performed and anesthesia consent Hand hygiene performed  and maximum sterile barriers used  Central line was placed.Triple lumen Procedure performed without using ultrasound guided technique. Attempts: 1 Following insertion, dressing applied. Post procedure assessment: blood return through all ports, free fluid flow and no air  Patient tolerated the procedure well with no immediate complications.

## 2021-12-20 NOTE — Procedures (Signed)
Extubation Procedure Note  Patient Details:   Name: JOURDYN FERRIN DOB: 1946-08-27 MRN: [id number]   Airway Documentation:    Vent end date: 12/20/21 Vent end time: 1832   Evaluation  O2 sats: stable throughout Complications: No apparent complications Patient did tolerate procedure well. Bilateral Breath Sounds: Diminished   Yes, pt could speak post extubation.  Pt extubated to 4 l/m [place] per protocol.  Earney Navy 12/20/2021, 6:32 PM

## 2021-12-20 NOTE — Anesthesia Postprocedure Evaluation (Signed)
Anesthesia Post Note  Patient: KAIDYN JAVID  Procedure(s) Performed: CORONARY ARTERY BYPASS GRAFTING (CABG) TIMES THREE, USING LEFT INTERNAL MAMMARY ARTERY AND ENDOSCOPICALLY HARVESTED RIGHT GREATER SAPHENOUS VEIN (Chest) TRANSESOPHAGEAL ECHOCARDIOGRAM (TEE) ENDOVEIN HARVEST OF GREATER SAPHENOUS VEIN (Right: Leg Lower)     Patient location during evaluation: SICU Anesthesia Type: General Level of consciousness: sedated Pain management: pain level controlled Vital Signs Assessment: post-procedure vital signs reviewed and stable Respiratory status: patient remains intubated per anesthesia plan Cardiovascular status: stable Postop Assessment: no apparent nausea or vomiting Anesthetic complications: no   No notable events documented.  Last Vitals:  Vitals:   12/20/21 1445 12/20/21 1500  BP: 91/65 (!) 87/65  Pulse: 80 81  Resp: 12 14  Temp: 36.8 C 36.9 C  SpO2: 100% 100%    Last Pain:  Vitals:   12/20/21 1300  TempSrc: Bladder  PainSc:                  Adilenne Ashworth

## 2021-12-20 NOTE — Progress Notes (Signed)
°  Echocardiogram Echocardiogram Transesophageal has been performed.  Jason Giles 12/20/2021, 8:52 AM

## 2021-12-20 NOTE — Anesthesia Preprocedure Evaluation (Signed)
Anesthesia Evaluation  Patient identified by MRN, date of birth, ID band Patient awake    Reviewed: Allergy & Precautions, NPO status , Patient's Chart, lab work & pertinent test results  History of Anesthesia Complications Negative for: history of anesthetic complications  Airway Mallampati: IV  TM Distance: >3 FB Neck ROM: Full    Dental  (+) Teeth Intact, Dental Advisory Given   Pulmonary neg shortness of breath, neg sleep apnea, neg COPD, neg recent URI, former smoker,    breath sounds clear to auscultation       Cardiovascular hypertension, (-) angina+ CAD   Rhythm:Regular  1. Left ventricular ejection fraction, by estimation, is 60 to 65%. The  left ventricle has normal function. The left ventricle has no regional  wall motion abnormalities. There is moderate-to-severe hypertrophy of the  basal septum. The rest of the LV  segments demonstrate mild left ventricular hypertrophy. Left ventricular  diastolic parameters are consistent with Grade I diastolic dysfunction  (impaired relaxation).  2. Right ventricular systolic function is normal. The right ventricular  size is normal. Tricuspid regurgitation signal is inadequate for assessing  PA pressure.  3. The mitral valve is normal in structure. Mild mitral valve  regurgitation.  4. The aortic valve is tricuspid. There is mild calcification of the  aortic valve. There is mild thickening of the aortic valve. Aortic valve  regurgitation is not visualized. Aortic valve sclerosis/calcification is  present, without any evidence of  aortic stenosis.  5. Aortic dilatation noted. 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    Prox Cx lesion is 100% stenosed.   Prox LAD lesion is 50% stenosed.   Mid LM lesion is 50% stenosed.   Dist LAD lesion is 30% stenosed.   Mid RCA lesion is 80% stenosed.   LV end diastolic pressure is normal.   The  left ventricular ejection fraction is 55-65% by visual estimate.  1.  Multivessel disease consisting of left main, LAD, collateralized chronically occluded left circumflex, and high-grade right coronary disease. 2.  Preserved ejection fraction with LVEDP of 15 mmHg.    Neuro/Psych negative neurological ROS  negative psych ROS   GI/Hepatic negative GI ROS, Neg liver ROS,   Endo/Other  diabetes, Type 2, Insulin Dependent  Renal/GU Lab Results      Component                Value               Date                      CREATININE               1.20                12/20/2021                Musculoskeletal  (+) Arthritis ,   Abdominal   Peds  Hematology negative hematology ROS (+) Lab Results      Component                Value               Date                      WBC                      10.7 (  H)            12/16/2021                HGB                      15.3                12/20/2021                HCT                      45.0                12/20/2021                MCV                      93.8                12/16/2021                PLT                      220                 12/16/2021              Anesthesia Other Findings   Reproductive/Obstetrics                             Anesthesia Physical Anesthesia Plan  ASA: 4  Anesthesia Plan: General   Post-op Pain Management:    Induction: Intravenous  PONV Risk Score and Plan: 2 and Treatment may vary due to age or medical condition  Airway Management Planned: Oral ETT  Additional Equipment: Arterial line, CVP, Ultrasound Guidance Line Placement and TEE  Intra-op Plan:   Post-operative Plan: Post-operative intubation/ventilation  Informed Consent: I have reviewed the patients History and Physical, chart, labs and discussed the procedure including the risks, benefits and alternatives for the proposed anesthesia with the patient or authorized representative who has indicated  his/her understanding and acceptance.     Dental advisory given  Plan Discussed with: CRNA and Anesthesiologist  Anesthesia Plan Comments:         Anesthesia Quick Evaluation

## 2021-12-21 ENCOUNTER — Encounter (HOSPITAL_COMMUNITY): Payer: Self-pay | Admitting: Thoracic Surgery (Cardiothoracic Vascular Surgery)

## 2021-12-21 ENCOUNTER — Inpatient Hospital Stay (HOSPITAL_COMMUNITY): Payer: Medicare Other

## 2021-12-21 DIAGNOSIS — Z951 Presence of aortocoronary bypass graft: Secondary | ICD-10-CM | POA: Diagnosis not present

## 2021-12-21 LAB — CBC
HCT: 41.5 % (ref 39.0–52.0)
HCT: 44 % (ref 39.0–52.0)
Hemoglobin: 13.5 g/dL (ref 13.0–17.0)
Hemoglobin: 13.8 g/dL (ref 13.0–17.0)
MCH: 31.3 pg (ref 26.0–34.0)
MCH: 30.8 pg (ref 26.0–34.0)
MCHC: 32.5 g/dL (ref 30.0–36.0)
MCHC: 31.4 g/dL (ref 30.0–36.0)
MCV: 96.3 fL (ref 80.0–100.0)
MCV: 98.2 fL (ref 80.0–100.0)
Platelets: 192 10*3/uL (ref 150–400)
Platelets: 153 10*3/uL (ref 150–400)
RBC: 4.31 MIL/uL (ref 4.22–5.81)
RBC: 4.48 MIL/uL (ref 4.22–5.81)
RDW: 14 % (ref 11.5–15.5)
RDW: 14.1 % (ref 11.5–15.5)
WBC: 20.4 10*3/uL — ABNORMAL HIGH (ref 4.0–10.5)
WBC: 16.5 10*3/uL — ABNORMAL HIGH (ref 4.0–10.5)
nRBC: 0 % (ref 0.0–0.2)
nRBC: 0 % (ref 0.0–0.2)

## 2021-12-21 LAB — BASIC METABOLIC PANEL
Anion gap: 9 (ref 5–15)
Anion gap: 14 (ref 5–15)
BUN: 19 mg/dL (ref 8–23)
BUN: 22 mg/dL (ref 8–23)
CO2: 20 mmol/L — ABNORMAL LOW (ref 22–32)
CO2: 18 mmol/L — ABNORMAL LOW (ref 22–32)
Calcium: 8.3 mg/dL — ABNORMAL LOW (ref 8.9–10.3)
Calcium: 8.3 mg/dL — ABNORMAL LOW (ref 8.9–10.3)
Chloride: 111 mmol/L (ref 98–111)
Chloride: 104 mmol/L (ref 98–111)
Creatinine, Ser: 1.59 mg/dL — ABNORMAL HIGH (ref 0.61–1.24)
Creatinine, Ser: 1.76 mg/dL — ABNORMAL HIGH (ref 0.61–1.24)
GFR, Estimated: 45 mL/min — ABNORMAL LOW (ref >60)
GFR, Estimated: 40 mL/min — ABNORMAL LOW (ref >60)
Glucose, Bld: 150 mg/dL — ABNORMAL HIGH (ref 70–99)
Glucose, Bld: 216 mg/dL — ABNORMAL HIGH (ref 70–99)
Potassium: 4.3 mmol/L (ref 3.5–5.1)
Potassium: 4.3 mmol/L (ref 3.5–5.1)
Sodium: 140 mmol/L (ref 135–145)
Sodium: 136 mmol/L (ref 135–145)

## 2021-12-21 LAB — MAGNESIUM
Magnesium: 2.7 mg/dL — ABNORMAL HIGH (ref 1.7–2.4)
Magnesium: 2.4 mg/dL (ref 1.7–2.4)

## 2021-12-21 LAB — GLUCOSE, CAPILLARY
Glucose-Capillary: 110 mg/dL — ABNORMAL HIGH (ref 70–99)
Glucose-Capillary: 112 mg/dL — ABNORMAL HIGH (ref 70–99)
Glucose-Capillary: 118 mg/dL — ABNORMAL HIGH (ref 70–99)
Glucose-Capillary: 120 mg/dL — ABNORMAL HIGH (ref 70–99)
Glucose-Capillary: 120 mg/dL — ABNORMAL HIGH (ref 70–99)
Glucose-Capillary: 133 mg/dL — ABNORMAL HIGH (ref 70–99)
Glucose-Capillary: 171 mg/dL — ABNORMAL HIGH (ref 70–99)
Glucose-Capillary: 210 mg/dL — ABNORMAL HIGH (ref 70–99)
Glucose-Capillary: 221 mg/dL — ABNORMAL HIGH (ref 70–99)
Glucose-Capillary: 95 mg/dL (ref 70–99)

## 2021-12-21 LAB — LIPID PANEL
Cholesterol: 56 mg/dL (ref 0–200)
HDL: 25 mg/dL — ABNORMAL LOW (ref >40)
LDL Cholesterol: 19 mg/dL (ref 0–99)
Total CHOL/HDL Ratio: 2.2 RATIO
Triglycerides: 60 mg/dL (ref <150)
VLDL: 12 mg/dL (ref 0–40)

## 2021-12-21 MED ORDER — AMIODARONE HCL IN DEXTROSE 360-4.14 MG/200ML-% IV SOLN
30.0000 mg/h | INTRAVENOUS | Status: DC
  Administered 2021-12-21: 30 mg/h via INTRAVENOUS
  Filled 2021-12-21 (×5): qty 200

## 2021-12-21 MED ORDER — ORAL CARE MOUTH RINSE
15.0000 mL | Freq: Two times a day (BID) | OROMUCOSAL | Status: DC
  Administered 2021-12-21 – 2021-12-26 (×9): 15 mL via OROMUCOSAL

## 2021-12-21 MED ORDER — INSULIN ASPART 100 UNIT/ML IJ SOLN: 0.0000 [IU] | INTRAMUSCULAR | Status: DC

## 2021-12-21 MED ORDER — ENOXAPARIN SODIUM 40 MG/0.4ML IJ SOSY
40.0000 mg | PREFILLED_SYRINGE | Freq: Every day | INTRAMUSCULAR | Status: DC
  Administered 2021-12-21 – 2021-12-26 (×6): 40 mg via SUBCUTANEOUS
  Filled 2021-12-21 (×6): qty 0.4

## 2021-12-21 MED ORDER — INSULIN ASPART 100 UNIT/ML IJ SOLN
5.0000 [IU] | Freq: Three times a day (TID) | INTRAMUSCULAR | Status: DC
  Administered 2021-12-22 – 2021-12-26 (×13): 5 [IU] via SUBCUTANEOUS

## 2021-12-21 MED ORDER — AMIODARONE HCL 200 MG PO TABS: 200.0000 mg | ORAL_TABLET | Freq: Two times a day (BID) | ORAL | Status: DC

## 2021-12-21 MED ORDER — INSULIN ASPART 100 UNIT/ML IJ SOLN: 5.0000 [IU] | Freq: Three times a day (TID) | INTRAMUSCULAR | Status: DC

## 2021-12-21 MED ORDER — INSULIN DETEMIR 100 UNIT/ML ~~LOC~~ SOLN
20.0000 [IU] | Freq: Once | SUBCUTANEOUS | Status: AC
  Administered 2021-12-21: 20 [IU] via SUBCUTANEOUS
  Filled 2021-12-21: qty 0.2

## 2021-12-21 MED ORDER — INSULIN DETEMIR 100 UNIT/ML ~~LOC~~ SOLN
20.0000 [IU] | Freq: Every day | SUBCUTANEOUS | Status: DC
  Filled 2021-12-21: qty 0.2

## 2021-12-21 MED ORDER — INSULIN ASPART 100 UNIT/ML IJ SOLN
0.0000 [IU] | Freq: Three times a day (TID) | INTRAMUSCULAR | Status: DC
  Administered 2021-12-21: 3 [IU] via SUBCUTANEOUS
  Administered 2021-12-22: 8 [IU] via SUBCUTANEOUS
  Administered 2021-12-22: 11 [IU] via SUBCUTANEOUS
  Administered 2021-12-22: 8 [IU] via SUBCUTANEOUS
  Administered 2021-12-23: 3 [IU] via SUBCUTANEOUS
  Administered 2021-12-23 (×2): 5 [IU] via SUBCUTANEOUS
  Administered 2021-12-24 – 2021-12-25 (×2): 3 [IU] via SUBCUTANEOUS
  Administered 2021-12-25 – 2021-12-26 (×3): 5 [IU] via SUBCUTANEOUS
  Administered 2021-12-26: 8 [IU] via SUBCUTANEOUS

## 2021-12-21 MED FILL — Potassium Chloride Inj 2 mEq/ML: INTRAVENOUS | Qty: 40 | Status: AC

## 2021-12-21 MED FILL — Heparin Sodium (Porcine) Inj 1000 Unit/ML: Qty: 1000 | Status: AC

## 2021-12-21 MED FILL — Lidocaine HCl Local Preservative Free (PF) Inj 2%: INTRAMUSCULAR | Qty: 15 | Status: AC

## 2021-12-21 NOTE — Progress Notes (Addendum)
Inpatient Diabetes Program Recommendations  AACE/ADA: New Consensus Statement on Inpatient Glycemic Control (2015)  Target Ranges:  Prepandial:   less than 140 mg/dL      Peak postprandial:   less than 180 mg/dL (1-2 hours)      Critically ill patients:  140 - 180 mg/dL   Lab Results  Component Value Date   GLUCAP 133 (H) 12/21/2021   HGBA1C 6.3 (H) 12/16/2021    Review of Glycemic Control  Latest Reference Range & Units 12/21/21 00:31 12/21/21 04:28 12/21/21 06:28 12/21/21 07:47 12/21/21 09:46  Glucose-Capillary 70 - 99 mg/dL 110 (H) 120 (H) 112 (H) 120 (H) 133 (H)   Diabetes history: DM 1 Outpatient Diabetes medications:  Insulin pump: 3.6 units/hr (total basal is 86.4 units/24 hours) and 5-15 units tid with meals Current orders for Inpatient glycemic control:  Levemir 20 units daily Novolog TCTS q 4 hours  Inpatient Diabetes Program Recommendations:    Consider increasing Levemir to 20 units bid.  Consider adding Novolog meal coverage 5 units tid with meals (hold if patient eats less than 50% or NPO).   Thanks,  Adah Perl, RN, BC-ADM Inpatient Diabetes Coordinator Pager [phone]  (8a-5p)

## 2021-12-21 NOTE — Progress Notes (Signed)
12/21/2021   I have seen and evaluated the patient for post CABG care.  S:  Doing well.  Sats 93% on 4LPM.  Has not started using [place] yet.  Remains on amio, low dose phenylephrine.  CVP 9  Pain under control.  O: Blood pressure 94/76, pulse 72, temperature 99.7 F (37.6 C), resp. rate (!) 24, height 5\' 11"  (1.803 m), weight 104.3 kg, SpO2 95 %.  No distress Chest drains minimal output Epicardial pacing wires in place Pulls 500-750 on IS Lungs diminished bases Mild edema  Cr up slightly, 3L Uop yesterday Mag good CBC benign  A:  CAD POD 1 CABG doing well Post CABG mild vasoplegia improving Post CABG mild AKI, good UoP, foley in place Peri-CABG Vfib/Vtach resolved on amio Postoperative ventilator management- did great, no issues. Some lingering O2 need related to atelectasis.  P:  Postop tube/line management per TCTS Encourage IS, mobility Neo to be weaned to systolic pressures Will follow while in ICU  Erskine Emery MD [facility] [place] epic messenger for cross cover needs If after hours, please call E-link

## 2021-12-21 NOTE — Progress Notes (Signed)
Patient ID: Jason Giles, male   DOB: [birth year], 76 y.o.   MRN: [id number]  TCTS Evening Rounds:  Hemodynamically stable  UO ok Sats 93% on [place]. Up to chair this evening.

## 2021-12-21 NOTE — Discharge Summary (Signed)
[address]       [place] [address]             [phone]    Physician Discharge Summary  Patient ID: Jason Giles MRN: [id number] DOB/AGE: 08-11-1946 76 y.o.  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 Kipp Brood in the office for surgical evaluation of three vessel coronary artery disease. This was discovered after he was evaluated after an episode of chest pain while laying in bed.  Since that time he has had no other episodes.  His left heart cath did show left main disease as well as in-stent restenosis in his circumflex stent. He denies any shortness of breath or orthopnea. Dr. Kipp Brood discussed the need for coronary artery disease. Potential risks, benefits, and complications of the surgery were discussed with the patient and he agreed to proceed with surgery. Pre operative carotid duplex US showed no significant  internal carotid artery stenosis bilaterally.  Hospital Course: Patient underwent a CABG x 3 . He was transferred from the OR to [facility] ICU in stable condition.  The patient was extubated the evening of surgery.  During his stay in the SICU his blood pressure was low and he required Neo to help with this, which was weaned as BP improved.  The patient had some brief runs of Atrial Fibrillation in the operating room, that required cardioversion.  He was placed on an Amiodarone drip, which will be discontinued on POD #2.  The patient was weaned off insulin drip to IV regimen.  Once oral intake improved he was restarted on his insulin pump. He was stable for transfer to the progressive care unit on 12/22/2021.  The patient developed confusion overnight.  This was felt to be related to sundowning.  Oxycodone was discontinued.  He was able to the sleep better over the next few days and his confusion gradually cleared.  He made slow progress with mobility but eventually regained independence with ambulation and transfers.  He developed acute renal insufficiency after surgery with a peak creatinine of 1.76.  This was trending back toward his baseline by the fourth postoperative day.  He was having episodes of Atrial Flutter, Fibrillation.  He was on Amiodarone at 400 mg BID.  Cardiology consult was requested to assist with rhythm management and they agreed with continuation of Amiodarone.  The patient maintained NSR with 1st degree AV Block.  He was ambulating without difficulty.  His surgical incisions are  healing without evidence of infection.  He is medically stable for discharge home today.  Consults: pulmonary/intensive care  Significant Diagnostic Studies: angiography:     Prox Cx lesion is 100% stenosed.   Prox LAD lesion is 50% stenosed.   Mid LM lesion is 50% stenosed.   Dist LAD lesion is 30% stenosed.   Mid RCA lesion is 80% stenosed.   LV end diastolic pressure is normal.   The left ventricular ejection  fraction is 55-65% by visual estimate.   1.  Multivessel disease consisting of left main, LAD, collateralized chronically occluded left circumflex, and high-grade right coronary disease. 2.  Preserved ejection fraction with LVEDP of 15 mmHg.   The patient will be referred for outpatient cardiothoracic surgical evaluation.  Treatments: surgery:   12/20/2021 Patient:  Jason Giles Pre-Op Dx: Three-vessel coronary artery disease                         Hypertension                         Hyperlipidemia                         Diabetes mellitus Post-op Dx: Same Procedure: CABG X 3.  LIMA to LAD.  Reverse saphenous vein graft obtuse marginal, reverse saphenous vein graft to the posterior lateral branch. Endoscopic greater saphenous vein harvest on the right     Surgeon and Role:      * Lightfoot, Lucile Crater, MD - Primary    *D. Ezekiel Slocumb- assisting An experienced assistant was required given the complexity of this surgery and the standard of surgical care. The assistant was needed for exposure, dissection, suctioning, retraction of delicate tissues and sutures, instrument exchange and for overall help during this procedure.    Discharge Exam: Blood pressure 122/63, pulse 69, temperature 98.5 F (36.9 C), temperature source Axillary, resp. rate 20, height 5\' 11"  (1.803 m), weight 105.6 kg, SpO2 96 %.  General appearance: alert, cooperative, and no distress Heart: regular rate and rhythm Lungs: clear to auscultation bilaterally Abdomen: soft, non-tender; bowel sounds normal; no masses,  no organomegaly Extremities: edema trace Wound: clean and dry   Discharge Medications:  The patient has been discharged on:   1.Beta Blocker:  Yes [ X  ]                              No   [   ]                              If No, reason:  2.Ace Inhibitor/ARB: Yes [   ]                                     No  [ x   ]                                     If No, reason: elevated  creatinine  3.Statin:   Yes [ X  ]                  No  [   ]  If No, reason:  4.Ecasa:  Yes  [ X  ]                  No   [   ]                  If No, reason:  Patient had ACS upon admission:  Plavix/P2Y12 inhibitor: Yes [   ]                                      No  [ X  ]     Discharge Instructions     Amb Referral to Cardiac Rehabilitation   Complete by: As directed    Diagnosis: CABG   CABG X ___: 3   After initial evaluation and assessments completed: Virtual Based Care may be provided alone or in conjunction with Phase 2 Cardiac Rehab based on patient barriers.: Yes      Allergies as of 12/27/2021   No Known Allergies      Medication List     TAKE these medications    acetaminophen 325 MG tablet Commonly known as: TYLENOL Take 2 tablets (650 mg total) by mouth every 6 (six) hours as needed for mild pain or fever.   amiodarone 200 MG tablet Commonly known as: PACERONE Take 2 tablets (400 mg total) by mouth 2 (two) times daily. X 2 days, then decrease to 200 mg BID x 7 days, then decrease to 200 mg daily   aspirin 81 MG tablet Take 81 mg by mouth daily.   b complex vitamins capsule Take 1 capsule by mouth daily.   benzonatate 100 MG capsule Commonly known as: TESSALON Take 1 capsule (100 mg total) by mouth 3 (three) times daily as needed for cough.   fenofibrate micronized 134 MG capsule Commonly known as: LOFIBRA Take 134 mg by mouth daily before breakfast.   furosemide 40 MG tablet Commonly known as: LASIX Take 1 tablet (40 mg total) by mouth daily.   insulin aspart 100 UNIT/ML injection Commonly known as: novoLOG Inject into the skin daily. Per pump basal rate 3.6 units/hour  bolus 5-15 units per meal   insulin pump Soln Inject into the skin continuous. Using Novolog   Jardiance 25 MG Tabs tablet Generic drug: empagliflozin Take 25 mg by mouth daily.   metFORMIN 850 MG tablet Commonly known as: GLUCOPHAGE Take 850 mg  by mouth daily with breakfast.   metoprolol tartrate 50 MG tablet Commonly known as: LOPRESSOR Take 1 tablet (50 mg total) by mouth 2 (two) times daily.   multivitamin Tabs tablet Take 1 tablet by mouth daily.   nitroGLYCERIN 0.4 MG SL tablet Commonly known as: NITROSTAT Place 1 tablet (0.4 mg total) under the tongue every 5 (five) minutes as needed for chest pain.   potassium chloride SA 20 MEQ tablet Commonly known as: KLOR-CON M Take 1 tablet (20 mEq total) by mouth daily.   rosuvastatin 40 MG tablet Commonly known as: CRESTOR Take 40 mg by mouth daily.   sodium chloride 0.65 % Soln nasal spray Commonly known as: OCEAN Place 1 spray into both nostrils as needed for congestion.   tobramycin 0.3 % ophthalmic solution Commonly known as: TOBREX Place 1 drop into both eyes See admin instructions. Instill 1 drop into both eyes daily for 2 days before and 2 days after monthly eye injections   traMADol 50  MG tablet Commonly known as: ULTRAM Take 1 tablet (50 mg total) by mouth every 4 (four) hours as needed for moderate pain.        Follow-up Information     Lightfoot, Lucile Crater, MD Follow up.   Specialty: Cardiothoracic Surgery Why: Appointment is via telephone;please do NOT go to the office. Dr. Kipp Brood will call you at 2:40 pm Contact information: [address] [place] [address] [phone]         Imogene Burn, [place]. Go on 01/09/2022.   Specialty: Cardiology Why: Appointment time is at 9:45 am Contact information: [address] [place] [address] [phone]                 Signed:  Ellwood Handler, PA-C 12/27/2021, 7:47 AM

## 2021-12-21 NOTE — Progress Notes (Addendum)
° °   °  [address]       [place] [address]             [phone]      1 Day Post-Op Procedure(s) (LRB): CORONARY ARTERY BYPASS GRAFTING (CABG) TIMES THREE, USING LEFT INTERNAL MAMMARY ARTERY AND ENDOSCOPICALLY HARVESTED RIGHT GREATER SAPHENOUS VEIN (N/A) TRANSESOPHAGEAL ECHOCARDIOGRAM (TEE) (N/A) ENDOVEIN HARVEST OF GREATER SAPHENOUS VEIN (Right)  Subjective:  Patient having some discomfort, pain responds to medication.  He was trained by Dr. Tamala Julian on how to use IS.  Denies N/V.  Objective: Vital signs in last 24 hours: Temp:  [97.7 F (36.5 C)-100.6 F (38.1 C)] 99.7 F (37.6 C) (02/08 0600) Pulse Rate:  [70-87] 72 (02/08 0600) Cardiac Rhythm: Normal sinus rhythm (02/08 0600) Resp:  [12-31] 24 (02/08 0600) BP: (81-109)/(49-85) 94/76 (02/08 0600) SpO2:  [93 %-100 %] 95 % (02/08 0600) Arterial Line BP: (94-137)/(45-72) 119/53 (02/08 0600) FiO2 (%):  [36 %-50 %] 36 % (02/07 1832)  Hemodynamic parameters for last 24 hours: CVP:  [2 mmHg-16 mmHg] 13 mmHg  Intake/Output from previous day: 02/07 0701 - 02/08 0700 In: 5127.5 [P.O.:50; I.V.:3438.8; Blood:660; IV Piggyback:978.7] Out: 4696 [Urine:3150; Blood:900; Chest Tube:290]  General appearance: alert, cooperative, and no distress Heart: regular rate and rhythm Lungs: diminished breath sounds bibasilar Abdomen: soft, non-tender; bowel sounds normal; no masses,  no organomegaly Extremities: edema trace Wound: clean and dry EVH site, mild oozing from stab incision... aquacel in place on sternum  Lab Results: Recent Labs    12/20/21 1755 12/21/21 0434  WBC 19.3* 20.4*  HGB 15.3 13.5  HCT 46.4 41.5  PLT 194 192   BMET:  Recent Labs    12/20/21 1755 12/21/21 0434  NA 140 140  K 4.6 4.3  CL 113* 111  CO2 20* 20*  GLUCOSE 135* 150*  BUN 17 19  CREATININE 1.30* 1.59*  CALCIUM 8.0* 8.3*    PT/INR:  Recent Labs    12/20/21 1248  LABPROT 15.9*  INR 1.3*   ABG    Component Value  Date/Time   PHART 7.378 12/20/2021 1133   HCO3 21.3 12/20/2021 1133   TCO2 22 12/20/2021 1136   ACIDBASEDEF 3.0 (H) 12/20/2021 1133   O2SAT 96.0 12/20/2021 1133   CBG (last 3)  Recent Labs    12/21/21 0031 12/21/21 0428 12/21/21 0628  GLUCAP 110* 120* 112*    Assessment/Plan: S/P Procedure(s) (LRB): CORONARY ARTERY BYPASS GRAFTING (CABG) TIMES THREE, USING LEFT INTERNAL MAMMARY ARTERY AND ENDOSCOPICALLY HARVESTED RIGHT GREATER SAPHENOUS VEIN (N/A) TRANSESOPHAGEAL ECHOCARDIOGRAM (TEE) (N/A) ENDOVEIN HARVEST OF GREATER SAPHENOUS VEIN (Right)  CV- NSR, BP is low- on Neo at 35 mcg, Amiodarone  Pulm- CXR with atelectasis, CT output 290 cc output leave in place today Renal- creatinine at 1.59,mildly blood tinged urine, will leave foley catheter in place today, mild edema on exam, monitor closely Expected post operative blood loss anemia, Hgb at 13.5 DM- sugars controlled, currently remains on insulin drip, will start Levemir and transition to SSIP.Marland Kitchen once patient more alert and adequate oral intake can resume insulin pump Dispo- patient stable wean Neo as BP tolerates, can transition to oral Amiodarone, CTs remain in place today, transition off insulin drip, POD #1 progression orders   LOS: 1 day    Erin Barrett, PA-C 12/21/2021  No events Doing well Will wean neo Will remove A line. Ambulation today Continue amio IV, and transition to Page

## 2021-12-22 ENCOUNTER — Inpatient Hospital Stay (HOSPITAL_COMMUNITY): Payer: Medicare Other

## 2021-12-22 DIAGNOSIS — Z951 Presence of aortocoronary bypass graft: Secondary | ICD-10-CM | POA: Diagnosis not present

## 2021-12-22 LAB — POCT I-STAT 7, (LYTES, BLD GAS, ICA,H+H)
Acid-base deficit: 5 mmol/L — ABNORMAL HIGH (ref 0.0–2.0)
Acid-base deficit: 6 mmol/L — ABNORMAL HIGH (ref 0.0–2.0)
Acid-base deficit: 7 mmol/L — ABNORMAL HIGH (ref 0.0–2.0)
Bicarbonate: 21.9 mmol/L (ref 20.0–28.0)
Bicarbonate: 19.6 mmol/L — ABNORMAL LOW (ref 20.0–28.0)
Bicarbonate: 18.2 mmol/L — ABNORMAL LOW (ref 20.0–28.0)
Calcium, Ion: 1.3 mmol/L (ref 1.15–1.40)
Calcium, Ion: 1.2 mmol/L (ref 1.15–1.40)
Calcium, Ion: 1.17 mmol/L (ref 1.15–1.40)
HCT: 42 % (ref 39.0–52.0)
HCT: 43 % (ref 39.0–52.0)
HCT: 42 % (ref 39.0–52.0)
Hemoglobin: 14.3 g/dL (ref 13.0–17.0)
Hemoglobin: 14.6 g/dL (ref 13.0–17.0)
Hemoglobin: 14.3 g/dL (ref 13.0–17.0)
O2 Saturation: 97 %
O2 Saturation: 97 %
O2 Saturation: 94 %
Patient temperature: 36.5
Patient temperature: 38
Patient temperature: 38
Potassium: 4.1 mmol/L (ref 3.5–5.1)
Potassium: 4.4 mmol/L (ref 3.5–5.1)
Potassium: 3.9 mmol/L (ref 3.5–5.1)
Sodium: 141 mmol/L (ref 135–145)
Sodium: 141 mmol/L (ref 135–145)
Sodium: 143 mmol/L (ref 135–145)
TCO2: 23 mmol/L (ref 22–32)
TCO2: 21 mmol/L — ABNORMAL LOW (ref 22–32)
TCO2: 19 mmol/L — ABNORMAL LOW (ref 22–32)
pCO2 arterial: 43.7 mmHg (ref 32.0–48.0)
pCO2 arterial: 39.5 mmHg (ref 32.0–48.0)
pCO2 arterial: 37 mmHg (ref 32.0–48.0)
pH, Arterial: 7.305 — ABNORMAL LOW (ref 7.350–7.450)
pH, Arterial: 7.309 — ABNORMAL LOW (ref 7.350–7.450)
pH, Arterial: 7.305 — ABNORMAL LOW (ref 7.350–7.450)
pO2, Arterial: 99 mmHg (ref 83.0–108.0)
pO2, Arterial: 105 mmHg (ref 83.0–108.0)
pO2, Arterial: 82 mmHg — ABNORMAL LOW (ref 83.0–108.0)

## 2021-12-22 LAB — BASIC METABOLIC PANEL
Anion gap: 14 (ref 5–15)
BUN: 27 mg/dL — ABNORMAL HIGH (ref 8–23)
CO2: 20 mmol/L — ABNORMAL LOW (ref 22–32)
Calcium: 8.3 mg/dL — ABNORMAL LOW (ref 8.9–10.3)
Chloride: 102 mmol/L (ref 98–111)
Creatinine, Ser: 1.75 mg/dL — ABNORMAL HIGH (ref 0.61–1.24)
GFR, Estimated: 40 mL/min — ABNORMAL LOW (ref >60)
Glucose, Bld: 266 mg/dL — ABNORMAL HIGH (ref 70–99)
Potassium: 4 mmol/L (ref 3.5–5.1)
Sodium: 136 mmol/L (ref 135–145)

## 2021-12-22 LAB — CBC
HCT: 39.9 % (ref 39.0–52.0)
Hemoglobin: 12.6 g/dL — ABNORMAL LOW (ref 13.0–17.0)
MCH: 30.4 pg (ref 26.0–34.0)
MCHC: 31.6 g/dL (ref 30.0–36.0)
MCV: 96.4 fL (ref 80.0–100.0)
Platelets: 115 10*3/uL — ABNORMAL LOW (ref 150–400)
RBC: 4.14 MIL/uL — ABNORMAL LOW (ref 4.22–5.81)
RDW: 14.1 % (ref 11.5–15.5)
WBC: 12.7 10*3/uL — ABNORMAL HIGH (ref 4.0–10.5)
nRBC: 0 % (ref 0.0–0.2)

## 2021-12-22 LAB — GLUCOSE, CAPILLARY
Glucose-Capillary: 273 mg/dL — ABNORMAL HIGH (ref 70–99)
Glucose-Capillary: 334 mg/dL — ABNORMAL HIGH (ref 70–99)
Glucose-Capillary: 276 mg/dL — ABNORMAL HIGH (ref 70–99)
Glucose-Capillary: 247 mg/dL — ABNORMAL HIGH (ref 70–99)

## 2021-12-22 MED ORDER — SODIUM CHLORIDE 0.9% FLUSH
3.0000 mL | Freq: Two times a day (BID) | INTRAVENOUS | Status: DC
  Administered 2021-12-22: 3 mL via INTRAVENOUS

## 2021-12-22 MED ORDER — AMIODARONE HCL 200 MG PO TABS
400.0000 mg | ORAL_TABLET | Freq: Two times a day (BID) | ORAL | Status: DC
  Administered 2021-12-22 – 2021-12-27 (×11): 400 mg via ORAL
  Filled 2021-12-22 (×11): qty 2

## 2021-12-22 MED ORDER — METOPROLOL TARTRATE 25 MG PO TABS
25.0000 mg | ORAL_TABLET | Freq: Two times a day (BID) | ORAL | Status: DC
  Administered 2021-12-22 – 2021-12-25 (×8): 25 mg via ORAL
  Filled 2021-12-22 (×8): qty 1

## 2021-12-22 MED ORDER — INSULIN DETEMIR 100 UNIT/ML ~~LOC~~ SOLN
25.0000 [IU] | Freq: Every day | SUBCUTANEOUS | Status: DC
  Filled 2021-12-22: qty 0.25

## 2021-12-22 MED ORDER — FUROSEMIDE 10 MG/ML IJ SOLN
40.0000 mg | Freq: Once | INTRAMUSCULAR | Status: AC
  Administered 2021-12-22: 40 mg via INTRAVENOUS
  Filled 2021-12-22: qty 4

## 2021-12-22 MED ORDER — POTASSIUM CHLORIDE CRYS ER 20 MEQ PO TBCR
40.0000 meq | EXTENDED_RELEASE_TABLET | Freq: Every day | ORAL | Status: DC
  Administered 2021-12-22 – 2021-12-27 (×6): 40 meq via ORAL
  Filled 2021-12-22 (×6): qty 2

## 2021-12-22 MED ORDER — INSULIN DETEMIR 100 UNIT/ML ~~LOC~~ SOLN
20.0000 [IU] | Freq: Two times a day (BID) | SUBCUTANEOUS | Status: DC
  Administered 2021-12-22 – 2021-12-23 (×3): 20 [IU] via SUBCUTANEOUS
  Filled 2021-12-22 (×6): qty 0.2

## 2021-12-22 MED ORDER — SODIUM CHLORIDE 0.9 % IV SOLN: 250.0000 mL | INTRAVENOUS | Status: DC | PRN

## 2021-12-22 MED ORDER — ~~LOC~~ CARDIAC SURGERY, PATIENT & FAMILY EDUCATION: Freq: Once | Status: AC

## 2021-12-22 MED ORDER — SODIUM CHLORIDE 0.9% FLUSH: 3.0000 mL | INTRAVENOUS | Status: DC | PRN

## 2021-12-22 MED ORDER — FUROSEMIDE 40 MG PO TABS
40.0000 mg | ORAL_TABLET | Freq: Every day | ORAL | Status: DC
  Administered 2021-12-23 – 2021-12-27 (×5): 40 mg via ORAL
  Filled 2021-12-22 (×5): qty 1

## 2021-12-22 MED FILL — Sodium Chloride IV Soln 0.9%: INTRAVENOUS | Qty: 2000 | Status: AC

## 2021-12-22 MED FILL — Heparin Sodium (Porcine) Inj 1000 Unit/ML: INTRAMUSCULAR | Qty: 10 | Status: AC

## 2021-12-22 MED FILL — Sodium Bicarbonate IV Soln 8.4%: INTRAVENOUS | Qty: 50 | Status: AC

## 2021-12-22 MED FILL — Mannitol IV Soln 20%: INTRAVENOUS | Qty: 500 | Status: AC

## 2021-12-22 MED FILL — Calcium Chloride Inj 10%: INTRAVENOUS | Qty: 10 | Status: AC

## 2021-12-22 MED FILL — Electrolyte-R (PH 7.4) Solution: INTRAVENOUS | Qty: 4000 | Status: AC

## 2021-12-22 NOTE — Progress Notes (Signed)
CARDIAC REHAB PHASE I   PRE:  Rate/Rhythm: 79 SR  BP:  Lying: 125/59    Sitting: 124/63      SaO2: 95 4L  MODE:  Ambulation: 200 ft   POST:  Rate/Rhythm: 89 SR  BP:  Sitting: 152/67    SaO2: 100 4L  --> 92 3L   Pt drowsy but agreeable to ambulate. Pt assisted to EOB. Pt c/o some dizziness, BP rechecked and stable. Pt states he is seeing balloons and birds in his room, pt reoriented. Pt assist x2 to stand and ambulate 274ft in hallway with EVA and gait belt. Pt denies CP, SOB, or dizziness throughout. Pt assisted back into bed. Encouraged sitting in recliner for dinner and continued IS use. Wife at bedside. Will continue to follow.  [phone] Rufina Falco, RN BSN 12/22/2021 3:12 PM

## 2021-12-22 NOTE — Progress Notes (Signed)
° °   °  [address]       [place] [address]             [phone]                 2 Days Post-Op Procedure(s) (LRB): CORONARY ARTERY BYPASS GRAFTING (CABG) TIMES THREE, USING LEFT INTERNAL MAMMARY ARTERY AND ENDOSCOPICALLY HARVESTED RIGHT GREATER SAPHENOUS VEIN (N/A) TRANSESOPHAGEAL ECHOCARDIOGRAM (TEE) (N/A) ENDOVEIN HARVEST OF GREATER SAPHENOUS VEIN (Right)   Events: No events _______________________________________________________________ Vitals: BP (!) 141/58 (BP Location: Left Arm)    Pulse 79    Temp 98.5 F (36.9 C) (Oral)    Resp 18    Ht 5\' 11"  (1.803 m)    Wt 104 kg    SpO2 92%    BMI 31.98 kg/m  Filed Weights   12/20/21 0554 12/22/21 0500  Weight: 104.3 kg 104 kg     - Neuro: alert NAD  - Cardiovascular: sinus  Drips: amio.      - Pulm: EWOB    ABG    Component Value Date/Time   PHART 7.305 (L) 12/20/2021 1902   PCO2ART 37.0 12/20/2021 1902   PO2ART 82 (L) 12/20/2021 1902   HCO3 18.2 (L) 12/20/2021 1902   TCO2 19 (L) 12/20/2021 1902   ACIDBASEDEF 7.0 (H) 12/20/2021 1902   O2SAT 94.0 12/20/2021 1902    - Abd: ND - Extremity: warm  .Intake/Output      02/08 0701 02/09 0700 02/09 0701 02/10 0700   P.O.  240   I.V. (mL/kg) 635.1 (6.1) 45.3 (0.4)   Blood     IV Piggyback 300 93.9   Total Intake(mL/kg) 935.1 (9) 379.2 (3.6)   Urine (mL/kg/hr) 1150 (0.5) 1300 (1.1)   Blood     Chest Tube 160 60   Total Output 1310 1360   Net -374.9 -980.8        Urine Occurrence 2 x 1 x      _______________________________________________________________ Labs: CBC Latest Ref Rng & Units 12/22/2021 12/21/2021 12/21/2021  WBC 4.0 - 10.5 K/uL 12.7(H) 16.5(H) 20.4(H)  Hemoglobin 13.0 - 17.0 g/dL 12.6(L) 13.8 13.5  Hematocrit 39.0 - 52.0 % 39.9 44.0 41.5  Platelets 150 - 400 K/uL 115(L) 153 192   CMP Latest Ref Rng & Units 12/22/2021 12/21/2021 12/21/2021  Glucose 70 - 99 mg/dL 266(H) 216(H) 150(H)  BUN 8 - 23 mg/dL 27(H) 22 19  Creatinine 0.61 -  1.24 mg/dL 1.75(H) 1.76(H) 1.59(H)  Sodium 135 - 145 mmol/L 136 136 140  Potassium 3.5 - 5.1 mmol/L 4.0 4.3 4.3  Chloride 98 - 111 mmol/L 102 104 111  CO2 22 - 32 mmol/L 20(L) 18(L) 20(L)  Calcium 8.9 - 10.3 mg/dL 8.3(L) 8.3(L) 8.3(L)  Total Protein 6.5 - 8.1 g/dL - - -  Total Bilirubin 0.3 - 1.2 mg/dL - - -  Alkaline Phos 38 - 126 U/L - - -  AST 15 - 41 U/L - - -  ALT 0 - 44 U/L - - -    CXR: clear  _______________________________________________________________  Assessment and Plan: POD 2 s/p CABG  Neuro: pain controlled CV: on A/S/BB Pulm: will remove CT Renal: creat stable.  diuresing GI: on diet Heme: stable ID: afebrile Endo: increasing insulin.  Awaiting insulin pump Dispo: floor   Jason Giles 12/22/2021 5:55 PM

## 2021-12-22 NOTE — Progress Notes (Signed)
Pt transferred by this RN to 4E12 via wheelchair on telemetry monitoring and 4L [place] O2. Pt assisted from wheelchair to bed and received by RN.

## 2021-12-22 NOTE — Progress Notes (Signed)
Mobility Specialist Progress Note   12/22/21 1200  Mobility  Activity Ambulated with assistance to bathroom  Level of Assistance Moderate assist, patient does 50-74%  Assistive Device Front wheel walker  Distance Ambulated (ft) 16 ft  Activity Response Tolerated well  $Mobility charge 1 Mobility   Received pt in BR requesting help to recliner, successful void. Mod A for initial stand but contact guard for tx over. Min cues need for sternal precautions but no reported fault or pain. Left in chair w/ call bell in reach.   Holland Falling Mobility Specialist Phone Number [phone]

## 2021-12-22 NOTE — Plan of Care (Signed)

## 2021-12-22 NOTE — Progress Notes (Signed)
12/22/2021  I have seen and evaluated the patient for post CABG care.  S:  Had some delirium last night. Pain under reasonable control. Still SOB but improved. Sats low 90s on 4LPM  O: Blood pressure 128/61, pulse 90, temperature 98.5 F (36.9 C), temperature source Oral, resp. rate (!) 33, height 5\' 11"  (1.803 m), weight 104 kg, SpO2 96 %.  No distress Lung sounds diminished at bases Abdomen soft Ext warm No edema Drains with minimal output Moves all 4 ext to command Mildly confused  Cr up a bit but not significantly 1150 Uop CBC improved Off insulin drip but sugars are uncontrolled: levemir being increased  A:  CAD POD 2 CABG doing well Post CABG mild vasoplegia resolved Post CABG mild AKI, stable, lasix challenge given Peri-CABG Vfib/Vtach resolved on amio Postoperative ventilator management- resolved DM2 with hyperglycemia- insulin increased per DC recs Postop delirium, mild stable  P:  Insulin increased Encourage day/night cycle Diuretics/drain management per TCTS Encourage mobility, IS Will follow while in ICU  Erskine Emery MD [place] epic messenger for cross cover needs If after hours, please call E-link

## 2021-12-23 LAB — BASIC METABOLIC PANEL
Anion gap: 10 (ref 5–15)
BUN: 35 mg/dL — ABNORMAL HIGH (ref 8–23)
CO2: 22 mmol/L (ref 22–32)
Calcium: 8.5 mg/dL — ABNORMAL LOW (ref 8.9–10.3)
Chloride: 105 mmol/L (ref 98–111)
Creatinine, Ser: 1.59 mg/dL — ABNORMAL HIGH (ref 0.61–1.24)
GFR, Estimated: 45 mL/min — ABNORMAL LOW (ref >60)
Glucose, Bld: 180 mg/dL — ABNORMAL HIGH (ref 70–99)
Potassium: 3.9 mmol/L (ref 3.5–5.1)
Sodium: 137 mmol/L (ref 135–145)

## 2021-12-23 LAB — GLUCOSE, CAPILLARY
Glucose-Capillary: 184 mg/dL — ABNORMAL HIGH (ref 70–99)
Glucose-Capillary: 242 mg/dL — ABNORMAL HIGH (ref 70–99)
Glucose-Capillary: 243 mg/dL — ABNORMAL HIGH (ref 70–99)
Glucose-Capillary: 156 mg/dL — ABNORMAL HIGH (ref 70–99)

## 2021-12-23 LAB — CBC
HCT: 38.8 % — ABNORMAL LOW (ref 39.0–52.0)
Hemoglobin: 13 g/dL (ref 13.0–17.0)
MCH: 31.1 pg (ref 26.0–34.0)
MCHC: 33.5 g/dL (ref 30.0–36.0)
MCV: 92.8 fL (ref 80.0–100.0)
Platelets: 125 10*3/uL — ABNORMAL LOW (ref 150–400)
RBC: 4.18 MIL/uL — ABNORMAL LOW (ref 4.22–5.81)
RDW: 13.9 % (ref 11.5–15.5)
WBC: 11.3 10*3/uL — ABNORMAL HIGH (ref 4.0–10.5)
nRBC: 0 % (ref 0.0–0.2)

## 2021-12-23 MED ORDER — INSULIN ASPART 100 UNIT/ML IJ SOLN: 5.0000 [IU] | Freq: Three times a day (TID) | INTRAMUSCULAR | Status: DC

## 2021-12-23 MED ORDER — INSULIN DETEMIR 100 UNIT/ML ~~LOC~~ SOLN
24.0000 [IU] | Freq: Two times a day (BID) | SUBCUTANEOUS | Status: DC
  Administered 2021-12-23 – 2021-12-27 (×8): 24 [IU] via SUBCUTANEOUS
  Filled 2021-12-23 (×9): qty 0.24

## 2021-12-23 NOTE — Progress Notes (Signed)
°   °  [address]       [place] [address]             [phone]       Some confusion overnight Remains in sinus  Vitals:   12/22/21 2247 12/23/21 0406  BP: 132/66 136/63  Pulse: 75 80  Resp: 20 20  Temp: 98 F (36.7 C) 98.3 F (36.8 C)  SpO2: 96% 96%   Alert NAD Sinus EWOB   Labs reviewed Creat down  POD 3. S/p CABG Continue to monitor mental status Creat down, gentle diuresis today On A/S/BB BG improved Dispo planning  Costantino Kohlbeck O Barak Bialecki

## 2021-12-23 NOTE — Evaluation (Signed)
Physical Therapy Evaluation Patient Details Name: Jason Giles MRN: [id number] DOB: 1946-10-01 Today's Date: 12/23/2021  History of Present Illness  Patient is a 76 y/o male who presents on 12/20/21 s/p CABGx3. PMH includes HTN, CAD, DM.  Clinical Impression  Patient presents with generalized weakness, decreased mobility due to sternal precautions, impaired cognition, pain, and impaired balance s/p above. Pt lives at home with wife and is independent for ADLs and IADLs PTA. Today, pt requires Mod A for bed mobility and Min A-mod A for standing transfers. Sp02 remained in high 90s on RA throughout activity. Education on sternal precautions and "move in the tube," and problem solved home setup and need for DME. Pt has to negotiate 3 stairs to enter home. May need HHPT pending improvement in mobility and cognition but has good family support from wife. Wife will see if she can borrow a RW. Will follow acutely to maximize independence and mobility prior to return home.     Recommendations for follow up therapy are one component of a multi-disciplinary discharge planning process, led by the attending physician.  Recommendations may be updated based on patient status, additional functional criteria and insurance authorization.  Follow Up Recommendations Home health PT (pending improvement)    Assistance Recommended at Discharge Frequent or constant Supervision/Assistance  Patient can return home with the following  A little help with walking and/or transfers;A little help with bathing/dressing/bathroom;Assistance with cooking/housework;Assist for transportation;Help with stairs or ramp for entrance    Equipment Recommendations Rolling walker (2 wheels) (wife going to see if she can borrow one)  Recommendations for Other Services       Functional Status Assessment Patient has had a recent decline in their functional status and demonstrates the ability to make significant improvements in function in  a reasonable and predictable amount of time.     Precautions / Restrictions Precautions Precautions: Sternal Precaution Booklet Issued: Yes (comment) Precaution Comments: Reviewed "move in the tube" and sternal precautions Restrictions Weight Bearing Restrictions: Yes Other Position/Activity Restrictions: sternal precautions      Mobility  Bed Mobility Overal bed mobility: Needs Assistance Bed Mobility: Rolling, Sidelying to Sit Rolling: Min assist Sidelying to sit: Mod assist, HOB elevated       General bed mobility comments: Pt holding heart pillow, cues to look towards right and lead with head to get to EOB, heavy Mod A with trunk.    Transfers Overall transfer level: Needs assistance Equipment used: Rolling walker (2 wheels) Transfers: Sit to/from Stand Sit to Stand: Mod assist, Min guard, Min assist           General transfer comment: Min A initially to power to standing with cues to hold heart pillow, heavy Mod A to stand from low toilet. Cues for anterior weight shift and forward momentum.    Ambulation/Gait Ambulation/Gait assistance: Min assist Gait Distance (Feet): 20 Feet (x2 bouts) Assistive device: Rolling walker (2 wheels) Gait Pattern/deviations: Step-through pattern, Decreased stride length Gait velocity: decreased Gait velocity interpretation: <1.31 ft/sec, indicative of household ambulator   General Gait Details: Slow, mildly unsteady gait with cues for RW proximity/management. Poor awareness of lines. Posterior lean when backing up to bed.  Stairs            Wheelchair Mobility    Modified Rankin (Stroke Patients Only)       Balance Overall balance assessment: Needs assistance, History of Falls Sitting-balance support: Feet supported, No upper extremity supported Sitting balance-Leahy Scale: Good     Standing  balance support: During functional activity Standing balance-Leahy Scale: Fair Standing balance comment: Able to stand  staticaly without UE support, but needs UE support for dynamic tasks/walking.                             Pertinent Vitals/Pain Pain Assessment Pain Assessment: 0-10 Pain Score: 4  Pain Location: chest Pain Descriptors / Indicators: Sore Pain Intervention(s): Monitored during session, Repositioned    Home Living Family/patient expects to be discharged to:: Private residence Living Arrangements: Spouse/significant other Available Help at Discharge: Family;Available 24 hours/day Type of Home: House Home Access: Stairs to enter Entrance Stairs-Rails: Left Entrance Stairs-Number of Steps: 3   Home Layout: One level Home Equipment: Cane - single point      Prior Function Prior Level of Function : Independent/Modified Independent             Mobility Comments: Independent, driving, falls out of bed sometimes due to vivid dreams ADLs Comments: independent     Hand Dominance   Dominant Hand: Left    Extremity/Trunk Assessment   Upper Extremity Assessment Upper Extremity Assessment: Defer to OT evaluation    Lower Extremity Assessment Lower Extremity Assessment: Generalized weakness (but functional)       Communication   Communication: No difficulties  Cognition Arousal/Alertness: Awake/alert Behavior During Therapy: WFL for tasks assessed/performed Overall Cognitive Status: Impaired/Different from baseline Area of Impairment: Memory, Following commands, Problem solving                     Memory: Decreased short-term memory, Decreased recall of precautions Following Commands: Follows one step commands consistently     Problem Solving: Slow processing, Requires verbal cues General Comments: Per wife, pt with some confusion this AM; better this morning than he has been. Follows commands well.        General Comments General comments (skin integrity, edema, etc.): Wife present and asking appropriate questions during session. Reports she has  a recliner for pt to sleep in as well as bought a new couch that is firm.    Exercises     Assessment/Plan    PT Assessment Patient needs continued PT services  PT Problem List Decreased strength;Decreased mobility;Decreased safety awareness;Obesity;Decreased skin integrity;Pain;Decreased balance;Decreased knowledge of use of DME;Decreased cognition;Decreased activity tolerance       PT Treatment Interventions Therapeutic exercise;Gait training;Stair training;Patient/family education;Therapeutic activities;Functional mobility training;Balance training;DME instruction    PT Goals (Current goals can be found in the Care Plan section)  Acute Rehab PT Goals Patient Stated Goal: return to independence PT Goal Formulation: With patient/family Time For Goal Achievement: 01/06/22 Potential to Achieve Goals: Good    Frequency Min 3X/week     Co-evaluation               AM-PAC PT "6 Clicks" Mobility  Outcome Measure Help needed turning from your back to your side while in a flat bed without using bedrails?: A Little Help needed moving from lying on your back to sitting on the side of a flat bed without using bedrails?: A Lot Help needed moving to and from a bed to a chair (including a wheelchair)?: A Lot Help needed standing up from a chair using your arms (e.g., wheelchair or bedside chair)?: A Lot Help needed to walk in hospital room?: A Little Help needed climbing 3-5 steps with a railing? : A Little 6 Click Score: 15    End of Session Equipment  Utilized During Treatment: Gait belt Activity Tolerance: Patient tolerated treatment well Patient left: in bed;with call bell/phone within reach;with bed alarm set;with family/visitor present;with nursing/sitter in room Nurse Communication: Mobility status;Other (comment) (no need for 02) PT Visit Diagnosis: Muscle weakness (generalized) (M62.81);Other abnormalities of gait and mobility (R26.89)    Time: 2297-9892 PT Time  Calculation (min) (ACUTE ONLY): 28 min   Charges:   PT Evaluation $PT Eval Moderate Complexity: 1 Mod PT Treatments $Therapeutic Activity: 8-22 mins        Marisa Severin, PT, DPT Acute Rehabilitation Services Pager [phone] Office [phone]     Marguarite Arbour A Sabra Heck 12/23/2021, 12:23 PM

## 2021-12-23 NOTE — Care Management Important Message (Signed)
Important Message  Patient Details  Name: Jason Giles MRN: [id number] Date of Birth: Aug 03, 1946   Medicare Important Message Given:  Yes     Shelda Altes 12/23/2021, 10:18 AM

## 2021-12-23 NOTE — Progress Notes (Signed)
Mobility Specialist Progress Note:   12/23/21 1550  Mobility  Activity Ambulated with assistance in hallway  Level of Assistance Minimal assist, patient does 75% or more  Assistive Device Front wheel walker  Distance Ambulated (ft) 100 ft  Activity Response Tolerated well  $Mobility charge 1 Mobility    Pre Mobility: HR 85bpm Post Mobility: HR 88bpm  Pt required minA to stand from recliner, with cues to maintain sternal precautions. Pt not requiring any physical assist otherwise. Pt left back in chair with all needs met.   Nelta Numbers Mobility Specialist  Phone: [phone]

## 2021-12-23 NOTE — Progress Notes (Addendum)
Inpatient Diabetes Program Recommendations  AACE/ADA: New Consensus Statement on Inpatient Glycemic Control (2015)  Target Ranges:  Prepandial:   less than 140 mg/dL      Peak postprandial:   less than 180 mg/dL (1-2 hours)      Critically ill patients:  140 - 180 mg/dL   Lab Results  Component Value Date   GLUCAP 242 (H) 12/23/2021   HGBA1C 6.3 (H) 12/16/2021    Review of Glycemic Control  Latest Reference Range & Units 12/22/21 08:09 12/22/21 11:32 12/22/21 16:02 12/22/21 21:12 12/23/21 06:20 12/23/21 11:27  Glucose-Capillary 70 - 99 mg/dL 273 (H) 334 (H) 276 (H) 247 (H) 184 (H) 242 (H)   Diabetes history: DM 2 Outpatient Diabetes medications:  Insulin pump: 3.6 units/hr (total basal is 86.4 units/24 hours) and 5-15 units tid with meals Current orders for Inpatient glycemic control:  Levemir 20 units bid Novolog moderate tid with meals Novolog 5 units tid with meals  Inpatient Diabetes Program Recommendations:    Consider increasing Levemir to 24 units bid.   Thanks,  Adah Perl, RN, BC-ADM Inpatient Diabetes Coordinator Pager [phone]  (8a-5p)  Addendum:  Spoke to patient regarding insulin pump.  He is not wearing insulin pump right now.  He was on higher basal rates then what he is requiring in the hospital.  Recommend that he is d/c'd with basal insulin (Levemir) when he leaves until he can follow back up with Dr. Osborne Casco and Oren Section, CDE, Pharm D.  He is wearing CGM and states that his readings are different then the CBG's checked here in the hospital.  Reminded him that the CGM readings are about 15 minutes behind the CBG readings since it is testing the interstitial fluid.  Patient appreciative of visit.    ** At d/c he needs basal insulin ordered (Levemir) until he can f/u with PCP.

## 2021-12-23 NOTE — Progress Notes (Signed)
CARDIAC REHAB PHASE I   PRE:  Rate/Rhythm: 80 SR  BP:  Sitting: 123/65      SaO2: 95 RA  MODE:  Ambulation: 200 ft   POST:  Rate/Rhythm: 96 SR  BP:  Sitting: 115/55    SaO2: 98 RA   Pt heavy assist to stand, than ambulated 23ft in hallway assist of one with gait belt and front wheel walker. Pt denies CP, SOB, or dizziness throughout. Pt returned to recliner. Encouraged continued IS use and ambulation. Will continue to follow.  [id number] Rufina Falco, RN BSN 12/23/2021 3:04 PM

## 2021-12-24 LAB — GLUCOSE, CAPILLARY
Glucose-Capillary: 91 mg/dL (ref 70–99)
Glucose-Capillary: 91 mg/dL (ref 70–99)
Glucose-Capillary: 160 mg/dL — ABNORMAL HIGH (ref 70–99)
Glucose-Capillary: 184 mg/dL — ABNORMAL HIGH (ref 70–99)

## 2021-12-24 NOTE — Progress Notes (Signed)
CARDIAC REHAB PHASE I   PRE:  Rate/Rhythm: 82 with PACs, irregular  BP:  Supine: 141/89 Sitting:   Standing:    SaO2: 955 RA  MODE:  Ambulation: 160 ft   POST:  Rate/Rhythm: 105 Afib  BP:  Supine:   Sitting: 147/82  Standing:    SaO2: 100% RA  [id number] Two person assist to get from supine to seated to standing. One person assist for ambulation. Patient tolerated ambulation fair with assist x1 and pushing rolling walker, one standing rest break taken. To chair after walk. [facility] education reviewed with patient and patient's wife including, sternal precautions, IS use, coughing, daily weights, restrictions, risk factor modification, and activity progression. Instructed on how to view Recovering from Surgery video. Pt verbalizes understanding. Discussed phase 2 cardiac rehab, and pt interested in the program at [facility], referral sent.   Sol Passer, MS, ACSM CEP

## 2021-12-24 NOTE — Progress Notes (Signed)
Pt walked 25ft this evening Activity tolerated well  Front wheel walker used

## 2021-12-24 NOTE — Progress Notes (Addendum)
° °   °  [address]       [place] [address]             [phone]      4 Days Post-Op Procedure(s) (LRB): CORONARY ARTERY BYPASS GRAFTING (CABG) TIMES THREE, USING LEFT INTERNAL MAMMARY ARTERY AND ENDOSCOPICALLY HARVESTED RIGHT GREATER SAPHENOUS VEIN (N/A) TRANSESOPHAGEAL ECHOCARDIOGRAM (TEE) (N/A) ENDOVEIN HARVEST OF GREATER SAPHENOUS VEIN (Right) Subjective: Up in the bedside chair. His wife is with him this morning and notes his confusion is improving but not resolved.   Objective: Vital signs in last 24 hours: Temp:  [97.6 F (36.4 C)-98.3 F (36.8 C)] 97.6 F (36.4 C) (02/11 1140) Pulse Rate:  [65-91] 91 (02/11 1140) Cardiac Rhythm: Atrial fibrillation (02/10 1920) Resp:  [18-20] 20 (02/11 1140) BP: (109-152)/(59-82) 147/82 (02/11 1140) SpO2:  [90 %-100 %] 99 % (02/11 1140) Weight:  [106.2 kg] 106.2 kg (02/11 0500)     Intake/Output from previous day: 02/10 0701 - 02/11 0700 In: 480 [P.O.:480] Out: 440 [Urine:440] Intake/Output this shift: No intake/output data recorded.  General appearance: alert, cooperative, and mild confusion. Neurologic: no focal m/s deficits Heart: SR with PAF with controlled V-rate. Lungs: breath sounds are clear, O2 sats OK on RA.  Abdomen: soft, non-tender Extremities: trace LE edema Wound: The sternotomy incision and RLE EVH incision are intact and dry.   Lab Results: Recent Labs    12/22/21 0713 12/23/21 0202  WBC 12.7* 11.3*  HGB 12.6* 13.0  HCT 39.9 38.8*  PLT 115* 125*   BMET:  Recent Labs    12/22/21 0713 12/23/21 0202  NA 136 137  K 4.0 3.9  CL 102 105  CO2 20* 22  GLUCOSE 266* 180*  BUN 27* 35*  CREATININE 1.75* 1.59*  CALCIUM 8.3* 8.5*    PT/INR: No results for input(s): LABPROT, INR in the last 72 hours. ABG    Component Value Date/Time   PHART 7.305 (L) 12/20/2021 1902   HCO3 18.2 (L) 12/20/2021 1902   TCO2 19 (L) 12/20/2021 1902   ACIDBASEDEF 7.0 (H) 12/20/2021 1902   O2SAT 94.0  12/20/2021 1902   CBG (last 3)  Recent Labs    12/23/21 2112 12/24/21 0610 12/24/21 1142  GLUCAP 156* 91 91    Assessment/Plan: S/P Procedure(s) (LRB): CORONARY ARTERY BYPASS GRAFTING (CABG) TIMES THREE, USING LEFT INTERNAL MAMMARY ARTERY AND ENDOSCOPICALLY HARVESTED RIGHT GREATER SAPHENOUS VEIN (N/A) TRANSESOPHAGEAL ECHOCARDIOGRAM (TEE) (N/A) ENDOVEIN HARVEST OF GREATER SAPHENOUS VEIN (Right)  -POD4 CABG x 3 after presenting with unstable angina and normal EF. Stable VS. Had peri-operative VF and was started on amiodarone. Now having paroxysmal atrial fibrillation with controlled rates. Continue amiodarone load  along with the metoprolol, ASA, and Crestor. D/C the pacer wires today.   -Type 2 DM- Has an insulin pump but it is not active. Glucose controled with Levemir 24u BID, Novalog 5u cc,  and SSI.  -Expected acute blood loss anemia-Hct stable.   -Acute renal insufficiency-Baseline Creat 1.1--> peaked at 1.76--> now trending back down.   -Confusion- seems to be improving. D/C oxycodone  -Volume excess- still 2kg +. Continue oral Laisx  -DVT PPX- on daily enoxaparin.   -Disposition- anticipate discharge to home in 1-2 days if rhythm stabilizes and confusion continues to clear.    LOS: 4 days    Antony Odea, [place] [phone] 12/24/2021  Agree with above.

## 2021-12-25 ENCOUNTER — Inpatient Hospital Stay (HOSPITAL_COMMUNITY): Payer: Medicare Other

## 2021-12-25 LAB — BASIC METABOLIC PANEL
Anion gap: 11 (ref 5–15)
BUN: 34 mg/dL — ABNORMAL HIGH (ref 8–23)
CO2: 21 mmol/L — ABNORMAL LOW (ref 22–32)
Calcium: 8.2 mg/dL — ABNORMAL LOW (ref 8.9–10.3)
Chloride: 104 mmol/L (ref 98–111)
Creatinine, Ser: 1.34 mg/dL — ABNORMAL HIGH (ref 0.61–1.24)
GFR, Estimated: 55 mL/min — ABNORMAL LOW (ref >60)
Glucose, Bld: 112 mg/dL — ABNORMAL HIGH (ref 70–99)
Potassium: 3.8 mmol/L (ref 3.5–5.1)
Sodium: 136 mmol/L (ref 135–145)

## 2021-12-25 LAB — GLUCOSE, CAPILLARY
Glucose-Capillary: 98 mg/dL (ref 70–99)
Glucose-Capillary: 162 mg/dL — ABNORMAL HIGH (ref 70–99)
Glucose-Capillary: 232 mg/dL — ABNORMAL HIGH (ref 70–99)
Glucose-Capillary: 281 mg/dL — ABNORMAL HIGH (ref 70–99)

## 2021-12-25 LAB — CBC
HCT: 40.8 % (ref 39.0–52.0)
Hemoglobin: 13.4 g/dL (ref 13.0–17.0)
MCH: 30.7 pg (ref 26.0–34.0)
MCHC: 32.8 g/dL (ref 30.0–36.0)
MCV: 93.4 fL (ref 80.0–100.0)
Platelets: 189 10*3/uL (ref 150–400)
RBC: 4.37 MIL/uL (ref 4.22–5.81)
RDW: 13.9 % (ref 11.5–15.5)
WBC: 9 10*3/uL (ref 4.0–10.5)
nRBC: 0 % (ref 0.0–0.2)

## 2021-12-25 NOTE — Progress Notes (Signed)
Pts 4th walk today   PT walked 248ft in the hallway   Front wheel walker used with a gaitbelt  Had wife assist this time in the walk  Pt tolerated well

## 2021-12-25 NOTE — Progress Notes (Addendum)
[address]       [place] [address]             [phone]      5 Days Post-Op Procedure(s) (LRB): CORONARY ARTERY BYPASS GRAFTING (CABG) TIMES THREE, USING LEFT INTERNAL MAMMARY ARTERY AND ENDOSCOPICALLY HARVESTED RIGHT GREATER SAPHENOUS VEIN (N/A) TRANSESOPHAGEAL ECHOCARDIOGRAM (TEE) (N/A) ENDOVEIN HARVEST OF GREATER SAPHENOUS VEIN (Right) Subjective: Had a better day yesterday, walked in the hall several times but is still weak.  Confusion is improving.   Objective: Vital signs in last 24 hours: Temp:  [97.6 F (36.4 C)-98.4 F (36.9 C)] 98 F (36.7 C) (02/12 0405) Pulse Rate:  [67-91] 72 (02/12 0407) Cardiac Rhythm: Atrial flutter (02/12 0506) Resp:  [20-25] 20 (02/12 0407) BP: (116-153)/(64-82) 153/73 (02/12 0405) SpO2:  [96 %-99 %] 96 % (02/12 0407) Weight:  [105.4 kg] 105.4 kg (02/12 0407)   Intake/Output from previous day: 02/11 0701 - 02/12 0700 In: 10 [I.V.:10] Out: 1040 [Urine:1040] Intake/Output this shift: No intake/output data recorded.  General appearance: alert, cooperative, oriented Neurologic: no focal m/s deficits Heart: SR over night and this morning. Few PAC's Lungs: breath sounds are clear, O2 sats OK on RA.  Abdomen: soft, non-tender Extremities: trace LE edema Wound: The sternotomy incision and RLE EVH incision are intact and dry. Expected bruising right thigh.  Lab Results: Recent Labs    12/23/21 0202 12/25/21 0159  WBC 11.3* 9.0  HGB 13.0 13.4  HCT 38.8* 40.8  PLT 125* 189    BMET:  Recent Labs    12/23/21 0202 12/25/21 0159  NA 137 136  K 3.9 3.8  CL 105 104  CO2 22 21*  GLUCOSE 180* 112*  BUN 35* 34*  CREATININE 1.59* 1.34*  CALCIUM 8.5* 8.2*     PT/INR: No results for input(s): LABPROT, INR in the last 72 hours. ABG    Component Value Date/Time   PHART 7.305 (L) 12/20/2021 1902   HCO3 18.2 (L) 12/20/2021 1902   TCO2 19 (L) 12/20/2021 1902   ACIDBASEDEF 7.0 (H) 12/20/2021 1902   O2SAT  94.0 12/20/2021 1902   CBG (last 3)  Recent Labs    12/24/21 1557 12/24/21 2131 12/25/21 0634  GLUCAP 160* 184* 98     Assessment/Plan: S/P Procedure(s) (LRB): CORONARY ARTERY BYPASS GRAFTING (CABG) TIMES THREE, USING LEFT INTERNAL MAMMARY ARTERY AND ENDOSCOPICALLY HARVESTED RIGHT GREATER SAPHENOUS VEIN (N/A) TRANSESOPHAGEAL ECHOCARDIOGRAM (TEE) (N/A) ENDOVEIN HARVEST OF GREATER SAPHENOUS VEIN (Right)  -POD5 CABG x 3 after presenting with unstable angina and normal EF. Stable VS. Had peri-operative VF and was started on amiodarone. Had paroxysmal atrial fibrillation a few days post-op but stable SR since last evening. Continue amiodarone load  along with the metoprolol, ASA, and Crestor.  The pacer wires are out.   -Type 2 DM- Has an insulin pump but it is not active. Glucose controled with Levemir 24u BID, Novalog 5u cc,  and SSI.  Will need basal insulin at discharge.   -Expected acute blood loss anemia-Hct has normalized.   -Acute renal insufficiency-Baseline Creat 1.1--> peaked at 1.76--> now trending back down.   -Confusion- continues to improving. D/C'd oxycodone, now only using Tylenol for discomfort.   -Volume excess- Wt trending down, now 2Lbs above pre-op. Continue oral Laisx  -PULM- small to moderate left pleural effusion. Not dyspneic on RA. Diuresing.   -DVT PPX- on daily enoxaparin.   -Disposition- confusion clearing, still weak with ambulation but improving. Needs another day for  ambulation and to monitor rhythm.    LOS: 5 days    Malon Kindle [phone] 12/25/2021   Chart reviewed, patient examined, agree with above. He continues to improve. Possibly home tomorrow.

## 2021-12-25 NOTE — Progress Notes (Addendum)
PT walked 464ft in the hallway  Front wheel walker used with a gaitbelt  Pt tolerated well

## 2021-12-25 NOTE — Progress Notes (Signed)
Mobility Specialist Progress Note:   12/25/21 1100  Mobility  Activity Ambulated with assistance in hallway  Level of Assistance Contact guard assist, steadying assist  Assistive Device Front wheel walker  Distance Ambulated (ft) 350 ft  Activity Response Tolerated well  $Mobility charge 1 Mobility   Pt agreeable to mobility session this am. Required verbal cues to maintain sternal precautions, and min guard throughout session. Distance limited secondary to fatigue, pt requiring multiple standing rest breaks d/t SOB. SpO2 95% throughout session.   Nelta Numbers Mobility Specialist  Phone: [phone]

## 2021-12-25 NOTE — Progress Notes (Signed)
Pts 3rd walk today  PT walked 475ft in the hallway   Front wheel walker used with a gaitbelt   Pt tolerated well

## 2021-12-26 DIAGNOSIS — I480 Paroxysmal atrial fibrillation: Secondary | ICD-10-CM

## 2021-12-26 LAB — GLUCOSE, CAPILLARY
Glucose-Capillary: 236 mg/dL — ABNORMAL HIGH (ref 70–99)
Glucose-Capillary: 252 mg/dL — ABNORMAL HIGH (ref 70–99)
Glucose-Capillary: 238 mg/dL — ABNORMAL HIGH (ref 70–99)
Glucose-Capillary: 152 mg/dL — ABNORMAL HIGH (ref 70–99)

## 2021-12-26 MED ORDER — METOPROLOL TARTRATE 50 MG PO TABS
50.0000 mg | ORAL_TABLET | Freq: Two times a day (BID) | ORAL | Status: DC
  Administered 2021-12-26 – 2021-12-27 (×3): 50 mg via ORAL
  Filled 2021-12-26 (×3): qty 1

## 2021-12-26 MED ORDER — ACETAMINOPHEN 325 MG PO TABS
650.0000 mg | ORAL_TABLET | Freq: Four times a day (QID) | ORAL | Status: DC | PRN
  Administered 2021-12-27: 650 mg via ORAL
  Filled 2021-12-26: qty 2

## 2021-12-26 MED ORDER — METFORMIN HCL 850 MG PO TABS
850.0000 mg | ORAL_TABLET | Freq: Every day | ORAL | Status: DC
  Administered 2021-12-26 – 2021-12-27 (×2): 850 mg via ORAL
  Filled 2021-12-26 (×2): qty 1

## 2021-12-26 NOTE — Consult Note (Addendum)
Cardiology Consultation:   Patient ID: Jason Giles MRN: [id number]; DOB: 1946/05/27  Admit date: 12/20/2021 Date of Consult: 12/26/2021  PCP:  Jason Pao, MD   [facility] Providers Cardiologist:  Jason Moores, MD   {  Patient Profile:   Jason Giles is a 76 y.o. male with a hx of CAD s/p remote stenting, hypertension and hyperlipidemia who is being seen 12/26/2021 for the evaluation of post CABG atrial fibrillation at the request of Jason Giles.  Recently seen by Jason Giles for chest pain concerning of angina.  Follow-up coronary CTA was abnormal. Follow up cath 12/02/21 showed Multivessel disease consisting of left main, LAD, collateralized chronically occluded left circumflex, and high-grade right coronary disease.  History of Present Illness:   Jason Giles was seen by surgical team and underwent outpatient CABG X 3 on 12/20/2021 (LIMA to LAD; Reverse saphenous vein graft obtuse marginal, reverse saphenous vein graft to the posterior lateral branch).  Perioperative course complicated by runs of atrial fibrillation/V. tach.  Patient was given IV amiodarone with resolution.  Also seen by critical care team.  Patient was switched to loading dose of p.o. amiodarone 400 mg twice daily on 12/22/21.  Since then patient has maintained sinus rhythm mostly.  Intermittent brief atrial fibrillation episode, initially with elevated heart rate, now at controlled ventricular rate.  Last episode overnight with brief bradycardia to 40s while sleeping.  Currently maintaining sinus rhythm.  Cardiology is asked for further evaluation.  Patient denies chest pain or shortness of breath.  Postsurgery creatinine peaked at 1.76, now improving.  Serum creatinine of 1.34 today.   Past Medical History:  Diagnosis Date   Arthritis    Colon polyp    adenomatous   Coronary artery disease    post PTCA and stenting   CORONARY ATHEROSCLEROSIS NATIVE CORONARY ARTERY 12/21/2009   Diabetes mellitus     Diverticulosis    History of kidney stones    Hyperlipidemia    Hypertension     Past Surgical History:  Procedure Laterality Date   CORONARY ANGIOPLASTY WITH STENT PLACEMENT     left circumflex artery   CORONARY ARTERY BYPASS GRAFT N/A 12/20/2021   Procedure: CORONARY ARTERY BYPASS GRAFTING (CABG) TIMES THREE, USING LEFT INTERNAL MAMMARY ARTERY AND ENDOSCOPICALLY HARVESTED RIGHT GREATER SAPHENOUS VEIN;  Surgeon: Lajuana Matte, MD;  Location: [facility];  Service: Open Heart Surgery;  Laterality: N/A;   ENDOVEIN HARVEST OF GREATER SAPHENOUS VEIN Right 12/20/2021   Procedure: ENDOVEIN HARVEST OF GREATER SAPHENOUS VEIN;  Surgeon: Lajuana Matte, MD;  Location: [place];  Service: Open Heart Surgery;  Laterality: Right;   LEFT HEART CATH AND CORONARY ANGIOGRAPHY N/A 12/02/2021   Procedure: LEFT HEART CATH AND CORONARY ANGIOGRAPHY;  Surgeon: Early Osmond, MD;  Location: [place] [facility];  Service: Cardiovascular;  Laterality: N/A;   TEE WITHOUT CARDIOVERSION N/A 12/20/2021   Procedure: TRANSESOPHAGEAL ECHOCARDIOGRAM (TEE);  Surgeon: Lajuana Matte, MD;  Location: [place];  Service: Open Heart Surgery;  Laterality: N/A;    Inpatient Medications: Scheduled Meds:  amiodarone  400 mg Oral BID   aspirin EC  325 mg Oral Daily   Or   aspirin  324 mg Per Tube Daily   bisacodyl  10 mg Oral Daily   Or   bisacodyl  10 mg Rectal Daily   Chlorhexidine Gluconate Cloth  6 each Topical Daily   docusate sodium  200 mg Oral Daily   enoxaparin (LOVENOX) injection  40 mg Subcutaneous  QHS   fenofibrate  160 mg Oral Daily   furosemide  40 mg Oral Daily   insulin aspart  0-15 Units Subcutaneous TID WC   insulin aspart  5 Units Subcutaneous TID WC   insulin detemir  24 Units Subcutaneous BID   mouth rinse  15 mL Mouth Rinse BID   metFORMIN  850 mg Oral Q breakfast   metoprolol tartrate  50 mg Oral BID   multivitamin with minerals  1 tablet Oral Daily   pantoprazole  40 mg Oral Daily   potassium  chloride  40 mEq Oral Daily   rosuvastatin  40 mg Oral Daily   sodium chloride flush  10-40 mL Intracatheter Q12H   Continuous Infusions:  PRN Meds: acetaminophen, metoprolol tartrate, ondansetron (ZOFRAN) IV, sodium chloride flush, traMADol  Allergies:   No Known Allergies  Social History:   Social History   Socioeconomic History   Marital status: Married    Spouse name: Not on file   Number of children: 4   Years of education: Not on file   Highest education level: Not on file  Occupational History   Occupation: Retired  Tobacco Use   Smoking status: Former    Types: Cigarettes    Quit date: 11/13/1985    Years since quitting: 36.1   Smokeless tobacco: Never  Vaping Use   Vaping Use: Never used  Substance and Sexual Activity   Alcohol use: Yes    Alcohol/week: 0.0 standard drinks    Comment: Occassionally   Drug use: No   Sexual activity: Not on file  Other Topics Concern   Not on file  Social History Narrative   Not on file   Social Determinants of Health   Financial Resource Strain: Not on file  Food Insecurity: Not on file  Transportation Needs: Not on file  Physical Activity: Not on file  Stress: Not on file  Social Connections: Not on file  Intimate Partner Violence: Not on file    Family History:   Family History  Problem Relation Age of Onset   Cancer Father        Brain Tumor   Colon cancer Mother    Colon polyps Neg Hx    Kidney disease Neg Hx    Diabetes Neg Hx    Esophageal cancer Neg Hx      ROS:  Please see the history of present illness.  All other ROS reviewed and negative.     Physical Exam/Data:   Vitals:   12/25/21 2235 12/26/21 0320 12/26/21 0416 12/26/21 0750  BP: 126/63 130/67  125/69  Pulse: 77 78  75  Resp: 20 20  20   Temp: 97.9 F (36.6 C) 98 F (36.7 C)  98.1 F (36.7 C)  TempSrc: Oral Oral  Oral  SpO2: 96% 97%  95%  Weight:   104.8 kg   Height:        Intake/Output Summary (Last 24 hours) at 12/26/2021  1049 Last data filed at 12/26/2021 0817 Gross per 24 hour  Intake 360 ml  Output 650 ml  Net -290 ml   Last 3 Weights 12/26/2021 12/25/2021 12/24/2021  Weight (lbs) 231 lb 1.6 oz 232 lb 6.4 oz 234 lb 1.6 oz  Weight (kg) 104.826 kg 105.416 kg 106.187 kg     Body mass index is 32.23 kg/m.  General:  Well nourished, well developed, in no acute distress HEENT: normal Neck: no JVD Vascular: No carotid bruits; Distal pulses 2+ bilaterally Cardiac:  normal  S1, S2; RRR; no murmur.  Surgical scar healing well Lungs:  clear to auscultation bilaterally, no wheezing, rhonchi or rales  Abd: soft, nontender, no hepatomegaly  Ext: no edema Musculoskeletal:  No deformities, BUE and BLE strength normal and equal Skin: warm and dry  Neuro:  CNs 2-12 intact, no focal abnormalities noted Psych:  Normal affect   EKG:  The EKG was personally reviewed and demonstrates:  Sinus rhythm Telemetry:  Telemetry was personally reviewed and demonstrates: Sinus rhythm at controlled ventricular rate of 60s, intermittent atrial fibrillation, brief bradycardia in 40s overnight with PAC  Relevant CV Studies:  Echo 12/08/2021  1. Left ventricular ejection fraction, by estimation, is 60 to 65%. The  left ventricle has normal function. The left ventricle has no regional  wall motion abnormalities. There is moderate-to-severe hypertrophy of the  basal septum. The rest of the LV  segments demonstrate mild left ventricular hypertrophy. Left ventricular  diastolic parameters are consistent with Grade I diastolic dysfunction  (impaired relaxation).   2. Right ventricular systolic function is normal. The right ventricular  size is normal. Tricuspid regurgitation signal is inadequate for assessing  PA pressure.   3. The mitral valve is normal in structure. Mild mitral valve  regurgitation.   4. The aortic valve is tricuspid. There is mild calcification of the  aortic valve. There is mild thickening of the aortic valve.  Aortic valve  regurgitation is not visualized. Aortic valve sclerosis/calcification is  present, without any evidence of  aortic stenosis.   5. Aortic dilatation noted. 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   Comparison(s): Compared to prior TTE in 06/2019, there is no significant  change.   LEFT HEART CATH AND CORONARY ANGIOGRAPHY  12/02/21   Conclusion      Prox Cx lesion is 100% stenosed.   Prox LAD lesion is 50% stenosed.   Mid LM lesion is 50% stenosed.   Dist LAD lesion is 30% stenosed.   Mid RCA lesion is 80% stenosed.   LV end diastolic pressure is normal.   The left ventricular ejection fraction is 55-65% by visual estimate.   1.  Multivessel disease consisting of left main, LAD, collateralized chronically occluded left circumflex, and high-grade right coronary disease. 2.  Preserved ejection fraction with LVEDP of 15 mmHg.   The patient will be referred for outpatient cardiothoracic surgical evaluation  Diagnostic Dominance: Right  Laboratory Data:   Chemistry Recent Labs  Lab 12/20/21 1755 12/20/21 1828 12/21/21 0434 12/21/21 1804 12/22/21 0713 12/23/21 0202 12/25/21 0159  NA 140   < > 140 136 136 137 136  K 4.6   < > 4.3 4.3 4.0 3.9 3.8  CL 113*  --  111 104 102 105 104  CO2 20*  --  20* 18* 20* 22 21*  GLUCOSE 135*  --  150* 216* 266* 180* 112*  BUN 17  --  19 22 27* 35* 34*  CREATININE 1.30*  --  1.59* 1.76* 1.75* 1.59* 1.34*  CALCIUM 8.0*  --  8.3* 8.3* 8.3* 8.5* 8.2*  MG 3.3*  --  2.7* 2.4  --   --   --   GFRNONAA 57*  --  45* 40* 40* 45* 55*  ANIONGAP 7  --  9 14 14 10 11    < > = values in this interval not displayed.    Lipids  Recent Labs  Lab 12/21/21 0434  CHOL 56  TRIG 60  HDL 25*  LDLCALC 19  CHOLHDL 2.2    Hematology Recent Labs  Lab 12/22/21 0713 12/23/21 0202 12/25/21 0159  WBC 12.7* 11.3* 9.0  RBC 4.14* 4.18* 4.37  HGB 12.6* 13.0 13.4  HCT 39.9 38.8* 40.8  MCV 96.4 92.8 93.4  MCH  30.4 31.1 30.7  MCHC 31.6 33.5 32.8  RDW 14.1 13.9 13.9  PLT 115* 125* 189   Radiology/Studies:  DG Chest 2 View  Result Date: 12/25/2021 CLINICAL DATA:  Open heart surgery with postoperative check. EXAM: CHEST - 2 VIEW COMPARISON:  12/22/2021 FINDINGS: PA Lat study, 5:53 a.m., 12/25/2021. Interval removal left chest tube, right IJ double-lumen catheter. Small or moderate sized layering left pleural effusion and overlying atelectasis or consolidation persist. The lungs are otherwise clear. There are CABG changes and aortic atherosclerosis with stable mediastinal configuration. There is a mildly prominent heart silhouette but no vascular congestion is seen. The right sulci are sharp. IMPRESSION: Grossly stable overall aeration with left pleural effusion and overlying atelectasis or consolidation. Mild cardiomegaly with normal caliber central vessels. Electronically Signed   By: Telford Nab M.D.   On: 12/25/2021 07:56    Assessment and Plan:   New onset atrial fibrillation -Patient had atrial fibrillation during surgery requiring IV amiodarone with resolution.  Now getting loading dose of p.o. amiodarone 400 mg twice daily since 2/9.  At most part, rhythm is sinus at controlled ventricular rate of 60s.  Intermittent rate control atrial fibrillation episode, most recent at overnight.  Had brief bradycardia while sleeping.  Asymptomatic. -Continue Amiodarone 400mg  BID for 2 more than than 200mg  BID x 7 and then 200mg  daily.  Hopefully this will resolve with surgical recovery.  Will need to monitor to determine burden prior to stopping amiodarone. Will avoid anticoagulation as his afib related to surgery.  - Echo 1/26 with normal LVEF. Normal LA size.   2. CAD s/p remote stenting - Underwent CABG x 3 12/20/21 -No chest pain.  Recovering well. -Continue aspirin 324 mg daily -Continue high intensity statin -We will avoid beta-blocker while on amiodarone  3.  AKI -Improving  4. DM - On metformin     Risk Assessment/Risk Scores:   CHA2DS2-VASc Score = 5  This indicates a 7.2% annual risk of stroke. The patient's score is based upon: CHF History: 0 HTN History: 1 Diabetes History: 1 Stroke History: 0 Vascular Disease History: 1 Age Score: 2 Gender Score: 0    For questions or updates, please contact [facility] Please consult [email or web address] for contact info under    Patient seen and examined, note reviewed with the signed Advanced Practice Provider. I personally reviewed laboratory data, imaging studies and relevant notes. I independently examined the patient and formulated the important aspects of the plan. I have personally discussed the plan with the patient and/or family. Comments or changes to the note/plan are indicated below.  I was able to speak to the patient and his wife in great detail about the above plan.  All of her questions has been answered.   Berniece Salines DO, MS [facility] Attending Cardiologist [place]  [address] [place], [place] [address] [phone] Website: [email or web address]    Mahalia Longest [place], [place]  12/26/2021 10:49 AM

## 2021-12-26 NOTE — TOC Initial Note (Signed)
Transition of Care (TOC) - Initial/Assessment Note  Jason Gibbons RN, BSN Transitions of Care Unit 4E- RN Case Manager See Treatment Team for direct phone #    Patient Details  Name: Jason Giles MRN: [id number] Date of Birth: 05/18/1946  Transition of Care [facility]) CM/SW Contact:    Jason Patricia, RN Phone Number: 12/26/2021, 4:55 PM  Clinical Narrative:                 Pt s/p CABG, from home w/ wife. Order placed for HHPT. CM in to speak with pt and wife at bedside.  Discussed [place] services and list provided for [facility] choice. Pt and wife would like time to review list- CM to f/u in am for choice and referral needs. Wife also voice that their [employer] has new benefit this year for personal care services post discharge- will also discuss this more on return tomorrow.   Per wife- she has borrowed a RW for patient to have at home, also have a cane. No other DME needs noted.   CM to follow.   Expected Discharge Plan: [place] Barriers to Discharge: Continued Medical Work up   Patient Goals and CMS Choice Patient states their goals for this hospitalization and ongoing recovery are:: return home w/ wife CMS Medicare.gov Compare Post Acute Care list provided to:: Patient Choice offered to / list presented to : Patient, Spouse  Expected Discharge Plan and Services Expected Discharge Plan: [place]   Discharge Planning Services: CM Consult Post Acute Care Choice: Home Health, Durable Medical Equipment Living arrangements for the past 2 months: Single Family Home                           HH Arranged: PT          Prior Living Arrangements/Services Living arrangements for the past 2 months: Single Family Home Lives with:: Spouse Patient language and need for interpreter reviewed:: Yes Do you feel safe going back to the place where you live?: Yes      Need for Family Participation in Patient Care: Yes (Comment) Care giver  support system in place?: Yes (comment) Current home services: DME (cane) Criminal Activity/Legal Involvement Pertinent to Current Situation/Hospitalization: No - Comment as needed  Activities of Daily Living      Permission Sought/Granted Permission sought to share information with : Facility Art therapist granted to share information with : Yes, Verbal Permission Granted     Permission granted to share info w AGENCY: HH        Emotional Assessment Appearance:: Appears stated age Attitude/Demeanor/Rapport: Engaged Affect (typically observed): Accepting, Appropriate, Pleasant Orientation: : Oriented to Self, Oriented to Place, Oriented to  Time, Oriented to Situation Alcohol / Substance Use: Not Applicable Psych Involvement: No (comment)  Admission diagnosis:  Coronary artery disease [I25.10] Patient Active Problem List   Diagnosis Date Noted   S/P CABG x 3 12/20/2021   Coronary artery disease 12/20/2021   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

## 2021-12-26 NOTE — Care Management Important Message (Signed)
Important Message  Patient Details  Name: Jason Giles MRN: [id number] Date of Birth: Mar 16, 1946   Medicare Important Message Given:  Yes     Shelda Altes 12/26/2021, 9:10 AM

## 2021-12-26 NOTE — Progress Notes (Signed)
Physical Therapy Treatment Patient Details Name: Jason Giles MRN: [id number] DOB: Dec 09, 1945 Today's Date: 12/26/2021   History of Present Illness Patient is a 76 y/o male who presents on 12/20/21 s/p CABGx3. PMH includes HTN, CAD, DM.    PT Comments    Patient progressing well towards PT goals. Cognition is improving per wife but not quite at 100%. Session focused on functional transfers/standing while adhering to sternal precautions and stair training with wife present for family training. Requires Min-mod A to stand from low chair with max step by step cues for technique. Pt plans to sleep in recliner at home instead of bed initially. VSS on RA with mild SOB with activity especially post stairs. Will continue to follow to work on transfers.    Recommendations for follow up therapy are one component of a multi-disciplinary discharge planning process, led by the attending physician.  Recommendations may be updated based on patient status, additional functional criteria and insurance authorization.  Follow Up Recommendations  Home health PT     Assistance Recommended at Discharge Frequent or constant Supervision/Assistance  Patient can return home with the following A little help with walking and/or transfers;A little help with bathing/dressing/bathroom;Assistance with cooking/housework;Assist for transportation;Help with stairs or ramp for entrance   Equipment Recommendations  None recommended by PT    Recommendations for Other Services       Precautions / Restrictions Precautions Precautions: Sternal Precaution Booklet Issued: Yes (comment) Precaution Comments: Reviewed "move in the tube" and sternal precautions Restrictions Weight Bearing Restrictions: Yes Other Position/Activity Restrictions: sternal precautions     Mobility  Bed Mobility               General bed mobility comments: Up in chair upon PT arrival.    Transfers Overall transfer level: Needs  assistance Equipment used: Rolling walker (2 wheels) Transfers: Sit to/from Stand Sit to Stand: Mod assist, Min assist           General transfer comment: Not able to stand without assist on 4 attempts, needs max step by step cues for proper positioning, scooting forward, feet under hips and anterior weight shift/forward momentum, stood from chair x6 with emphasis on forward and controlled descent. Does better holding UEs on lap and not holding heart pillow resulting in retropulsion.    Ambulation/Gait Ambulation/Gait assistance: Min guard Gait Distance (Feet): 200 Feet Assistive device: Rolling walker (2 wheels) Gait Pattern/deviations: Step-through pattern, Decreased stride length Gait velocity: decreased Gait velocity interpretation: <1.31 ft/sec, indicative of household ambulator   General Gait Details: SLow, mostly steady gait with RW, cues for upright.   Stairs Stairs: Yes Stairs assistance: Min assist Stair Management: One rail Left, Step to pattern Number of Stairs: 3 General stair comments: Cues for technique/safety, wife on right side for support as pt only with 1 rail to enter home.   Wheelchair Mobility    Modified Rankin (Stroke Patients Only)       Balance Overall balance assessment: Needs assistance, History of Falls Sitting-balance support: Feet supported, No upper extremity supported Sitting balance-Leahy Scale: Good     Standing balance support: During functional activity Standing balance-Leahy Scale: Fair Standing balance comment: Able to stand staticaly without UE support, but needs UE support for dynamic tasks/walking.                            Cognition Arousal/Alertness: Awake/alert Behavior During Therapy: WFL for tasks assessed/performed Overall Cognitive Status: Impaired/Different from baseline  Area of Impairment: Problem solving, Memory                     Memory: Decreased short-term memory       Problem  Solving: Slow processing, Requires verbal cues General Comments: Reports cognition is better but not quite 100%, requires repetition to follow multi step commands, confused that people keep telling him to do things different ways.        Exercises      General Comments General comments (skin integrity, edema, etc.): Wife present. Decided he will be sleeping in recliner at home initially so  declined working on bed mobility.      Pertinent Vitals/Pain Pain Assessment Pain Assessment: Faces Faces Pain Scale: Hurts a little bit Pain Location: chest with coughing Pain Descriptors / Indicators: Sore Pain Intervention(s): Monitored during session, Repositioned    Home Living                          Prior Function            PT Goals (current goals can now be found in the care plan section) Progress towards PT goals: Progressing toward goals    Frequency    Min 3X/week      PT Plan Current plan remains appropriate    Co-evaluation              AM-PAC PT "6 Clicks" Mobility   Outcome Measure  Help needed turning from your back to your side while in a flat bed without using bedrails?: A Little Help needed moving from lying on your back to sitting on the side of a flat bed without using bedrails?: A Lot Help needed moving to and from a bed to a chair (including a wheelchair)?: A Lot Help needed standing up from a chair using your arms (e.g., wheelchair or bedside chair)?: A Lot Help needed to walk in hospital room?: A Little Help needed climbing 3-5 steps with a railing? : A Little 6 Click Score: 15    End of Session Equipment Utilized During Treatment: Gait belt Activity Tolerance: Patient tolerated treatment well Patient left: in chair;with call bell/phone within reach;with family/visitor present Nurse Communication: Mobility status PT Visit Diagnosis: Muscle weakness (generalized) (M62.81);Other abnormalities of gait and mobility (R26.89)      Time: 5170-0174 PT Time Calculation (min) (ACUTE ONLY): 26 min  Charges:  $Gait Training: 8-22 mins $Therapeutic Activity: 8-22 mins                     Marisa Severin, PT, DPT Acute Rehabilitation Services Pager [phone] Office [place] 12/26/2021, 11:03 AM

## 2021-12-26 NOTE — Progress Notes (Addendum)
° °   °  [address]       [place] [address]             [phone]       6 Days Post-Op Procedure(s) (LRB): CORONARY ARTERY BYPASS GRAFTING (CABG) TIMES THREE, USING LEFT INTERNAL MAMMARY ARTERY AND ENDOSCOPICALLY HARVESTED RIGHT GREATER SAPHENOUS VEIN (N/A) TRANSESOPHAGEAL ECHOCARDIOGRAM (TEE) (N/A) ENDOVEIN HARVEST OF GREATER SAPHENOUS VEIN (Right)  Subjective:  No new complaints.  Overall he feels pretty good.  He was able to move his bowels yesterday.  Objective: Vital signs in last 24 hours: Temp:  [97.9 F (36.6 C)-98.4 F (36.9 C)] 98 F (36.7 C) (02/13 0320) Pulse Rate:  [67-79] 78 (02/13 0320) Cardiac Rhythm: Atrial flutter (02/12 1952) Resp:  [20-21] 20 (02/13 0320) BP: (122-133)/(63-68) 130/67 (02/13 0320) SpO2:  [93 %-97 %] 97 % (02/13 0320) Weight:  [104.8 kg] 104.8 kg (02/13 0416)   Intake/Output from previous day: 02/12 0701 - 02/13 0700 In: 120 [P.O.:120] Out: 650 [Urine:650]  General appearance: alert, cooperative, and no distress Heart: regular rate and rhythm Lungs: clear to auscultation bilaterally Abdomen: soft, non-tender; bowel sounds normal; no masses,  no organomegaly Extremities: edema trace Wound: clean and dry  Lab Results: Recent Labs    12/25/21 0159  WBC 9.0  HGB 13.4  HCT 40.8  PLT 189   BMET:  Recent Labs    12/25/21 0159  NA 136  K 3.8  CL 104  CO2 21*  GLUCOSE 112*  BUN 34*  CREATININE 1.34*  CALCIUM 8.2*    PT/INR: No results for input(s): LABPROT, INR in the last 72 hours. ABG    Component Value Date/Time   PHART 7.305 (L) 12/20/2021 1902   HCO3 18.2 (L) 12/20/2021 1902   TCO2 19 (L) 12/20/2021 1902   ACIDBASEDEF 7.0 (H) 12/20/2021 1902   O2SAT 94.0 12/20/2021 1902   CBG (last 3)  Recent Labs    12/25/21 1636 12/25/21 2055 12/26/21 0635  GLUCAP 232* 281* 236*    Assessment/Plan: S/P Procedure(s) (LRB): CORONARY ARTERY BYPASS GRAFTING (CABG) TIMES THREE, USING LEFT INTERNAL  MAMMARY ARTERY AND ENDOSCOPICALLY HARVESTED RIGHT GREATER SAPHENOUS VEIN (N/A) TRANSESOPHAGEAL ECHOCARDIOGRAM (TEE) (N/A) ENDOVEIN HARVEST OF GREATER SAPHENOUS VEIN (Right)  CV- A. Flutter overnight, currently in NSR- on Amiodarone, Lopressor Pulm- off oxygen, no acute issues, Renal- creatinine is trending down, currently at 1.34, weight is trending down, continue lasix, potassium DM- sugars elevated at times, will restart home Metformin Dispo- patient stable, episodes of A. Flutter overnight, currently in NSR.. may benefit from ZIO patch at discharge to assess for tachy/brady syndrome, continue diuretics, resume metformin for additional blood glucose control, home health orders placed   LOS: 6 days    Ellwood Handler, PA-C 12/26/2021  Agree with above Intermittent afib/flutter, on amio Cards on board [place]

## 2021-12-26 NOTE — Progress Notes (Signed)
CARDIAC REHAB PHASE I   PRE:  Rate/Rhythm: 66 SR  MODE:  Ambulation: 300 ft   POST:  Rate/Rhythm: 74 SR  BP:  Sitting: 151/65    SaO2: 95 RA   Pt getting cleaned up at sink, agreeable to ambulation. Pt assist of one to stand and ambulated 356ft in hallway standby assist with front wheel walker. Pt returned to recliner, wife at bedside. Pt denies further questions or concerns at this time. Will continue to follow.  [phone] Rufina Falco, RN BSN 12/26/2021 3:01 PM

## 2021-12-27 DIAGNOSIS — I4891 Unspecified atrial fibrillation: Secondary | ICD-10-CM

## 2021-12-27 DIAGNOSIS — I2510 Atherosclerotic heart disease of native coronary artery without angina pectoris: Principal | ICD-10-CM

## 2021-12-27 LAB — GLUCOSE, CAPILLARY: Glucose-Capillary: 125 mg/dL — ABNORMAL HIGH (ref 70–99)

## 2021-12-27 MED ORDER — TRAMADOL HCL 50 MG PO TABS: 50.0000 mg | ORAL_TABLET | ORAL | 0 refills | Status: DC | PRN

## 2021-12-27 MED ORDER — AMIODARONE HCL 200 MG PO TABS: 400.0000 mg | ORAL_TABLET | Freq: Two times a day (BID) | ORAL | 1 refills | Status: DC

## 2021-12-27 MED ORDER — ACETAMINOPHEN 325 MG PO TABS: 650.0000 mg | ORAL_TABLET | Freq: Four times a day (QID) | ORAL | Status: DC | PRN

## 2021-12-27 MED ORDER — METOPROLOL TARTRATE 50 MG PO TABS: 50.0000 mg | ORAL_TABLET | Freq: Two times a day (BID) | ORAL | 3 refills | Status: DC

## 2021-12-27 MED ORDER — FUROSEMIDE 40 MG PO TABS: 40.0000 mg | ORAL_TABLET | Freq: Every day | ORAL | 0 refills | Status: DC

## 2021-12-27 MED ORDER — BENZONATATE 100 MG PO CAPS: 100.0000 mg | ORAL_CAPSULE | Freq: Three times a day (TID) | ORAL | Status: DC | PRN

## 2021-12-27 MED ORDER — BENZONATATE 100 MG PO CAPS: 100.0000 mg | ORAL_CAPSULE | Freq: Three times a day (TID) | ORAL | 0 refills | Status: DC | PRN

## 2021-12-27 MED ORDER — POTASSIUM CHLORIDE CRYS ER 20 MEQ PO TBCR: 20.0000 meq | EXTENDED_RELEASE_TABLET | Freq: Every day | ORAL | 0 refills | Status: DC

## 2021-12-27 NOTE — Progress Notes (Signed)
° °   °  [address]       [place] [address]             [phone]       7 Days Post-Op Procedure(s) (LRB): CORONARY ARTERY BYPASS GRAFTING (CABG) TIMES THREE, USING LEFT INTERNAL MAMMARY ARTERY AND ENDOSCOPICALLY HARVESTED RIGHT GREATER SAPHENOUS VEIN (N/A) TRANSESOPHAGEAL ECHOCARDIOGRAM (TEE) (N/A) ENDOVEIN HARVEST OF GREATER SAPHENOUS VEIN (Right)  Subjective:  No new complaints.  Continues to have issues with coughing at night.  + ambulation  + BM  Objective: Vital signs in last 24 hours: Temp:  [97.9 F (36.6 C)-98.5 F (36.9 C)] 98.2 F (36.8 C) (02/14 0348) Pulse Rate:  [62-75] 66 (02/14 0348) Cardiac Rhythm: Normal sinus rhythm (02/13 1940) Resp:  [16-22] 20 (02/14 0348) BP: (114-136)/(54-69) 124/62 (02/14 0348) SpO2:  [93 %-96 %] 93 % (02/14 0348) Weight:  [105.6 kg] 105.6 kg (02/14 0612)  Intake/Output from previous day: 02/13 0701 - 02/14 0700 In: 720 [P.O.:720] Out: 1810 [Urine:1810]  General appearance: alert, cooperative, and no distress Heart: regular rate and rhythm Lungs: clear to auscultation bilaterally Abdomen: soft, non-tender; bowel sounds normal; no masses,  no organomegaly Extremities: edema trace Wound: clean and dry  Lab Results: Recent Labs    12/25/21 0159  WBC 9.0  HGB 13.4  HCT 40.8  PLT 189   BMET:  Recent Labs    12/25/21 0159  NA 136  K 3.8  CL 104  CO2 21*  GLUCOSE 112*  BUN 34*  CREATININE 1.34*  CALCIUM 8.2*    PT/INR: No results for input(s): LABPROT, INR in the last 72 hours. ABG    Component Value Date/Time   PHART 7.305 (L) 12/20/2021 1902   HCO3 18.2 (L) 12/20/2021 1902   TCO2 19 (L) 12/20/2021 1902   ACIDBASEDEF 7.0 (H) 12/20/2021 1902   O2SAT 94.0 12/20/2021 1902   CBG (last 3)  Recent Labs    12/26/21 1614 12/26/21 2107 12/27/21 0607  GLUCAP 238* 152* 125*    Assessment/Plan: S/P Procedure(s) (LRB): CORONARY ARTERY BYPASS GRAFTING (CABG) TIMES THREE, USING LEFT INTERNAL  MAMMARY ARTERY AND ENDOSCOPICALLY HARVESTED RIGHT GREATER SAPHENOUS VEIN (N/A) TRANSESOPHAGEAL ECHOCARDIOGRAM (TEE) (N/A) ENDOVEIN HARVEST OF GREATER SAPHENOUS VEIN (Right)  CV- NSR with 1st AV block, no further A. Fib or flutter- continue Amiodarone, Lopressor Pulm- no acute issues, off oxygen, continue IS Renal- weight is trending down to baseline, continue Lasix, potassium for now DM- sugar control somewhat improved, continue current regimen Deconditioning- mild home health arranged, family will finalize choice today Dispo- patient stable, maintained NSR for past 24 hours, continue Amiodarone, Lopressor, I think patient can go home today will discuss with Dr. Kipp Brood    LOS: 7 days   Ellwood Handler, PA-C 12/27/2021

## 2021-12-27 NOTE — TOC Transition Note (Signed)
Transition of Care (TOC) - CM/SW Discharge Note Marvetta Gibbons RN, BSN Transitions of Care Unit 4E- RN Case Manager See Treatment Team for direct phone #    Patient Details  Name: Jason Giles MRN: [id number] Date of Birth: 11-Jan-1946  Transition of Care [facility]) CM/SW Contact:  Dawayne Patricia, RN Phone Number: 12/27/2021, 10:21 AM   Clinical Narrative:    Pt stable for transition home today w/ wife. CM follow up done with pt and wife regarding HH choice. They have selected [facility] as first choice. Also discussed with them insurance benefit of home care and how it typically has worked with other plans. Encouraged wife to call [employer] to check and see what the benefit coverage is under patient's plan since it is new and find out the details and how the go about accessing the benefit. Wife states their son is coming to assist so she does not feel they will need the benefit this time but glad to know it is there if they need it in the future.   Call made to [facility] with [facility] for [place] referral- referral has been accepted and they will reach out to pt/wife to schedule start of care visit.    Final next level of care: [place] Barriers to Discharge: Barriers Resolved   Patient Goals and CMS Choice Patient states their goals for this hospitalization and ongoing recovery are:: return home w/ wife CMS Medicare.gov Compare Post Acute Care list provided to:: Patient Choice offered to / list presented to : Patient, Spouse  Discharge Placement               Home w/ [facility].         Discharge Plan and Services   Discharge Planning Services: CM Consult Post Acute Care Choice: Home Health, Durable Medical Equipment          DME Arranged: N/A DME Agency: NA       HH Arranged: PT HH Agency: [employer] Date [facility] Agency Contacted: 12/27/21 Time HH Agency Contacted: 1000 Representative spoke with at [place]: [place] ([place])  Interventions     Readmission Risk Interventions Readmission Risk Prevention Plan 12/27/2021  Transportation Screening Complete  PCP or Specialist Appt within 5-7 Days Complete  Home Care Screening Complete  Medication Review (RN CM) Complete  Some recent data might be hidden

## 2021-12-27 NOTE — Progress Notes (Signed)
CARDIAC REHAB PHASE I   D/c education completed with pt and wife. Pt educated on importance of site care and monitoring incisions daily. Encouraged continued IS use, walks, and sternal precautions. Reviewed restrictions and exercise guidelines. Will refer to CRP II [place] Rufina Falco, RN BSN 12/27/2021 8:54 AM

## 2021-12-27 NOTE — Progress Notes (Addendum)
Progress Note  Patient Name: Jason Giles Date of Encounter: 12/27/2021  [facility] Cardiologist: Mertie Moores, MD   Subjective   Feeling okay. No chest pain, sob or palpitations.    Inpatient Medications    Scheduled Meds:  amiodarone  400 mg Oral BID   aspirin EC  325 mg Oral Daily   Or   aspirin  324 mg Per Tube Daily   bisacodyl  10 mg Oral Daily   Or   bisacodyl  10 mg Rectal Daily   docusate sodium  200 mg Oral Daily   enoxaparin (LOVENOX) injection  40 mg Subcutaneous QHS   fenofibrate  160 mg Oral Daily   furosemide  40 mg Oral Daily   insulin aspart  0-15 Units Subcutaneous TID WC   insulin aspart  5 Units Subcutaneous TID WC   insulin detemir  24 Units Subcutaneous BID   mouth rinse  15 mL Mouth Rinse BID   metFORMIN  850 mg Oral Q breakfast   metoprolol tartrate  50 mg Oral BID   multivitamin with minerals  1 tablet Oral Daily   pantoprazole  40 mg Oral Daily   potassium chloride  40 mEq Oral Daily   rosuvastatin  40 mg Oral Daily   sodium chloride flush  10-40 mL Intracatheter Q12H   Continuous Infusions:  PRN Meds: acetaminophen, benzonatate, metoprolol tartrate, ondansetron (ZOFRAN) IV, sodium chloride flush, traMADol   Vital Signs    Vitals:   12/26/21 2306 12/27/21 0348 12/27/21 0612 12/27/21 0730  BP: 130/60 124/62  122/63  Pulse: 68 66  69  Resp: 16 20  20   Temp: 98.5 F (36.9 C) 98.2 F (36.8 C)  98.5 F (36.9 C)  TempSrc: Oral Oral  Axillary  SpO2: 95% 93%  96%  Weight:   105.6 kg   Height:        Intake/Output Summary (Last 24 hours) at 12/27/2021 0810 Last data filed at 12/27/2021 0612 Gross per 24 hour  Intake 720 ml  Output 1810 ml  Net -1090 ml   Last 3 Weights 12/27/2021 12/26/2021 12/25/2021  Weight (lbs) 232 lb 11.2 oz 231 lb 1.6 oz 232 lb 6.4 oz  Weight (kg) 105.552 kg 104.826 kg 105.416 kg      Telemetry    Sinus rhythm - Personally Reviewed  ECG    N/A  Physical Exam   GEN: No acute distress.    Neck: No JVD Cardiac: RRR, no murmurs, rubs, or gallops.  Respiratory: Clear to auscultation bilaterally. GI: Soft, nontender, non-distended  MS: Trace edema; No deformity. Neuro:  Nonfocal  Psych: Normal affect   Labs    High Sensitivity Troponin:  No results for input(s): TROPONINIHS in the last 720 hours.   Chemistry Recent Labs  Lab 12/20/21 1755 12/20/21 1828 12/21/21 0434 12/21/21 1804 12/22/21 0713 12/23/21 0202 12/25/21 0159  NA 140   < > 140 136 136 137 136  K 4.6   < > 4.3 4.3 4.0 3.9 3.8  CL 113*  --  111 104 102 105 104  CO2 20*  --  20* 18* 20* 22 21*  GLUCOSE 135*  --  150* 216* 266* 180* 112*  BUN 17  --  19 22 27* 35* 34*  CREATININE 1.30*  --  1.59* 1.76* 1.75* 1.59* 1.34*  CALCIUM 8.0*  --  8.3* 8.3* 8.3* 8.5* 8.2*  MG 3.3*  --  2.7* 2.4  --   --   --  GFRNONAA 57*  --  45* 40* 40* 45* 55*  ANIONGAP 7  --  9 14 14 10 11    < > = values in this interval not displayed.    Lipids  Recent Labs  Lab 12/21/21 0434  CHOL 56  TRIG 60  HDL 25*  LDLCALC 19  CHOLHDL 2.2    Hematology Recent Labs  Lab 12/22/21 0713 12/23/21 0202 12/25/21 0159  WBC 12.7* 11.3* 9.0  RBC 4.14* 4.18* 4.37  HGB 12.6* 13.0 13.4  HCT 39.9 38.8* 40.8  MCV 96.4 92.8 93.4  MCH 30.4 31.1 30.7  MCHC 31.6 33.5 32.8  RDW 14.1 13.9 13.9  PLT 115* 125* 189    Radiology    No results found.  Cardiac Studies   N/A  Patient Profile     Jason Giles is a 76 y.o. male with a hx of CAD s/p remote stenting, hypertension and hyperlipidemia who is being seen 12/26/2021 for the evaluation of post CABG atrial fibrillation at the request of Dr. Kipp Brood.  Assessment & Plan    New onset atrial fibrillation -Patient had atrial fibrillation during surgery requiring IV amiodarone with resolution.  Now getting loading dose of p.o. amiodarone 400 mg twice daily since 2/9>> intermittent afib>> improved. Maintaining sinus rhythm.  -Echo 1/26 with normal LVEF. Normal LA  size. -Continue Amiodarone 400mg  BID for 2 days then 200mg  BID x 7 days and then 200mg  daily.  Hopefully this will resolve with surgical recovery.  Will need to monitor to determine burden prior to stopping amiodarone. Will avoid anticoagulation as his afib related to surgery. - Continue BB.    2. CAD s/p remote stenting - Underwent CABG x 3 12/20/21 -No chest pain.  Recovering well. -Continue aspirin 324 mg daily -Continue high intensity statin and BB   3.  AKI -Improving   4. DM - On metformin   Will sign off. Call with questions. Appointment has been arranged.   For questions or updates, please contact [place] Please consult [email or web address] for contact info under   SignedLeanor Kail, PA  12/27/2021, 8:10 AM

## 2021-12-27 NOTE — Progress Notes (Signed)
Discharge:  Medications and summary reviewed with patient and wife.  Answered all questions. IV access discontinued.  CCMD notified.   Assisted to private vehicle by staff w/o difficulty

## 2021-12-27 NOTE — Progress Notes (Signed)
Inpatient Diabetes Program Recommendations  AACE/ADA: New Consensus Statement on Inpatient Glycemic Control   Target Ranges:  Prepandial:   less than 140 mg/dL      Peak postprandial:   less than 180 mg/dL (1-2 hours)      Critically ill patients:  140 - 180 mg/dL    Latest Reference Range & Units 12/27/21 06:07  Glucose-Capillary 70 - 99 mg/dL 125 (H)    Latest Reference Range & Units 12/26/21 06:35 12/26/21 11:24 12/26/21 16:14 12/26/21 21:07  Glucose-Capillary 70 - 99 mg/dL 236 (H) 252 (H) 238 (H) 152 (H)   Review of Glycemic Control  Diabetes history: DM2 Outpatient Diabetes medications: Insulin pump: 3.6 units/hr (total basal is 86.4 units/24 hours) and 5-15 units tid with meals Current orders for Inpatient glycemic control: Levemir 24 units BID, Novolog 0-15 units TID with meals, Novolog 5 units TID with meals, Metformin 850 mg QAM  Inpatient Diabetes Program Recommendations:    Insulin: Please consider increasing meal coverage to Novolog 8 units TID with meals.  Thanks, Barnie Alderman, RN, MSN, CDE Diabetes Coordinator Inpatient Diabetes Program [phone] (Team Pager from 8am to 5pm)

## 2021-12-28 DIAGNOSIS — Z955 Presence of coronary angioplasty implant and graft: Secondary | ICD-10-CM | POA: Diagnosis not present

## 2021-12-28 DIAGNOSIS — Z86010 Personal history of colonic polyps: Secondary | ICD-10-CM | POA: Diagnosis not present

## 2021-12-28 DIAGNOSIS — Z7982 Long term (current) use of aspirin: Secondary | ICD-10-CM | POA: Diagnosis not present

## 2021-12-28 DIAGNOSIS — I2510 Atherosclerotic heart disease of native coronary artery without angina pectoris: Secondary | ICD-10-CM | POA: Diagnosis not present

## 2021-12-28 DIAGNOSIS — E119 Type 2 diabetes mellitus without complications: Secondary | ICD-10-CM | POA: Diagnosis not present

## 2021-12-28 DIAGNOSIS — Z48812 Encounter for surgical aftercare following surgery on the circulatory system: Secondary | ICD-10-CM | POA: Diagnosis not present

## 2021-12-28 DIAGNOSIS — I951 Orthostatic hypotension: Secondary | ICD-10-CM | POA: Diagnosis not present

## 2021-12-28 DIAGNOSIS — Z87891 Personal history of nicotine dependence: Secondary | ICD-10-CM | POA: Diagnosis not present

## 2021-12-28 DIAGNOSIS — I10 Essential (primary) hypertension: Secondary | ICD-10-CM | POA: Diagnosis not present

## 2021-12-28 DIAGNOSIS — Z87442 Personal history of urinary calculi: Secondary | ICD-10-CM | POA: Diagnosis not present

## 2021-12-28 DIAGNOSIS — Z7984 Long term (current) use of oral hypoglycemic drugs: Secondary | ICD-10-CM | POA: Diagnosis not present

## 2021-12-28 DIAGNOSIS — Z951 Presence of aortocoronary bypass graft: Secondary | ICD-10-CM | POA: Diagnosis not present

## 2021-12-28 DIAGNOSIS — E785 Hyperlipidemia, unspecified: Secondary | ICD-10-CM | POA: Diagnosis not present

## 2021-12-28 DIAGNOSIS — Z794 Long term (current) use of insulin: Secondary | ICD-10-CM | POA: Diagnosis not present

## 2021-12-28 DIAGNOSIS — K5790 Diverticulosis of intestine, part unspecified, without perforation or abscess without bleeding: Secondary | ICD-10-CM | POA: Diagnosis not present

## 2021-12-28 DIAGNOSIS — Z8719 Personal history of other diseases of the digestive system: Secondary | ICD-10-CM | POA: Diagnosis not present

## 2021-12-28 NOTE — Progress Notes (Signed)
Cardiology Office Note    Date:  01/09/2022   ID:  Jason Giles, Jason Giles 12-03-1945, MRN [id number]   PCP:  Haywood Pao, MD   [place]  Cardiologist:  Mertie Moores, MD   Advanced Practice Provider:  No care team member to display Electrophysiologist:  None   [id number]   Chief Complaint  Patient presents with   Hospitalization Follow-up    History of Present Illness:  Jason Giles is a 76 y.o. male with a hx of CAD s/p remote stenting, hypertension and hyperlipidemia    Patient had chest pain concerning of angina.  Follow-up coronary CTA was abnormal. Follow up cath 12/02/21 showed Multivessel disease consisting of left main, LAD, collateralized chronically occluded left circumflex, and high-grade right coronary disease.  He underwent CABG times 3-12/20/21.  He developed postop atrial fibrillation/VT given IV amiodarone he did have some recurrent runs and was switched to oral amiodarone.  Echo 12/08/2021 normal LVEF and normal LA size.  Plan to monitor burden prior to stopping amiodarone.  No anticoagulation given A-fib related to surgery.  Patient comes in for post hospital f/u. Sore in chest. very weak. Coughing up clear phlegm. Has PT  twice a week. No palpitations. Not sleeping well-can't get comfortable in bed/chairs. Glucose monitor going off multiple times and insulin has been reduced twice by PCP.   Past Medical History:  Diagnosis Date   Arthritis    Colon polyp    adenomatous   Coronary artery disease    post PTCA and stenting   CORONARY ATHEROSCLEROSIS NATIVE CORONARY ARTERY 12/21/2009   Diabetes mellitus    Diverticulosis    History of kidney stones    Hyperlipidemia    Hypertension     Past Surgical History:  Procedure Laterality Date   CORONARY ANGIOPLASTY WITH STENT PLACEMENT     left circumflex artery   CORONARY ARTERY BYPASS GRAFT N/A 12/20/2021   Procedure: CORONARY ARTERY BYPASS GRAFTING (CABG) TIMES THREE, USING LEFT  INTERNAL MAMMARY ARTERY AND ENDOSCOPICALLY HARVESTED RIGHT GREATER SAPHENOUS VEIN;  Surgeon: Lajuana Matte, MD;  Location: [facility];  Service: Open Heart Surgery;  Laterality: N/A;   ENDOVEIN HARVEST OF GREATER SAPHENOUS VEIN Right 12/20/2021   Procedure: ENDOVEIN HARVEST OF GREATER SAPHENOUS VEIN;  Surgeon: Lajuana Matte, MD;  Location: [facility];  Service: Open Heart Surgery;  Laterality: Right;   LEFT HEART CATH AND CORONARY ANGIOGRAPHY N/A 12/02/2021   Procedure: LEFT HEART CATH AND CORONARY ANGIOGRAPHY;  Surgeon: Early Osmond, MD;  Location: [facility];  Service: Cardiovascular;  Laterality: N/A;   TEE WITHOUT CARDIOVERSION N/A 12/20/2021   Procedure: TRANSESOPHAGEAL ECHOCARDIOGRAM (TEE);  Surgeon: Lajuana Matte, MD;  Location: [facility];  Service: Open Heart Surgery;  Laterality: N/A;    Current Medications: Current Meds  Medication Sig   acetaminophen (TYLENOL) 325 MG tablet Take 2 tablets (650 mg total) by mouth every 6 (six) hours as needed for mild pain or fever. (Patient taking differently: Take 500 mg by mouth every 6 (six) hours as needed for mild pain or fever.)   amiodarone (PACERONE) 200 MG tablet Take 2 tablets (400 mg total) by mouth 2 (two) times daily. X 2 days, then decrease to 200 mg BID x 7 days, then decrease to 200 mg daily (Patient taking differently: Take 200 mg by mouth daily.)   aspirin 81 MG tablet Take 81 mg by mouth daily.   b complex vitamins capsule Take 1 capsule  by mouth daily.   fenofibrate micronized (LOFIBRA) 134 MG capsule Take 134 mg by mouth daily before breakfast.    insulin aspart (NOVOLOG) 100 UNIT/ML injection Inject into the skin daily. Per pump basal rate 3.6 units/hour  bolus 5-15 units per meal   Insulin Human (INSULIN PUMP) SOLN Inject into the skin continuous. Using Novolog   JARDIANCE 25 MG TABS tablet Take 25 mg by mouth daily.    metFORMIN (GLUCOPHAGE) 850 MG tablet Take 850 mg by mouth daily with breakfast.    metoprolol  tartrate (LOPRESSOR) 25 MG tablet Take 1 tablet (25 mg total) by mouth 2 (two) times daily.   multivitamin (ONE-A-DAY MEN'S) TABS tablet Take 1 tablet by mouth daily.   nitroGLYCERIN (NITROSTAT) 0.4 MG SL tablet Place 1 tablet (0.4 mg total) under the tongue every 5 (five) minutes as needed for chest pain.   rosuvastatin (CRESTOR) 40 MG tablet Take 40 mg by mouth daily.   sodium chloride (OCEAN) 0.65 % SOLN nasal spray Place 1 spray into both nostrils as needed for congestion.   tobramycin (TOBREX) 0.3 % ophthalmic solution Place 1 drop into both eyes See admin instructions. Instill 1 drop into both eyes daily for 2 days before and 2 days after monthly eye injections   traMADol (ULTRAM) 50 MG tablet Take 1 tablet (50 mg total) by mouth every 4 (four) hours as needed for moderate pain.   [DISCONTINUED] metoprolol tartrate (LOPRESSOR) 50 MG tablet Take 1 tablet (50 mg total) by mouth 2 (two) times daily.     Allergies:   Patient has no known allergies.   Social History   Socioeconomic History   Marital status: Married    Spouse name: Not on file   Number of children: 4   Years of education: Not on file   Highest education level: Not on file  Occupational History   Occupation: Retired  Tobacco Use   Smoking status: Former    Types: Cigarettes    Quit date: 11/13/1985    Years since quitting: 36.1   Smokeless tobacco: Never  Vaping Use   Vaping Use: Never used  Substance and Sexual Activity   Alcohol use: Yes    Alcohol/week: 0.0 standard drinks    Comment: Occassionally   Drug use: No   Sexual activity: Not on file  Other Topics Concern   Not on file  Social History Narrative   Not on file   Social Determinants of Health   Financial Resource Strain: Not on file  Food Insecurity: Not on file  Transportation Needs: Not on file  Physical Activity: Not on file  Stress: Not on file  Social Connections: Not on file     Family History:  The patient's  family history includes  Cancer in his father; Colon cancer in his mother.   ROS:   Please see the history of present illness.    ROS All other systems reviewed and are negative.   PHYSICAL EXAM:   VS:  BP 118/70    Pulse 68    Ht 5' 11.5" (1.816 m)    Wt 224 lb (101.6 kg)    SpO2 97%    BMI 30.81 kg/m   Physical Exam  GEN: Obese, in no acute distress  Neck: no JVD, carotid bruits, or masses Cardiac:RRR; no murmurs, rubs, or gallops  Respiratory:  clear to auscultation bilaterally, normal work of breathing GI: soft, nontender, nondistended, + BS Ext: trace ankle edema, otherwise without cyanosis, clubbing,  Good distal  pulses bilaterally Neuro:  Alert and Oriented x 3 Psych: euthymic mood, full affect  Wt Readings from Last 3 Encounters:  01/09/22 224 lb (101.6 kg)  12/27/21 232 lb 11.2 oz (105.6 kg)  12/16/21 232 lb 6.4 oz (105.4 kg)      Studies/Labs Reviewed:   EKG:  EKG is  ordered today.  The ekg ordered today demonstrates NSR with long first degree AV block ST T wave changes anteriorly-new changes from 12/21/21 -reviewed with Dr. Forde Radon below   Recent Labs: 12/16/2021: ALT 24 12/21/2021: Magnesium 2.4 12/25/2021: BUN 34; Creatinine, Ser 1.34; Hemoglobin 13.4; Platelets 189; Potassium 3.8; Sodium 136   Lipid Panel    Component Value Date/Time   CHOL 56 12/21/2021 0434   TRIG 60 12/21/2021 0434   HDL 25 (L) 12/21/2021 0434   CHOLHDL 2.2 12/21/2021 0434   VLDL 12 12/21/2021 0434   LDLCALC 19 12/21/2021 0434    Additional studies/ records that were reviewed today include:    2D echo 12/08/2021 IMPRESSIONS     1. Left ventricular ejection fraction, by estimation, is 60 to 65%. The  left ventricle has normal function. The left ventricle has no regional  wall motion abnormalities. There is moderate-to-severe hypertrophy of the  basal septum. The rest of the LV  segments demonstrate mild left ventricular hypertrophy. Left ventricular  diastolic parameters are consistent with Grade I  diastolic dysfunction  (impaired relaxation).   2. Right ventricular systolic function is normal. The right ventricular  size is normal. Tricuspid regurgitation signal is inadequate for assessing  PA pressure.   3. The mitral valve is normal in structure. Mild mitral valve  regurgitation.   4. The aortic valve is tricuspid. There is mild calcification of the  aortic valve. There is mild thickening of the aortic valve. Aortic valve  regurgitation is not visualized. Aortic valve sclerosis/calcification is  present, without any evidence of  aortic stenosis.   5. Aortic dilatation noted. 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   Comparison(s): Compared to prior TTE in 06/2019, there is no significant  change.   FINDINGS   Left Ventricle: Left ventricular ejection fraction, by estimation, is 60  to 65%. The left ventricle has normal function. The left ventricle has no  regional wall motion abnormalities. The left ventricular internal cavity  size was normal in size. There is   moderate-to-severe hypertrophy of the basal septum. The rest of the LV  segments demonstrate mild left ventricular hypertrophy. Left ventricular  diastolic parameters are consistent with Grade I diastolic dysfunction  (impaired relaxation).   Risk Assessment/Calculations:    CHA2DS2-VASc Score = 5   This indicates a 7.2% annual risk of stroke. The patient's score is based upon: CHF History: 0 HTN History: 1 Diabetes History: 1 Stroke History: 0 Vascular Disease History: 1 Age Score: 2 Gender Score: 0        ASSESSMENT:    1. Coronary artery disease involving coronary bypass graft of native heart without angina pectoris   2. Paroxysmal atrial fibrillation (HCC)   3. Essential hypertension   4. Hyperlipidemia, unspecified hyperlipidemia type      PLAN:  In order of problems listed above:  CAD status post CABG x 3 on 12/20/2021, very weak, sore, slow to recover,  trouble sleeping but slow progress. Overall looks good. Check bmet/cbc today. EKG with new TWI anteriorly-reviewed with Dr. Acie Fredrickson who suspects distal embolization at time of surgery. No need to get echo at this  time. Asymptomatic and will f/u as outpatient. F/u in 2 months.  Postop atrial fibrillation/VT treated with IV amiodarone and then converted to oral amiodarone not anticoagulated because postop A-fib, EKG NSR with long first degree AV block. Will reduce metoprolol 25 mg bid. Will need monitor prior to stopping amio  Hypertension-well controlled  Hyperlipidemia on crestor and zetia LDL 19 12/21/21  Shared Decision Making/Informed Consent        Medication Adjustments/Labs and Tests Ordered: Current medicines are reviewed at length with the patient today.  Concerns regarding medicines are outlined above.  Medication changes, Labs and Tests ordered today are listed in the Patient Instructions below. Patient Instructions  Medication Instructions:  Your physician has recommended you make the following change in your medication:   REDUCE the Metoprolol to 25 mg taking 1 tablet twice a day.  You can use the 50 mg tablets and take 1/2 tablet twice a day to use them up.  *If you need a refill on your cardiac medications before your next appointment, please call your pharmacy*   Lab Work: TODAY:  BMET & CBC  If you have labs (blood work) drawn today and your tests are completely normal, you will receive your results only by: [place] (if you have MyChart) OR A paper copy in the mail If you have any lab test that is abnormal or we need to change your treatment, we will call you to review the results.   Testing/Procedures: None ordered   Follow-Up: At [facility], you and your health needs are our priority.  As part of our continuing mission to provide you with exceptional heart care, we have created designated Provider Care Teams.  These Care Teams include your primary  Cardiologist (physician) and Advanced Practice Providers (APPs -  Physician Assistants and Nurse Practitioners) who all work together to provide you with the care you need, when you need it.  We recommend signing up for the patient portal called "MyChart".  Sign up information is provided on this After Visit Summary.  MyChart is used to connect with patients for Virtual Visits (Telemedicine).  Patients are able to view lab/test results, encounter notes, upcoming appointments, etc.  Non-urgent messages can be sent to your provider as well.   To learn more about what you can do with MyChart, go to [email or web address].    Your next appointment:   03/09/22  The format for your next appointment:   In Person  Provider:   Mertie Moores, MD     Other Instructions    Signed, Ermalinda Barrios, PA-C  01/09/2022 10:37 AM    [address], [place], [place]  [address] Phone: [phone]; Fax: [phone]

## 2021-12-30 ENCOUNTER — Other Ambulatory Visit: Payer: Self-pay

## 2021-12-30 ENCOUNTER — Ambulatory Visit (INDEPENDENT_AMBULATORY_CARE_PROVIDER_SITE_OTHER): Payer: Self-pay | Admitting: Thoracic Surgery (Cardiothoracic Vascular Surgery)

## 2021-12-30 DIAGNOSIS — I2510 Atherosclerotic heart disease of native coronary artery without angina pectoris: Secondary | ICD-10-CM

## 2021-12-30 DIAGNOSIS — Z951 Presence of aortocoronary bypass graft: Secondary | ICD-10-CM

## 2021-12-30 NOTE — Progress Notes (Signed)
°   °  [address]       [place] [address]             [phone]       Patient: Home Provider: Office Consent for Telemedicine visit obtained.  Todays visit was completed via a real-time telehealth (see specific modality noted below). The patient/authorized person provided oral consent at the time of the visit to engage in a telemedicine encounter with the present provider at [facility]. The patient/authorized person was informed of the potential benefits, limitations, and risks of telemedicine. The patient/authorized person expressed understanding that the laws that protect confidentiality also apply to telemedicine. The patient/authorized person acknowledged understanding that telemedicine does not provide emergency services and that he or she would need to call 911 or proceed to the nearest hospital for help if such a need arose.   Total time spent in the clinical discussion 10 minutes.  Telehealth Modality: Phone visit (audio only)  I had a telephone visit with  Jason Giles who is s/p CABG.  Overall doing well.   Pain is minimal.  Ambulating well, but admits to easy fatigability. Vitals have been stable.  Jason Giles will see [place] back in 1 month with a chest x-ray for cardiac rehab clearance.  Aevah Stansbery Bary Leriche

## 2022-01-02 DIAGNOSIS — Z951 Presence of aortocoronary bypass graft: Secondary | ICD-10-CM | POA: Diagnosis not present

## 2022-01-02 DIAGNOSIS — I2510 Atherosclerotic heart disease of native coronary artery without angina pectoris: Secondary | ICD-10-CM | POA: Diagnosis not present

## 2022-01-02 DIAGNOSIS — E1129 Type 2 diabetes mellitus with other diabetic kidney complication: Secondary | ICD-10-CM | POA: Diagnosis not present

## 2022-01-02 DIAGNOSIS — Z794 Long term (current) use of insulin: Secondary | ICD-10-CM | POA: Diagnosis not present

## 2022-01-05 ENCOUNTER — Telehealth (HOSPITAL_COMMUNITY): Payer: Self-pay

## 2022-01-05 NOTE — Telephone Encounter (Signed)
Attempted to call patient in regards to Cardiac Rehab - LM on VM 

## 2022-01-05 NOTE — Telephone Encounter (Signed)
Pt insurance is active and benefits verified through [facility]. Co-pay $0.00, DED $0.00/$0.00 met, out of pocket $3,950.00/$2,374.79 met, co-insurance 0%. No pre-authorization required. Passport, 01/05/22 @ 1:33PM, KKX#[id number]   Will contact patient to see if he is interested in the Cardiac Rehab Program. If interested, patient will need to complete follow up appt. Once completed, patient will be contacted for scheduling upon review by the RN Navigator.

## 2022-01-09 ENCOUNTER — Other Ambulatory Visit: Payer: Self-pay

## 2022-01-09 ENCOUNTER — Ambulatory Visit: Payer: Medicare Other | Admitting: Physician Assistant

## 2022-01-09 ENCOUNTER — Encounter: Payer: Self-pay | Admitting: Physician Assistant

## 2022-01-09 VITALS — BP 118/70 | HR 68 | Ht 71.5 in | Wt 224.0 lb

## 2022-01-09 DIAGNOSIS — I25810 Atherosclerosis of coronary artery bypass graft(s) without angina pectoris: Secondary | ICD-10-CM | POA: Diagnosis not present

## 2022-01-09 DIAGNOSIS — E785 Hyperlipidemia, unspecified: Secondary | ICD-10-CM

## 2022-01-09 DIAGNOSIS — I10 Essential (primary) hypertension: Secondary | ICD-10-CM

## 2022-01-09 DIAGNOSIS — I480 Paroxysmal atrial fibrillation: Secondary | ICD-10-CM | POA: Diagnosis not present

## 2022-01-09 LAB — CBC
Hematocrit: 41.3 % (ref 37.5–51.0)
Hemoglobin: 13.9 g/dL (ref 13.0–17.7)
MCH: 30.8 pg (ref 26.6–33.0)
MCHC: 33.7 g/dL (ref 31.5–35.7)
MCV: 91 fL (ref 79–97)
Platelets: 343 10*3/uL (ref 150–450)
RBC: 4.52 x10E6/uL (ref 4.14–5.80)
RDW: 12.6 % (ref 11.6–15.4)
WBC: 8.2 10*3/uL (ref 3.4–10.8)

## 2022-01-09 LAB — BASIC METABOLIC PANEL
BUN/Creatinine Ratio: 13 (ref 10–24)
BUN: 19 mg/dL (ref 8–27)
CO2: 21 mmol/L (ref 20–29)
Calcium: 9.1 mg/dL (ref 8.6–10.2)
Chloride: 105 mmol/L (ref 96–106)
Creatinine, Ser: 1.46 mg/dL — ABNORMAL HIGH (ref 0.76–1.27)
Glucose: 159 mg/dL — ABNORMAL HIGH (ref 70–99)
Potassium: 4.6 mmol/L (ref 3.5–5.2)
Sodium: 138 mmol/L (ref 134–144)
eGFR: 50 mL/min/{1.73_m2} — ABNORMAL LOW (ref >59)

## 2022-01-09 MED ORDER — METOPROLOL TARTRATE 25 MG PO TABS: 25.0000 mg | ORAL_TABLET | Freq: Two times a day (BID) | ORAL | 3 refills | Status: DC

## 2022-01-09 NOTE — Patient Instructions (Addendum)
Medication Instructions:  Your physician has recommended you make the following change in your medication:   REDUCE the Metoprolol to 25 mg taking 1 tablet twice a day.  You can use the 50 mg tablets and take 1/2 tablet twice a day to use them up.  *If you need a refill on your cardiac medications before your next appointment, please call your pharmacy*   Lab Work: TODAY:  BMET & CBC  If you have labs (blood work) drawn today and your tests are completely normal, you will receive your results only by: [place] (if you have MyChart) OR A paper copy in the mail If you have any lab test that is abnormal or we need to change your treatment, we will call you to review the results.   Testing/Procedures: None ordered   Follow-Up: At [facility], you and your health needs are our priority.  As part of our continuing mission to provide you with exceptional heart care, we have created designated Provider Care Teams.  These Care Teams include your primary Cardiologist (physician) and Advanced Practice Providers (APPs -  Physician Assistants and Nurse Practitioners) who all work together to provide you with the care you need, when you need it.  We recommend signing up for the patient portal called "MyChart".  Sign up information is provided on this After Visit Summary.  MyChart is used to connect with patients for Virtual Visits (Telemedicine).  Patients are able to view lab/test results, encounter notes, upcoming appointments, etc.  Non-urgent messages can be sent to your provider as well.   To learn more about what you can do with MyChart, go to [email or web address].    Your next appointment:   03/09/22  The format for your next appointment:   In Person  Provider:   Mertie Moores, MD     Other Instructions

## 2022-01-10 DIAGNOSIS — I2510 Atherosclerotic heart disease of native coronary artery without angina pectoris: Secondary | ICD-10-CM | POA: Diagnosis not present

## 2022-01-10 DIAGNOSIS — N1831 Chronic kidney disease, stage 3a: Secondary | ICD-10-CM | POA: Diagnosis not present

## 2022-01-10 DIAGNOSIS — I1310 Hypertensive heart and chronic kidney disease without heart failure, with stage 1 through stage 4 chronic kidney disease, or unspecified chronic kidney disease: Secondary | ICD-10-CM | POA: Diagnosis not present

## 2022-01-10 DIAGNOSIS — E7800 Pure hypercholesterolemia, unspecified: Secondary | ICD-10-CM | POA: Diagnosis not present

## 2022-01-17 LAB — ECHO INTRAOPERATIVE TEE
AR max vel: 2.87 cm2
AV Area VTI: 2.49 cm2
AV Area mean vel: 2.74 cm2
AV Mean grad: 2 mmHg
AV Peak grad: 3.6 mmHg
Ao pk vel: 0.95 m/s
Height: 71 in
MV VTI: 2.69 cm2
MV Vena cont: 0.4 cm
Weight: 3680 oz

## 2022-01-18 DIAGNOSIS — H43811 Vitreous degeneration, right eye: Secondary | ICD-10-CM | POA: Diagnosis not present

## 2022-01-18 DIAGNOSIS — H35371 Puckering of macula, right eye: Secondary | ICD-10-CM | POA: Diagnosis not present

## 2022-01-18 DIAGNOSIS — E113313 Type 2 diabetes mellitus with moderate nonproliferative diabetic retinopathy with macular edema, bilateral: Secondary | ICD-10-CM | POA: Diagnosis not present

## 2022-01-18 DIAGNOSIS — H26491 Other secondary cataract, right eye: Secondary | ICD-10-CM | POA: Diagnosis not present

## 2022-01-23 ENCOUNTER — Other Ambulatory Visit: Payer: Self-pay | Admitting: Thoracic Surgery (Cardiothoracic Vascular Surgery)

## 2022-01-23 DIAGNOSIS — Z951 Presence of aortocoronary bypass graft: Secondary | ICD-10-CM

## 2022-01-24 ENCOUNTER — Ambulatory Visit: Payer: Medicare Other | Admitting: Physician Assistant

## 2022-01-26 ENCOUNTER — Ambulatory Visit
Admission: RE | Admit: 2022-01-26 | Discharge: 2022-01-26 | Disposition: A | Discharge status: Home / Self Care | Payer: Medicare Other | Source: Ambulatory Visit | Attending: Thoracic Surgery (Cardiothoracic Vascular Surgery) | Admitting: Thoracic Surgery (Cardiothoracic Vascular Surgery)

## 2022-01-26 ENCOUNTER — Other Ambulatory Visit: Payer: Self-pay

## 2022-01-26 ENCOUNTER — Ambulatory Visit (INDEPENDENT_AMBULATORY_CARE_PROVIDER_SITE_OTHER): Payer: Self-pay | Admitting: Physician Assistant

## 2022-01-26 VITALS — BP 113/71 | HR 80 | Resp 20 | Ht 71.0 in | Wt 222.0 lb

## 2022-01-26 DIAGNOSIS — Z951 Presence of aortocoronary bypass graft: Secondary | ICD-10-CM

## 2022-01-26 DIAGNOSIS — I2510 Atherosclerotic heart disease of native coronary artery without angina pectoris: Secondary | ICD-10-CM

## 2022-01-26 NOTE — Progress Notes (Signed)
? ?   ?[address] ?      [place] [address] ?            [phone]   ? ?  ? ?HPI: ?Mr. Moltz is a 76 year old gentleman with past history of hypertension, dyslipidemia, type 2 diabetes mellitus managed with oral medications and an insulin pump, and coronary artery disease with preserved ventricular function. ?Patient returns for routine postoperative follow-up having undergone CABG x3 on 12/20/2021 by Dr. Kipp Brood.  His postoperative course was significant for mild renal insufficiency with a peak creatinine of 1.76 and evidence of resolution prior to discharge.  He also had some atrial fibrillation and atrial flutter that was managed with oral amiodarone.  He was discharged in stable sinus rhythm with first-degree AV block. ?He had a virtual office visit with Dr. Kipp Brood about 1 month ago and was doing well at that time with no new issues or concerns.  He has also been seen in follow-up by Ms. Estella Husk, on 01/09/2022.  At that time, he was in sinus rhythm with a long first-degree AV block.  His metoprolol dosing was reduced to 25 mg p.o. twice daily.  He remains on a tapering dose of amiodarone which is currently at 200 mg by mouth once daily. ? ?Since hospital discharge the patient reports he has continued to progress with strength and endurance.  He had very little pain after discharging home requiring Tylenol and a few occasions.  He denies any shortness of breath or palpitations.  He is no longer using a walker for ambulation. ? ? ?Current Outpatient Medications  ?Medication Sig Dispense Refill  ? acetaminophen (TYLENOL) 325 MG tablet Take 2 tablets (650 mg total) by mouth every 6 (six) hours as needed for mild pain or fever. (Patient taking differently: Take 500 mg by mouth every 6 (six) hours as needed for mild pain or fever.)    ? amiodarone (PACERONE) 200 MG tablet Take 2 tablets (400 mg total) by mouth 2 (two) times daily. X 2 days, then decrease to 200 mg BID x 7 days, then  decrease to 200 mg daily (Patient taking differently: Take 200 mg by mouth daily.) 90 tablet 1  ? aspirin 81 MG tablet Take 81 mg by mouth daily.    ? b complex vitamins capsule Take 1 capsule by mouth daily.    ? benzonatate (TESSALON) 100 MG capsule Take 1 capsule (100 mg total) by mouth 3 (three) times daily as needed for cough. (Patient not taking: Reported on 01/09/2022) 20 capsule 0  ? fenofibrate micronized (LOFIBRA) 134 MG capsule Take 134 mg by mouth daily before breakfast.     ? insulin aspart (NOVOLOG) 100 UNIT/ML injection Inject into the skin daily. Per pump basal rate 3.6 units/hour  bolus 5-15 units per meal    ? Insulin Human (INSULIN PUMP) SOLN Inject into the skin continuous. Using Novolog    ? JARDIANCE 25 MG TABS tablet Take 25 mg by mouth daily.   3  ? metFORMIN (GLUCOPHAGE) 850 MG tablet Take 850 mg by mouth daily with breakfast.     ? metoprolol tartrate (LOPRESSOR) 25 MG tablet Take 1 tablet (25 mg total) by mouth 2 (two) times daily. 180 tablet 3  ? multivitamin (ONE-A-DAY MEN'S) TABS tablet Take 1 tablet by mouth daily.    ? nitroGLYCERIN (NITROSTAT) 0.4 MG SL tablet Place 1 tablet (0.4 mg total) under the tongue every 5 (five) minutes as needed for chest pain. 25 tablet  1  ? rosuvastatin (CRESTOR) 40 MG tablet Take 40 mg by mouth daily.    ? sodium chloride (OCEAN) 0.65 % SOLN nasal spray Place 1 spray into both nostrils as needed for congestion.    ? tobramycin (TOBREX) 0.3 % ophthalmic solution Place 1 drop into both eyes See admin instructions. Instill 1 drop into both eyes daily for 2 days before and 2 days after monthly eye injections    ? traMADol (ULTRAM) 50 MG tablet Take 1 tablet (50 mg total) by mouth every 4 (four) hours as needed for moderate pain. 30 tablet 0  ? ?No current facility-administered medications for this visit.  ? ? ?Physical Exam ? ?Vital signs ?BP 113/71 ?Pulse 80 ?Respirations 20 ?SPO2 98% on room air ? ?General: Mr. [place]ont is in good spirits today.  He has no  specific complaints.  Walking independently.  His wife accompanies him today. ?Heart: Regular rate and rhythm.  No murmur. ?Chest: Breath sounds are full, equal, and clear to auscultation.  The chest x-ray shows clear lung fields.  The left basilar atelectasis is cleared.  He has no significant effusions. ?Extremities: No peripheral edema.  The right lower extremity EVH incision site is well-healed. ? ? ? ?Diagnostic Tests: ?CLINICAL DATA:  Status post coronary bypass surgery ?  ?EXAM: ?CHEST - 2 VIEW ?  ?COMPARISON:  12/25/2021 ?  ?FINDINGS: ?Transverse diameter of heart is in the upper limits of normal. There ?is evidence of coronary bypass surgery. There is improvement in ?aeration of lower lung fields. There are no signs of pulmonary edema ?or new focal infiltrates. There is no pleural effusion or ?pneumothorax. ?  ?IMPRESSION: ?There is interval improvement in aeration of lower lung fields ?suggesting clearing of subsegmental atelectasis. There are no signs ?of pulmonary edema or new focal infiltrates. ?  ?  ?Electronically Signed ?  By: Elmer Picker M.D. ?  On: 01/26/2022 13:06 ? ?Impression / Plan: ?Mr. Alarid is making excellent progress following coronary bypass grafting x3 on 12/20/2021 by Dr. Kipp Brood.  His biggest problem since going home has been glucose management.  He has an insulin pump that alarms when he becomes hypoglycemic.  This often occurs at night so that he has to get out of bed and eat something to get it corrected.  His endocrinologist is managing this and has decreased the insulin dose 4 times since his discharge. ?At this point, he may gradually increase his activity.  I cautioned him to continue to observe sternal precautions with no lifting more than 15 pounds for another 6 weeks.  He may resume driving.  No change in medications from our standpoint.  He appears to be staying in sinus rhythm based on chart review and the exam here in the office today.  The cardiology team is  planning to utilize an event recorder prior to discontinuing the amiodarone. ? ?We will make a referral to cardiac rehab team. ? ?I will not schedule any follow-up but ensured Mr. Nurse and his wife we will be very happy to see him if any problems arise related to his surgery. ? ?Antony Odea, PA-C ?[facility] ?[phone] ? ? ? ? ?

## 2022-01-26 NOTE — Patient Instructions (Signed)
Continue to observe sternal precautions for another 6 weeks with no lifting greater than 15 pounds ? ?May resume driving ? ?We will make a referral to cardiac rehab and you may begin this program at any time. ? ?No change in medications from CT surgery standpoint ? ?Follow-up as needed ?

## 2022-01-26 NOTE — Progress Notes (Signed)
Referral to Outpatient Cardiac Rehab per Dr. Kipp Brood. ?

## 2022-01-30 DIAGNOSIS — I2510 Atherosclerotic heart disease of native coronary artery without angina pectoris: Secondary | ICD-10-CM | POA: Diagnosis not present

## 2022-01-30 DIAGNOSIS — I951 Orthostatic hypotension: Secondary | ICD-10-CM | POA: Diagnosis not present

## 2022-01-30 DIAGNOSIS — Z86010 Personal history of colonic polyps: Secondary | ICD-10-CM | POA: Diagnosis not present

## 2022-01-30 DIAGNOSIS — Z87891 Personal history of nicotine dependence: Secondary | ICD-10-CM | POA: Diagnosis not present

## 2022-01-30 DIAGNOSIS — Z7984 Long term (current) use of oral hypoglycemic drugs: Secondary | ICD-10-CM | POA: Diagnosis not present

## 2022-01-30 DIAGNOSIS — E785 Hyperlipidemia, unspecified: Secondary | ICD-10-CM | POA: Diagnosis not present

## 2022-01-30 DIAGNOSIS — Z87442 Personal history of urinary calculi: Secondary | ICD-10-CM | POA: Diagnosis not present

## 2022-01-30 DIAGNOSIS — I10 Essential (primary) hypertension: Secondary | ICD-10-CM | POA: Diagnosis not present

## 2022-01-30 DIAGNOSIS — Z794 Long term (current) use of insulin: Secondary | ICD-10-CM | POA: Diagnosis not present

## 2022-01-30 DIAGNOSIS — Z955 Presence of coronary angioplasty implant and graft: Secondary | ICD-10-CM | POA: Diagnosis not present

## 2022-01-30 DIAGNOSIS — Z48812 Encounter for surgical aftercare following surgery on the circulatory system: Secondary | ICD-10-CM | POA: Diagnosis not present

## 2022-01-30 DIAGNOSIS — E119 Type 2 diabetes mellitus without complications: Secondary | ICD-10-CM | POA: Diagnosis not present

## 2022-01-30 DIAGNOSIS — Z7982 Long term (current) use of aspirin: Secondary | ICD-10-CM | POA: Diagnosis not present

## 2022-01-30 DIAGNOSIS — Z951 Presence of aortocoronary bypass graft: Secondary | ICD-10-CM | POA: Diagnosis not present

## 2022-01-30 DIAGNOSIS — Z8719 Personal history of other diseases of the digestive system: Secondary | ICD-10-CM | POA: Diagnosis not present

## 2022-01-30 DIAGNOSIS — K5790 Diverticulosis of intestine, part unspecified, without perforation or abscess without bleeding: Secondary | ICD-10-CM | POA: Diagnosis not present

## 2022-02-16 DIAGNOSIS — E1129 Type 2 diabetes mellitus with other diabetic kidney complication: Secondary | ICD-10-CM | POA: Diagnosis not present

## 2022-02-24 DIAGNOSIS — E1165 Type 2 diabetes mellitus with hyperglycemia: Secondary | ICD-10-CM | POA: Diagnosis not present

## 2022-02-24 DIAGNOSIS — Z794 Long term (current) use of insulin: Secondary | ICD-10-CM | POA: Diagnosis not present

## 2022-02-24 DIAGNOSIS — E119 Type 2 diabetes mellitus without complications: Secondary | ICD-10-CM | POA: Diagnosis not present

## 2022-02-24 DIAGNOSIS — E11319 Type 2 diabetes mellitus with unspecified diabetic retinopathy without macular edema: Secondary | ICD-10-CM | POA: Diagnosis not present

## 2022-03-02 DIAGNOSIS — Z794 Long term (current) use of insulin: Secondary | ICD-10-CM | POA: Diagnosis not present

## 2022-03-02 DIAGNOSIS — E1165 Type 2 diabetes mellitus with hyperglycemia: Secondary | ICD-10-CM | POA: Diagnosis not present

## 2022-03-02 DIAGNOSIS — E11319 Type 2 diabetes mellitus with unspecified diabetic retinopathy without macular edema: Secondary | ICD-10-CM | POA: Diagnosis not present

## 2022-03-02 DIAGNOSIS — I1310 Hypertensive heart and chronic kidney disease without heart failure, with stage 1 through stage 4 chronic kidney disease, or unspecified chronic kidney disease: Secondary | ICD-10-CM | POA: Diagnosis not present

## 2022-03-02 DIAGNOSIS — I2510 Atherosclerotic heart disease of native coronary artery without angina pectoris: Secondary | ICD-10-CM | POA: Diagnosis not present

## 2022-03-02 DIAGNOSIS — E1129 Type 2 diabetes mellitus with other diabetic kidney complication: Secondary | ICD-10-CM | POA: Diagnosis not present

## 2022-03-02 DIAGNOSIS — E119 Type 2 diabetes mellitus without complications: Secondary | ICD-10-CM | POA: Diagnosis not present

## 2022-03-09 ENCOUNTER — Ambulatory Visit: Payer: Medicare Other | Admitting: Cardiovascular Disease

## 2022-03-09 ENCOUNTER — Encounter: Payer: Self-pay | Admitting: Cardiovascular Disease

## 2022-03-09 VITALS — BP 134/72 | HR 69 | Ht 71.0 in | Wt 226.4 lb

## 2022-03-09 DIAGNOSIS — I2510 Atherosclerotic heart disease of native coronary artery without angina pectoris: Secondary | ICD-10-CM | POA: Diagnosis not present

## 2022-03-09 DIAGNOSIS — E782 Mixed hyperlipidemia: Secondary | ICD-10-CM | POA: Diagnosis not present

## 2022-03-09 DIAGNOSIS — I25810 Atherosclerosis of coronary artery bypass graft(s) without angina pectoris: Secondary | ICD-10-CM

## 2022-03-09 NOTE — Patient Instructions (Signed)
Medication Instructions:  ?Your physician recommends that you continue on your current medications as directed. Please refer to the Current Medication list given to you today. ? ?*If you need a refill on your cardiac medications before your next appointment, please call your pharmacy* ? ? ?Lab Work: ?NONE ?If you have labs (blood work) drawn today and your tests are completely normal, you will receive your results only by: ?MyChart Message (if you have MyChart) OR ?A paper copy in the mail ?If you have any lab test that is abnormal or we need to change your treatment, we will call you to review the results. ? ? ?Testing/Procedures: ?NONE ? ? ?Follow-Up: ?At [facility], you and your health needs are our priority.  As part of our continuing mission to provide you with exceptional heart care, we have created designated Provider Care Teams.  These Care Teams include your primary Cardiologist (physician) and Advanced Practice Providers (APPs -  Physician Assistants and Nurse Practitioners) who all work together to provide you with the care you need, when you need it. ? ?Your next appointment:   ?6 month(s) ? ?The format for your next appointment:   ?In Person ? ?Provider: Theadora Rama, [place] ? ?Other Instructions ?  ? ?Important Information About Sugar ? ? ? ? ?  ? ?

## 2022-03-09 NOTE — Progress Notes (Signed)
Jason Giles ?Date of Birth  04/19/1946 ?[facility] / [facility] ?[address] ?[place], [place]  [address] ?[phone]  Fax  [phone] ? ?Problem List ?1. CAD - stenting 2011 ( Taxus DES in May , 2012)  CABG 3/23 ?2. Diabetes Mellitus  ?3. Essential Hypertension  ? ? ? ?Previous Notes:  ? ?Pt is doing well.  Has retired since last visit. No chest pain .  He denies any angina or dyspnea. ? ?Dec. 8.2015: ? ?Jason Giles is a 76 yo who I follow for CAD, HTN, hyperlipidemia.  ?He went to the old office today. ?No having any problems. ?Is scheduled to have a colonoscopy and cataract surgery.    ? ?Oct. 4, 2016: ? ?Jason Giles is doing well.   ?No CP or dyspnea.   ?Has been having some left arm discomfort.   Noticed it when he got up from bed to use the bathroom.   ?No pain .   Possibly overworked it the day before .  Had done some heavy lifting the day of this discomfort. ?Has felt fine since.  ?Has been doing this normal activities since  ? ?Oct. 31, 2017: ? ? ?Jason Giles is seen back for follow up of his coronary artery disease.  He has a history of essential hypertension and diabetes. ? ?We discussed [name].   His brother was the tennis coach up in [place] and he knows [place] .  ? ?No CP or dyspnea. ?Colman Cater to the dermatology - Basal cell CA on his nose ?Gets eye injections every 4 weeks  ( Macular degeneration )  ? ?Dec. 6, 2018: ? ?Jason Giles is seen today for a follow-up visit.  He has a history of coronary artery disease-status post stenting ? ?Still having issues with his eyes  ?No CP , no dyspnea.   ? ?Dec. 9, 2019: ? ?Doing well .    ?Hx of PCI  - no angina  ?Needs to work on diet ?Wt. Today is 234 lbs.  ? ?Labs from [employer] office ? Chol = 101 ?HDL = 31 ?LDL = 44 ?Trigs = 128.  ? ?Sept. 15, 2020 ? ?Jason Giles is seen today for further evaluation of his CAD and HTN ? ?He was in the hospital following an episode of syncope in August.  Clinically I thought that his symptoms were consistent with  orthostatic hypotension.  He had not eaten or had much to drink earlier that day.  We have encouraged him to drink more fluids. ?We have ordered a cardiac event monitor. ? ?He has still had some lightheadedness.  ?Gets dizzy when he gets up in the am .  ? ?February 02, 2020: ?Jason Giles is seen today for follow-up of his coronary artery disease and hypertension. ?He had a presyncopal episode.  It is not clear whether this was due to volume depletion or a diabetic issue.  We stopped his metoprolol and he has felt better. ?Exercising some ,  Walks 2 - 3 times a week .  ?No CP  ?Still eating salt  ?Trying out a new insulin pump  ?Labs from his primary medical doctor reveal a total cholesterol of 110.  His HDL is 36.  LDL is 54.  Triglyceride levels 101.  Hemoglobin A1c is 7.4.  Hemoglobin is 17.5. ? ?February 04, 2021: ?Jason Giles is seen today for follow up of his CAD ?A month ago , he fell off a brick step,  Was thought to have a viral  infection . ?No CP , no dyspnea ? ?Labs from his primary medical doctor in November, 2021 shows a total cholesterol of 94.  The HDL is 37.  LDL is 34.  Triglyceride levels 115. ? ? ?Dec. 2, 2022 ?Jason Giles is seen for follow up of his CAD  ?Seen with wife, [name]  ?Occurred 5 days ago ?Uneasy feeling in his chest when  he went to bed ?Not exertional , ?Lasted for several hours  ?Did not feel like indigestion  ?Has been outside working in the yard this week without any chest pain since that time  ?Has had more fatigue for the past several months .  ?Has not been exercisng all that regularly for the past year or so  ? ? ?Jan. 16, 2023: ?Jason Giles is seen today for follow up of some episodes of chest pain .  ?Coronary artery CT angio showed a CAC score of 4147. ?LM - 50% mid-distal stenosis ?LAD - severe calcified stenosis ?LCx-  severe mid LCx disease  ?RCA - moderate prox - mid disease ? ?Has not had any further episodes of CP since the original CP several months ago .  ? ?He has a history of coronary artery  disease.  In 2006 we placed a stent in the left circumflex artery ( 2.75 x 12 mm quantum [place]) .  He had moderate irregularities elsewhere which were treated medically. ? ?Has 2 tine pulmonary nodules  ?He is here to schedule cath  ? ?No regular exercise. ?Does yard work on most days  so he's is very active.   ? ?Walks 3-4 days a week . ?A mile at a time  ?Has lost some weight,  12 lbs  ?Taking a lower dose of insulin with good glucose levels  ? ?Is on amiodarone for post op PAF  ?Will D/C the amio in May  ?Cont metoprolol  ? ?Current Outpatient Medications on File Prior to Visit  ?Medication Sig Dispense Refill  ? acetaminophen (TYLENOL) 325 MG tablet Take 2 tablets (650 mg total) by mouth every 6 (six) hours as needed for mild pain or fever. (Patient taking differently: Take 500 mg by mouth every 6 (six) hours as needed for mild pain or fever.)    ? amiodarone (PACERONE) 200 MG tablet Take 2 tablets (400 mg total) by mouth 2 (two) times daily. X 2 days, then decrease to 200 mg BID x 7 days, then decrease to 200 mg daily (Patient taking differently: Take 200 mg by mouth daily.) 90 tablet 1  ? aspirin 81 MG tablet Take 81 mg by mouth daily.    ? b complex vitamins capsule Take 1 capsule by mouth daily.    ? fenofibrate micronized (LOFIBRA) 134 MG capsule Take 134 mg by mouth daily before breakfast.     ? insulin aspart (NOVOLOG) 100 UNIT/ML injection Inject into the skin daily. Per pump basal rate 3.6 units/hour  bolus 5-15 units per meal    ? JARDIANCE 25 MG TABS tablet Take 25 mg by mouth daily.   3  ? metFORMIN (GLUCOPHAGE) 850 MG tablet Take 850 mg by mouth daily with breakfast.     ? metoprolol tartrate (LOPRESSOR) 25 MG tablet Take 1 tablet (25 mg total) by mouth 2 (two) times daily. 180 tablet 3  ? multivitamin (ONE-A-DAY MEN'S) TABS tablet Take 1 tablet by mouth daily.    ? nitroGLYCERIN (NITROSTAT) 0.4 MG SL tablet Place 1 tablet (0.4 mg total) under the tongue every 5 (five) minutes as  needed for chest  pain. 25 tablet 1  ? rosuvastatin (CRESTOR) 40 MG tablet Take 40 mg by mouth daily.    ? tobramycin (TOBREX) 0.3 % ophthalmic solution Place 1 drop into both eyes See admin instructions. Instill 1 drop into both eyes daily for 2 days before and 2 days after monthly eye injections    ? benzonatate (TESSALON) 100 MG capsule Take 1 capsule (100 mg total) by mouth 3 (three) times daily as needed for cough. (Patient not taking: Reported on 03/09/2022) 20 capsule 0  ? Insulin Human (INSULIN PUMP) SOLN Inject into the skin continuous. Using Novolog (Patient not taking: Reported on 03/09/2022)    ? sodium chloride (OCEAN) 0.65 % SOLN nasal spray Place 1 spray into both nostrils as needed for congestion. (Patient not taking: Reported on 03/09/2022)    ? traMADol (ULTRAM) 50 MG tablet Take 1 tablet (50 mg total) by mouth every 4 (four) hours as needed for moderate pain. (Patient not taking: Reported on 03/09/2022) 30 tablet 0  ? ?No current facility-administered medications on file prior to visit.  ? ? ?No Known Allergies ? ?Past Medical History:  ?Diagnosis Date  ? Arthritis   ? Colon polyp   ? adenomatous  ? Coronary artery disease   ? post PTCA and stenting  ? CORONARY ATHEROSCLEROSIS NATIVE CORONARY ARTERY 12/21/2009  ? Diabetes mellitus   ? Diverticulosis   ? History of kidney stones   ? Hyperlipidemia   ? Hypertension   ? ? ?Past Surgical History:  ?Procedure Laterality Date  ? CORONARY ANGIOPLASTY WITH STENT PLACEMENT    ? left circumflex artery  ? CORONARY ARTERY BYPASS GRAFT N/A 12/20/2021  ? Procedure: CORONARY ARTERY BYPASS GRAFTING (CABG) TIMES THREE, USING LEFT INTERNAL MAMMARY ARTERY AND ENDOSCOPICALLY HARVESTED RIGHT GREATER SAPHENOUS VEIN;  Surgeon: Lajuana Matte, MD;  Location: [facility];  Service: Open Heart Surgery;  Laterality: N/A;  ? ENDOVEIN HARVEST OF GREATER SAPHENOUS VEIN Right 12/20/2021  ? Procedure: ENDOVEIN HARVEST OF GREATER SAPHENOUS VEIN;  Surgeon: Lajuana Matte, MD;  Location: [facility];   Service: Open Heart Surgery;  Laterality: Right;  ? LEFT HEART CATH AND CORONARY ANGIOGRAPHY N/A 12/02/2021  ? Procedure: LEFT HEART CATH AND CORONARY ANGIOGRAPHY;  Surgeon: Early Osmond, MD;  Location: [facility]

## 2022-03-10 ENCOUNTER — Telehealth (HOSPITAL_COMMUNITY): Payer: Self-pay

## 2022-03-10 ENCOUNTER — Encounter (HOSPITAL_COMMUNITY): Payer: Self-pay

## 2022-03-10 NOTE — Telephone Encounter (Signed)
Attempted to call patient in regards to Cardiac Rehab - LM on VM Mailed letter 

## 2022-03-10 NOTE — Telephone Encounter (Signed)
Pt returned CR phone call and stated he is interested in CR. Patient will come in for orientation on 03/16/22 @ 10AM and will attend the 10:30AM exercise class. ?  ?Tourist information centre manager. ?

## 2022-03-15 ENCOUNTER — Telehealth (HOSPITAL_COMMUNITY): Payer: Self-pay

## 2022-03-15 DIAGNOSIS — H43821 Vitreomacular adhesion, right eye: Secondary | ICD-10-CM | POA: Diagnosis not present

## 2022-03-15 DIAGNOSIS — H35371 Puckering of macula, right eye: Secondary | ICD-10-CM | POA: Diagnosis not present

## 2022-03-15 DIAGNOSIS — H43811 Vitreous degeneration, right eye: Secondary | ICD-10-CM | POA: Diagnosis not present

## 2022-03-15 DIAGNOSIS — E113313 Type 2 diabetes mellitus with moderate nonproliferative diabetic retinopathy with macular edema, bilateral: Secondary | ICD-10-CM | POA: Diagnosis not present

## 2022-03-16 ENCOUNTER — Encounter (HOSPITAL_COMMUNITY): Payer: Self-pay

## 2022-03-16 ENCOUNTER — Encounter (HOSPITAL_COMMUNITY)
Admission: RE | Admit: 2022-03-16 | Discharge: 2022-03-16 | Disposition: A | Discharge status: Home / Self Care | Payer: Medicare Other | Source: Ambulatory Visit | Attending: Cardiovascular Disease | Admitting: Cardiovascular Disease

## 2022-03-16 VITALS — BP 114/68 | HR 74 | Ht 71.75 in | Wt 223.5 lb

## 2022-03-16 DIAGNOSIS — Z951 Presence of aortocoronary bypass graft: Secondary | ICD-10-CM | POA: Diagnosis present

## 2022-03-16 DIAGNOSIS — E162 Hypoglycemia, unspecified: Secondary | ICD-10-CM | POA: Insufficient documentation

## 2022-03-16 NOTE — Progress Notes (Signed)
Cardiac Individual Treatment Plan ? ?Patient Details  ?Name: Jason Giles ?MRN: [id number] ?Date of Birth: 1946-04-23 ?Referring Provider:   ?Flowsheet Row CARDIAC REHAB PHASE II ORIENTATION from 03/16/2022 in [place]  ?Referring Provider Dr. Mertie Moores  ? ?  ? ? ?Initial Encounter Date:  ?Flowsheet Row CARDIAC REHAB PHASE II ORIENTATION from 03/16/2022 in [place]  ?Date 03/16/22  ? ?  ? ? 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 ?Patient's Home Medications on Admission: ? ?Current Outpatient Medications:  ?  acetaminophen (TYLENOL) 500 MG tablet, Take 500 mg by mouth every 6 (six) hours as needed for moderate pain., Disp: , Rfl:  ?  amiodarone (PACERONE) 200 MG tablet, Take 200 mg by mouth daily., Disp: , Rfl:  ?  aspirin 81 MG tablet, Take 81 mg by mouth daily., Disp: , Rfl:  ?  b complex vitamins capsule, Take 1 capsule by mouth daily., Disp: , Rfl:  ?  Docusate Sodium (COLACE PO), Take 1 capsule by mouth daily., Disp: , Rfl:  ?  fenofibrate micronized (LOFIBRA) 134 MG capsule, Take 134 mg by mouth daily before breakfast. , Disp: , Rfl:  ?  insulin aspart (NOVOLOG) 100 UNIT/ML injection, Inject into the skin daily., Disp: , Rfl:  ?  JARDIANCE 25 MG TABS tablet, Take 25 mg by mouth daily. , Disp: , Rfl: 3 ?  metFORMIN (GLUCOPHAGE) 850 MG tablet, Take 850 mg by mouth 2 (two) times daily with a meal., Disp: , Rfl:  ?  metoprolol tartrate (LOPRESSOR) 25 MG tablet, Take 1 tablet (25 mg total) by mouth 2 (two) times daily., Disp: 180 tablet, Rfl: 3 ?  multivitamin (ONE-A-DAY MEN'S) TABS tablet, Take 1 tablet by mouth daily., Disp: , Rfl:  ?  nitroGLYCERIN (NITROSTAT) 0.4 MG SL tablet, Place 1 tablet (0.4 mg total) under the tongue every 5 (five) minutes as needed for chest pain., Disp: 25 tablet, Rfl: 1 ?  polyethylene glycol (MIRALAX / GLYCOLAX) 17 g packet, Take 17 g by mouth daily as needed for moderate constipation., Disp: , Rfl:  ?  Polyvinyl  Alcohol-Povidone (REFRESH OP), Place 1 drop into both eyes daily as needed (dry eyes)., Disp: , Rfl:  ?  rosuvastatin (CRESTOR) 40 MG tablet, Take 40 mg by mouth daily., Disp: , Rfl:  ?  tobramycin (TOBREX) 0.3 % ophthalmic solution, Place 1 drop into both eyes See admin instructions. Instill 1 drop into both eyes daily for the day before, the day of, and the day after eye injections, Disp: , Rfl:  ?  Insulin Human (INSULIN PUMP) SOLN, Inject into the skin continuous. Using Novolog, Disp: , Rfl:  ?  traMADol (ULTRAM) 50 MG tablet, Take 1 tablet (50 mg total) by mouth every 4 (four) hours as needed for moderate pain. (Patient not taking: Reported on 03/09/2022), Disp: 30 tablet, Rfl: 0 ? ?Past Medical History: ?Past Medical History:  ?Diagnosis Date  ? Arthritis   ? Colon polyp   ? adenomatous  ? Coronary artery disease   ? post PTCA and stenting  ? CORONARY ATHEROSCLEROSIS NATIVE CORONARY ARTERY 12/21/2009  ? Diabetes mellitus   ? Diverticulosis   ? History of kidney stones   ? Hyperlipidemia   ? Hypertension   ? ? ?Tobacco Use: ?Social History  ? ?Tobacco Use  ?Smoking Status Former  ? Types: Cigarettes  ? Quit date: 11/13/1985  ? Years since quitting: 36.3  ?Smokeless Tobacco Never  ? ? ?  Labs: ?Review Flowsheet   ? ?  ?  Latest Ref Rng & Units 06/13/2019 12/16/2021 12/20/2021 12/21/2021  ?Labs for ITP Cardiac and Pulmonary Rehab  ?Cholestrol 0 - 200 mg/dL    56    ?LDL (calc) 0 - 99 mg/dL    19    ?HDL-C >40 mg/dL    25    ?Trlycerides <150 mg/dL    60    ?Hemoglobin A1c 4.8 - 5.6 % 7.7   6.3      ?PH, Arterial 7.350 - 7.450  7.427   7.305    ? 7.309    ? 7.305    ? 7.378    ? 7.440    ? 7.455    ? 7.436    ? 7.347     ?PCO2 arterial 32.0 - 48.0 mmHg  35.1   37.0    ? 39.5    ? 43.7    ? 36.2    ? 37.0    ? 37.1    ? 37.1    ? 44.9     ?Bicarbonate 20.0 - 28.0 mmol/L  22.7   18.2    ? 19.6    ? 21.9    ? 21.3    ? 25.1    ? 26.1    ? 25.0    ? 23.4    ? 24.6     ?TCO2 22 - 32 mmol/L   19    ? 21    ? 23    ? 22    ? 22    ?  27    ? 26    ? 27    ? 26    ? 26    ? 25    ? 26    ? 26    ? 25     ?Acid-base deficit 0.0 - 2.0 mmol/L  1.0   7.0    ? 6.0    ? 5.0    ? 3.0    ? 2.0    ? 1.0     ?O2 Saturation %  97.7   94.0    ? 97.0    ? 97.0    ? 96.0    ? 100.0    ? 100.0    ? 100.0    ? 82.0    ? 100.0     ?  ? ? Multiple values from one day are sorted in reverse-chronological order  ?  ?  ? ? ?Capillary Blood Glucose: ?Lab Results  ?Component Value Date  ? GLUCAP 125 (H) 12/27/2021  ? GLUCAP 152 (H) 12/26/2021  ? GLUCAP 238 (H) 12/26/2021  ? GLUCAP 252 (H) 12/26/2021  ? GLUCAP 236 (H) 12/26/2021  ? ? ? ?Exercise Target Goals: ?Exercise Program Goal: ?Individual exercise prescription set using results from initial 6 min walk test and THRR while considering  patient?s activity barriers and safety.  ? ?Exercise Prescription Goal: ?Starting with aerobic activity 30 plus minutes a day, 3 days per week for initial exercise prescription. Provide home exercise prescription and guidelines that participant acknowledges understanding prior to discharge. ? ?Activity Barriers & Risk Stratification: ? Activity Barriers & Cardiac Risk Stratification - 03/16/22 1129   ? ?  ? Activity Barriers & Cardiac Risk Stratification  ? Activity Barriers Back Problems;Balance Concerns;Deconditioning;History of Falls   ? Cardiac Risk Stratification High   ? ?  ?  ? ?  ? ? ?6 Minute Walk: ? 6 Minute  Walk   ? ? [place] Name 03/16/22 1126  ?  ?  ?  ? 6 Minute Walk  ? Phase Initial    ? Distance 1473 feet    ? Walk Time 6 minutes    ? # of Rest Breaks 0    ? MPH 2.79    ? METS 2.67    ? RPE 11    ? Perceived Dyspnea  0    ? VO2 Peak 9.35    ? Symptoms No    ? Resting HR 74 bpm    ? Resting BP 114/68    ? Resting Oxygen Saturation  97 %    ? Exercise Oxygen Saturation  during 6 min walk 98 %    ? Max Ex. HR 90 bpm    ? Max Ex. BP 142/56    ? 2 Minute Post BP 142/70  Post 2 mins 118/62    ? ?  ?  ? ?  ? ? ?Oxygen Initial Assessment: ? ? ?Oxygen Re-Evaluation: ? ? ?Oxygen  Discharge (Final Oxygen Re-Evaluation): ? ? ?Initial Exercise Prescription: ? Initial Exercise Prescription - 03/16/22 1100   ? ?  ? Date of Initial Exercise RX and Referring Provider  ? Date 03/16/22   ? Referring Provider Dr. Mertie Moores   ? Expected Discharge Date 05/12/21   ?  ? NuStep  ? Level 2   ? SPM 75   ? Minutes 15   ? METs 1.8   ?  ? Arm Ergometer  ? Level 1   ? RPM 55   ? Minutes 15   ? METs 1.8   ?  ? Prescription Details  ? Frequency (times per week) 3   ? Duration Progress to 30 minutes of continuous aerobic without signs/symptoms of physical distress   ?  ? Intensity  ? THRR 40-80% of Max Heartrate 60-114   ? Ratings of Perceived Exertion 11-13   ? Perceived Dyspnea 0-4   ?  ? Progression  ? Progression Continue progressive overload as per policy without signs/symptoms or physical distress.   ?  ? Resistance Training  ? Training Prescription Yes   ? Weight 3   ? Reps 10-15   ? ?  ?  ? ?  ? ? ?Perform Capillary Blood Glucose checks as needed. ? ?Exercise Prescription Changes: ? ? ?Exercise Comments: ? ? ?Exercise Goals and Review: ? ? Exercise Goals   ? ? [place] Name 03/16/22 1132  ?  ?  ?  ?  ?  ? Exercise Goals  ? Increase Physical Activity Yes      ? Intervention Provide advice, education, support and counseling about physical activity/exercise needs.;Develop an individualized exercise prescription for aerobic and resistive training based on initial evaluation findings, risk stratification, comorbidities and participant's personal goals.      ? Expected Outcomes Short Term: Attend rehab on a regular basis to increase amount of physical activity.;Long Term: Add in home exercise to make exercise part of routine and to increase amount of physical activity.;Long Term: Exercising regularly at least 3-5 days a week.      ? Increase Strength and Stamina Yes      ? Intervention Provide advice, education, support and counseling about physical activity/exercise needs.;Develop an individualized exercise  prescription for aerobic and resistive training based on initial evaluation findings, risk stratification, comorbidities and participant's personal goals.      ? Expected Outcomes Short Term: Increase workloads

## 2022-03-16 NOTE — Telephone Encounter (Signed)
Called placed to pt to confirm appt. L/M on V/M and pt returned called. Confirmed appt for 03/16/22 @ 9:00. ?

## 2022-03-16 NOTE — Progress Notes (Signed)
Cardiac Rehab Medication Review by a Nurse ? ?Does the patient  feel that his/her medications are working for him/her?  yes ? ?Has the patient been experiencing any side effects to the medications prescribed?  no ? ?Does the patient measure his/her own blood pressure or blood glucose at home?  yes  ? ?Does the patient have any problems obtaining medications due to transportation or finances?   no ? ?Understanding of regimen: good ?Understanding of indications: good ?Potential of compliance: good ? ? ? ?Nurse comments: Jason Giles is taking his medications as prescribed and has a good understanding of what his medications are for. Jason Giles is on an insulin pump and has a  CGM. Jason Giles just started taking his metformin twice a day. Jason Giles has a blood pressure cuff but does not check his CBG every day. ? ? ? ?[name] RN ?03/16/2022 2:00 PM ?  ?

## 2022-03-20 ENCOUNTER — Encounter (HOSPITAL_COMMUNITY)
Admission: RE | Admit: 2022-03-20 | Discharge: 2022-03-20 | Disposition: A | Discharge status: Home / Self Care | Payer: Medicare Other | Source: Ambulatory Visit | Attending: Cardiovascular Disease | Admitting: Cardiovascular Disease

## 2022-03-20 DIAGNOSIS — E162 Hypoglycemia, unspecified: Secondary | ICD-10-CM | POA: Diagnosis not present

## 2022-03-20 DIAGNOSIS — Z951 Presence of aortocoronary bypass graft: Secondary | ICD-10-CM | POA: Diagnosis not present

## 2022-03-20 NOTE — Progress Notes (Signed)
Cardiac Individual Treatment Plan ? ?Patient Details  ?Name: Jason Giles ?MRN: [id number] ?Date of Birth: 03-30-1946 ?Referring Provider:   ?Flowsheet Row CARDIAC REHAB PHASE II ORIENTATION from 03/16/2022 in [place] [facility]  ?Referring Provider Dr. Mertie Moores  ? ?  ? ? ?Initial Encounter Date:  ?Flowsheet Row CARDIAC REHAB PHASE II ORIENTATION from 03/16/2022 in [place]  ?Date 03/16/22  ? ?  ? ? 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 ?Patient's Home Medications on Admission: ? ?Current Outpatient Medications:  ?  acetaminophen (TYLENOL) 500 MG tablet, Take 500 mg by mouth every 6 (six) hours as needed for moderate pain., Disp: , Rfl:  ?  amiodarone (PACERONE) 200 MG tablet, Take 200 mg by mouth daily., Disp: , Rfl:  ?  aspirin 81 MG tablet, Take 81 mg by mouth daily., Disp: , Rfl:  ?  b complex vitamins capsule, Take 1 capsule by mouth daily., Disp: , Rfl:  ?  Docusate Sodium (COLACE PO), Take 1 capsule by mouth daily., Disp: , Rfl:  ?  fenofibrate micronized (LOFIBRA) 134 MG capsule, Take 134 mg by mouth daily before breakfast. , Disp: , Rfl:  ?  insulin aspart (NOVOLOG) 100 UNIT/ML injection, Inject into the skin daily., Disp: , Rfl:  ?  Insulin Human (INSULIN PUMP) SOLN, Inject into the skin continuous. Using Novolog, Disp: , Rfl:  ?  JARDIANCE 25 MG TABS tablet, Take 25 mg by mouth daily. , Disp: , Rfl: 3 ?  metFORMIN (GLUCOPHAGE) 850 MG tablet, Take 850 mg by mouth 2 (two) times daily with a meal., Disp: , Rfl:  ?  metoprolol tartrate (LOPRESSOR) 25 MG tablet, Take 1 tablet (25 mg total) by mouth 2 (two) times daily., Disp: 180 tablet, Rfl: 3 ?  multivitamin (ONE-A-DAY MEN'S) TABS tablet, Take 1 tablet by mouth daily., Disp: , Rfl:  ?  nitroGLYCERIN (NITROSTAT) 0.4 MG SL tablet, Place 1 tablet (0.4 mg total) under the tongue every 5 (five) minutes as needed for chest pain., Disp: 25 tablet, Rfl: 1 ?  polyethylene glycol (MIRALAX / GLYCOLAX) 17 g  packet, Take 17 g by mouth daily as needed for moderate constipation., Disp: , Rfl:  ?  Polyvinyl Alcohol-Povidone (REFRESH OP), Place 1 drop into both eyes daily as needed (dry eyes)., Disp: , Rfl:  ?  rosuvastatin (CRESTOR) 40 MG tablet, Take 40 mg by mouth daily., Disp: , Rfl:  ?  tobramycin (TOBREX) 0.3 % ophthalmic solution, Place 1 drop into both eyes See admin instructions. Instill 1 drop into both eyes daily for the day before, the day of, and the day after eye injections, Disp: , Rfl:  ?  traMADol (ULTRAM) 50 MG tablet, Take 1 tablet (50 mg total) by mouth every 4 (four) hours as needed for moderate pain. (Patient not taking: Reported on 03/09/2022), Disp: 30 tablet, Rfl: 0 ? ?Past Medical History: ?Past Medical History:  ?Diagnosis Date  ? Arthritis   ? Colon polyp   ? adenomatous  ? Coronary artery disease   ? post PTCA and stenting  ? CORONARY ATHEROSCLEROSIS NATIVE CORONARY ARTERY 12/21/2009  ? Diabetes mellitus   ? Diverticulosis   ? History of kidney stones   ? Hyperlipidemia   ? Hypertension   ? ? ?Tobacco Use: ?Social History  ? ?Tobacco Use  ?Smoking Status Former  ? Types: Cigarettes  ? Quit date: 11/13/1985  ? Years since quitting: 36.3  ?Smokeless Tobacco Never  ? ? ?  Labs: ?Review Flowsheet   ? ?  ?  Latest Ref Rng & Units 06/13/2019 12/16/2021 12/20/2021 12/21/2021  ?Labs for ITP Cardiac and Pulmonary Rehab  ?Cholestrol 0 - 200 mg/dL    56    ?LDL (calc) 0 - 99 mg/dL    19    ?HDL-C >40 mg/dL    25    ?Trlycerides <150 mg/dL    60    ?Hemoglobin A1c 4.8 - 5.6 % 7.7   6.3      ?PH, Arterial 7.350 - 7.450  7.427   7.305    ? 7.309    ? 7.305    ? 7.378    ? 7.440    ? 7.455    ? 7.436    ? 7.347     ?PCO2 arterial 32.0 - 48.0 mmHg  35.1   37.0    ? 39.5    ? 43.7    ? 36.2    ? 37.0    ? 37.1    ? 37.1    ? 44.9     ?Bicarbonate 20.0 - 28.0 mmol/L  22.7   18.2    ? 19.6    ? 21.9    ? 21.3    ? 25.1    ? 26.1    ? 25.0    ? 23.4    ? 24.6     ?TCO2 22 - 32 mmol/L   19    ? 21    ? 23    ? 22    ? 22    ?  27    ? 26    ? 27    ? 26    ? 26    ? 25    ? 26    ? 26    ? 25     ?Acid-base deficit 0.0 - 2.0 mmol/L  1.0   7.0    ? 6.0    ? 5.0    ? 3.0    ? 2.0    ? 1.0     ?O2 Saturation %  97.7   94.0    ? 97.0    ? 97.0    ? 96.0    ? 100.0    ? 100.0    ? 100.0    ? 82.0    ? 100.0     ?  ? ? Multiple values from one day are sorted in reverse-chronological order  ?  ?  ? ? ?Capillary Blood Glucose: ?Lab Results  ?Component Value Date  ? GLUCAP 125 (H) 12/27/2021  ? GLUCAP 152 (H) 12/26/2021  ? GLUCAP 238 (H) 12/26/2021  ? GLUCAP 252 (H) 12/26/2021  ? GLUCAP 236 (H) 12/26/2021  ? ? ? ?Exercise Target Goals: ?Exercise Program Goal: ?Individual exercise prescription set using results from initial 6 min walk test and THRR while considering  patient?s activity barriers and safety.  ? ?Exercise Prescription Goal: ?Initial exercise prescription builds to 30-45 minutes a day of aerobic activity, 2-3 days per week.  Home exercise guidelines will be given to patient during program as part of exercise prescription that the participant will acknowledge. ? ?Activity Barriers & Risk Stratification: ? Activity Barriers & Cardiac Risk Stratification - 03/16/22 1129   ? ?  ? Activity Barriers & Cardiac Risk Stratification  ? Activity Barriers Back Problems;Balance Concerns;Deconditioning;History of Falls   ? Cardiac Risk Stratification High   ? ?  ?  ? ?  ? ? ?  6 Minute Walk: ? 6 Minute Walk   ? ? Elizabeth Name 03/16/22 1126  ?  ?  ?  ? 6 Minute Walk  ? Phase Initial    ? Distance 1473 feet    ? Walk Time 6 minutes    ? # of Rest Breaks 0    ? MPH 2.79    ? METS 2.67    ? RPE 11    ? Perceived Dyspnea  0    ? VO2 Peak 9.35    ? Symptoms No    ? Resting HR 74 bpm    ? Resting BP 114/68    ? Resting Oxygen Saturation  97 %    ? Exercise Oxygen Saturation  during 6 min walk 98 %    ? Max Ex. HR 90 bpm    ? Max Ex. BP 142/56    ? 2 Minute Post BP 142/70  Post 2 mins 118/62    ? ?  ?  ? ?  ? ? ?Oxygen Initial Assessment: ? ? ?Oxygen  Re-Evaluation: ? ? ?Oxygen Discharge (Final Oxygen Re-Evaluation): ? ? ?Initial Exercise Prescription: ? Initial Exercise Prescription - 03/16/22 1100   ? ?  ? Date of Initial Exercise RX and Referring Provider  ? Date 03/16/22   ? Referring Provider Dr. Mertie Moores   ? Expected Discharge Date 05/12/21   ?  ? NuStep  ? Level 2   ? SPM 75   ? Minutes 15   ? METs 1.8   ?  ? Arm Ergometer  ? Level 1   ? RPM 55   ? Minutes 15   ? METs 1.8   ?  ? Prescription Details  ? Frequency (times per week) 3   ? Duration Progress to 30 minutes of continuous aerobic without signs/symptoms of physical distress   ?  ? Intensity  ? THRR 40-80% of Max Heartrate 60-114   ? Ratings of Perceived Exertion 11-13   ? Perceived Dyspnea 0-4   ?  ? Progression  ? Progression Continue progressive overload as per policy without signs/symptoms or physical distress.   ?  ? Resistance Training  ? Training Prescription Yes   ? Weight 3   ? Reps 10-15   ? ?  ?  ? ?  ? ? ?Perform Capillary Blood Glucose checks as needed. ? ?Exercise Prescription Changes: ? ? Exercise Prescription Changes   ? ? Steele Name 03/20/22 1029  ?  ?  ?  ?  ?  ? Response to Exercise  ? Blood Pressure (Admit) 110/70      ? Blood Pressure (Exercise) 150/64      ? Blood Pressure (Exit) 124/58      ? Heart Rate (Admit) 58 bpm      ? Heart Rate (Exercise) 99 bpm      ? Heart Rate (Exit) 62 bpm      ? Rating of Perceived Exertion (Exercise) 13      ? Symptoms None      ? Comments Off to a good start with exercise.      ? Duration Continue with 30 min of aerobic exercise without signs/symptoms of physical distress.      ? Intensity THRR unchanged      ?  ? Progression  ? Progression Continue to progress workloads to maintain intensity without signs/symptoms of physical distress.      ? Average METs 1.9      ?  ?  Resistance Training  ? Training Prescription Yes      ? Weight 3      ? Reps 10-15      ? Time 10 Minutes      ?  ? Interval Training  ? Interval Training No      ?  ? NuStep   ? Level 2      ? SPM 80      ? Minutes 15      ? METs 2.2      ?  ? Arm Ergometer  ? Level 1      ? Minutes 15      ? METs 1.6      ? ?  ?  ? ?  ? ? ?Exercise Comments: ? ? Exercise Comments   ? ? Leslie Name 03/20/22

## 2022-03-20 NOTE — Progress Notes (Signed)
Daily Session Note ? ?Patient Details  ?Name: Jason Giles ?MRN: [id number] ?Date of Birth: 1946/09/22 ?Referring Provider:   ?Flowsheet Row CARDIAC REHAB PHASE II ORIENTATION from 03/16/2022 in [place]  ?Referring Provider Dr. Mertie Moores  ? ?  ? ? ?Encounter Date: 03/20/2022 ? ?Check In: ? Session Check In - 03/20/22 1055   ? ?  ? Check-In  ? Supervising physician immediately available to respond to emergencies [facility] immediately available   ? Physician(s) Dr Karleen Hampshire   ? Location [facility]   ? Staff Present Barnet Pall, RN, BSN;Jetta Walker BS, ACSM EP-C, Exercise Physiologist;Olinty Un[place], MS, ACSM CEP, Exercise Physiologist;David Intel, MS, ACSM-CEP, CCRP, Exercise Physiologist;Carlette Wilber [place], Therapist, sports, BSN   ? Virtual Visit No   ? Medication changes reported     No   ? Fall or balance concerns reported    No   ? Tobacco Cessation No Change   ? Warm-up and Cool-down Performed as group-led instruction   ? Resistance Training Performed Yes   ? VAD Patient? No   ? PAD/SET Patient? No   ?  ? Pain Assessment  ? Currently in Pain? No/denies   ? Pain Score 0-No pain   ? Multiple Pain Sites No   ? ?  ?  ? ?  ? ? ?Capillary Blood Glucose: ?No results found for this or any previous visit (from the past 24 hour(s)). ? ? Exercise Prescription Changes - 03/20/22 1029   ? ?  ? Response to Exercise  ? Blood Pressure (Admit) 110/70   ? Blood Pressure (Exercise) 150/64   ? Blood Pressure (Exit) 124/58   ? Heart Rate (Admit) 58 bpm   ? Heart Rate (Exercise) 99 bpm   ? Heart Rate (Exit) 62 bpm   ? Rating of Perceived Exertion (Exercise) 13   ? Symptoms None   ? Comments Off to a good start with exercise.   ? Duration Continue with 30 min of aerobic exercise without signs/symptoms of physical distress.   ? Intensity THRR unchanged   ?  ? Progression  ? Progression Continue to progress workloads to maintain intensity without signs/symptoms of physical distress.   ?  Average METs 1.9   ?  ? Resistance Training  ? Training Prescription Yes   ? Weight 3   ? Reps 10-15   ? Time 10 Minutes   ?  ? Interval Training  ? Interval Training No   ?  ? NuStep  ? Level 2   ? SPM 80   ? Minutes 15   ? METs 2.2   ?  ? Arm Ergometer  ? Level 1   ? Minutes 15   ? METs 1.6   ? ?  ?  ? ?  ? ? ?Social History  ? ?Tobacco Use  ?Smoking Status Former  ? Types: Cigarettes  ? Quit date: 11/13/1985  ? Years since quitting: 36.3  ?Smokeless Tobacco Never  ? ? ?Goals Met:  ?Exercise tolerated well ?No report of concerns or symptoms today ?Strength training completed today ? ?Goals Unmet:  ?Not Applicable ? ?Comments: [name] started cardiac rehab today.  Pt tolerated light exercise without difficulty. VSS, telemetry-Sinus Rhythm, first degree heart block, asymptomatic.  Medication list reconciled. Pt denies barriers to medicaiton compliance.  PSYCHOSOCIAL ASSESSMENT:  PHQ-0. Pt exhibits positive coping skills, hopeful outlook with supportive family. No psychosocial needs identified at this time, no psychosocial interventions necessary.    Pt enjoys  spending time with family and yardwork.   Pt oriented to exercise equipment and routine.    Understanding verbalized. Used CBG's from the patient's CGM and he is on an insulin pump and did not want me to check his blood sugars with the hospital meter. Patient said that his primary care physician Dr Al Corpus said not use hospital meter to check CBG.[name] RN BSN  ? ? ?Dr. Fransico Him is Medical Director for Cardiac Rehab at [facility]. ?

## 2022-03-22 ENCOUNTER — Encounter (HOSPITAL_COMMUNITY)
Admission: RE | Admit: 2022-03-22 | Discharge: 2022-03-22 | Disposition: A | Discharge status: Home / Self Care | Payer: Medicare Other | Source: Ambulatory Visit | Attending: Cardiovascular Disease | Admitting: Cardiovascular Disease

## 2022-03-22 DIAGNOSIS — Z951 Presence of aortocoronary bypass graft: Secondary | ICD-10-CM | POA: Diagnosis not present

## 2022-03-22 DIAGNOSIS — E162 Hypoglycemia, unspecified: Secondary | ICD-10-CM | POA: Diagnosis not present

## 2022-03-22 LAB — GLUCOSE, CAPILLARY
Glucose-Capillary: 147 mg/dL — ABNORMAL HIGH (ref 70–99)
Glucose-Capillary: 66 mg/dL — ABNORMAL LOW (ref 70–99)

## 2022-03-22 NOTE — Progress Notes (Signed)
CBG 209 before exercise at cardiac rehab. CBG 89 post exercise the 69 via his CGM. Patient asymptomatic. Patient was given ginger ale and a graham cracker. Recheck 66 via hospital meter. Patient given glucose gel. Patient asymptomatic. Patient suspended his insulin pump. Dr Loren Racer office was called and notified about low CBG. Repeat CBG was 147. Jason Giles had no symptoms upon exit from cardiac rehab. Patient's wife was also notified.Will continue to monitor the patient throughout  the program. Barnet Pall, RN,BSN ?03/22/2022 12:09 PM  ?

## 2022-03-24 ENCOUNTER — Encounter (HOSPITAL_COMMUNITY)
Admission: RE | Admit: 2022-03-24 | Discharge: 2022-03-24 | Disposition: A | Discharge status: Home / Self Care | Payer: Medicare Other | Source: Ambulatory Visit | Attending: Cardiovascular Disease | Admitting: Cardiovascular Disease

## 2022-03-24 DIAGNOSIS — Z951 Presence of aortocoronary bypass graft: Secondary | ICD-10-CM | POA: Diagnosis not present

## 2022-03-24 DIAGNOSIS — E162 Hypoglycemia, unspecified: Secondary | ICD-10-CM | POA: Diagnosis not present

## 2022-03-24 MED FILL — Glucose Gel 40%: ORAL | Qty: 1 | Status: AC

## 2022-03-24 NOTE — Progress Notes (Addendum)
Jason Giles 76 y.o. male ?Nutrition Note ?Jason Giles is motivated to make lifestyle changes to aid with cardiac rehab. Patient has medical history of HTN, CAD, Orthostatic hypotension, DM2, hyperlipidemia, s/p CABGx3.  [place] manages his diabetes; per notes, " he is cutting bolus to prevent hypoglycemia ?Basal change: 12a 2.0, 8a 2.5 ?Bolus change: ICR: 6, ISF 20". ?He lives at home with his wife. She does a lot of the grocery shopping and cooking and also helps with managing insulin. He reports blood sugar lows during the night and during exercise. He suffered blood sugar low on first day of exercise- see RN notes. I suspect he is over eating carbohydrates related to fear of lows.  Today, for exercise, he suspended Bolus insulin and reports Basal 1.0, 2.0. He also included protein at breakfast as we instructed- he did not have blood sugar low with these changes. He is also motivated to work on weight loss. ? ?Labs: A1c (01/02/22) 6.3, GFR 53.7, HDL 33 ? ?RMR: 6010 ? 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 ?Nutrition Intervention ?Pt?s individual nutrition plan reviewed with pt. ?Benefits of adopting Heart Healthy diet discussed.   ?Continue client-centered nutrition education by RD, as part of interdisciplinary care. ? ?Monitor/Evaluation: ?Patient reports motivation to make lifestyle changes for adherence to heart healthy diet recommendation, blood sugar control, and weight management. We discussed the plate method as a guide for meal planning, fiber intake, pairing protein + carbohydrates together, and reading food labels for sugar. Handouts/notes given. Patient amicable to RD suggestions and verbalizes understanding. Will follow-up as needed.  ? ?15 minutes spent in review of topics related to a heart healthy diet including sodium intake, blood sugar control, weight management, and fiber intake. ? ?Goal(s) ?Use the plate method as a guide  for meal planning. ?Pair protein with carbohydrates at meals and snacks  ?Look for products with <7g added sugar (yogurt, bars, etc) ?Pt to identify and limit food sources of saturated fat, trans fat, refined carbohydrates and sodium ?Pt to identify food quantities necessary to achieve weight loss of 6-24 lb at graduation from cardiac rehab.  ?Pt able to name foods that affect blood glucose. Continue to limit simple sugars, refined carbohydrates, sugary beverages, etc.  ?Pt to describe the benefit of including lean protein/plant proteins, fruits, vegetables, whole grains, nuts/seeds, and low-fat dairy products in a heart healthy meal plan. ?Pt to practice mindful and intuitive eating exercises ? ?Plan:  ?Will provide client-centered nutrition education as part of interdisciplinary care ?Monitor and evaluate progress toward nutrition goal with team. ? ? ?Aldona Bar [place], MS, RDN, LDN ? ?

## 2022-03-27 ENCOUNTER — Encounter (HOSPITAL_COMMUNITY)
Admission: RE | Admit: 2022-03-27 | Discharge: 2022-03-27 | Disposition: A | Discharge status: Home / Self Care | Payer: Medicare Other | Source: Ambulatory Visit | Attending: Cardiovascular Disease | Admitting: Cardiovascular Disease

## 2022-03-27 DIAGNOSIS — E162 Hypoglycemia, unspecified: Secondary | ICD-10-CM

## 2022-03-27 DIAGNOSIS — Z951 Presence of aortocoronary bypass graft: Secondary | ICD-10-CM

## 2022-03-29 ENCOUNTER — Encounter (HOSPITAL_COMMUNITY)
Admission: RE | Admit: 2022-03-29 | Discharge: 2022-03-29 | Disposition: A | Discharge status: Home / Self Care | Payer: Medicare Other | Source: Ambulatory Visit | Attending: Cardiovascular Disease | Admitting: Cardiovascular Disease

## 2022-03-29 DIAGNOSIS — E162 Hypoglycemia, unspecified: Secondary | ICD-10-CM | POA: Diagnosis not present

## 2022-03-29 DIAGNOSIS — Z951 Presence of aortocoronary bypass graft: Secondary | ICD-10-CM | POA: Diagnosis not present

## 2022-03-31 ENCOUNTER — Encounter (HOSPITAL_COMMUNITY)
Admission: RE | Admit: 2022-03-31 | Discharge: 2022-03-31 | Disposition: A | Discharge status: Home / Self Care | Payer: Medicare Other | Source: Ambulatory Visit | Attending: Cardiovascular Disease | Admitting: Cardiovascular Disease

## 2022-03-31 DIAGNOSIS — Z951 Presence of aortocoronary bypass graft: Secondary | ICD-10-CM

## 2022-03-31 DIAGNOSIS — E162 Hypoglycemia, unspecified: Secondary | ICD-10-CM | POA: Diagnosis not present

## 2022-04-03 ENCOUNTER — Encounter (HOSPITAL_COMMUNITY)
Admission: RE | Admit: 2022-04-03 | Discharge: 2022-04-03 | Disposition: A | Discharge status: Home / Self Care | Payer: Medicare Other | Source: Ambulatory Visit | Attending: Cardiovascular Disease | Admitting: Cardiovascular Disease

## 2022-04-03 DIAGNOSIS — Z951 Presence of aortocoronary bypass graft: Secondary | ICD-10-CM

## 2022-04-03 DIAGNOSIS — E162 Hypoglycemia, unspecified: Secondary | ICD-10-CM | POA: Diagnosis not present

## 2022-04-05 ENCOUNTER — Encounter (HOSPITAL_COMMUNITY)
Admission: RE | Admit: 2022-04-05 | Discharge: 2022-04-05 | Disposition: A | Discharge status: Home / Self Care | Payer: Medicare Other | Source: Ambulatory Visit | Attending: Cardiovascular Disease | Admitting: Cardiovascular Disease

## 2022-04-05 DIAGNOSIS — Z794 Long term (current) use of insulin: Secondary | ICD-10-CM | POA: Diagnosis not present

## 2022-04-05 DIAGNOSIS — Z951 Presence of aortocoronary bypass graft: Secondary | ICD-10-CM

## 2022-04-05 DIAGNOSIS — E119 Type 2 diabetes mellitus without complications: Secondary | ICD-10-CM | POA: Diagnosis not present

## 2022-04-05 DIAGNOSIS — E162 Hypoglycemia, unspecified: Secondary | ICD-10-CM

## 2022-04-05 DIAGNOSIS — E11319 Type 2 diabetes mellitus with unspecified diabetic retinopathy without macular edema: Secondary | ICD-10-CM | POA: Diagnosis not present

## 2022-04-05 DIAGNOSIS — E1165 Type 2 diabetes mellitus with hyperglycemia: Secondary | ICD-10-CM | POA: Diagnosis not present

## 2022-04-05 NOTE — Progress Notes (Signed)
Reviewed home exercise guidelines with patient including endpoints, temperature precautions, target heart rate and rate of perceived exertion. [facility], Thursday, and some Saturdays as his mode of home exercise. Patient started doing the stretching exercises this week and has 3 and 5 lb weights for his resistance training. Patient has a Kardia heart rate monitor to check his heart rate and rhythm. Patient voices understanding of instructions given.  Sol Passer, MS, ACSM CEP

## 2022-04-07 ENCOUNTER — Encounter (HOSPITAL_COMMUNITY)
Admission: RE | Admit: 2022-04-07 | Discharge: 2022-04-07 | Disposition: A | Discharge status: Home / Self Care | Payer: Medicare Other | Source: Ambulatory Visit | Attending: Cardiovascular Disease | Admitting: Cardiovascular Disease

## 2022-04-07 DIAGNOSIS — Z951 Presence of aortocoronary bypass graft: Secondary | ICD-10-CM | POA: Diagnosis not present

## 2022-04-07 DIAGNOSIS — E162 Hypoglycemia, unspecified: Secondary | ICD-10-CM | POA: Diagnosis not present

## 2022-04-12 ENCOUNTER — Encounter (HOSPITAL_COMMUNITY)
Admission: RE | Admit: 2022-04-12 | Discharge: 2022-04-12 | Disposition: A | Discharge status: Home / Self Care | Payer: Medicare Other | Source: Ambulatory Visit | Attending: Cardiovascular Disease | Admitting: Cardiovascular Disease

## 2022-04-12 DIAGNOSIS — E162 Hypoglycemia, unspecified: Secondary | ICD-10-CM | POA: Diagnosis not present

## 2022-04-12 DIAGNOSIS — Z951 Presence of aortocoronary bypass graft: Secondary | ICD-10-CM | POA: Diagnosis not present

## 2022-04-14 ENCOUNTER — Encounter (HOSPITAL_COMMUNITY): Payer: Medicare Other

## 2022-04-17 ENCOUNTER — Encounter (HOSPITAL_COMMUNITY): Payer: Medicare Other

## 2022-04-17 NOTE — Progress Notes (Signed)
Cardiac Individual Treatment Plan  Patient Details  Name: JOHATHAN PROVINCE MRN: [id number] Date of Birth: [birth year] Referring Provider:   Flowsheet Row CARDIAC REHAB PHASE II ORIENTATION from 03/16/2022 in [facility]  Referring Provider Dr. 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  Hypoglycemia  Patient's Home Medications on Admission:  Current Outpatient Medications:    acetaminophen (TYLENOL) 500 MG tablet, Take 500 mg by mouth every 6 (six) hours as needed for moderate pain., Disp: , Rfl:    amiodarone (PACERONE) 200 MG tablet, Take 200 mg by mouth daily., Disp: , Rfl:    aspirin 81 MG tablet, Take 81 mg by mouth daily., Disp: , Rfl:    b complex vitamins capsule, Take 1 capsule by mouth daily., Disp: , Rfl:    Docusate Sodium (COLACE PO), Take 1 capsule by mouth daily., Disp: , Rfl:    fenofibrate micronized (LOFIBRA) 134 MG capsule, Take 134 mg by mouth daily before breakfast. , Disp: , Rfl:    insulin aspart (NOVOLOG) 100 UNIT/ML injection, Inject into the skin daily., Disp: , Rfl:    Insulin Human (INSULIN PUMP) SOLN, Inject into the skin continuous. Using Novolog, Disp: , Rfl:    JARDIANCE 25 MG TABS tablet, Take 25 mg by mouth daily. , Disp: , Rfl: 3   metFORMIN (GLUCOPHAGE) 850 MG tablet, Take 850 mg by mouth 2 (two) times daily with a meal., Disp: , Rfl:    metoprolol tartrate (LOPRESSOR) 25 MG tablet, Take 1 tablet (25 mg total) by mouth 2 (two) times daily., Disp: 180 tablet, Rfl: 3   multivitamin (ONE-A-DAY MEN'S) TABS tablet, Take 1 tablet by mouth daily., Disp: , Rfl:    nitroGLYCERIN (NITROSTAT) 0.4 MG SL tablet, Place 1 tablet (0.4 mg total) under the tongue every 5 (five) minutes as needed for chest pain., Disp: 25 tablet, Rfl: 1   polyethylene glycol (MIRALAX /  GLYCOLAX) 17 g packet, Take 17 g by mouth daily as needed for moderate constipation., Disp: , Rfl:    Polyvinyl Alcohol-Povidone (REFRESH OP), Place 1 drop into both eyes daily as needed (dry eyes)., Disp: , Rfl:    rosuvastatin (CRESTOR) 40 MG tablet, Take 40 mg by mouth daily., Disp: , Rfl:    tobramycin (TOBREX) 0.3 % ophthalmic solution, Place 1 drop into both eyes See admin instructions. Instill 1 drop into both eyes daily for the day before, the day of, and the day after eye injections, Disp: , Rfl:    traMADol (ULTRAM) 50 MG tablet, Take 1 tablet (50 mg total) by mouth every 4 (four) hours as needed for moderate pain. (Patient not taking: Reported on 03/09/2022), Disp: 30 tablet, Rfl: 0  Past Medical History: Past Medical History:  Diagnosis Date   Arthritis    Colon polyp    adenomatous   Coronary artery disease    post PTCA and stenting   CORONARY ATHEROSCLEROSIS NATIVE CORONARY ARTERY 12/21/2009   Diabetes mellitus    Diverticulosis    History of kidney stones    Hyperlipidemia    Hypertension     Tobacco Use: Social History   Tobacco Use  Smoking Status Former   Types: Cigarettes   Quit date: 11/13/1985   Years since quitting: 36.4  Smokeless Tobacco Never  Labs: Review Flowsheet        Latest Ref Rng & Units 06/13/2019 12/16/2021 12/20/2021 12/21/2021  Labs for ITP Cardiac and Pulmonary Rehab  Cholestrol 0 - 200 mg/dL    56    LDL (calc) 0 - 99 mg/dL    19    HDL-C >40 mg/dL    25    Trlycerides <150 mg/dL    60    Hemoglobin A1c 4.8 - 5.6 % 7.7   6.3      PH, Arterial 7.350 - 7.450  7.427   7.305     7.309     7.305     7.378     7.440     7.455     7.436     7.347     PCO2 arterial 32.0 - 48.0 mmHg  35.1   37.0     39.5     43.7     36.2     37.0     37.1     37.1     44.9     Bicarbonate 20.0 - 28.0 mmol/L  22.7   18.2     19.6     21.9     21.3     25.1     26.1     25.0     23.4     24.6     TCO2 22 - 32 mmol/L   '19     21     23     22      22     27     26     27     26     26     25     26     26     25     '$ Acid-base deficit 0.0 - 2.0 mmol/L  1.0   7.0     6.0     5.0     3.0     2.0     1.0     O2 Saturation %  97.7   94.0     97.0     97.0     96.0     100.0     100.0     100.0     82.0     100.0         Multiple values from one day are sorted in reverse-chronological order        Capillary Blood Glucose: Lab Results  Component Value Date   GLUCAP 147 (H) 03/22/2022   GLUCAP 66 (L) 03/22/2022   GLUCAP 125 (H) 12/27/2021   GLUCAP 152 (H) 12/26/2021   GLUCAP 238 (H) 12/26/2021     Exercise Target Goals: Exercise Program Goal: Individual exercise prescription set using results from initial 6 min walk test and THRR while considering  patient's activity barriers and safety.   Exercise Prescription Goal: Initial exercise prescription builds to 30-45 minutes a day of aerobic activity, 2-3 days per week.  Home exercise guidelines will be given to patient during program as part of exercise prescription that the participant will acknowledge.  Activity Barriers & Risk Stratification:  Activity Barriers & Cardiac Risk Stratification - 03/16/22 1129       Activity Barriers & Cardiac Risk Stratification   Activity Barriers Back Problems;Balance Concerns;Deconditioning;History of Falls    Cardiac Risk Stratification High  6 Minute Walk:  6 Minute Walk     Row Name 03/16/22 1126         6 Minute Walk   Phase Initial     Distance 1473 feet     Walk Time 6 minutes     # of Rest Breaks 0     MPH 2.79     METS 2.67     RPE 11     Perceived Dyspnea  0     VO2 Peak 9.35     Symptoms No     Resting HR 74 bpm     Resting BP 114/68     Resting Oxygen Saturation  97 %     Exercise Oxygen Saturation  during 6 min walk 98 %     Max Ex. HR 90 bpm     Max Ex. BP 142/56     2 Minute Post BP 142/70  Post 2 mins 118/62              Oxygen Initial Assessment:   Oxygen  Re-Evaluation:   Oxygen Discharge (Final Oxygen Re-Evaluation):   Initial Exercise Prescription:  Initial Exercise Prescription - 03/16/22 1100       Date of Initial Exercise RX and Referring Provider   Date 03/16/22    Referring Provider Dr. Mertie Moores    Expected Discharge Date 05/12/21      NuStep   Level 2    SPM 75    Minutes 15    METs 1.8      Arm Ergometer   Level 1    RPM 55    Minutes 15    METs 1.8      Prescription Details   Frequency (times per week) 3    Duration Progress to 30 minutes of continuous aerobic without signs/symptoms of physical distress      Intensity   THRR 40-80% of Max Heartrate 60-114    Ratings of Perceived Exertion 11-13    Perceived Dyspnea 0-4      Progression   Progression Continue progressive overload as per policy without signs/symptoms or physical distress.      Resistance Training   Training Prescription Yes    Weight 3    Reps 10-15             Perform Capillary Blood Glucose checks as needed.  Exercise Prescription Changes:   Exercise Prescription Changes     Row Name 03/20/22 1029 04/03/22 1016 04/12/22 1025         Response to Exercise   Blood Pressure (Admit) 110/70 128/78 132/80     Blood Pressure (Exercise) 150/64 98/62 114/60     Blood Pressure (Exit) 124/58 102/72 116/60     Heart Rate (Admit) 58 bpm 72 bpm 71 bpm     Heart Rate (Exercise) 99 bpm 84 bpm 85 bpm     Heart Rate (Exit) 62 bpm 76 bpm 71 bpm     Rating of Perceived Exertion (Exercise) '13 13 13     '$ Symptoms None None None     Comments Off to a good start with exercise. Increased WL on NuStep today. Increased WL on NuStep today.     Duration Continue with 30 min of aerobic exercise without signs/symptoms of physical distress. Continue with 30 min of aerobic exercise without signs/symptoms of physical distress. Continue with 30 min of aerobic exercise without signs/symptoms of physical distress.     Intensity THRR unchanged THRR  unchanged THRR unchanged  Progression   Progression Continue to progress workloads to maintain intensity without signs/symptoms of physical distress. Continue to progress workloads to maintain intensity without signs/symptoms of physical distress. Continue to progress workloads to maintain intensity without signs/symptoms of physical distress.     Average METs 1.9 2 2.4       Resistance Training   Training Prescription Yes Yes No  Relaxation day, no weights     Weight 3 3 --     Reps 10-15 10-15 --     Time 10 Minutes 10 Minutes --       Interval Training   Interval Training No No No       NuStep   Level '2 4 5     '$ SPM 80 80 85     Minutes '15 15 15     '$ METs 2.2 2.2 2.6       Arm Ergometer   Level 1 2 2.5     Minutes '15 15 15     '$ METs 1.6 1.8 2.2       Home Exercise Plan   Plans to continue exercise at -- -- [employer] [employer] (comment)  Exercising at [employer] and walking outdoors.     Frequency -- -- Add 3 additional days to program exercise sessions.     Initial Home Exercises Provided -- -- 04/05/22              Exercise Comments:   Exercise Comments     Row Name 03/20/22 1133 04/03/22 1105 04/05/22 1035       Exercise Comments Patient tolerated low intensity exercise well without symptoms. Reviewed METs and goals with patient. Reviewed home exercise, METs, and goals with patient.              Exercise Goals and Review:   Exercise Goals     Row Name 03/16/22 1132             Exercise Goals   Increase Physical Activity Yes       Intervention Provide advice, education, support and counseling about physical activity/exercise needs.;Develop an individualized exercise prescription for aerobic and resistive training based on initial evaluation findings, risk stratification, comorbidities and participant's personal goals.       Expected Outcomes Short Term: Attend rehab on a regular basis to increase amount of physical activity.;Long Term: Add in home exercise  to make exercise part of routine and to increase amount of physical activity.;Long Term: Exercising regularly at least 3-5 days a week.       Increase Strength and Stamina Yes       Intervention Provide advice, education, support and counseling about physical activity/exercise needs.;Develop an individualized exercise prescription for aerobic and resistive training based on initial evaluation findings, risk stratification, comorbidities and participant's personal goals.       Expected Outcomes Short Term: Increase workloads from initial exercise prescription for resistance, speed, and METs.;Short Term: Perform resistance training exercises routinely during rehab and add in resistance training at home;Long Term: Improve cardiorespiratory fitness, muscular endurance and strength as measured by increased METs and functional capacity (6MWT)       Able to understand and use rate of perceived exertion (RPE) scale Yes       Intervention Provide education and explanation on how to use RPE scale       Expected Outcomes Short Term: Able to use RPE daily in rehab to express subjective intensity level;Long Term:  Able to use RPE to guide intensity level when exercising  independently       Knowledge and understanding of Target Heart Rate Range (THRR) Yes       Intervention Provide education and explanation of THRR including how the numbers were predicted and where they are located for reference       Expected Outcomes Short Term: Able to state/look up THRR;Long Term: Able to use THRR to govern intensity when exercising independently;Short Term: Able to use daily as guideline for intensity in rehab       Understanding of Exercise Prescription Yes       Intervention Provide education, explanation, and written materials on patient's individual exercise prescription       Expected Outcomes Short Term: Able to explain program exercise prescription;Long Term: Able to explain home exercise prescription to exercise  independently                Exercise Goals Re-Evaluation :  Exercise Goals Re-Evaluation     Row Name 03/20/22 1133 04/03/22 1105 04/05/22 1035         Exercise Goal Re-Evaluation   Exercise Goals Review Increase Physical Activity;Able to understand and use rate of perceived exertion (RPE) scale;Increase Strength and Stamina Increase Physical Activity;Able to understand and use rate of perceived exertion (RPE) scale;Increase Strength and Stamina Increase Physical Activity;Able to understand and use rate of perceived exertion (RPE) scale;Increase Strength and Stamina;Able to check pulse independently;Knowledge and understanding of Target Heart Rate Range (THRR);Understanding of Exercise Prescription     Comments Patient able to understand and use RPE scale appropriately. Patient making gradual progress with exericse. Patient's goal is to have more energy to do yardwork/ ADLs. Patient has chronic back pain that affects activity level. [facility], Thursday, and some Saturdays as his mode of home exercise. Patient has to take rest breaks with walking due to chronic back pain. Patient started doing the stretching exercises this week and has 3 and 5 lb weights for his resistance training. Patient has a Kardia hear rate monitor to check his heart rate and rhythm. Patient is interested in weight loss. Discussed increasing duration to help with weight loss goals.     Expected Outcomes Progress workloads as tolerated to help increase strength and stamina. Continue to progress workloads as tolerated to help increase energy level. Patient will increase exercise duration at home to help achieve weight loss goals. Patient will increase WL on AE at next session.              Discharge Exercise Prescription (Final Exercise Prescription Changes):  Exercise Prescription Changes - 04/12/22 1025       Response to Exercise    Blood Pressure (Admit) 132/80    Blood Pressure (Exercise) 114/60    Blood Pressure (Exit) 116/60    Heart Rate (Admit) 71 bpm    Heart Rate (Exercise) 85 bpm    Heart Rate (Exit) 71 bpm    Rating of Perceived Exertion (Exercise) 13    Symptoms None    Comments Increased WL on NuStep today.    Duration Continue with 30 min of aerobic exercise without signs/symptoms of physical distress.    Intensity THRR unchanged      Progression   Progression Continue to progress workloads to maintain intensity without signs/symptoms of physical distress.    Average METs 2.4      Resistance Training   Training Prescription No   Relaxation day, no weights  Interval Training   Interval Training No      NuStep   Level 5    SPM 85    Minutes 15    METs 2.6      Arm Ergometer   Level 2.5    Minutes 15    METs 2.2      Home Exercise Plan   Plans to continue exercise at [employer] [employer] (comment)   [facility] and [facility].   Frequency Add 3 additional days to program exercise sessions.    Initial Home Exercises Provided 04/05/22             Nutrition:  Target Goals: Understanding of nutrition guidelines, daily intake of sodium '1500mg'$ , cholesterol '200mg'$ , calories 30% from fat and 7% or less from saturated fats, daily to have 5 or more servings of fruits and vegetables.  Biometrics:  Pre Biometrics - 03/16/22 1132       Pre Biometrics   Waist Circumference 42 inches    Hip Circumference 41 inches    Waist to Hip Ratio 1.02 %    Triceps Skinfold 22 mm    % Body Fat 31 %    Grip Strength 42 kg    Flexibility --   Pt unable to reach   Single Leg Stand 3 seconds              Nutrition Therapy Plan and Nutrition Goals:  Nutrition Therapy & Goals - 04/07/22 1106       Nutrition Therapy   Diet Heart Healthy/Carb Consistent    Drug/Food Interactions Statins/Certain Fruits      Personal Nutrition Goals   Nutrition Goal Patient to build a healthy plate  to include lean protein/plant protein, fruits, vegetables, whole grains, low fat dairy as part of heart healthy meal plan.    Personal Goal #2 Patient to understand and implement strategies for blood sugar control.   Protein + carbohydrates, high fiber carbohydrates sources, the plate method,etc.   Personal Goal #3 Patient to identify and limit food sources of sodium, refined carbohydrates, trans fat, and saturated fats    Comments Patient has eliminated bolus insulin due to low blood sugars. This has helped with reducing over consumption of simple carbohydrate sources at night and post-exercise. He is choosing more high fiber/high protein snack and breakfast combinations. Will continue to monitor blood sugar and weight loss.      Intervention Plan   Intervention Prescribe, educate and counsel regarding individualized specific dietary modifications aiming towards targeted core components such as weight, hypertension, lipid management, diabetes, heart failure and other comorbidities.    Expected Outcomes Short Term Goal: Understand basic principles of dietary content, such as calories, fat, sodium, cholesterol and nutrients.;Short Term Goal: A plan has been developed with personal nutrition goals set during dietitian appointment.;Long Term Goal: Adherence to prescribed nutrition plan.             Nutrition Assessments:  Nutrition Assessments - 03/21/22 0829       Rate Your Plate Scores   Pre Score 57            MEDIFICTS Score Key: ?70 Need to make dietary changes  40-70 Heart Healthy Diet ? 40 Therapeutic Level Cholesterol Diet   Flowsheet Row CARDIAC REHAB PHASE II EXERCISE from 03/20/2022 in [facility]  Picture Your Plate Total Score on Admission 57      Picture Your Plate Scores: <62 Unhealthy dietary pattern with much room for improvement.  41-50 Dietary pattern unlikely to meet recommendations for good health and room for improvement. 51-60  More healthful dietary pattern, with some room for improvement.  >60 Healthy dietary pattern, although there may be some specific behaviors that could be improved.    Nutrition Goals Re-Evaluation:  Nutrition Goals Re-Evaluation     [place] Name 04/07/22 1106             Goals   Current Weight 223 lb 15.8 oz (101.6 kg)                Nutrition Goals Re-Evaluation:  Nutrition Goals Re-Evaluation     [place] Name 04/07/22 1106             Goals   Current Weight 223 lb 15.8 oz (101.6 kg)                Nutrition Goals Discharge (Final Nutrition Goals Re-Evaluation):  Nutrition Goals Re-Evaluation - 04/07/22 1106       Goals   Current Weight 223 lb 15.8 oz (101.6 kg)             Psychosocial: Target Goals: Acknowledge presence or absence of significant depression and/or stress, maximize coping skills, provide positive support system. Participant is able to verbalize types and ability to use techniques and skills needed for reducing stress and depression.  Initial Review & Psychosocial Screening:  Initial Psych Review & Screening - 03/16/22 1024       Initial Review   Current issues with None Identified      Family Dynamics   Good Support System? Yes   Britt has his wife and 4 children for support     Barriers   Psychosocial barriers to participate in program There are no identifiable barriers or psychosocial needs.      Screening Interventions   Interventions Encouraged to exercise             Quality of Life Scores:  Quality of Life - 03/16/22 1149       Quality of Life   Select Quality of Life      Quality of Life Scores   Health/Function Pre 22.4 %    Socioeconomic Pre 24.88 %    Psych/Spiritual Pre 21.43 %    Family Pre 30 %    GLOBAL Pre 23.86 %            Scores of 19 and below usually indicate a poorer quality of life in these areas.  A difference of  2-3 points is a clinically meaningful difference.  A difference of 2-3  points in the total score of the Quality of Life Index has been associated with significant improvement in overall quality of life, self-image, physical symptoms, and general health in studies assessing change in quality of life.  PHQ-9: Review Flowsheet        03/16/2022 03/21/2016  Depression screen PHQ 2/9  Decreased Interest 0 0  Down, Depressed, Hopeless 0 0  PHQ - 2 Score 0 0         Interpretation of Total Score  Total Score Depression Severity:  1-4 = Minimal depression, 5-9 = Mild depression, 10-14 = Moderate depression, 15-19 = Moderately severe depression, 20-27 = Severe depression   Psychosocial Evaluation and Intervention:   Psychosocial Re-Evaluation:  Psychosocial Re-Evaluation     [place] Name 03/20/22 1628 04/17/22 1823           Psychosocial Re-Evaluation   Current issues with None Identified None Identified  Comments -- Unable to reassess patient has not been present since 04/12/22      Interventions Encouraged to attend Cardiac Rehabilitation for the exercise Encouraged to attend Cardiac Rehabilitation for the exercise      Continue Psychosocial Services  No Follow up required No Follow up required               Psychosocial Discharge (Final Psychosocial Re-Evaluation):  Psychosocial Re-Evaluation - 04/17/22 1823       Psychosocial Re-Evaluation   Current issues with None Identified    Comments Unable to reassess patient has not been present since 04/12/22    Interventions Encouraged to attend Cardiac Rehabilitation for the exercise    Continue Psychosocial Services  No Follow up required             Vocational Rehabilitation: Provide vocational rehab assistance to qualifying candidates.   Vocational Rehab Evaluation & Intervention:  Vocational Rehab - 03/16/22 1025       Initial Vocational Rehab Evaluation & Intervention   Assessment shows need for Vocational Rehabilitation No   Alcide is retired and does not need vocational rehab at  this time            Education: Education Goals: Education classes will be provided on a weekly basis, covering required topics. Participant will state understanding/return demonstration of topics presented.  Learning Barriers/Preferences:  Learning Barriers/Preferences - 03/16/22 1152       Learning Barriers/Preferences   Learning Barriers Sight;Exercise Concerns   balance concerns   Learning Preferences Audio;Group Instruction;Individual Instruction;Verbal Instruction             Education Topics: Count Your Pulse:  -Group instruction provided by verbal instruction, demonstration, patient participation and written materials to support subject.  Instructors address importance of being able to find your pulse and how to count your pulse when at home without a heart monitor.  Patients get hands on experience counting their pulse with staff help and individually.   Heart Attack, Angina, and Risk Factor Modification:  -Group instruction provided by verbal instruction, video, and written materials to support subject.  Instructors address signs and symptoms of angina and heart attacks.    Also discuss risk factors for heart disease and how to make changes to improve heart health risk factors.   Functional Fitness:  -Group instruction provided by verbal instruction, demonstration, patient participation, and written materials to support subject.  Instructors address safety measures for doing things around the house.  Discuss how to get up and down off the floor, how to pick things up properly, how to safely get out of a chair without assistance, and balance training.   Meditation and Mindfulness:  -Group instruction provided by verbal instruction, patient participation, and written materials to support subject.  Instructor addresses importance of mindfulness and meditation practice to help reduce stress and improve awareness.  Instructor also leads participants through a meditation  exercise.    Stretching for Flexibility and Mobility:  -Group instruction provided by verbal instruction, patient participation, and written materials to support subject.  Instructors lead participants through series of stretches that are designed to increase flexibility thus improving mobility.  These stretches are additional exercise for major muscle groups that are typically performed during regular warm up and cool down.   Hands Only CPR:  -Group verbal, video, and participation provides a basic overview of AHA guidelines for community CPR. Role-play of emergencies allow participants the opportunity to practice calling for help and chest compression technique with discussion  of AED use.   Hypertension: -Group verbal and written instruction that provides a basic overview of hypertension including the most recent diagnostic guidelines, risk factor reduction with self-care instructions and medication management.    Nutrition I class: Heart Healthy Eating:  -Group instruction provided by PowerPoint slides, verbal discussion, and written materials to support subject matter. The instructor gives an explanation and review of the Therapeutic Lifestyle Changes diet recommendations, which includes a discussion on lipid goals, dietary fat, sodium, fiber, plant stanol/sterol esters, sugar, and the components of a well-balanced, healthy diet.   Nutrition II class: Lifestyle Skills:  -Group instruction provided by PowerPoint slides, verbal discussion, and written materials to support subject matter. The instructor gives an explanation and review of label reading, grocery shopping for heart health, heart healthy recipe modifications, and ways to make healthier choices when eating out.   Diabetes Question & Answer:  -Group instruction provided by PowerPoint slides, verbal discussion, and written materials to support subject matter. The instructor gives an explanation and review of diabetes  co-morbidities, pre- and post-prandial blood glucose goals, pre-exercise blood glucose goals, signs, symptoms, and treatment of hypoglycemia and hyperglycemia, and foot care basics.   Diabetes Blitz:  -Group instruction provided by PowerPoint slides, verbal discussion, and written materials to support subject matter. The instructor gives an explanation and review of the physiology behind type 1 and type 2 diabetes, diabetes medications and rational behind using different medications, pre- and post-prandial blood glucose recommendations and Hemoglobin A1c goals, diabetes diet, and exercise including blood glucose guidelines for exercising safely.    Portion Distortion:  -Group instruction provided by PowerPoint slides, verbal discussion, written materials, and food models to support subject matter. The instructor gives an explanation of serving size versus portion size, changes in portions sizes over the last 20 years, and what consists of a serving from each food group.   Stress Management:  -Group instruction provided by verbal instruction, video, and written materials to support subject matter.  Instructors review role of stress in heart disease and how to cope with stress positively.     Exercising on Your Own:  -Group instruction provided by verbal instruction, power point, and written materials to support subject.  Instructors discuss benefits of exercise, components of exercise, frequency and intensity of exercise, and end points for exercise.  Also discuss use of nitroglycerin and activating EMS.  Review options of places to exercise outside of rehab.  Review guidelines for sex with heart disease.   Cardiac Drugs I:  -Group instruction provided by verbal instruction and written materials to support subject.  Instructor reviews cardiac drug classes: antiplatelets, anticoagulants, beta blockers, and statins.  Instructor discusses reasons, side effects, and lifestyle considerations for each  drug class.   Cardiac Drugs II:  -Group instruction provided by verbal instruction and written materials to support subject.  Instructor reviews cardiac drug classes: angiotensin converting enzyme inhibitors (ACE-I), angiotensin II receptor blockers (ARBs), nitrates, and calcium channel blockers.  Instructor discusses reasons, side effects, and lifestyle considerations for each drug class.   Anatomy and Physiology of the Circulatory System:  Group verbal and written instruction and models provide basic cardiac anatomy and physiology, with the coronary electrical and arterial systems. Review of: AMI, Angina, Valve disease, Heart Failure, Peripheral Artery Disease, Cardiac Arrhythmia, Pacemakers, and the ICD.   Other Education:  -Group or individual verbal, written, or video instructions that support the educational goals of the cardiac rehab program.   Holiday Eating Survival Tips:  -Group instruction provided  by PowerPoint slides, verbal discussion, and written materials to support subject matter. The instructor gives patients tips, tricks, and techniques to help them not only survive but enjoy the holidays despite the onslaught of food that accompanies the holidays.   Knowledge Questionnaire Score:  Knowledge Questionnaire Score - 03/16/22 1154       Knowledge Questionnaire Score   Pre Score 22/24             Core Components/Risk Factors/Patient Goals at Admission:  Personal Goals and Risk Factors at Admission - 03/16/22 1416       Core Components/Risk Factors/Patient Goals on Admission    Weight Management Yes;Weight Loss    Intervention Weight Management: Develop a combined nutrition and exercise program designed to reach desired caloric intake, while maintaining appropriate intake of nutrient and fiber, sodium and fats, and appropriate energy expenditure required for the weight goal.;Weight Management: Provide education and appropriate resources to help participant work on  and attain dietary goals.;Weight Management/Obesity: Establish reasonable short term and long term weight goals.;Obesity: Provide education and appropriate resources to help participant work on and attain dietary goals.    Diabetes Yes    Intervention Provide education about signs/symptoms and action to take for hypo/hyperglycemia.;Provide education about proper nutrition, including hydration, and aerobic/resistive exercise prescription along with prescribed medications to achieve blood glucose in normal ranges: Fasting glucose 65-99 mg/dL    Expected Outcomes Short Term: Participant verbalizes understanding of the signs/symptoms and immediate care of hyper/hypoglycemia, proper foot care and importance of medication, aerobic/resistive exercise and nutrition plan for blood glucose control.;Long Term: Attainment of HbA1C < 7%.    Hypertension Yes    Intervention Provide education on lifestyle modifcations including regular physical activity/exercise, weight management, moderate sodium restriction and increased consumption of fresh fruit, vegetables, and low fat dairy, alcohol moderation, and smoking cessation.;Monitor prescription use compliance.    Expected Outcomes Short Term: Continued assessment and intervention until BP is < 140/6m HG in hypertensive participants. < 130/829mHG in hypertensive participants with diabetes, heart failure or chronic kidney disease.;Long Term: Maintenance of blood pressure at goal levels.    Lipids Yes    Intervention Provide education and support for participant on nutrition & aerobic/resistive exercise along with prescribed medications to achieve LDL '70mg'$ , HDL >'40mg'$ .    Expected Outcomes Short Term: Participant states understanding of desired cholesterol values and is compliant with medications prescribed. Participant is following exercise prescription and nutrition guidelines.;Long Term: Cholesterol controlled with medications as prescribed, with individualized exercise  RX and with personalized nutrition plan. Value goals: LDL < '70mg'$ , HDL > 40 mg.             Core Components/Risk Factors/Patient Goals Review:   Goals and Risk Factor Review     Row Name 03/20/22 1629 04/17/22 1825           Core Components/Risk Factors/Patient Goals Review   Personal Goals Review Weight Management/Obesity;Hypertension;Lipids;Diabetes Weight Management/Obesity;Hypertension;Lipids;Diabetes      Review Amar started exercise at cardiac rehab on 03/20/22. Divit did well with exercise. Vital signs and CBG's were stable. Kweli has done well with exercise at cardiac rehab when in attendance. Vital signs and CBG's have been stable.      Expected Outcomes JoDawanill continue to participate in phase 2 cardiac rehab for exercise, nutrition and lifestyle modifications JoKinnethill continue to participate in phase 2 cardiac rehab for exercise, nutrition and lifestyle modifications               Core  Components/Risk Factors/Patient Goals at Discharge (Final Review):   Goals and Risk Factor Review - 04/17/22 1825       Core Components/Risk Factors/Patient Goals Review   Personal Goals Review Weight Management/Obesity;Hypertension;Lipids;Diabetes    Review Philmore has done well with exercise at cardiac rehab when in attendance. Vital signs and CBG's have been stable.    Expected Outcomes Mansa will continue to participate in phase 2 cardiac rehab for exercise, nutrition and lifestyle modifications             ITP Comments:  ITP Comments     Row Name 03/16/22 1013 03/20/22 1626 04/17/22 1821       ITP Comments Dr Fransico Him MD, Medical Director 30 Day ITP Review. Whyatt started exercise at cardiac rehab on 03/20/22. Samel did well with exercise. 30 Day ITP Review. Calden last day of exercise was on 04/12/22. Corwyn has done well with exercise when in attendance.              Comments: See ITP Comments

## 2022-04-19 ENCOUNTER — Encounter (HOSPITAL_COMMUNITY)
Admission: RE | Admit: 2022-04-19 | Discharge: 2022-04-19 | Disposition: A | Discharge status: Home / Self Care | Payer: Medicare Other | Source: Ambulatory Visit | Attending: Cardiovascular Disease | Admitting: Cardiovascular Disease

## 2022-04-19 DIAGNOSIS — E162 Hypoglycemia, unspecified: Secondary | ICD-10-CM | POA: Insufficient documentation

## 2022-04-19 DIAGNOSIS — Z951 Presence of aortocoronary bypass graft: Secondary | ICD-10-CM | POA: Insufficient documentation

## 2022-04-21 ENCOUNTER — Encounter (HOSPITAL_COMMUNITY)
Admission: RE | Admit: 2022-04-21 | Discharge: 2022-04-21 | Disposition: A | Discharge status: Home / Self Care | Payer: Medicare Other | Source: Ambulatory Visit | Attending: Cardiovascular Disease | Admitting: Cardiovascular Disease

## 2022-04-21 DIAGNOSIS — Z951 Presence of aortocoronary bypass graft: Secondary | ICD-10-CM

## 2022-04-21 DIAGNOSIS — E162 Hypoglycemia, unspecified: Secondary | ICD-10-CM

## 2022-04-24 ENCOUNTER — Encounter (HOSPITAL_COMMUNITY): Payer: Medicare Other

## 2022-04-24 ENCOUNTER — Encounter (HOSPITAL_COMMUNITY)
Admission: RE | Admit: 2022-04-24 | Discharge: 2022-04-24 | Disposition: A | Discharge status: Home / Self Care | Payer: Medicare Other | Source: Ambulatory Visit | Attending: Cardiovascular Disease | Admitting: Cardiovascular Disease

## 2022-04-24 DIAGNOSIS — E162 Hypoglycemia, unspecified: Secondary | ICD-10-CM | POA: Diagnosis not present

## 2022-04-24 DIAGNOSIS — Z951 Presence of aortocoronary bypass graft: Secondary | ICD-10-CM | POA: Diagnosis not present

## 2022-04-25 DIAGNOSIS — I2510 Atherosclerotic heart disease of native coronary artery without angina pectoris: Secondary | ICD-10-CM | POA: Diagnosis not present

## 2022-04-25 DIAGNOSIS — Z794 Long term (current) use of insulin: Secondary | ICD-10-CM | POA: Diagnosis not present

## 2022-04-25 DIAGNOSIS — E1129 Type 2 diabetes mellitus with other diabetic kidney complication: Secondary | ICD-10-CM | POA: Diagnosis not present

## 2022-04-25 DIAGNOSIS — E1165 Type 2 diabetes mellitus with hyperglycemia: Secondary | ICD-10-CM | POA: Diagnosis not present

## 2022-04-25 DIAGNOSIS — R413 Other amnesia: Secondary | ICD-10-CM | POA: Diagnosis not present

## 2022-04-26 ENCOUNTER — Encounter (HOSPITAL_COMMUNITY)
Admission: RE | Admit: 2022-04-26 | Discharge: 2022-04-26 | Disposition: A | Discharge status: Home / Self Care | Payer: Medicare Other | Source: Ambulatory Visit | Attending: Cardiovascular Disease | Admitting: Cardiovascular Disease

## 2022-04-26 ENCOUNTER — Encounter (HOSPITAL_COMMUNITY): Payer: Medicare Other

## 2022-04-26 DIAGNOSIS — Z951 Presence of aortocoronary bypass graft: Secondary | ICD-10-CM | POA: Diagnosis not present

## 2022-04-26 DIAGNOSIS — E162 Hypoglycemia, unspecified: Secondary | ICD-10-CM | POA: Diagnosis not present

## 2022-04-28 ENCOUNTER — Encounter (HOSPITAL_COMMUNITY): Payer: Medicare Other

## 2022-04-28 ENCOUNTER — Encounter (HOSPITAL_COMMUNITY)
Admission: RE | Admit: 2022-04-28 | Discharge: 2022-04-28 | Disposition: A | Discharge status: Home / Self Care | Payer: Medicare Other | Source: Ambulatory Visit | Attending: Cardiovascular Disease | Admitting: Cardiovascular Disease

## 2022-04-28 DIAGNOSIS — E162 Hypoglycemia, unspecified: Secondary | ICD-10-CM | POA: Diagnosis not present

## 2022-04-28 DIAGNOSIS — Z951 Presence of aortocoronary bypass graft: Secondary | ICD-10-CM | POA: Diagnosis not present

## 2022-04-29 DIAGNOSIS — E1129 Type 2 diabetes mellitus with other diabetic kidney complication: Secondary | ICD-10-CM | POA: Diagnosis not present

## 2022-05-01 ENCOUNTER — Encounter (HOSPITAL_COMMUNITY): Payer: Medicare Other

## 2022-05-01 ENCOUNTER — Encounter (HOSPITAL_COMMUNITY)
Admission: RE | Admit: 2022-05-01 | Discharge: 2022-05-01 | Disposition: A | Discharge status: Home / Self Care | Payer: Medicare Other | Source: Ambulatory Visit | Attending: Cardiovascular Disease | Admitting: Cardiovascular Disease

## 2022-05-01 DIAGNOSIS — Z951 Presence of aortocoronary bypass graft: Secondary | ICD-10-CM | POA: Diagnosis not present

## 2022-05-01 DIAGNOSIS — E162 Hypoglycemia, unspecified: Secondary | ICD-10-CM | POA: Diagnosis not present

## 2022-05-02 NOTE — Addendum Note (Signed)
Encounter addended by: Sol Passer on: 05/02/2022 7:36 AM  Actions taken: Flowsheet accepted

## 2022-05-03 ENCOUNTER — Encounter (HOSPITAL_COMMUNITY): Payer: Medicare Other

## 2022-05-03 ENCOUNTER — Encounter (HOSPITAL_COMMUNITY)
Admission: RE | Admit: 2022-05-03 | Discharge: 2022-05-03 | Disposition: A | Discharge status: Home / Self Care | Payer: Medicare Other | Source: Ambulatory Visit | Attending: Cardiovascular Disease | Admitting: Cardiovascular Disease

## 2022-05-03 DIAGNOSIS — Z951 Presence of aortocoronary bypass graft: Secondary | ICD-10-CM

## 2022-05-03 DIAGNOSIS — E162 Hypoglycemia, unspecified: Secondary | ICD-10-CM | POA: Diagnosis not present

## 2022-05-05 ENCOUNTER — Encounter (HOSPITAL_COMMUNITY): Payer: Medicare Other

## 2022-05-05 ENCOUNTER — Encounter (HOSPITAL_COMMUNITY)
Admission: RE | Admit: 2022-05-05 | Discharge: 2022-05-05 | Disposition: A | Discharge status: Home / Self Care | Payer: Medicare Other | Source: Ambulatory Visit | Attending: Cardiovascular Disease | Admitting: Cardiovascular Disease

## 2022-05-05 DIAGNOSIS — E162 Hypoglycemia, unspecified: Secondary | ICD-10-CM | POA: Diagnosis not present

## 2022-05-05 DIAGNOSIS — Z951 Presence of aortocoronary bypass graft: Secondary | ICD-10-CM | POA: Diagnosis not present

## 2022-05-08 ENCOUNTER — Encounter (HOSPITAL_COMMUNITY): Payer: Medicare Other

## 2022-05-08 ENCOUNTER — Encounter (HOSPITAL_COMMUNITY)
Admission: RE | Admit: 2022-05-08 | Discharge: 2022-05-08 | Disposition: A | Discharge status: Home / Self Care | Payer: Medicare Other | Source: Ambulatory Visit | Attending: Cardiovascular Disease | Admitting: Cardiovascular Disease

## 2022-05-08 DIAGNOSIS — Z951 Presence of aortocoronary bypass graft: Secondary | ICD-10-CM

## 2022-05-08 DIAGNOSIS — E162 Hypoglycemia, unspecified: Secondary | ICD-10-CM | POA: Diagnosis not present

## 2022-05-10 ENCOUNTER — Encounter (HOSPITAL_COMMUNITY)
Admission: RE | Admit: 2022-05-10 | Discharge: 2022-05-10 | Disposition: A | Discharge status: Home / Self Care | Payer: Medicare Other | Source: Ambulatory Visit | Attending: Cardiovascular Disease | Admitting: Cardiovascular Disease

## 2022-05-10 ENCOUNTER — Encounter (HOSPITAL_COMMUNITY): Payer: Medicare Other

## 2022-05-10 DIAGNOSIS — H43821 Vitreomacular adhesion, right eye: Secondary | ICD-10-CM | POA: Diagnosis not present

## 2022-05-10 DIAGNOSIS — H26491 Other secondary cataract, right eye: Secondary | ICD-10-CM | POA: Diagnosis not present

## 2022-05-10 DIAGNOSIS — Z951 Presence of aortocoronary bypass graft: Secondary | ICD-10-CM | POA: Diagnosis not present

## 2022-05-10 DIAGNOSIS — E113313 Type 2 diabetes mellitus with moderate nonproliferative diabetic retinopathy with macular edema, bilateral: Secondary | ICD-10-CM | POA: Diagnosis not present

## 2022-05-10 DIAGNOSIS — E162 Hypoglycemia, unspecified: Secondary | ICD-10-CM | POA: Diagnosis not present

## 2022-05-10 DIAGNOSIS — H35371 Puckering of macula, right eye: Secondary | ICD-10-CM | POA: Diagnosis not present

## 2022-05-10 LAB — GLUCOSE, CAPILLARY: Glucose-Capillary: 128 mg/dL — ABNORMAL HIGH (ref 70–99)

## 2022-05-10 NOTE — Progress Notes (Signed)
Cardiac Individual Treatment Plan  Patient Details  Name: Jason Giles MRN: [id number] Date of Birth: Jun 22, 1946 Referring Provider:   Flowsheet Row CARDIAC REHAB PHASE II ORIENTATION from 03/16/2022 in [facility]  Referring Provider Dr. 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 , Disp: , Rfl:    insulin aspart (NOVOLOG) 100 UNIT/ML injection, Inject into the skin daily., Disp: , Rfl:    Insulin Human (INSULIN PUMP) SOLN, Inject into the skin continuous. Using Novolog, Disp: , Rfl:    JARDIANCE 25 MG TABS tablet, Take 25 mg by mouth daily. , Disp: , Rfl: 3   metFORMIN (GLUCOPHAGE) 850 MG tablet, Take 850 mg by mouth 2 (two) times daily with a meal., Disp: , Rfl:    metoprolol tartrate (LOPRESSOR) 25 MG tablet, Take 1 tablet (25 mg total) by mouth 2 (two) times daily., Disp: 180 tablet, Rfl: 3   multivitamin (ONE-A-DAY MEN'S) TABS tablet, Take 1 tablet by mouth daily., Disp: , Rfl:    nitroGLYCERIN (NITROSTAT) 0.4 MG SL tablet, Place 1 tablet (0.4 mg total) under the tongue every 5 (five) minutes as needed for chest pain., Disp: 25 tablet, Rfl: 1   polyethylene glycol (MIRALAX / GLYCOLAX) 17 g  packet, Take 17 g by mouth daily as needed for moderate constipation., Disp: , Rfl:    Polyvinyl Alcohol-Povidone (REFRESH OP), Place 1 drop into both eyes daily as needed (dry eyes)., Disp: , Rfl:    rosuvastatin (CRESTOR) 40 MG tablet, Take 40 mg by mouth daily., Disp: , Rfl:    tobramycin (TOBREX) 0.3 % ophthalmic solution, Place 1 drop into both eyes See admin instructions. Instill 1 drop into both eyes daily for the day before, the day of, and the day after eye injections, Disp: , Rfl:    traMADol (ULTRAM) 50 MG tablet, Take 1 tablet (50 mg total) by mouth every 4 (four) hours as needed for moderate pain. (Patient not taking: Reported on 03/09/2022), Disp: 30 tablet, Rfl: 0  Past Medical History: Past Medical History:  Diagnosis Date   Arthritis    Colon polyp    adenomatous   Coronary artery disease    post PTCA and stenting   CORONARY ATHEROSCLEROSIS NATIVE CORONARY ARTERY 12/21/2009   Diabetes mellitus    Diverticulosis    History of kidney stones    Hyperlipidemia    Hypertension     Tobacco Use: Social History   Tobacco Use  Smoking Status Former   Types: Cigarettes   Quit date: 11/13/1985   Years since quitting: 36.5  Smokeless Tobacco Never  Labs: Review Flowsheet       Latest Ref Rng & Units 06/13/2019 12/16/2021 12/20/2021 12/21/2021  Labs for ITP Cardiac and Pulmonary Rehab  Cholestrol 0 - 200 mg/dL - - - 56   LDL (calc) 0 - 99 mg/dL - - - 19   HDL-C >40 mg/dL - - - 25   Trlycerides <150 mg/dL - - - 60   Hemoglobin A1c 4.8 - 5.6 % 7.7  6.3  - -  PH, Arterial 7.350 - 7.450 - 7.427  7.305  7.309  7.305  7.378  7.440  7.455  7.436  7.347  -  PCO2 arterial 32.0 - 48.0 mmHg - 35.1  37.0  39.5  43.7  36.2  37.0  37.1  37.1  44.9  -  Bicarbonate 20.0 - 28.0 mmol/L - 22.7  18.2  19.6  21.9  21.3  25.1  26.1  25.0  23.4  24.6  -  TCO2 22 - 32 mmol/L - - '19  21  23  22  22  27  26  27  26  26  25  26  26  25  '$ -  Acid-base deficit 0.0 - 2.0 mmol/L - 1.0  7.0  6.0  5.0  3.0   2.0  1.0  -  O2 Saturation % - 97.7  94.0  97.0  97.0  96.0  100.0  100.0  100.0  82.0  100.0  -    Capillary Blood Glucose: Lab Results  Component Value Date   GLUCAP 128 (H) 05/10/2022   GLUCAP 147 (H) 03/22/2022   GLUCAP 66 (L) 03/22/2022   GLUCAP 125 (H) 12/27/2021   GLUCAP 152 (H) 12/26/2021     Exercise Target Goals: Exercise Program Goal: Individual exercise prescription set using results from initial 6 min walk test and THRR while considering  patient's activity barriers and safety.   Exercise Prescription Goal: Initial exercise prescription builds to 30-45 minutes a day of aerobic activity, 2-3 days per week.  Home exercise guidelines will be given to patient during program as part of exercise prescription that the participant will acknowledge.  Activity Barriers & Risk Stratification:  Activity Barriers & Cardiac Risk Stratification - 03/16/22 1129       Activity Barriers & Cardiac Risk Stratification   Activity Barriers Back Problems;Balance Concerns;Deconditioning;History of Falls    Cardiac Risk Stratification High             6 Minute Walk:  6 Minute Walk     Row Name 03/16/22 1126         6 Minute Walk   Phase Initial     Distance 1473 feet     Walk Time 6 minutes     # of Rest Breaks 0     MPH 2.79     METS 2.67     RPE 11     Perceived Dyspnea  0     VO2 Peak 9.35     Symptoms No     Resting HR 74 bpm     Resting BP 114/68     Resting Oxygen Saturation  97 %     Exercise Oxygen Saturation  during 6 min walk 98 %     Max Ex. HR 90 bpm     Max Ex. BP 142/56     2 Minute Post BP 142/70  Post 2 mins 118/62              Oxygen Initial Assessment:  Oxygen Re-Evaluation:   Oxygen Discharge (Final Oxygen Re-Evaluation):   Initial Exercise Prescription:  Initial Exercise Prescription - 03/16/22 1100       Date of Initial Exercise RX and Referring Provider   Date 03/16/22    Referring Provider Dr. Mertie Moores    Expected  Discharge Date 05/12/21      NuStep   Level 2    SPM 75    Minutes 15    METs 1.8      Arm Ergometer   Level 1    RPM 55    Minutes 15    METs 1.8      Prescription Details   Frequency (times per week) 3    Duration Progress to 30 minutes of continuous aerobic without signs/symptoms of physical distress      Intensity   THRR 40-80% of Max Heartrate 60-114    Ratings of Perceived Exertion 11-13    Perceived Dyspnea 0-4      Progression   Progression Continue progressive overload as per policy without signs/symptoms or physical distress.      Resistance Training   Training Prescription Yes    Weight 3    Reps 10-15             Perform Capillary Blood Glucose checks as needed.  Exercise Prescription Changes:   Exercise Prescription Changes     Row Name 03/20/22 1029 04/03/22 1016 04/12/22 1025 05/01/22 1026       Response to Exercise   Blood Pressure (Admit) 110/70 128/78 132/80 124/72    Blood Pressure (Exercise) 150/64 98/62 114/60 128/66    Blood Pressure (Exit) 124/58 102/72 116/60 108/62    Heart Rate (Admit) 58 bpm 72 bpm 71 bpm 68 bpm    Heart Rate (Exercise) 99 bpm 84 bpm 85 bpm 82 bpm    Heart Rate (Exit) 62 bpm 76 bpm 71 bpm 71 bpm    Rating of Perceived Exertion (Exercise) '13 13 13 13    '$ Symptoms None None None None    Comments Off to a good start with exercise. Increased WL on NuStep today. Increased WL on NuStep today. --    Duration Continue with 30 min of aerobic exercise without signs/symptoms of physical distress. Continue with 30 min of aerobic exercise without signs/symptoms of physical distress. Continue with 30 min of aerobic exercise without signs/symptoms of physical distress. Continue with 30 min of aerobic exercise without signs/symptoms of physical distress.    Intensity THRR unchanged THRR unchanged THRR unchanged THRR unchanged      Progression   Progression Continue to progress workloads to maintain intensity without signs/symptoms  of physical distress. Continue to progress workloads to maintain intensity without signs/symptoms of physical distress. Continue to progress workloads to maintain intensity without signs/symptoms of physical distress. Continue to progress workloads to maintain intensity without signs/symptoms of physical distress.    Average METs 1.9 2 2.4 2.4      Resistance Training   Training Prescription Yes Yes No  Relaxation day, no weights Yes    Weight 3 3 -- 4 lbs    Reps 10-15 10-15 -- 10-15    Time 10 Minutes 10 Minutes -- 10 Minutes      Interval Training   Interval Training No No No No      NuStep   Level '2 4 5 5    '$ SPM 80 80 85 85    Minutes '15 15 15 15    '$ METs 2.2 2.2 2.6  2.3      Arm Ergometer   Level 1 2 2.5 4    Minutes '15 15 15 15    '$ METs 1.6 1.8 2.2 2.5      Home Exercise Plan   Plans to continue exercise at -- -- [employer] (comment)  Exercising at [employer] and walking outdoors. [employer] (comment)  Exercising at [employer] and walking outdoors.    Frequency -- -- Add 3 additional days to program exercise sessions. Add 3 additional days to program exercise sessions.    Initial Home Exercises Provided -- -- 04/05/22 04/05/22             Exercise Comments:   Exercise Comments     Row Name 03/20/22 1133 04/03/22 1105 04/05/22 1035 05/01/22 1103     Exercise Comments Patient tolerated low intensity exercise well without symptoms. Reviewed METs and goals with patient. Reviewed home exercise, METs, and goals with patient. Reviewed METs and goals with patient.             Exercise Goals and Review:   Exercise Goals     Row Name 03/16/22 1132             Exercise Goals   Increase Physical Activity Yes       Intervention Provide advice, education, support and counseling about physical activity/exercise needs.;Develop an individualized exercise prescription for aerobic and resistive training based on initial evaluation findings, risk stratification,  comorbidities and participant's personal goals.       Expected Outcomes Short Term: Attend rehab on a regular basis to increase amount of physical activity.;Long Term: Add in home exercise to make exercise part of routine and to increase amount of physical activity.;Long Term: Exercising regularly at least 3-5 days a week.       Increase Strength and Stamina Yes       Intervention Provide advice, education, support and counseling about physical activity/exercise needs.;Develop an individualized exercise prescription for aerobic and resistive training based on initial evaluation findings, risk stratification, comorbidities and participant's personal goals.       Expected Outcomes Short Term: Increase workloads from initial exercise prescription for resistance, speed, and METs.;Short Term: Perform resistance training exercises routinely during rehab and add in resistance training at home;Long Term: Improve cardiorespiratory fitness, muscular endurance and strength as measured by increased METs and functional capacity (6MWT)       Able to understand and use rate of perceived exertion (RPE) scale Yes       Intervention Provide education and explanation on how to use RPE scale       Expected Outcomes Short Term: Able to use RPE daily in rehab to express subjective intensity level;Long Term:  Able to use RPE to guide intensity level when exercising independently       Knowledge and understanding of Target Heart Rate Range (THRR) Yes       Intervention Provide education and explanation of THRR including how the numbers were predicted and where they are located for reference       Expected Outcomes Short Term: Able to state/look up THRR;Long Term: Able to use THRR to govern intensity when exercising independently;Short Term: Able to use daily as guideline for intensity in rehab       Understanding of Exercise Prescription Yes       Intervention Provide education, explanation, and written materials on patient's  individual exercise prescription       Expected Outcomes Short Term: Able to explain program exercise prescription;Long  Term: Able to explain home exercise prescription to exercise independently                Exercise Goals Re-Evaluation :  Exercise Goals Re-Evaluation     Row Name 03/20/22 1133 04/03/22 1105 04/05/22 1035 05/01/22 1103       Exercise Goal Re-Evaluation   Exercise Goals Review Increase Physical Activity;Able to understand and use rate of perceived exertion (RPE) scale;Increase Strength and Stamina Increase Physical Activity;Able to understand and use rate of perceived exertion (RPE) scale;Increase Strength and Stamina Increase Physical Activity;Able to understand and use rate of perceived exertion (RPE) scale;Increase Strength and Stamina;Able to check pulse independently;Knowledge and understanding of Target Heart Rate Range (THRR);Understanding of Exercise Prescription Increase Physical Activity;Able to understand and use rate of perceived exertion (RPE) scale;Increase Strength and Stamina;Able to check pulse independently;Knowledge and understanding of Target Heart Rate Range (THRR);Understanding of Exercise Prescription    Comments Patient able to understand and use RPE scale appropriately. Patient making gradual progress with exericse. Patient's goal is to have more energy to do yardwork/ ADLs. Patient has chronic back pain that affects activity level. [facility], Thursday, and some Saturdays as his mode of home exercise. Patient has to take rest breaks with walking due to chronic back pain. Patient started doing the stretching exercises this week and has 3 and 5 lb weights for his resistance training. Patient has a Kardia heart rate monitor to check his heart rate and rhythm. Patient is interested in weight loss. Discussed increasing duration to help with weight loss goals. [facility] that he doesn't attend cardiac rehab but has to take rest breaks. Patient doesn't enjoy walking but like using the recumbent stepper. Advised patient to use the SE versus walking the track if he prefers that modality, and patient is amenable to this.    Expected Outcomes Progress workloads as tolerated to help increase strength and stamina. Continue to progress workloads as tolerated to help increase energy level. Patient will increase exercise duration at home to help achieve weight loss goals. Patient will increase WL on AE at next session. [facility]'t [facility] re[facility] to help achieve personal health and fitness goals.             Discharge Exercise Prescription (Final Exercise Prescription Changes):  Exercise Prescription Changes - 05/01/22 1026       Response to Exercise   Blood Pressure (Admit) 124/72    Blood Pressure (Exercise) 128/66    Blood Pressure (Exit) 108/62    Heart Rate (Admit) 68 bpm    Heart Rate (Exercise) 82 bpm    Heart Rate (Exit) 71 bpm    Rating of Perceived Exertion (Exercise) 13    Symptoms None    Duration Continue with 30 min of aerobic exercise without signs/symptoms of physical distress.    Intensity THRR unchanged      Progression   Progression Continue to progress workloads to maintain intensity without signs/symptoms of physical distress.    Average METs 2.4      Resistance Training   Training Prescription Yes    Weight 4 lbs    Reps 10-15    Time 10 Minutes      Interval Training   Interval Training No      NuStep   Level  5    SPM 85    Minutes 15    METs 2.3      Arm Ergometer   Level 4    Minutes 15    METs 2.5      Home Exercise Plan   Plans to continue exercise at [employer] (comment)   [facility] and [facility].   Frequency Add 3 additional days to program exercise sessions.    Initial Home Exercises Provided 04/05/22              Nutrition:  Target Goals: Understanding of nutrition guidelines, daily intake of sodium '1500mg'$ , cholesterol '200mg'$ , calories 30% from fat and 7% or less from saturated fats, daily to have 5 or more servings of fruits and vegetables.  Biometrics:  Pre Biometrics - 03/16/22 1132       Pre Biometrics   Waist Circumference 42 inches    Hip Circumference 41 inches    Waist to Hip Ratio 1.02 %    Triceps Skinfold 22 mm    % Body Fat 31 %    Grip Strength 42 kg    Flexibility --   Pt unable to reach   Single Leg Stand 3 seconds              Nutrition Therapy Plan and Nutrition Goals:  Nutrition Therapy & Goals - 05/03/22 1106       Nutrition Therapy   Diet Heart Healthy/Carb Consistent    Drug/Food Interactions Statins/Certain Fruits      Personal Nutrition Goals   Nutrition Goal Patient to build a healthy plate to include lean protein/plant protein, fruits, vegetables, whole grains, low fat dairy as part of heart healthy meal plan.    Personal Goal #2 Patient to understand and implement strategies for blood sugar control.   Protein + carbohydrates, high fiber carbohydrates sources, the plate method,etc.   Personal Goal #3 Patient to identify and limit food sources of sodium, refined carbohydrates, trans fat, and saturated fats    Comments A1c is up to 7.0; however, PCP eliminated Bolus insulin and reduced basal insulin amount as patient is controlling blood sugar better with diet and exercise. He will follow-up with diabetes educator in the next two weeks.      Intervention Plan   Intervention Prescribe, educate and counsel regarding individualized specific dietary modifications aiming towards targeted core components such as weight, hypertension, lipid management, diabetes, heart failure and other comorbidities.    Expected Outcomes Short Term Goal: Understand basic principles of dietary content, such as calories, fat, sodium, cholesterol and nutrients.;Short Term  Goal: A plan has been developed with personal nutrition goals set during dietitian appointment.;Long Term Goal: Adherence to prescribed nutrition plan.             Nutrition Assessments:  Nutrition Assessments - 03/21/22 0829       Rate Your Plate Scores   Pre Score 57            MEDIFICTS Score Key: ?70 Need to make dietary changes  40-70 Heart Healthy Diet ? 40 Therapeutic Level Cholesterol Diet   Flowsheet Row CARDIAC REHAB PHASE II EXERCISE from 03/20/2022 in [facility]  Picture Your Plate Total Score on Admission 57      Picture Your Plate Scores: <56 Unhealthy dietary pattern with much room for improvement. 41-50 Dietary pattern unlikely to meet recommendations for good health and room for improvement. 51-60 More healthful dietary pattern, with some room for  improvement.  >60 Healthy dietary pattern, although there may be some specific behaviors that could be improved.    Nutrition Goals Re-Evaluation:  Nutrition Goals Re-Evaluation     [place] Name 04/07/22 1106 05/03/22 1106           Goals   Current Weight 223 lb 15.8 oz (101.6 kg) 224 lb 6.9 oz (101.8 kg)      Expected Outcome -- Goals in progress. A1c is up to 7.0; however, PCP eliminated Bolus insulin and reduced basal insulin amount as patient is controlling blood sugar better with diet and exercise. He will follow-up with diabetes educator in the next two weeks. His wife remains very supportive.               Nutrition Goals Re-Evaluation:  Nutrition Goals Re-Evaluation     [place] Name 04/07/22 1106 05/03/22 1106           Goals   Current Weight 223 lb 15.8 oz (101.6 kg) 224 lb 6.9 oz (101.8 kg)      Expected Outcome -- Goals in progress. A1c is up to 7.0; however, PCP eliminated Bolus insulin and reduced basal insulin amount as patient is controlling blood sugar better with diet and exercise. He will follow-up with diabetes educator in the next two weeks. His wife  remains very supportive.               Nutrition Goals Discharge (Final Nutrition Goals Re-Evaluation):  Nutrition Goals Re-Evaluation - 05/03/22 1106       Goals   Current Weight 224 lb 6.9 oz (101.8 kg)    Expected Outcome Goals in progress. A1c is up to 7.0; however, PCP eliminated Bolus insulin and reduced basal insulin amount as patient is controlling blood sugar better with diet and exercise. He will follow-up with diabetes educator in the next two weeks. His wife remains very supportive.             Psychosocial: Target Goals: Acknowledge presence or absence of significant depression and/or stress, maximize coping skills, provide positive support system. Participant is able to verbalize types and ability to use techniques and skills needed for reducing stress and depression.  Initial Review & Psychosocial Screening:  Initial Psych Review & Screening - 03/16/22 1024       Initial Review   Current issues with None Identified      Family Dynamics   Good Support System? Yes   Lewi has his wife and 4 children for support     Barriers   Psychosocial barriers to participate in program There are no identifiable barriers or psychosocial needs.      Screening Interventions   Interventions Encouraged to exercise             Quality of Life Scores:  Quality of Life - 03/16/22 1149       Quality of Life   Select Quality of Life      Quality of Life Scores   Health/Function Pre 22.4 %    Socioeconomic Pre 24.88 %    Psych/Spiritual Pre 21.43 %    Family Pre 30 %    GLOBAL Pre 23.86 %            Scores of 19 and below usually indicate a poorer quality of life in these areas.  A difference of  2-3 points is a clinically meaningful difference.  A difference of 2-3 points in the total score of the Quality of Life Index has been associated  with significant improvement in overall quality of life, self-image, physical symptoms, and general health in studies  assessing change in quality of life.  PHQ-9: Review Flowsheet       03/16/2022 03/21/2016  Depression screen PHQ 2/9  Decreased Interest 0 0  Down, Depressed, Hopeless 0 0  PHQ - 2 Score 0 0   Interpretation of Total Score  Total Score Depression Severity:  1-4 = Minimal depression, 5-9 = Mild depression, 10-14 = Moderate depression, 15-19 = Moderately severe depression, 20-27 = Severe depression   Psychosocial Evaluation and Intervention:   Psychosocial Re-Evaluation:  Psychosocial Re-Evaluation     [place] Name 03/20/22 1628 04/17/22 1823 05/09/22 1104         Psychosocial Re-Evaluation   Current issues with None Identified None Identified None Identified     Comments -- Unable to reassess patient has not been present since 04/12/22 --     Interventions Encouraged to attend Cardiac Rehabilitation for the exercise Encouraged to attend Cardiac Rehabilitation for the exercise Encouraged to attend Cardiac Rehabilitation for the exercise     Continue Psychosocial Services  No Follow up required No Follow up required No Follow up required              Psychosocial Discharge (Final Psychosocial Re-Evaluation):  Psychosocial Re-Evaluation - 05/09/22 1104       Psychosocial Re-Evaluation   Current issues with None Identified    Interventions Encouraged to attend Cardiac Rehabilitation for the exercise    Continue Psychosocial Services  No Follow up required             Vocational Rehabilitation: Provide vocational rehab assistance to qualifying candidates.   Vocational Rehab Evaluation & Intervention:  Vocational Rehab - 03/16/22 1025       Initial Vocational Rehab Evaluation & Intervention   Assessment shows need for Vocational Rehabilitation No   Jahaan is retired and does not need vocational rehab at this time            Education: Education Goals: Education classes will be provided on a weekly basis, covering required topics. Participant will state  understanding/return demonstration of topics presented.     Core Videos: Exercise    Move It!  Clinical staff conducted group or individual video education with verbal and written material and guidebook.  Patient learns the recommended Pritikin exercise program. Exercise with the goal of living a long, healthy life. Some of the health benefits of exercise include controlled diabetes, healthier blood pressure levels, improved cholesterol levels, improved heart and lung capacity, improved sleep, and better body composition. Everyone should speak with their doctor before starting or changing an exercise routine.  Biomechanical Limitations Clinical staff conducted group or individual video education with verbal and written material and guidebook.  Patient learns how biomechanical limitations can impact exercise and how we can mitigate and possibly overcome limitations to have an impactful and balanced exercise routine.  Body Composition Clinical staff conducted group or individual video education with verbal and written material and guidebook.  Patient learns that body composition (ratio of muscle mass to fat mass) is a key component to assessing overall fitness, rather than body weight alone. Increased fat mass, especially visceral belly fat, can put [place] at increased risk for metabolic syndrome, type 2 diabetes, heart disease, and even death. It is recommended to combine diet and exercise (cardiovascular and resistance training) to improve your body composition. Seek guidance from your physician and exercise physiologist before implementing an exercise routine.  Exercise  Action Plan Clinical staff conducted group or individual video education with verbal and written material and guidebook.  Patient learns the recommended strategies to achieve and enjoy long-term exercise adherence, including variety, self-motivation, self-efficacy, and positive decision making. Benefits of exercise include fitness,  good health, weight management, more energy, better sleep, less stress, and overall well-being.  Medical   Heart Disease Risk Reduction Clinical staff conducted group or individual video education with verbal and written material and guidebook.  Patient learns our heart is our most vital organ as it circulates oxygen, nutrients, white blood cells, and hormones throughout the entire body, and carries waste away. Data supports a plant-based eating plan like the Pritikin Program for its effectiveness in slowing progression of and reversing heart disease. The video provides a number of recommendations to address heart disease.   Metabolic Syndrome and Belly Fat  Clinical staff conducted group or individual video education with verbal and written material and guidebook.  Patient learns what metabolic syndrome is, how it leads to heart disease, and how one can reverse it and keep it from coming back. You have metabolic syndrome if you have 3 of the following 5 criteria: abdominal obesity, high blood pressure, high triglycerides, low HDL cholesterol, and high blood sugar.  Hypertension and Heart Disease Clinical staff conducted group or individual video education with verbal and written material and guidebook.  Patient learns that high blood pressure, or hypertension, is very common in the [place]. Hypertension is largely due to excessive salt intake, but other important risk factors include being overweight, physical inactivity, drinking too much alcohol, smoking, and not eating enough potassium from fruits and vegetables. High blood pressure is a leading risk factor for heart attack, stroke, congestive heart failure, dementia, kidney failure, and premature death. Long-term effects of excessive salt intake include stiffening of the arteries and thickening of heart muscle and organ damage. Recommendations include ways to reduce hypertension and the risk of heart disease.  Diseases of Our Time -  Focusing on Diabetes Clinical staff conducted group or individual video education with verbal and written material and guidebook.  Patient learns why the best way to stop diseases of our time is prevention, through food and other lifestyle changes. Medicine (such as prescription pills and surgeries) is often only a Band-Aid on the problem, not a long-term solution. Most common diseases of our time include obesity, type 2 diabetes, hypertension, heart disease, and cancer. The Pritikin Program is recommended and has been proven to help reduce, reverse, and/or prevent the damaging effects of metabolic syndrome.  Nutrition   Overview of the Pritikin Eating Plan  Clinical staff conducted group or individual video education with verbal and written material and guidebook.  Patient learns about the [place] for disease risk reduction. The [place] emphasizes a wide variety of unrefined, minimally-processed carbohydrates, like fruits, vegetables, whole grains, and legumes. Go, Caution, and Stop food choices are explained. Plant-based and lean animal proteins are emphasized. Rationale provided for low sodium intake for blood pressure control, low added sugars for blood sugar stabilization, and low added fats and oils for coronary artery disease risk reduction and weight management.  Calorie Density  Clinical staff conducted group or individual video education with verbal and written material and guidebook.  Patient learns about calorie density and how it impacts the Pritikin Eating Plan. Knowing the characteristics of the food you choose will help you decide whether those foods will lead to weight gain or weight loss, and whether you  want to consume more or less of them. Weight loss is usually a side effect of the Pritikin Eating Plan because of its focus on low calorie-dense foods.  Label Reading  Clinical staff conducted group or individual video education with verbal and written  material and guidebook.  Patient learns about the Pritikin recommended label reading guidelines and corresponding recommendations regarding calorie density, added sugars, sodium content, and whole grains.  Dining Out - Part 1  Clinical staff conducted group or individual video education with verbal and written material and guidebook.  Patient learns that restaurant meals can be sabotaging because they can be so high in calories, fat, sodium, and/or sugar. Patient learns recommended strategies on how to positively address this and avoid unhealthy pitfalls.  Facts on Fats  Clinical staff conducted group or individual video education with verbal and written material and guidebook.  Patient learns that lifestyle modifications can be just as effective, if not more so, as many medications for lowering your risk of heart disease. A Pritikin lifestyle can help to reduce your risk of inflammation and atherosclerosis (cholesterol build-up, or plaque, in the artery walls). Lifestyle interventions such as dietary choices and physical activity address the cause of atherosclerosis. A review of the types of fats and their impact on blood cholesterol levels, along with dietary recommendations to reduce fat intake is also included.  Nutrition Action Plan  Clinical staff conducted group or individual video education with verbal and written material and guidebook.  Patient learns how to incorporate Pritikin recommendations into their lifestyle. Recommendations include planning and keeping personal health goals in mind as an important part of their success.  Healthy Mind-Set    Healthy Minds, Bodies, Hearts  Clinical staff conducted group or individual video education with verbal and written material and guidebook.  Patient learns how to identify when they are stressed. Video will discuss the impact of that stress, as well as the many benefits of stress management. Patient will also be introduced to stress management  techniques. The way we think, act, and feel has an impact on our hearts.  How Our Thoughts Can Heal Our Hearts  Clinical staff conducted group or individual video education with verbal and written material and guidebook.  Patient learns that negative thoughts can cause depression and anxiety. This can result in negative lifestyle behavior and serious health problems. Cognitive behavioral therapy is an effective method to help control our thoughts in order to change and improve our emotional outlook.  Additional Videos:  Exercise    Improving Performance  Clinical staff conducted group or individual video education with verbal and written material and guidebook.  Patient learns to use a non-linear approach by alternating intensity levels and lengths of time spent exercising to help burn more calories and lose more body fat. Cardiovascular exercise helps improve heart health, metabolism, hormonal balance, blood sugar control, and recovery from fatigue. Resistance training improves strength, endurance, balance, coordination, reaction time, metabolism, and muscle mass. Flexibility exercise improves circulation, posture, and balance. Seek guidance from your physician and exercise physiologist before implementing an exercise routine and learn your capabilities and proper form for all exercise.  Introduction to Yoga  Clinical staff conducted group or individual video education with verbal and written material and guidebook.  Patient learns about yoga, a discipline of the coming together of mind, breath, and body. The benefits of yoga include improved flexibility, improved range of motion, better posture and core strength, increased lung function, weight loss, and positive self-image. Yoga's heart health  benefits include lowered blood pressure, healthier heart rate, decreased cholesterol and triglyceride levels, improved immune function, and reduced stress. Seek guidance from your physician and exercise  physiologist before implementing an exercise routine and learn your capabilities and proper form for all exercise.  Medical   Aging: Enhancing Your Quality of Life  Clinical staff conducted group or individual video education with verbal and written material and guidebook.  Patient learns key strategies and recommendations to stay in good physical health and enhance quality of life, such as prevention strategies, having an advocate, securing a [place], and keeping a list of medications and system for tracking them. It also discusses how to avoid risk for bone loss.  Biology of Weight Control  Clinical staff conducted group or individual video education with verbal and written material and guidebook.  Patient learns that weight gain occurs because we consume more calories than we burn (eating more, moving less). Even if your body weight is normal, you may have higher ratios of fat compared to muscle mass. Too much body fat puts you at increased risk for cardiovascular disease, heart attack, stroke, type 2 diabetes, and obesity-related cancers. In addition to exercise, following the [place] can help reduce your risk.  Decoding Lab Results  Clinical staff conducted group or individual video education with verbal and written material and guidebook.  Patient learns that lab test reflects one measurement whose values change over time and are influenced by many factors, including medication, stress, sleep, exercise, food, hydration, pre-existing medical conditions, and more. It is recommended to use the knowledge from this video to become more involved with your lab results and evaluate your numbers to speak with your doctor.   Diseases of Our Time - Overview  Clinical staff conducted group or individual video education with verbal and written material and guidebook.  Patient learns that according to the CDC, 50% to 70% of chronic diseases (such as obesity,  type 2 diabetes, elevated lipids, hypertension, and heart disease) are avoidable through lifestyle improvements including healthier food choices, listening to satiety cues, and increased physical activity.  Sleep Disorders Clinical staff conducted group or individual video education with verbal and written material and guidebook.  Patient learns how good quality and duration of sleep are important to overall health and well-being. Patient also learns about sleep disorders and how they impact health along with recommendations to address them, including discussing with a physician.  Nutrition  Dining Out - Part 2 Clinical staff conducted group or individual video education with verbal and written material and guidebook.  Patient learns how to plan ahead and communicate in order to maximize their dining experience in a healthy and nutritious manner. Included are recommended food choices based on the type of restaurant the patient is visiting.   Fueling a Best boy conducted group or individual video education with verbal and written material and guidebook.  There is a strong connection between our food choices and our health. Diseases like obesity and type 2 diabetes are very prevalent and are in large-part due to lifestyle choices. The Pritikin Eating Plan provides plenty of food and hunger-curbing satisfaction. It is easy to follow, affordable, and helps reduce health risks.  Menu Workshop  Clinical staff conducted group or individual video education with verbal and written material and guidebook.  Patient learns that restaurant meals can sabotage health goals because they are often packed with calories, fat, sodium, and sugar. Recommendations include strategies to  plan ahead and to communicate with the manager, chef, or server to help order a healthier meal.  Planning Your Eating Strategy  Clinical staff conducted group or individual video education with verbal and written  material and guidebook.  Patient learns about the [place] and its benefit of reducing the risk of disease. The [place] does not focus on calories. Instead, it emphasizes high-quality, nutrient-rich foods. By knowing the characteristics of the foods, we choose, we can determine their calorie density and make informed decisions.  Targeting Your Nutrition Priorities  Clinical staff conducted group or individual video education with verbal and written material and guidebook.  Patient learns that lifestyle habits have a tremendous impact on disease risk and progression. This video provides eating and physical activity recommendations based on your personal health goals, such as reducing LDL cholesterol, losing weight, preventing or controlling type 2 diabetes, and reducing high blood pressure.  Vitamins and Minerals  Clinical staff conducted group or individual video education with verbal and written material and guidebook.  Patient learns different ways to obtain key vitamins and minerals, including through a recommended healthy diet. It is important to discuss all supplements you take with your doctor.   Healthy Mind-Set    Smoking Cessation  Clinical staff conducted group or individual video education with verbal and written material and guidebook.  Patient learns that cigarette smoking and tobacco addiction pose a serious health risk which affects millions of people. Stopping smoking will significantly reduce the risk of heart disease, lung disease, and many forms of cancer. Recommended strategies for quitting are covered, including working with your doctor to develop a successful plan.  Culinary   Becoming a Financial trader conducted group or individual video education with verbal and written material and guidebook.  Patient learns that cooking at home can be healthy, cost-effective, quick, and puts them in control. Keys to cooking healthy recipes will  include looking at your recipe, assessing your equipment needs, planning ahead, making it simple, choosing cost-effective seasonal ingredients, and limiting the use of added fats, salts, and sugars.  Cooking - Breakfast and Snacks  Clinical staff conducted group or individual video education with verbal and written material and guidebook.  Patient learns how important breakfast is to satiety and nutrition through the entire day. Recommendations include key foods to eat during breakfast to help stabilize blood sugar levels and to prevent overeating at meals later in the day. Planning ahead is also a key component.  Cooking - Human resources officer conducted group or individual video education with verbal and written material and guidebook.  Patient learns eating strategies to improve overall health, including an approach to cook more at home. Recommendations include thinking of animal protein as a side on your plate rather than center stage and focusing instead on lower calorie dense options like vegetables, fruits, whole grains, and plant-based proteins, such as beans. Making sauces in large quantities to freeze for later and leaving the skin on your vegetables are also recommended to maximize your experience.  Cooking - Healthy Salads and Dressing Clinical staff conducted group or individual video education with verbal and written material and guidebook.  Patient learns that vegetables, fruits, whole grains, and legumes are the foundations of the [place]. Recommendations include how to incorporate each of these in flavorful and healthy salads, and how to create homemade salad dressings. Proper handling of ingredients is also covered. Cooking - Soups and [employer] -  Soups and Desserts Clinical staff conducted group or individual video education with verbal and written material and guidebook.  Patient learns that Pritikin soups and desserts make for easy, nutritious, and  delicious snacks and meal components that are low in sodium, fat, sugar, and calorie density, while high in vitamins, minerals, and filling fiber. Recommendations include simple and healthy ideas for soups and desserts.   Overview     The Pritikin Solution Program Overview Clinical staff conducted group or individual video education with verbal and written material and guidebook.  Patient learns that the results of the [place] Program have been documented in more than 100 articles published in peer-reviewed journals, and the benefits include reducing risk factors for (and, in some cases, even reversing) high cholesterol, high blood pressure, type 2 diabetes, obesity, and more! An overview of the three key pillars of the Pritikin Program will be covered: eating well, doing regular exercise, and having a healthy mind-set.  WORKSHOPS  Exercise: Exercise Basics: Building Your Action Plan Clinical staff led group instruction and group discussion with PowerPoint presentation and patient guidebook. To enhance the learning environment the use of posters, models and videos may be added. At the conclusion of this workshop, patients will comprehend the difference between physical activity and exercise, as well as the benefits of incorporating both, into their routine. Patients will understand the FITT (Frequency, Intensity, Time, and Type) principle and how to use it to build an exercise action plan. In addition, safety concerns and other considerations for exercise and cardiac rehab will be addressed by the presenter. The purpose of this lesson is to promote a comprehensive and effective weekly exercise routine in order to improve patients' overall level of fitness.   Managing Heart Disease: Your Path to a Healthier Heart Clinical staff led group instruction and group discussion with PowerPoint presentation and patient guidebook. To enhance the learning environment the use of posters, models and videos  may be added.At the conclusion of this workshop, patients will understand the anatomy and physiology of the heart. Additionally, they will understand how Pritikin's three pillars impact the risk factors, the progression, and the management of heart disease.  The purpose of this lesson is to provide a high-level overview of the heart, heart disease, and how the Pritikin lifestyle positively impacts risk factors.  Exercise Biomechanics Clinical staff led group instruction and group discussion with PowerPoint presentation and patient guidebook. To enhance the learning environment the use of posters, models and videos may be added. Patients will learn how the structural parts of their bodies function and how these functions impact their daily activities, movement, and exercise. Patients will learn how to promote a neutral spine, learn how to manage pain, and identify ways to improve their physical movement in order to promote healthy living. The purpose of this lesson is to expose patients to common physical limitations that impact physical activity. Participants will learn practical ways to adapt and manage aches and pains, and to minimize their effect on regular exercise. Patients will learn how to maintain good posture while sitting, walking, and lifting.  Balance Training and Fall Prevention  Clinical staff led group instruction and group discussion with PowerPoint presentation and patient guidebook. To enhance the learning environment the use of posters, models and videos may be added. At the conclusion of this workshop, patients will understand the importance of their sensorimotor skills (vision, proprioception, and the vestibular system) in maintaining their ability to balance as they age. Patients will apply a variety of balancing  exercises that are appropriate for their current level of function. Patients will understand the common causes for poor balance, possible solutions to these  problems, and ways to modify their physical environment in order to minimize their fall risk. The purpose of this lesson is to teach patients about the importance of maintaining balance as they age and ways to minimize their risk of falling.  WORKSHOPS   Nutrition:  Fueling a Scientist, research (physical sciences) led group instruction and group discussion with PowerPoint presentation and patient guidebook. To enhance the learning environment the use of posters, models and videos may be added. Patients will review the foundational principles of the [place] and understand what constitutes a serving size in each of the food groups. Patients will also learn Pritikin-friendly foods that are better choices when away from home and review make-ahead meal and snack options. Calorie density will be reviewed and applied to three nutrition priorities: weight maintenance, weight loss, and weight gain. The purpose of this lesson is to reinforce (in a group setting) the key concepts around what patients are recommended to eat and how to apply these guidelines when away from home by planning and selecting Pritikin-friendly options. Patients will understand how calorie density may be adjusted for different weight management goals.  Mindful Eating  Clinical staff led group instruction and group discussion with PowerPoint presentation and patient guidebook. To enhance the learning environment the use of posters, models and videos may be added. Patients will briefly review the concepts of the [place] and the importance of low-calorie dense foods. The concept of mindful eating will be introduced as well as the importance of paying attention to internal hunger signals. Triggers for non-hunger eating and techniques for dealing with triggers will be explored. The purpose of this lesson is to provide patients with the opportunity to review the basic principles of the [place], discuss the value of  eating mindfully and how to measure internal cues of hunger and fullness using the Hunger Scale. Patients will also discuss reasons for non-hunger eating and learn strategies to use for controlling emotional eating.  Targeting Your Nutrition Priorities Clinical staff led group instruction and group discussion with PowerPoint presentation and patient guidebook. To enhance the learning environment the use of posters, models and videos may be added. Patients will learn how to determine their genetic susceptibility to disease by reviewing their family history. Patients will gain insight into the importance of diet as part of an overall healthy lifestyle in mitigating the impact of genetics and other environmental insults. The purpose of this lesson is to provide patients with the opportunity to assess their personal nutrition priorities by looking at their family history, their own health history and current risk factors. Patients will also be able to discuss ways of prioritizing and modifying the [place] for their highest risk areas  Menu  Clinical staff led group instruction and group discussion with PowerPoint presentation and patient guidebook. To enhance the learning environment the use of posters, models and videos may be added. Using menus brought in from [employer], or printed from [employer], patients will apply the [place] dining out guidelines that were presented in the [employer] video. Patients will also be able to practice these guidelines in a variety of provided scenarios. The purpose of this lesson is to provide patients with the opportunity to practice hands-on learning of the [place] with actual menus and practice scenarios.  Label Reading  Clinical staff led group instruction and group discussion with PowerPoint presentation and patient guidebook. To enhance the learning environment the use of posters, models and videos may be  added. Patients will review and discuss the Pritikin label reading guidelines presented in Pritikin's Label Reading Educational series video. Using fool labels brought in from local grocery stores and markets, patients will apply the label reading guidelines and determine if the packaged food meet the Pritikin guidelines. The purpose of this lesson is to provide patients with the opportunity to review, discuss, and practice hands-on learning of the [employer] Label Reading guidelines with actual packaged food labels. [place] Workshops are designed to teach patients ways to prepare quick, simple, and affordable recipes at home. The importance of nutrition's role in chronic disease risk reduction is reflected in its emphasis in the overall P[employer]ikin program. By learning how to prepare essential core P[employer]in Eating Plan recipes, patients will increase control over what they eat; be able to customize the flavor of foods without the use of added salt, sugar, or fat; and improve the quality of the food they consume. By learning a set of core recipes which are easily assembled, quickly prepared, and affordable, patients are more likely to prepare more healthy foods at home. These workshops focus on convenient breakfasts, simple entres, side dishes, and desserts which can be prepared with minimal effort and are consistent with nutrition recommendations for cardiovascular risk reduction. Cooking [employer] are taught by a Engineer, materials (RD) who has been trained by the [employer]. The chef or RD has a clear understanding of the importance of minimizing - if not completely eliminating - added fat, sugar, and sodium in recipes. Throughout the series of [place] Workshop sessions, patients will learn about healthy ingredients and efficient methods of cooking to build confidence in their capability to prepare    Cooking School weekly topics:  Adding  Flavor- Sodium-Free  Fast and Healthy Breakfasts  Powerhouse Plant-Based Proteins  Satisfying Salads and Dressings  Simple Sides and Sauces  International Cuisine-Spotlight on the [place] Zones  Delicious Desserts  Savory Soups  Efficiency Cooking - Meals in a Snap  Tasty Appetizers and Snacks  Comforting Weekend Breakfasts  One-Pot Wonders   Fast Evening Meals  Easy [place] (Psychosocial): New Thoughts, New Behaviors Clinical staff led group instruction and group discussion with PowerPoint presentation and patient guidebook. To enhance the learning environment the use of posters, models and videos may be added. Patients will learn and practice techniques for developing effective health and lifestyle goals. Patients will be able to effectively apply the goal setting process learned to develop at least one new personal goal.  The purpose of this lesson is to expose patients to a new skill set of behavior modification techniques such as techniques setting SMART goals, overcoming barriers, and achieving new thoughts and new behaviors.  Managing Moods and Relationships Clinical staff led group instruction and group discussion with PowerPoint presentation and patient guidebook. To enhance the learning environment the use of posters, models and videos may be added. Patients will learn how emotional and chronic stress factors can impact their health and relationships. They will learn healthy ways to manage their moods and utilize positive coping mechanisms. In addition, ICR patients will learn ways to improve communication skills. The purpose of this lesson is to expose patients to ways of understanding how one's mood and health are  intimately connected. Developing a healthy outlook can help build positive relationships and connections with others. Patients will understand the importance of utilizing effective communication skills  that include actively listening and being heard. They will learn and understand the importance of the "4 Cs" and especially Connections in fostering of a Healthy Mind-Set.  Healthy Sleep for a Healthy Heart Clinical staff led group instruction and group discussion with PowerPoint presentation and patient guidebook. To enhance the learning environment the use of posters, models and videos may be added. At the conclusion of this workshop, patients will be able to demonstrate knowledge of the importance of sleep to overall health, well-being, and quality of life. They will understand the symptoms of, and treatments for, common sleep disorders. Patients will also be able to identify daytime and nighttime behaviors which impact sleep, and they will be able to apply these tools to help manage sleep-related challenges. The purpose of this lesson is to provide patients with a general overview of sleep and outline the importance of quality sleep. Patients will learn about a few of the most common sleep disorders. Patients will also be introduced to the concept of "sleep hygiene," and discover ways to self-manage certain sleeping problems through simple daily behavior changes. Finally, the workshop will motivate patients by clarifying the links between quality sleep and their goals of heart-healthy living.   Recognizing and Reducing Stress Clinical staff led group instruction and group discussion with PowerPoint presentation and patient guidebook. To enhance the learning environment the use of posters, models and videos may be added. At the conclusion of this workshop, patients will be able to understand the types of stress reactions, differentiate between acute and chronic stress, and recognize the impact that chronic stress has on their health. They will also be able to apply different coping mechanisms, such as reframing negative self-talk. Patients will have the opportunity to practice a variety of stress management  techniques, such as deep abdominal breathing, progressive muscle relaxation, and/or guided imagery.  The purpose of this lesson is to educate patients on the role of stress in their lives and to provide healthy techniques for coping with it.  Learning Barriers/Preferences:  Learning Barriers/Preferences - 03/16/22 1152       Learning Barriers/Preferences   Learning Barriers Sight;Exercise Concerns   balance concerns   Learning Preferences Audio;Group Instruction;Individual Instruction;Verbal Instruction             Education Topics:  Knowledge Questionnaire Score:  Knowledge Questionnaire Score - 03/16/22 1154       Knowledge Questionnaire Score   Pre Score 22/24             Core Components/Risk Factors/Patient Goals at Admission:  Personal Goals and Risk Factors at Admission - 03/16/22 1416       Core Components/Risk Factors/Patient Goals on Admission    Weight Management Yes;Weight Loss    Intervention Weight Management: Develop a combined nutrition and exercise program designed to reach desired caloric intake, while maintaining appropriate intake of nutrient and fiber, sodium and fats, and appropriate energy expenditure required for the weight goal.;Weight Management: Provide education and appropriate resources to help participant work on and attain dietary goals.;Weight Management/Obesity: Establish reasonable short term and long term weight goals.;Obesity: Provide education and appropriate resources to help participant work on and attain dietary goals.    Diabetes Yes    Intervention Provide education about signs/symptoms and action to take for hypo/hyperglycemia.;Provide education about proper nutrition, including hydration, and aerobic/resistive exercise prescription along with prescribed  medications to achieve blood glucose in normal ranges: Fasting glucose 65-99 mg/dL    Expected Outcomes Short Term: Participant verbalizes understanding of the signs/symptoms and  immediate care of hyper/hypoglycemia, proper foot care and importance of medication, aerobic/resistive exercise and nutrition plan for blood glucose control.;Long Term: Attainment of HbA1C < 7%.    Hypertension Yes    Intervention Provide education on lifestyle modifcations including regular physical activity/exercise, weight management, moderate sodium restriction and increased consumption of fresh fruit, vegetables, and low fat dairy, alcohol moderation, and smoking cessation.;Monitor prescription use compliance.    Expected Outcomes Short Term: Continued assessment and intervention until BP is < 140/62m HG in hypertensive participants. < 130/835mHG in hypertensive participants with diabetes, heart failure or chronic kidney disease.;Long Term: Maintenance of blood pressure at goal levels.    Lipids Yes    Intervention Provide education and support for participant on nutrition & aerobic/resistive exercise along with prescribed medications to achieve LDL '70mg'$ , HDL >'40mg'$ .    Expected Outcomes Short Term: Participant states understanding of desired cholesterol values and is compliant with medications prescribed. Participant is following exercise prescription and nutrition guidelines.;Long Term: Cholesterol controlled with medications as prescribed, with individualized exercise RX and with personalized nutrition plan. Value goals: LDL < '70mg'$ , HDL > 40 mg.             Core Components/Risk Factors/Patient Goals Review:   Goals and Risk Factor Review     Row Name 03/20/22 1629 04/17/22 1825 05/09/22 1104         Core Components/Risk Factors/Patient Goals Review   Personal Goals Review Weight Management/Obesity;Hypertension;Lipids;Diabetes Weight Management/Obesity;Hypertension;Lipids;Diabetes Weight Management/Obesity;Hypertension;Lipids;Diabetes     Review Rally started exercise at cardiac rehab on 03/20/22. Brenda did well with exercise. Vital signs and CBG's were stable. Tahje has done well with  exercise at cardiac rehab when in attendance. Vital signs and CBG's have been stable. JoPrithvis doing well with exercise at cardiac rehab. Vital signs and CBG's have been stable. JoCrossill complete cardiac rehab on 05/19/22.     Expected Outcomes JoTanmayill continue to participate in phase 2 cardiac rehab for exercise, nutrition and lifestyle modifications JoRollenill continue to participate in phase 2 cardiac rehab for exercise, nutrition and lifestyle modifications JoCamranill continue to participate in phase 2 cardiac rehab for exercise, nutrition and lifestyle modifications              Core Components/Risk Factors/Patient Goals at Discharge (Final Review):   Goals and Risk Factor Review - 05/09/22 1104       Core Components/Risk Factors/Patient Goals Review   Personal Goals Review Weight Management/Obesity;Hypertension;Lipids;Diabetes    Review JoHelios doing well with exercise at cardiac rehab. Vital signs and CBG's have been stable. JoLazariusill complete cardiac rehab on 05/19/22.    Expected Outcomes JoLochlanill continue to participate in phase 2 cardiac rehab for exercise, nutrition and lifestyle modifications             ITP Comments:  ITP Comments     Row Name 03/16/22 1013 03/20/22 1626 04/17/22 1821 05/09/22 1102     ITP Comments Dr TrFransico HimD, Medical Director 30 Day ITP Review. Jakye started exercise at cardiac rehab on 03/20/22. Shaquill did well with exercise. 30 Day ITP Review. Zoe last day of exercise was on 04/12/22. Orley has done well with exercise when in attendance. 30 Day ITP Review. JoChristophas good attendance and participation in phase 2 cardiac rehab. Josafat will complete cardiac rehab on  05/19/22             Comments: See ITP Comments

## 2022-05-10 NOTE — Progress Notes (Signed)
Pre exercise CBG 176. Cheyne said that he had his insulin pump at 50% basal rate. Carlin said that his blood sugar had dropped to 114 by his CGM and that his blood sugar was dropping(by his meter) Patient asymptomatic. Patient drank a juice box of apple juice he bought from home, Exercise stopped after 3 minutes on the nustep second station. CBG 128 by the hospital meter. Abhi was able to participate in the cool down stretches without complaints. Exit CBG 114 by Fadil's CBG. Will continue to monitor the patient throughout  the [place] RN BSN

## 2022-05-12 ENCOUNTER — Encounter (HOSPITAL_COMMUNITY)
Admission: RE | Admit: 2022-05-12 | Discharge: 2022-05-12 | Disposition: A | Discharge status: Home / Self Care | Payer: Medicare Other | Source: Ambulatory Visit | Attending: Cardiovascular Disease | Admitting: Cardiovascular Disease

## 2022-05-12 ENCOUNTER — Encounter (HOSPITAL_COMMUNITY): Payer: Medicare Other

## 2022-05-12 DIAGNOSIS — Z951 Presence of aortocoronary bypass graft: Secondary | ICD-10-CM | POA: Diagnosis not present

## 2022-05-12 DIAGNOSIS — E162 Hypoglycemia, unspecified: Secondary | ICD-10-CM | POA: Diagnosis not present

## 2022-05-15 ENCOUNTER — Encounter (HOSPITAL_COMMUNITY)
Admission: RE | Admit: 2022-05-15 | Discharge: 2022-05-15 | Disposition: A | Discharge status: Home / Self Care | Payer: Medicare Other | Source: Ambulatory Visit | Attending: Cardiovascular Disease | Admitting: Cardiovascular Disease

## 2022-05-15 VITALS — Ht 71.75 in | Wt 224.0 lb

## 2022-05-15 DIAGNOSIS — Z48812 Encounter for surgical aftercare following surgery on the circulatory system: Secondary | ICD-10-CM | POA: Insufficient documentation

## 2022-05-15 DIAGNOSIS — Z951 Presence of aortocoronary bypass graft: Secondary | ICD-10-CM | POA: Diagnosis not present

## 2022-05-17 ENCOUNTER — Encounter (HOSPITAL_COMMUNITY)
Admission: RE | Admit: 2022-05-17 | Discharge: 2022-05-17 | Disposition: A | Discharge status: Home / Self Care | Payer: Medicare Other | Source: Ambulatory Visit | Attending: Cardiovascular Disease | Admitting: Cardiovascular Disease

## 2022-05-17 DIAGNOSIS — Z48812 Encounter for surgical aftercare following surgery on the circulatory system: Secondary | ICD-10-CM | POA: Diagnosis not present

## 2022-05-17 DIAGNOSIS — Z951 Presence of aortocoronary bypass graft: Secondary | ICD-10-CM | POA: Diagnosis not present

## 2022-05-19 ENCOUNTER — Telehealth (HOSPITAL_COMMUNITY): Payer: Self-pay | Admitting: Internal Medicine

## 2022-05-19 ENCOUNTER — Encounter (HOSPITAL_COMMUNITY)
Admission: RE | Admit: 2022-05-19 | Discharge: 2022-05-19 | Disposition: A | Discharge status: Home / Self Care | Payer: Medicare Other | Source: Ambulatory Visit | Attending: Cardiovascular Disease | Admitting: Cardiovascular Disease

## 2022-05-19 DIAGNOSIS — Z951 Presence of aortocoronary bypass graft: Secondary | ICD-10-CM

## 2022-05-19 NOTE — Progress Notes (Signed)
QUALITY OF LIFE SCORE REVIEW Pt completed Quality of Life survey as a participant in Cardiac Rehab.  Scores 19.0 or below are considered low. Patient scores Overall 17.59, Health and Function 13.53, socioeconomic 19.57, physiological and spiritual 17.14, family 27.6. Patient quality of life slightly altered by physical constraints which limits ability to perform as prior to recent cardiac illness. Patient does report improvement in many areas an states he will continue to maintain a heart healthy lifestyle. Patient denies depression.     Albertine Grates RN  05/19/22  1336

## 2022-05-19 NOTE — Progress Notes (Signed)
CBG 321 this AM patient had warmed up and did not notify us that his blood sugar was over 300 until he had already started to exercise. Exercise stopped. Shaquill reported eating shredded wheat grapes and a piece of bread for breakfast. Exercise stopped. Mahamed was supposed to complete cardiac rehab today. He will complete his last session on Monday.Barnet Pall, RN,BSN 05/19/2022 11:18 AM

## 2022-05-22 ENCOUNTER — Encounter (HOSPITAL_COMMUNITY)
Admission: RE | Admit: 2022-05-22 | Discharge: 2022-05-22 | Disposition: A | Discharge status: Home / Self Care | Payer: Medicare Other | Source: Ambulatory Visit | Attending: Cardiovascular Disease | Admitting: Cardiovascular Disease

## 2022-05-22 VITALS — BP 122/72 | HR 71 | Ht 71.75 in | Wt 222.7 lb

## 2022-05-22 DIAGNOSIS — Z951 Presence of aortocoronary bypass graft: Secondary | ICD-10-CM | POA: Diagnosis not present

## 2022-05-22 DIAGNOSIS — Z48812 Encounter for surgical aftercare following surgery on the circulatory system: Secondary | ICD-10-CM | POA: Diagnosis not present

## 2022-05-22 NOTE — Progress Notes (Signed)
Discharge Progress Report  Patient Details  Name: Jason Giles MRN: [id number] Date of Birth: [birth year] Referring Provider:   Flowsheet Row CARDIAC REHAB PHASE II ORIENTATION from 03/16/2022 in [place]  Referring Provider Dr. Mertie Moores        Number of Visits: 25  Reason for Discharge:  Patient reached a stable level of exercise. Patient independent in their exercise. Patient has met program and personal goals.  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 Mertie Moores    Expected Discharge Date 05/12/21      NuStep   Level 2    SPM 75    Minutes 15    METs 1.8      Arm Ergometer   Level 1    RPM 55    Minutes 15    METs 1.8      Prescription Details   Frequency (times per week) 3    Duration Progress to 30 minutes of continuous aerobic without signs/symptoms of physical distress      Intensity   THRR 40-80% of Max Heartrate 60-114    Ratings of Perceived Exertion 11-13    Perceived Dyspnea 0-4      Progression   Progression Continue progressive overload as per policy without signs/symptoms or physical distress.      Resistance Training   Training Prescription Yes    Weight 3    Reps 10-15             Discharge Exercise Prescription (Final Exercise Prescription Changes):  Exercise Prescription Changes - 05/22/22 1025       Response to Exercise   Blood Pressure (Admit) 122/72    Blood Pressure (Exercise) 122/70    Blood Pressure (Exit) 118/70    Heart Rate (Admit) 71 bpm    Heart Rate (Exercise) 91 bpm    Heart Rate (Exit) 76 bpm    Rating of  Perceived Exertion (Exercise) 11    Symptoms None    Comments Patient completed the cardiac rehab program today.    Duration Continue with 30 min of aerobic exercise without signs/symptoms of physical distress.    Intensity THRR unchanged      Progression   Progression Continue to progress workloads to maintain intensity without signs/symptoms of physical distress.    Average METs 2.1      Resistance Training   Training Prescription Yes    Weight 4 lbs    Reps 10-15    Time 10 Minutes      Interval Training   Interval Training No      NuStep   Level 5    SPM 85    Minutes 15    METs 2.4      Arm Ergometer   Level 5    Minutes 15    METs 1.8      Home Exercise Plan   Plans to continue exercise at [employer] [employer] (comment)   [facility] and [facility].   Frequency Add 3 additional days to program exercise sessions.  Initial Home Exercises Provided 04/05/22             Functional Capacity:  6 Minute Walk     Row Name 03/16/22 1126 05/15/22 1413       6 Minute Walk   Phase Initial Discharge    Distance 1473 feet 1832 feet    Distance % Change -- 24.37 %    Distance Feet Change -- 359 ft    Walk Time 6 minutes 6 minutes    # of Rest Breaks 0 0    MPH 2.79 3.47    METS 2.67 3.29    RPE 11 14    Perceived Dyspnea  0 0    VO2 Peak 9.35 11.53    Symptoms No No    Resting HR 74 bpm 97 bpm    Resting BP 114/68 118/62    Resting Oxygen Saturation  97 % 97 %    Exercise Oxygen Saturation  during 6 min walk 98 % 94 %    Max Ex. HR 90 bpm 100 bpm    Max Ex. BP 142/56 128/58    2 Minute Post BP 142/70  Post 2 mins 118/62 106/70             Psychological, QOL, Others - Outcomes: PHQ 2/9:    05/22/2022   11:24 AM 03/16/2022   10:25 AM 03/21/2016    8:15 AM  Depression screen PHQ 2/9  Decreased Interest 0 0 0  Down, Depressed, Hopeless 0 0 0  PHQ - 2 Score 0 0 0    Quality of Life:  Quality of Life - 05/19/22 1130       Quality of Life    Select Quality of Life      Quality of Life Scores   Health/Function Pre 22.4 %    Health/Function Post 13.53 %    Health/Function % Change -39.6 %    Socioeconomic Pre 24.88 %    Socioeconomic Post 19.57 %    Socioeconomic % Change  -21.34 %    Psych/Spiritual Pre 21.43 %    Psych/Spiritual Post 17.14 %    Psych/Spiritual % Change -20.02 %    Family Pre 30 %    Family Post 27.6 %    Family % Change -8 %    GLOBAL Pre 23.86 %    GLOBAL Post 17.59 %    GLOBAL % Change -26.28 %             Personal Goals: Goals established at orientation with interventions provided to work toward goal.  Personal Goals and Risk Factors at Admission - 03/16/22 1416       Core Components/Risk Factors/Patient Goals on Admission    Weight Management Yes;Weight Loss    Intervention Weight Management: Develop a combined nutrition and exercise program designed to reach desired caloric intake, while maintaining appropriate intake of nutrient and fiber, sodium and fats, and appropriate energy expenditure required for the weight goal.;Weight Management: Provide education and appropriate resources to help participant work on and attain dietary goals.;Weight Management/Obesity: Establish reasonable short term and long term weight goals.;Obesity: Provide education and appropriate resources to help participant work on and attain dietary goals.    Diabetes Yes    Intervention Provide education about signs/symptoms and action to take for hypo/hyperglycemia.;Provide education about proper nutrition, including hydration, and aerobic/resistive exercise prescription along with prescribed medications to achieve blood glucose in normal ranges: Fasting glucose 65-99 mg/dL    Expected Outcomes Short Term:  Participant verbalizes understanding of the signs/symptoms and immediate care of hyper/hypoglycemia, proper foot care and importance of medication, aerobic/resistive exercise and nutrition plan for blood glucose  control.;Long Term: Attainment of HbA1C < 7%.    Hypertension Yes    Intervention Provide education on lifestyle modifcations including regular physical activity/exercise, weight management, moderate sodium restriction and increased consumption of fresh fruit, vegetables, and low fat dairy, alcohol moderation, and smoking cessation.;Monitor prescription use compliance.    Expected Outcomes Short Term: Continued assessment and intervention until BP is < 140/37m HG in hypertensive participants. < 130/874mHG in hypertensive participants with diabetes, heart failure or chronic kidney disease.;Long Term: Maintenance of blood pressure at goal levels.    Lipids Yes    Intervention Provide education and support for participant on nutrition & aerobic/resistive exercise along with prescribed medications to achieve LDL <7053mHDL >42m27m  Expected Outcomes Short Term: Participant states understanding of desired cholesterol values and is compliant with medications prescribed. Participant is following exercise prescription and nutrition guidelines.;Long Term: Cholesterol controlled with medications as prescribed, with individualized exercise RX and with personalized nutrition plan. Value goals: LDL < 70mg59mL > 40 mg.              Personal Goals Discharge:  Goals and Risk Factor Review     Row Name 03/20/22 1629 04/17/22 1825 05/09/22 1104         Core Components/Risk Factors/Patient Goals Review   Personal Goals Review Weight Management/Obesity;Hypertension;Lipids;Diabetes Weight Management/Obesity;Hypertension;Lipids;Diabetes Weight Management/Obesity;Hypertension;Lipids;Diabetes     Review Markhi started exercise at cardiac rehab on 03/20/22. Langston did well with exercise. Vital signs and CBG's were stable. Aydyn has done well with exercise at cardiac rehab when in attendance. Vital signs and CBG's have been stable. Deavion Sevynoing well with exercise at cardiac rehab. Vital signs and CBG's have been  stable. Braeden Ashan complete cardiac rehab on 05/19/22.     Expected Outcomes Orvil Kimothy continue to participate in phase 2 cardiac rehab for exercise, nutrition and lifestyle modifications Kirklin Tayte continue to participate in phase 2 cardiac rehab for exercise, nutrition and lifestyle modifications Lavoris Jakayden continue to participate in phase 2 cardiac rehab for exercise, nutrition and lifestyle modifications              Exercise Goals and Review:  Exercise Goals     Row Name 03/16/22 1132             Exercise Goals   Increase Physical Activity Yes       Intervention Provide advice, education, support and counseling about physical activity/exercise needs.;Develop an individualized exercise prescription for aerobic and resistive training based on initial evaluation findings, risk stratification, comorbidities and participant's personal goals.       Expected Outcomes Short Term: Attend rehab on a regular basis to increase amount of physical activity.;Long Term: Add in home exercise to make exercise part of routine and to increase amount of physical activity.;Long Term: Exercising regularly at least 3-5 days a week.       Increase Strength and Stamina Yes       Intervention Provide advice, education, support and counseling about physical activity/exercise needs.;Develop an individualized exercise prescription for aerobic and resistive training based on initial evaluation findings, risk stratification, comorbidities and participant's personal goals.       Expected Outcomes Short Term: Increase workloads from initial exercise prescription for resistance, speed, and METs.;Short Term: Perform resistance training exercises routinely during rehab and add in resistance training at home;Long  Term: Improve cardiorespiratory fitness, muscular endurance and strength as measured by increased METs and functional capacity (6MWT)       Able to understand and use rate of perceived exertion (RPE) scale Yes        Intervention Provide education and explanation on how to use RPE scale       Expected Outcomes Short Term: Able to use RPE daily in rehab to express subjective intensity level;Long Term:  Able to use RPE to guide intensity level when exercising independently       Knowledge and understanding of Target Heart Rate Range (THRR) Yes       Intervention Provide education and explanation of THRR including how the numbers were predicted and where they are located for reference       Expected Outcomes Short Term: Able to state/look up THRR;Long Term: Able to use THRR to govern intensity when exercising independently;Short Term: Able to use daily as guideline for intensity in rehab       Understanding of Exercise Prescription Yes       Intervention Provide education, explanation, and written materials on patient's individual exercise prescription       Expected Outcomes Short Term: Able to explain program exercise prescription;Long Term: Able to explain home exercise prescription to exercise independently                Exercise Goals Re-Evaluation:  Exercise Goals Re-Evaluation     Row Name 03/20/22 1133 04/03/22 1105 04/05/22 1035 05/01/22 1103 05/17/22 1117     Exercise Goal Re-Evaluation   Exercise Goals Review Increase Physical Activity;Able to understand and use rate of perceived exertion (RPE) scale;Increase Strength and Stamina Increase Physical Activity;Able to understand and use rate of perceived exertion (RPE) scale;Increase Strength and Stamina Increase Physical Activity;Able to understand and use rate of perceived exertion (RPE) scale;Increase Strength and Stamina;Able to check pulse independently;Knowledge and understanding of Target Heart Rate Range (THRR);Understanding of Exercise Prescription Increase Physical Activity;Able to understand and use rate of perceived exertion (RPE) scale;Increase Strength and Stamina;Able to check pulse independently;Knowledge and understanding of Target  Heart Rate Range (THRR);Understanding of Exercise Prescription Increase Physical Activity;Able to understand and use rate of perceived exertion (RPE) scale;Increase Strength and Stamina;Able to check pulse independently;Knowledge and understanding of Target Heart Rate Range (THRR);Understanding of Exercise Prescription   Comments Patient able to understand and use RPE scale appropriately. Patient making gradual progress with exericse. Patient's goal is to have more energy to do yardwork/ ADLs. Patient has chronic back pain that affects activity level. Patient is currently walking about a [facility] or outdoors Tuesday, Thursday, and some Saturdays as his mode of home exercise. Patient has to take rest breaks with walking due to chronic back pain. Patient started doing the stretching exercises this week and has 3 and 5 lb weights for his resistance training. Patient has a Kardia heart rate monitor to check his heart rate and rhythm. Patient is interested in weight loss. Discussed increasing duration to help with weight loss goals. [facility] on the days that he doesn't attend cardiac rehab but has to take rest breaks. Patient doesn't enjoy walking but like using the recumbent stepper. Advised patient to use the SE versus walking the track if he prefers that modality, and patient is amenable to this. Patient's functional capacity increased 24% as measured by 6MWT. Patient is scheduled to complete cardiac rehab on Friday and really feels that  he's benefitted from participating in the program. Patient plans to continue [facility] or walking at the park at least 30 minutes daily.   Expected Outcomes Progress workloads as tolerated to help increase strength and stamina. Continue to progress workloads as tolerated to help increase energy level. Patient will increase exercise duration at home to help achieve weight loss goals. Patient will increase WL on AE at next  session. [facility] on the [facility] he doesn't attend cardiac rehab to help achieve personal health and fitness goals. Patient will continue walking at the park and exercising at the [facility] to maintain health and fitness gains.    [place] Name 05/22/22 1100             Exercise Goal Re-Evaluation   Exercise Goals Review Increase Physical Activity;Able to understand and use rate of perceived exertion (RPE) scale;Increase Strength and Stamina;Able to check pulse independently;Knowledge and understanding of Target Heart Rate Range (THRR);Understanding of Exercise Prescription       Comments Patient completed the cardiac rehab program and will [facility]. Patient plans to exercise at least 20 minutes daily using the recumbent stepper, stationary bike, and walking. Patient feels the program has been very beneficial for him.       Expected Outcomes Patient will continue walking at the park and exercising [facility] the [facility] to maintain health and fitness gains.                Nutrition & Weight - Outcomes:  Pre Biometrics - 03/16/22 1132       Pre Biometrics   Waist Circumference 42 inches    Hip Circumference 41 inches    Waist to Hip Ratio 1.02 %    Triceps Skinfold 22 mm    % Body Fat 31 %    Grip Strength 42 kg    Flexibility --   Pt unable to reach   Single Leg Stand 3 seconds             Post Biometrics - 05/22/22 1033        Post  Biometrics   Height 5' 11.75" (1.822 m)    Waist Circumference 43.25 inches    Hip Circumference 42.75 inches    Waist to Hip Ratio 1.01 %    Triceps Skinfold 20 mm    % Body Fat 31.2 %    Grip Strength 42 kg    Flexibility 0 in    Single Leg Stand 2 seconds             Nutrition:  Nutrition Therapy & Goals - 05/19/22 1132       Nutrition Therapy   Diet Heart Healthy/Carb Consistent    Drug/Food Interactions Statins/Certain Fruits      Personal Nutrition Goals   Nutrition Goal Patient to build a  healthy plate to include lean protein/plant protein, fruits, vegetables, whole grains, low fat dairy as part of heart healthy meal plan.    Personal Goal #2 Patient to understand and implement strategies for blood sugar control.   Protein + carbohydrates, high fiber carbohydrates sources, the plate method,etc.   Personal Goal #3 Patient to identify and limit food sources of sodium, refined carbohydrates, trans fat, and saturated fats    Comments Patient graduates next week. Goals in progress. A1c is up to 7.0; however, PCP eliminated Bolus insulin and reduced basal insulin amount as patient is controlling blood sugar better with diet and exercise.  Patient benefit will follow-up with diabetes educator as blood sugar remains elevated over the last week; patient unable to exercise today due to CBG >300. Per patient, his insulin pump was not attached appropriately. Have recommended protein source with breakfast and given handout/resource; patient continues to choose breakfast with >60g of carbohydrates and no protein regularly per diet recall.      Intervention Plan   Intervention Prescribe, educate and counsel regarding individualized specific dietary modifications aiming towards targeted core components such as weight, hypertension, lipid management, diabetes, heart failure and other comorbidities.    Expected Outcomes Short Term Goal: Understand basic principles of dietary content, such as calories, fat, sodium, cholesterol and nutrients.;Long Term Goal: Adherence to prescribed nutrition plan.             Nutrition Discharge:  Nutrition Assessments - 05/15/22 1115       Rate Your Plate Scores   Post Score 49             Education Questionnaire Score:  Knowledge Questionnaire Score - 05/15/22 1612       Knowledge Questionnaire Score   Pre Score 22/24    Post Score 20/24             Goals reviewed with patient; copy given to patient.Pt graduates from  [place] cardiac rehab  program on 05/22/22 with completion of  25 exercise and education sessions. Pt maintained good attendance and progressed nicely during their participation in rehab as evidenced by increased MET level.   Medication list reconciled. Repeat  PHQ score- 0 .  Pt has made significant lifestyle changes and should be commended for their success.  Jagger achieved their goals during cardiac rehab. Bennet increased his distance on his post exercise walk test by 359 feet.    Pt plans to continue exercise at the [facility] with his wife. We are proud of Sanjeev's progress! [name] RN BSN

## 2022-05-26 DIAGNOSIS — E1165 Type 2 diabetes mellitus with hyperglycemia: Secondary | ICD-10-CM | POA: Diagnosis not present

## 2022-05-26 DIAGNOSIS — Z794 Long term (current) use of insulin: Secondary | ICD-10-CM | POA: Diagnosis not present

## 2022-05-26 DIAGNOSIS — E11319 Type 2 diabetes mellitus with unspecified diabetic retinopathy without macular edema: Secondary | ICD-10-CM | POA: Diagnosis not present

## 2022-05-26 DIAGNOSIS — E119 Type 2 diabetes mellitus without complications: Secondary | ICD-10-CM | POA: Diagnosis not present

## 2022-06-01 DIAGNOSIS — E119 Type 2 diabetes mellitus without complications: Secondary | ICD-10-CM | POA: Diagnosis not present

## 2022-06-01 DIAGNOSIS — E11319 Type 2 diabetes mellitus with unspecified diabetic retinopathy without macular edema: Secondary | ICD-10-CM | POA: Diagnosis not present

## 2022-06-01 DIAGNOSIS — E1165 Type 2 diabetes mellitus with hyperglycemia: Secondary | ICD-10-CM | POA: Diagnosis not present

## 2022-06-01 DIAGNOSIS — Z794 Long term (current) use of insulin: Secondary | ICD-10-CM | POA: Diagnosis not present

## 2022-06-14 DIAGNOSIS — H903 Sensorineural hearing loss, bilateral: Secondary | ICD-10-CM | POA: Diagnosis not present

## 2022-06-20 DIAGNOSIS — I1310 Hypertensive heart and chronic kidney disease without heart failure, with stage 1 through stage 4 chronic kidney disease, or unspecified chronic kidney disease: Secondary | ICD-10-CM | POA: Diagnosis not present

## 2022-06-20 DIAGNOSIS — I2510 Atherosclerotic heart disease of native coronary artery without angina pectoris: Secondary | ICD-10-CM | POA: Diagnosis not present

## 2022-06-20 DIAGNOSIS — N1831 Chronic kidney disease, stage 3a: Secondary | ICD-10-CM | POA: Diagnosis not present

## 2022-06-20 DIAGNOSIS — E1129 Type 2 diabetes mellitus with other diabetic kidney complication: Secondary | ICD-10-CM | POA: Diagnosis not present

## 2022-07-12 DIAGNOSIS — E113312 Type 2 diabetes mellitus with moderate nonproliferative diabetic retinopathy with macular edema, left eye: Secondary | ICD-10-CM | POA: Diagnosis not present

## 2022-07-12 DIAGNOSIS — H43811 Vitreous degeneration, right eye: Secondary | ICD-10-CM | POA: Diagnosis not present

## 2022-07-12 DIAGNOSIS — H35371 Puckering of macula, right eye: Secondary | ICD-10-CM | POA: Diagnosis not present

## 2022-07-12 DIAGNOSIS — E113391 Type 2 diabetes mellitus with moderate nonproliferative diabetic retinopathy without macular edema, right eye: Secondary | ICD-10-CM | POA: Diagnosis not present

## 2022-08-22 DIAGNOSIS — E11319 Type 2 diabetes mellitus with unspecified diabetic retinopathy without macular edema: Secondary | ICD-10-CM | POA: Diagnosis not present

## 2022-08-22 DIAGNOSIS — Z794 Long term (current) use of insulin: Secondary | ICD-10-CM | POA: Diagnosis not present

## 2022-08-22 DIAGNOSIS — E119 Type 2 diabetes mellitus without complications: Secondary | ICD-10-CM | POA: Diagnosis not present

## 2022-08-22 DIAGNOSIS — E1165 Type 2 diabetes mellitus with hyperglycemia: Secondary | ICD-10-CM | POA: Diagnosis not present

## 2022-08-31 DIAGNOSIS — E113312 Type 2 diabetes mellitus with moderate nonproliferative diabetic retinopathy with macular edema, left eye: Secondary | ICD-10-CM | POA: Diagnosis not present

## 2022-08-31 DIAGNOSIS — Z961 Presence of intraocular lens: Secondary | ICD-10-CM | POA: Diagnosis not present

## 2022-08-31 DIAGNOSIS — H35372 Puckering of macula, left eye: Secondary | ICD-10-CM | POA: Diagnosis not present

## 2022-09-06 DIAGNOSIS — E113312 Type 2 diabetes mellitus with moderate nonproliferative diabetic retinopathy with macular edema, left eye: Secondary | ICD-10-CM | POA: Diagnosis not present

## 2022-09-12 NOTE — Progress Notes (Unsigned)
Cardiology Office Note:    Date:  09/13/2022   ID:  Darrow Bussing, DOB 05-04-1946, MRN [id number]  PCP:  Haywood Pao, MD  [facility] Cardiologist:  Mertie Moores, MD     Referring MD: Haywood Pao, MD   Chief Complaint:  F/u for CAD    Patient Profile: Coronary artery disease  S/p 2.5 x 16 mm Taxus DES to LCx in 06/2005 S/p CABG in 12/2021 (L-LAD, S-OM, S-PLB) C/b post op AFib Echocardiogram 11/2021: EF 60-65, no RWMA, mod to severe basal septal LVH, normal RVSF, mild MR, dilated ascending aorta (37 mm) Diabetes mellitus  Chronic kidney disease stage III Hypertension  Hyperlipidemia  Hx of syncope  Echocardiogram 07/2019: EF > 65 Transient HB on event monitor >> referred to EP beta-blocker DCd >> PPM indicated if further syncope off beta-blocker  Orthostatic hypotension  Ascending thoracic aortic aneurysm  CCTA 11/2021: 4.3 cm  Carotid artery disease [place] 12/2021: Bilat ICA 1-39   Prior CV studies:  Event monitor 07/2019 Normal sinus rhythm, sinus brady, 1 episode of CHB x 4 seconds   Echocardiogram 06/14/2019 EF >65, mild LVH, normal RVSF   PCI 06/23/05 2.5 x 16 mm Taxus DES to LCx   Cardiac catheterization 06/19/05 LM irregs LAD prox 30; D1 60-70 RI mild irregs LCx 90 RCA irregs; PDA irregs EF 50-55  History of Present Illness:   WOLF BOULAY is a 76 y.o. male with the above problem list.  He was last seen by Dr. Acie Fredrickson in April 2023. He returns for f/u. He is here with his wife.  He has noted dizziness over the last several months.  This only occurs when he is seated for a while and then stands up.  It passes quickly.  He has not had chest pain, significant shortness of breath, orthopnea.  He has not had syncope or near syncope.  He has some edema in his ankles that improves with elevation.  He has been fatigued.  He usually walks a mile.  Recently, he can only go 3 laps without stopping.         Past Medical History:  Diagnosis  Date   Arthritis    Colon polyp    adenomatous   Coronary artery disease    post PTCA and stenting   CORONARY ATHEROSCLEROSIS NATIVE CORONARY ARTERY 12/21/2009   Diabetes mellitus    Diverticulosis    History of kidney stones    Hyperlipidemia    Hypertension    Current Medications: Current Meds  Medication Sig   acetaminophen (TYLENOL) 500 MG tablet Take 500 mg by mouth every 6 (six) hours as needed for moderate pain.   aspirin 81 MG tablet Take 81 mg by mouth daily.   b complex vitamins capsule Take 1 capsule by mouth daily.   Docusate Sodium (COLACE PO) Take 1 capsule by mouth daily.   fenofibrate micronized (LOFIBRA) 134 MG capsule Take 134 mg by mouth daily before breakfast.    insulin aspart (NOVOLOG) 100 UNIT/ML injection Inject into the skin daily.   Insulin Human (INSULIN PUMP) SOLN Inject into the skin continuous. Using Novolog   JARDIANCE 25 MG TABS tablet Take 25 mg by mouth daily.    metFORMIN (GLUCOPHAGE) 850 MG tablet Take 850 mg by mouth 2 (two) times daily with a meal.   metoprolol tartrate (LOPRESSOR) 25 MG tablet Take 0.5 tablets (12.5 mg total) by mouth 2 (two) times daily.   multivitamin (ONE-A-DAY MEN'S) TABS tablet  Take 1 tablet by mouth daily.   nitroGLYCERIN (NITROSTAT) 0.4 MG SL tablet Place 1 tablet (0.4 mg total) under the tongue every 5 (five) minutes as needed for chest pain.   polyethylene glycol (MIRALAX / GLYCOLAX) 17 g packet Take 17 g by mouth daily as needed for moderate constipation.   Polyvinyl Alcohol-Povidone (REFRESH OP) Place 1 drop into both eyes daily as needed (dry eyes).   rosuvastatin (CRESTOR) 40 MG tablet Take 40 mg by mouth daily.   tobramycin (TOBREX) 0.3 % ophthalmic solution Place 1 drop into both eyes See admin instructions. Instill 1 drop into both eyes daily for the day before, the day of, and the day after eye injections   [DISCONTINUED] metoprolol tartrate (LOPRESSOR) 25 MG tablet Take 1 tablet (25 mg total) by mouth 2 (two)  times daily.    Allergies:   Oxycodone   Social History   Tobacco Use   Smoking status: Former    Types: Cigarettes    Quit date: 11/13/1985    Years since quitting: 36.8   Smokeless tobacco: Never  Vaping Use   Vaping Use: Never used  Substance Use Topics   Alcohol use: Yes    Alcohol/week: 0.0 standard drinks of alcohol    Comment: Occassionally   Drug use: No    Family Hx: The patient's family history includes Cancer in his father; Colon cancer in his mother. There is no history of Colon polyps, Kidney disease, Diabetes, or Esophageal cancer.  Review of Systems  Constitutional: Negative for chills and fever.  Respiratory:  Negative for cough.   Gastrointestinal:  Negative for anorexia, diarrhea, hematochezia and vomiting.  Genitourinary:  Negative for hematuria.     EKGs/Labs/Other Test Reviewed:    EKG:  EKG is  ordered today.  The ekg ordered today demonstrates NSR, HR 75, first-degree AV block, PR 242, normal axis, early repol in 2, 3, aVF, V6, nonspecific ST-T wave changes, QTc 435, no change from prior tracing   Recent Labs: 12/16/2021: ALT 24 12/21/2021: Magnesium 2.4 01/09/2022: BUN 19; Creatinine, Ser 1.46; Hemoglobin 13.9; Platelets 343; Potassium 4.6; Sodium 138   Recent Lipid Panel Recent Labs    12/21/21 0434  CHOL 56  TRIG 60  HDL 25*  VLDL 12  LDLCALC 19     Cardiac Studies & Procedures   CARDIAC CATHETERIZATION  CARDIAC CATHETERIZATION 12/02/2021  Narrative   Prox Cx lesion is 100% stenosed.   Prox LAD lesion is 50% stenosed.   Mid LM lesion is 50% stenosed.   Dist LAD lesion is 30% stenosed.   Mid RCA lesion is 80% stenosed.   LV end diastolic pressure is normal.   The left ventricular ejection fraction is 55-65% by visual estimate.  1.  Multivessel disease consisting of left main, LAD, collateralized chronically occluded left circumflex, and high-grade right coronary disease. 2.  Preserved ejection fraction with LVEDP of 15 mmHg.  The  patient will be referred for outpatient cardiothoracic surgical evaluation.  Findings Coronary Findings Diagnostic  Dominance: Right  Left Main Mid LM lesion is 50% stenosed.  Left Anterior Descending Prox LAD lesion is 50% stenosed. Dist LAD lesion is 30% stenosed.  Left Circumflex Prox Cx lesion is 100% stenosed. The lesion was previously treated .  Third Obtuse Marginal Branch Collaterals 3rd Mrg filled by collaterals from 2nd Diag.  Collaterals 3rd Mrg filled by collaterals from 1st Diag.  Right Coronary Artery There is mild diffuse disease throughout the vessel. Mid RCA lesion is 80% stenosed.  Intervention  No interventions have been documented.     ECHOCARDIOGRAM  ECHOCARDIOGRAM COMPLETE 12/08/2021  Narrative ECHOCARDIOGRAM REPORT    Patient Name:   GRIFFON HERBERG Date of Exam: 12/08/2021 Medical Rec #:  [id number]       Height:       71.5 in Accession #:    [id number]      Weight:       230.0 lb Date of Birth:  22-Jun-1946       BSA:          2.249 m Patient Age:    9 years        BP:           138/70 mmHg Patient Gender: M               HR:           72 bpm. Exam Location:  [address]  Procedure: 2D Echo, Cardiac Doppler and Color Doppler  Indications:    I25.10 Coronary artery disease - Native vessel; Z01.818 Pre-op testing  History:        Patient has prior history of Echocardiogram examinations, most recent 06/14/2019. Signs/Symptoms:Syncope; Risk Factors:Hypertension, Diabetes and Dyslipidemia. Orthostatic hypotension.  Sonographer:    Diamond Nickel RCS Referring Phys: [id number] [place]   1. Left ventricular ejection fraction, by estimation, is 60 to 65%. The left ventricle has normal function. The left ventricle has no regional wall motion abnormalities. There is moderate-to-severe hypertrophy of the basal septum. The rest of the LV segments demonstrate mild left ventricular hypertrophy. Left ventricular diastolic  parameters are consistent with Grade I diastolic dysfunction (impaired relaxation). 2. Right ventricular systolic function is normal. The right ventricular size is normal. Tricuspid regurgitation signal is inadequate for assessing PA pressure. 3. The mitral valve is normal in structure. Mild mitral valve regurgitation. 4. The aortic valve is tricuspid. There is mild calcification of the aortic valve. There is mild thickening of the aortic valve. Aortic valve regurgitation is not visualized. Aortic valve sclerosis/calcification is present, without any evidence of aortic stenosis. 5. Aortic dilatation noted. There is borderline dilatation of the ascending aorta, measuring 37 mm.  Comparison(s): Compared to prior TTE in 06/2019, there is no significant change.  FINDINGS Left Ventricle: Left ventricular ejection fraction, by estimation, is 60 to 65%. The left ventricle has normal function. The left ventricle has no regional wall motion abnormalities. The left ventricular internal cavity size was normal in size. There is moderate-to-severe hypertrophy of the basal septum. The rest of the LV segments demonstrate mild left ventricular hypertrophy. Left ventricular diastolic parameters are consistent with Grade I diastolic dysfunction (impaired relaxation).  Right Ventricle: The right ventricular size is normal. No increase in right ventricular wall thickness. Right ventricular systolic function is normal. Tricuspid regurgitation signal is inadequate for assessing PA pressure.  Left Atrium: Left atrial size was normal in size.  Right Atrium: Right atrial size was normal in size.  Pericardium: There is no evidence of pericardial effusion.  Mitral Valve: The mitral valve is normal in structure. There is mild thickening of the mitral valve leaflet(s). There is mild calcification of the mitral valve leaflet(s). Mild mitral annular calcification. Mild mitral valve regurgitation.  Tricuspid Valve: The  tricuspid valve is normal in structure. Tricuspid valve regurgitation is trivial.  Aortic Valve: The aortic valve is tricuspid. There is mild calcification of the aortic valve. There is mild thickening of the aortic valve. Aortic valve regurgitation is not  visualized. Aortic valve sclerosis/calcification is present, without any evidence of aortic stenosis.  Pulmonic Valve: The pulmonic valve was normal in structure. Pulmonic valve regurgitation is trivial.  Aorta: Aortic dilatation noted. There is borderline dilatation of the ascending aorta, measuring 37 mm.  Venous: The inferior vena cava was not well visualized.  IAS/Shunts: The atrial septum is grossly normal.   LEFT VENTRICLE PLAX 2D LVIDd:         3.30 cm   Diastology LVIDs:         2.10 cm   LV e' medial:    7.51 cm/s LV PW:         1.70 cm   LV E/e' medial:  12.7 LV IVS:        1.70 cm   LV e' lateral:   10.10 cm/s LVOT diam:     2.10 cm   LV E/e' lateral: 9.4 LV SV:         49 LV SV Index:   22 LVOT Area:     3.46 cm   RIGHT VENTRICLE RV Basal diam:  2.60 cm RV S prime:     14.90 cm/s TAPSE (M-mode): 2.6 cm  LEFT ATRIUM             Index        RIGHT ATRIUM           Index LA diam:        4.50 cm 2.00 cm/m   RA Area:     11.60 cm LA Vol (A2C):   69.0 ml 30.68 ml/m  RA Volume:   25.00 ml  11.11 ml/m LA Vol (A4C):   47.2 ml 20.98 ml/m LA Biplane Vol: 58.8 ml 26.14 ml/m AORTIC VALVE LVOT Vmax:   67.80 cm/s LVOT Vmean:  49.400 cm/s LVOT VTI:    0.142 m  AORTA Ao Root diam: 3.50 cm Ao Asc diam:  3.70 cm  MITRAL VALVE MV Area (PHT): 3.37 cm     SHUNTS MV Decel Time: 225 msec     Systemic VTI:  0.14 m MV E velocity: 95.40 cm/s   Systemic Diam: 2.10 cm MV A velocity: 100.00 cm/s MV E/A ratio:  0.95  Gwyndolyn Kaufman MD Electronically signed by Gwyndolyn Kaufman MD Signature Date/Time: 12/08/2021/3:45:22 PM    Final   TEE  ECHO INTRAOPERATIVE TEE 01/17/2022  Narrative *INTRAOPERATIVE  TRANSESOPHAGEAL REPORT *    Patient Name:   HERSHEY KNAUER Date of Exam: 12/20/2021 Medical Rec #:  [id number]       Height:       71.0 in Accession #:    [id number]      Weight:       230.0 lb Date of Birth:  May 11, 1946       BSA:          2.24 m Patient Age:    1 years        BP:           153/71 mmHg Patient Gender: M               HR:           69 bpm. Exam Location:  Inpatient  Transesophogeal exam was perform intraoperatively during surgical procedure. Patient was closely monitored under general anesthesia during the entirety of examination.  Indications:     coronary artery bypass surgery Sonographer:     [place] Performing Phys: [id number] Lucile Crater LIGHTFOOT Diagnosing Phys: Oleta Mouse MD  Complications: No known complications during this procedure. POST-OP IMPRESSIONS _ Left Ventricle: has normal systolic function, with an ejection fraction of 60%. The cavity size was normal. The wall motion is normal. _ Right Ventricle: normal function. The wall motion is normal. _ Aorta: there is no dissection present in the aorta. _ Aortic Valve: The aortic valve appears unchanged from pre-bypass. _ Mitral Valve: The mitral valve appears unchanged from pre-bypass. _ Tricuspid Valve: The tricuspid valve appears unchanged from pre-bypass.  PRE-OP FINDINGS Left Ventricle: The left ventricle has normal systolic function, with an ejection fraction of 55-60%. The cavity size was normal. No evidence of left ventricular regional wall motion abnormalities. There is mild concentric left ventricular hypertrophy.   Right Ventricle: The right ventricle has normal systolic function. The cavity was normal. There is no increase in right ventricular wall thickness.  Left Atrium: Left atrial size was normal in size. No left atrial/left atrial appendage thrombus was detected. The left atrial appendage is well visualized and there is evidence of thrombus present. The left atrial  appendage is well visualized and there is no evidence of thrombus present. Left atrial appendage velocity is normal at greater than 40 cm/s.  Right Atrium: Right atrial size was normal in size.  Interatrial Septum: No atrial level shunt detected by color flow Doppler. There is no evidence of a patent foramen ovale.  Pericardium: There is no evidence of pericardial effusion.  Mitral Valve: The mitral valve is normal in structure. Mitral valve regurgitation is moderate by color flow Doppler. There is No evidence of mitral stenosis. There is mild thickening and moderate calcification present on the mitral valve anterior and posterior cusps with normal mobility.  Tricuspid Valve: The tricuspid valve was normal in structure. Tricuspid valve regurgitation is trivial by color flow Doppler. No evidence of tricuspid stenosis is present. There is none and none present on the tricuspid valve anterior, posterior and septal cusps with normal mobility.  Aortic Valve: The aortic valve is tricuspid Aortic valve regurgitation was not visualized by color flow Doppler. There is no stenosis of the aortic valve, with a calculated valve area of 2.49 cm. There is mild thickening and mild calcification present on the aortic valve right coronary, left coronary and non-coronary cusps with normal mobility.  Pulmonic Valve: The pulmonic valve was normal in structure No evidence of pumonic stenosis. Pulmonic valve regurgitation is not visualized by color flow Doppler.   Aorta: There is mild dilatation of the ascending aorta, measuring 40 mm. There is evidence of plaque in the descending aorta; Grade V, >=68m and mobile. There is evidence of a dissection in the none.  +--------------+--------++ LEFT VENTRICLE         +--------------+--------++ PLAX 2D                +--------------+--------++ LVOT diam:    2.30 cm  +--------------+--------++ LVOT Area:    4.15 cm +--------------+--------++                         +--------------+--------++  +------------------+-----------++ AORTIC VALVE                  +------------------+-----------++ AV Area (Vmax):   2.87 cm    +------------------+-----------++ AV Area (Vmean):  2.74 cm    +------------------+-----------++ AV Area (VTI):    2.49 cm    +------------------+-----------++ AV Vmax:          94.70 cm/s  +------------------+-----------++ AV Vmean:  65.700 cm/s +------------------+-----------++ AV VTI:           0.220 m     +------------------+-----------++ AV Peak Grad:     3.6 mmHg    +------------------+-----------++ AV Mean Grad:     2.0 mmHg    +------------------+-----------++ LVOT Vmax:        65.50 cm/s  +------------------+-----------++ LVOT Vmean:       43.400 cm/s +------------------+-----------++ LVOT VTI:         0.132 m     +------------------+-----------++ LVOT/AV VTI ratio:0.60        +------------------+-----------++  +-------------+---------++ MITRAL VALVE           +--------------+-------+ +-------------+---------++ SHUNTS                MV Peak grad:2.0 mmHg  +--------------+-------+ +-------------+---------++ Systemic VTI: 0.13 m  MV Mean grad:1.0 mmHg  +--------------+-------+ +-------------+---------++ Systemic Diam:2.30 cm MV Vmax:     0.71 m/s  +--------------+-------+ +-------------+---------++ MV Vmean:    45.0 cm/s +-------------+---------++ MV VTI:      0.20 m    +-------------+---------++   Oleta Mouse MD Electronically signed by Oleta Mouse MD Signature Date/Time: 01/17/2022/7:17:36 PM    Final   MONITORS  CARDIAC EVENT MONITOR 08/05/2019  Narrative  Sinus rhythm including NSR and sinus brady  He had 1 episode of complete heart block that lasted 4 seconds  He has a history of syncope. Will refer to EP   CT SCANS  CT CORONARY MORPH W/CTA COR W/SCORE  11/23/2021  Addendum 11/23/2021  6:16 PM ADDENDUM REPORT: 11/23/2021 18:14  CLINICAL DATA:  76 Year old White Male  EXAM: Cardiac/Coronary  CTA  TECHNIQUE: The patient was scanned on a [employer].  FINDINGS: Scan was triggered in the descending thoracic aorta. Axial non-contrast 3 mm slices were carried out through the heart. The data set was analyzed on a dedicated work station and scored using the [place]. Gantry rotation speed was 250 msecs and collimation was .6 mm. 0.8 mg of sl NTG was given. The 3D data set was reconstructed in 5% intervals of the 67-82 % of the R-R cycle. Diastolic phases were analyzed on a dedicated work station using MPR, MIP and VRT modes. The patient received 95 cc of contrast.  Aorta: Mild dilated thoracic aorta. Aortic atherosclerosis. No dissection.  Main Pulmonary Artery: Normal size of the pulmonary artery.  Aortic Valve:  Tri-leaflet.  No calcifications.  Coronary Arteries:  Normal coronary origin.  Right dominance.  Coronary Calcium Score:  Left main: 650  Left anterior descending artery: 1141  Left circumflex artery: 783  Right coronary artery: 1572  Total: 4147  Percentile: 96th for age, sex, and race matched control.  RCA is a large dominant artery that gives rise to PDA and PLA. There are multiple moderate RCA mixed plaque stenoses throughout the proximal and mid RCA.  Left main is a large artery that gives rise to LAD and LCX arteries. There is approaching 50% stenosis calcified plaque in the mid-distal left main.  LAD is a large vessel that gives rise to two diagonal vessels. There is severe calcified stenosis in the proximal and mid LAD. Moderate calcified stenosis distal LAD and in the proximal D2. Mild calcified plaque in the D1 (small vessel).  LCX is a non-dominant artery. Moderate calcified stenosis proximal vessel. There is severe soft plaque stenosis in the mid vessel.  Other  findings:  Normal pulmonary vein drainage into the left atrium.  Normal left atrial appendage  without a thrombus.  Extra-cardiac findings: See attached radiology report for non-cardiac structures.  IMPRESSION: 1. Coronary calcium score of 4147. This was 96th percentile for age, sex, and race matched control.  2. Normal coronary origin with right dominance.  3. CAD-RADS 4 Severe stenosis. (70-99% or > 50% left main). Cardiac catheterization or CT FFR is recommended. Consider symptom-guided anti-ischemic pharmacotherapy as well as risk factor modification per guideline directed care.  4. Mild dilated thoracic aorta.  RECOMMENDATIONS:  Coronary artery calcium (CAC) score is a strong predictor of incident coronary heart disease (CHD) and provides predictive information beyond traditional risk factors. CAC scoring is reasonable to use in the decision to withhold, postpone, or initiate statin therapy in intermediate-risk or selected borderline-risk asymptomatic adults (age 34-75 years and LDL-C >=70 to <190 mg/dL) who do not have diabetes or established atherosclerotic cardiovascular disease (ASCVD).* In intermediate-risk (10-year ASCVD risk >=7.5% to <20%) adults or selected borderline-risk (10-year ASCVD risk >=5% to <7.5%) adults in whom a CAC score is measured for the purpose of making a treatment decision the following recommendations have been made:  If CAC = 0, it is reasonable to withhold statin therapy and reassess in 5 to 10 years, as long as higher risk conditions are absent (diabetes mellitus, family history of premature CHD in first degree relatives (males <55 years; females <65 years), cigarette smoking, LDL >=190 mg/dL or other independent risk factors).  If CAC is 1 to 99, it is reasonable to initiate statin therapy for patients >=36 years of age.  If CAC is >=100 or >=75th percentile, it is reasonable to initiate statin therapy at any age.  Cardiology  referral should be considered for patients with CAC scores =400 or >=75th percentile.  *2018 AHA/ACC/AACVPR/AAPA/ABC/ACPM/ADA/AGS/APhA/ASPC/NLA/PCNA Guideline on the Management of Blood Cholesterol: A Report of the American College of Cardiology/American Heart Association Task Force on Clinical Practice Guidelines. J Am Coll Cardiol. 2019;73(24):3168-3209.  Rudean Haskell, MD   Electronically Signed By: Rudean Haskell M.D. On: 11/23/2021 18:14  Narrative EXAM: OVER-READ INTERPRETATION  CT CHEST  The following report is an over-read performed by radiologist Dr. Salvatore Marvel of [facility], [place] on 11/23/2021. This over-read does not include interpretation of cardiac or coronary anatomy or pathology. The coronary CTA interpretation by the cardiologist is attached.  COMPARISON:  None.  FINDINGS: Cardiovascular: Normal heart size. No significant pericardial effusion/thickening. Atherosclerotic thoracic aorta with dilated 4.3 cm ascending thoracic aorta. Normal caliber pulmonary arteries. No central pulmonary emboli.  Mediastinum/Nodes: Unremarkable esophagus. No pathologically enlarged mediastinal or hilar lymph nodes.  Lungs/Pleura: No pneumothorax. No pleural effusion. No acute consolidative airspace disease or lung masses. Two tiny peripheral right middle lobe solid pulmonary nodules, largest 3 mm (series 11/image 8).  Upper abdomen: No acute abnormality.  Musculoskeletal: No aggressive appearing focal osseous lesions. Mild thoracic spondylosis.  IMPRESSION: 1. 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 Recommend annual imaging followup by CTA or MRA. This recommendation follows 2010 ACCF/AHA/AATS/ACR/ASA/SCA/SCAI/SIR/STS/SVM Guidelines for the Diagnosis and Management of Patients with Thoracic Aortic Disease. Circulation. 2010; 121: A416-S063. Aortic aneurysm NOS (ICD10-I71.9). 2. Two tiny peripheral right middle lobe pulmonary nodules, largest 3  mm. No follow-up needed if patient is low-risk (and has no known or suspected primary neoplasm). Non-contrast chest CT can be considered in 12 months if patient is high-risk. This recommendation follows the consensus statement: Guidelines for Management of Incidental Pulmonary Nodules Detected on CT Images: From the Fleischner Society 2017; Radiology 2017; 284:228-243. 3. Aortic Atherosclerosis (ICD10-I70.0).  Electronically Signed: By:  Ilona Sorrel M.D. On: 11/23/2021 17:16           Risk Assessment/Calculations/Metrics:              Physical Exam:    VS:  BP 121/60   Pulse 75   Ht 5' 11.5" (1.816 m)   Wt 224 lb 12.8 oz (102 kg)   SpO2 98%   BMI 30.92 kg/m     Wt Readings from Last 3 Encounters:  09/13/22 224 lb 12.8 oz (102 kg)  05/22/22 222 lb 10.6 oz (101 kg)  05/15/22 223 lb 15.8 oz (101.6 kg)    Constitutional:      Appearance: Healthy appearance. Not in distress.  Neck:     Vascular: No JVR. JVD normal.  Pulmonary:     Effort: Pulmonary effort is normal.     Breath sounds: No wheezing. No rales.  Cardiovascular:     Normal rate. Regular rhythm. Normal S1. Normal S2.      Murmurs: There is no murmur.  Edema:    Peripheral edema present.    Ankle: bilateral trace edema of the ankle. Abdominal:     Palpations: Abdomen is soft.  Skin:    General: Skin is warm and dry.  Neurological:     General: No focal deficit present.     Mental Status: Alert and oriented to person, place and time.          ASSESSMENT & PLAN:   Orthostatic hypotension He has noted recurrent symptoms of dizziness when going from a seated position to standing. He has not had syncope. He has had some fatigue and exercise intolerance as well. He notes his HR does not go > 85 when exercising. 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  Question if he could be having side effects (fatigue) related to beta-blocker therapy.  We did  check his blood pressure today sitting to standing (114/71 >>109/66 >>120/72; HR 73>>77>>82). Decrease metoprolol tartrate to 12.5 mg twice daily CMET, CBC Compression hose Follow-up 6 weeks  CAD (coronary artery disease) History of DES to the LCx in 2006.  He subsequently underwent bypass in February 2023.  He was previously on amiodarone due to postop A-fib.  This was ultimately stopped.  He does remain on metoprolol tartrate 25 mg twice daily.  As noted, given his orthostatic intolerance, I will decrease his dose of metoprolol tartrate.  He will follow-up in 6 weeks.  If his symptoms continue, we may need to stop his beta-blocker.  He has not had any episodes of syncope.  He has not had chest pain or shortness of breath that sounds concerning for angina.  His electrocardiogram is unchanged.  Continue aspirin 81 mg daily, Crestor 40 mg daily.  Hyperlipidemia Lipids optimal.  Continue Crestor 40 mg daily.  Stage 3a chronic kidney disease ([place]) Obtain follow-up CMET today.  Thoracic aortic aneurysm (HCC) 4.3 cm on CT in January 2023.  He will need a repeat CT in January 2024.            Dispo:  Return in about 6 weeks (around 10/25/2022) for Routine Follow Up, w/ Richardson Dopp, PA-C.   Medication Adjustments/Labs and Tests Ordered: Current medicines are reviewed at length with the patient today.  Concerns regarding medicines are outlined above.  Tests Ordered: Orders Placed This Encounter  Procedures   Comp Met (CMET)   CBC   EKG 12-Lead  Medication Changes: Meds ordered this encounter  Medications   metoprolol tartrate (LOPRESSOR) 25 MG tablet    Sig: Take 0.5 tablets (12.5 mg total) by mouth 2 (two) times daily.    Dispense:  90 tablet    Refill:  3   Signed, Richardson Dopp, PA-C  09/13/2022 10:23 AM    [facility] [address], [place], [place]  [address] Phone: [phone]; Fax: [phone]

## 2022-09-13 ENCOUNTER — Encounter: Payer: Self-pay | Admitting: Physician Assistant

## 2022-09-13 ENCOUNTER — Ambulatory Visit: Payer: Medicare Other | Attending: Physician Assistant | Admitting: Physician Assistant

## 2022-09-13 VITALS — BP 121/60 | HR 75 | Ht 71.5 in | Wt 224.8 lb

## 2022-09-13 DIAGNOSIS — N1831 Chronic kidney disease, stage 3a: Secondary | ICD-10-CM

## 2022-09-13 DIAGNOSIS — I7121 Aneurysm of the ascending aorta, without rupture: Secondary | ICD-10-CM

## 2022-09-13 DIAGNOSIS — I712 Thoracic aortic aneurysm, without rupture, unspecified: Secondary | ICD-10-CM | POA: Insufficient documentation

## 2022-09-13 DIAGNOSIS — I951 Orthostatic hypotension: Secondary | ICD-10-CM

## 2022-09-13 DIAGNOSIS — E782 Mixed hyperlipidemia: Secondary | ICD-10-CM | POA: Diagnosis not present

## 2022-09-13 DIAGNOSIS — I2510 Atherosclerotic heart disease of native coronary artery without angina pectoris: Secondary | ICD-10-CM | POA: Diagnosis not present

## 2022-09-13 MED ORDER — METOPROLOL TARTRATE 25 MG PO TABS
12.5000 mg | ORAL_TABLET | Freq: Two times a day (BID) | ORAL | 3 refills | Status: DC
Start: 2022-09-13 — End: 2022-10-25

## 2022-09-13 NOTE — Assessment & Plan Note (Addendum)
He has noted recurrent symptoms of dizziness when going from a seated position to standing. He has not had syncope. He has had some fatigue and exercise intolerance as well. He notes his HR does not go > 85 when exercising. 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  Question if he could be having side effects (fatigue) related to beta-blocker therapy.  We did check his blood pressure today sitting to standing (114/71 >>109/66 >>120/72; HR 73>>77>>82). Decrease metoprolol tartrate to 12.5 mg twice daily CMET, CBC Compression hose Follow-up 6 weeks

## 2022-09-13 NOTE — Assessment & Plan Note (Signed)
Obtain follow-up CMET today.

## 2022-09-13 NOTE — Assessment & Plan Note (Signed)
4.3 cm on CT in January 2023.  He will need a repeat CT in January 2024.

## 2022-09-13 NOTE — Assessment & Plan Note (Addendum)
Lipids optimal.  Continue Crestor 40 mg daily.

## 2022-09-13 NOTE — Assessment & Plan Note (Signed)
History of DES to the LCx in 2006.  He subsequently underwent bypass in February 2023.  He was previously on amiodarone due to postop A-fib.  This was ultimately stopped.  He does remain on metoprolol tartrate 25 mg twice daily.  As noted, given his orthostatic intolerance, I will decrease his dose of metoprolol tartrate.  He will follow-up in 6 weeks.  If his symptoms continue, we may need to stop his beta-blocker.  He has not had any episodes of syncope.  He has not had chest pain or shortness of breath that sounds concerning for angina.  His electrocardiogram is unchanged.  Continue aspirin 81 mg daily, Crestor 40 mg daily.

## 2022-09-13 NOTE — Patient Instructions (Addendum)
Medication Instructions:  Your physician has recommended you make the following change in your medication:   REDUCE the Metoprolol to 25 taking 1/2 tablet twice a day  *If you need a refill on your cardiac medications before your next appointment, please call your pharmacy*   Lab Work: TODAY:  CMET & CBC  If you have labs (blood work) drawn today and your tests are completely normal, you will receive your results only by: [place] (if you have MyChart) OR A paper copy in the mail If you have any lab test that is abnormal or we need to change your treatment, we will call you to review the results.   Testing/Procedures: None ordered   Follow-Up: At [facility], you and your health needs are our priority.  As part of our continuing mission to provide you with exceptional heart care, we have created designated Provider Care Teams.  These Care Teams include your primary Cardiologist (physician) and Advanced Practice Providers (APPs -  Physician Assistants and Nurse Practitioners) who all work together to provide you with the care you need, when you need it.  We recommend signing up for the patient portal called "MyChart".  Sign up information is provided on this After Visit Summary.  MyChart is used to connect with patients for Virtual Visits (Telemedicine).  Patients are able to view lab/test results, encounter notes, upcoming appointments, etc.  Non-urgent messages can be sent to your provider as well.   To learn more about what you can do with MyChart, go to [email or web address].    Your next appointment:   6 week(s)  10/25/22 ARRIVE AT 9:00   The format for your next appointment:   In Person  Provider:   Richardson Dopp, PA-C         Other Instructions Make sure to wear your compression stockings, knee high, when you're out and about doing things.    Important Information About Sugar

## 2022-09-14 LAB — COMPREHENSIVE METABOLIC PANEL
ALT: 24 IU/L (ref 0–44)
AST: 20 IU/L (ref 0–40)
Albumin/Globulin Ratio: 1.9 (ref 1.2–2.2)
Albumin: 4.3 g/dL (ref 3.8–4.8)
Alkaline Phosphatase: 45 IU/L (ref 44–121)
BUN/Creatinine Ratio: 13 (ref 10–24)
BUN: 17 mg/dL (ref 8–27)
Bilirubin Total: 0.3 mg/dL (ref 0.0–1.2)
CO2: 23 mmol/L (ref 20–29)
Calcium: 9.8 mg/dL (ref 8.6–10.2)
Chloride: 105 mmol/L (ref 96–106)
Creatinine, Ser: 1.3 mg/dL — ABNORMAL HIGH (ref 0.76–1.27)
Globulin, Total: 2.3 g/dL (ref 1.5–4.5)
Glucose: 146 mg/dL — ABNORMAL HIGH (ref 70–99)
Potassium: 4.7 mmol/L (ref 3.5–5.2)
Sodium: 141 mmol/L (ref 134–144)
Total Protein: 6.6 g/dL (ref 6.0–8.5)
eGFR: 57 mL/min/{1.73_m2} — ABNORMAL LOW (ref >59)

## 2022-09-14 LAB — CBC
Hematocrit: 49.9 % (ref 37.5–51.0)
Hemoglobin: 16.2 g/dL (ref 13.0–17.7)
MCH: 30.6 pg (ref 26.6–33.0)
MCHC: 32.5 g/dL (ref 31.5–35.7)
MCV: 94 fL (ref 79–97)
Platelets: 243 10*3/uL (ref 150–450)
RBC: 5.29 x10E6/uL (ref 4.14–5.80)
RDW: 12.7 % (ref 11.6–15.4)
WBC: 10.1 10*3/uL (ref 3.4–10.8)

## 2022-09-18 NOTE — Progress Notes (Signed)
Pt has been made aware of normal result and verbalized understanding.  jw

## 2022-09-27 DIAGNOSIS — I2510 Atherosclerotic heart disease of native coronary artery without angina pectoris: Secondary | ICD-10-CM | POA: Diagnosis not present

## 2022-09-27 DIAGNOSIS — N1831 Chronic kidney disease, stage 3a: Secondary | ICD-10-CM | POA: Diagnosis not present

## 2022-09-27 DIAGNOSIS — I1310 Hypertensive heart and chronic kidney disease without heart failure, with stage 1 through stage 4 chronic kidney disease, or unspecified chronic kidney disease: Secondary | ICD-10-CM | POA: Diagnosis not present

## 2022-09-27 DIAGNOSIS — E1129 Type 2 diabetes mellitus with other diabetic kidney complication: Secondary | ICD-10-CM | POA: Diagnosis not present

## 2022-10-18 DIAGNOSIS — L570 Actinic keratosis: Secondary | ICD-10-CM | POA: Diagnosis not present

## 2022-10-18 DIAGNOSIS — D225 Melanocytic nevi of trunk: Secondary | ICD-10-CM | POA: Diagnosis not present

## 2022-10-18 DIAGNOSIS — L821 Other seborrheic keratosis: Secondary | ICD-10-CM | POA: Diagnosis not present

## 2022-10-18 DIAGNOSIS — Z85828 Personal history of other malignant neoplasm of skin: Secondary | ICD-10-CM | POA: Diagnosis not present

## 2022-10-24 NOTE — Progress Notes (Unsigned)
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 male with the above problem list.  He was last seen 09/13/2022 for symptoms of orthostatic intolerance.  His orthostatic vital signs in the office were not positive.  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  He returns for follow-up.  He is here with his wife.  Since last seen, he had 1 episode of dizziness/near syncope with associated shortness of breath.  This occurred while they were shopping at [facility].  He had to sit down while she finished shopping.  He has not had any further episodes since that time.  He has not had chest pain, shortness of breath, syncope, orthopnea, leg edema.      EKG:  not done    Reviewed and updated this encounter:  Tobacco  Allergies  Meds  Problems  Med Hx  Surg Hx  Fam Hx     ROS  Labs/Other Test Reviewed:   Recent Labs: 12/21/2021: Magnesium 2.4 09/13/2022: ALT 24; BUN 17; Creatinine, Ser 1.30; Hemoglobin  16.2; Platelets 243; Potassium 4.7; Sodium 141  Recent Lipid Panel Recent Labs    12/21/21 0434  CHOL 56  TRIG 60  HDL 25*  VLDL 12  LDLCALC 19    Risk Assessment/Calculations/Metrics:             Physical Exam:   VS:  BP 130/70   Pulse 86   Ht '5\' 11"'$  (1.803 m)   Wt 224 lb (101.6 kg)   SpO2 97%   BMI 31.24 kg/m    Wt Readings from Last 3 Encounters:  10/25/22 224 lb (101.6 kg)  09/13/22 224 lb 12.8 oz (102 kg)  05/22/22 222 lb 10.6 oz (101 kg)    Constitutional:      Appearance: Healthy appearance. Not in distress.  Neck:     Vascular: JVD normal.  Pulmonary:     Effort: Pulmonary effort is normal.     Breath sounds: No wheezing. No rales.  Cardiovascular:     Normal rate. Regular rhythm. Normal S1. Normal S2.      Murmurs: There is no murmur.  Edema:    Peripheral edema absent.  Abdominal:     Palpations: There is no hepatomegaly.         ASSESSMENT & PLAN:   Near syncope He has a history of transient heart block noted on event monitor in the past which prompted discontinuation of  beta-blocker.  He was placed on beta-blocker therapy after his bypass surgery in February 2023.  I saw him recently for dizziness and reduced his dose of beta-blocker.  He had 1 other episode of near syncope since that time.  He has not had further symptoms since then.  Given his prior history of transient heart block in the setting of beta-blocker therapy and recurrent symptoms, I have recommended discontinuing his beta-blocker completely.  He will take 1 further dose of metoprolol tartrate 12.5 mg tomorrow morning and then discontinue.  I will have him follow-up with a ZIO XT monitor for 14 days to rule out residual bradycardia arrhythmias in the absence of beta-blocker therapy.  Return in April 2024 for routine follow-up or sooner if recurrent symptoms/abnormal monitor.  Thoracic aortic aneurysm (HCC) 4.3 cm by CT in January 2023.  Repeat CT will be obtained in January 2024.  CAD  (coronary artery disease) History of DES to the LCx in 2006.  He subsequently underwent bypass in February 2023.  He is doing well without chest pain to suggest angina.  Continue aspirin 81 mg daily, Crestor 40 mg daily.           Dispo:  Return in about 4 months (around 02/24/2023) for Routine Follow Up, w/ Dr. Acie Fredrickson.  Medication Adjustments/Labs and Tests Ordered: Current medicines are reviewed at length with the patient today.  Concerns regarding medicines are outlined above.  Tests Ordered: Orders Placed This Encounter  Procedures   CT ANGIO CHEST AORTA W/ & OR WO/CM & GATING ([place] ONLY)   Basic metabolic panel   LONG TERM MONITOR (3-14 DAYS)   Medication Changes: Meds ordered this encounter  Medications   metoprolol tartrate (LOPRESSOR) 25 MG tablet    Sig: Take 1/2 tablet by mouth for 1 day then stop    Dispense:  180 tablet    Refill:  3   Signed, Richardson Dopp, PA-C  10/25/2022 1:28 PM    [facility], [place], [place]  [address] Phone: [phone]; Fax: [phone]

## 2022-10-25 ENCOUNTER — Ambulatory Visit (INDEPENDENT_AMBULATORY_CARE_PROVIDER_SITE_OTHER): Payer: Medicare Other

## 2022-10-25 ENCOUNTER — Encounter: Payer: Self-pay | Admitting: Physician Assistant

## 2022-10-25 ENCOUNTER — Ambulatory Visit: Payer: Medicare Other | Attending: Physician Assistant | Admitting: Physician Assistant

## 2022-10-25 VITALS — BP 130/70 | HR 86 | Ht 71.0 in | Wt 224.0 lb

## 2022-10-25 DIAGNOSIS — I7121 Aneurysm of the ascending aorta, without rupture: Secondary | ICD-10-CM | POA: Diagnosis not present

## 2022-10-25 DIAGNOSIS — I951 Orthostatic hypotension: Secondary | ICD-10-CM

## 2022-10-25 DIAGNOSIS — R55 Syncope and collapse: Secondary | ICD-10-CM

## 2022-10-25 DIAGNOSIS — I2510 Atherosclerotic heart disease of native coronary artery without angina pectoris: Secondary | ICD-10-CM

## 2022-10-25 MED ORDER — METOPROLOL TARTRATE 25 MG PO TABS: ORAL_TABLET | ORAL | 3 refills | Status: DC

## 2022-10-25 NOTE — Assessment & Plan Note (Signed)
History of DES to the LCx in 2006.  He subsequently underwent bypass in February 2023.  He is doing well without chest pain to suggest angina.  Continue aspirin 81 mg daily, Crestor 40 mg daily.

## 2022-10-25 NOTE — Patient Instructions (Addendum)
Medication Instructions:  Your physician has recommended you make the following change in your medication:   REDUCE the Lopressor to 1/2 tablet tomorrow then stop after that   *If you need a refill on your cardiac medications before your next appointment, please call your pharmacy*   Lab Work: None ordered  If you have labs (blood work) drawn today and your tests are completely normal, you will receive your results only by: [place] (if you have MyChart) OR A paper copy in the mail If you have any lab test that is abnormal or we need to change your treatment, we will call you to review the results.   Testing/Procedures:  Your physician recommends that you have a Chest CTA in January 2024.  Non-Cardiac CT Angiography (CTA), is a special type of CT scan that uses a computer to produce multi-dimensional views of major blood vessels throughout the body. In CT angiography, a contrast material is injected through an IV to help visualize the blood vessels  ZIO XT- Long Term Monitor Instructions   Your physician has requested you wear your ZIO patch monitor 14 days.   This is a single patch monitor.  [employer] supplies one patch monitor per enrollment.  Additional stickers are not available.   Please do not apply patch if you will be having a Nuclear Stress Test, Echocardiogram, Cardiac CT, MRI, or Chest Xray during the time frame you would be wearing the monitor. The patch cannot be worn during these tests.  You cannot remove and re-apply the ZIO XT patch monitor.   Your ZIO patch monitor will be sent USPS Priority mail from [facility] directly to your home address. The monitor may also be mailed to a PO BOX if home delivery is not available.   It may take 3-5 days to receive your monitor after you have been enrolled.   Once you have received you monitor, please review enclosed instructions.  Your monitor has already been registered assigning a specific monitor serial # to  you.   Applying the monitor   Shave hair from upper left chest.   Hold abrader disc by orange tab.  Rub abrader in 40 strokes over left upper chest as indicated in your monitor instructions.   Clean area with 4 enclosed alcohol pads .  Use all pads to assure are is cleaned thoroughly.  Let dry.   Apply patch as indicated in monitor instructions.  Patch will be place under collarbone on left side of chest with arrow pointing upward.   Rub patch adhesive wings for 2 minutes.Remove white label marked "1".  Remove white label marked "2".  Rub patch adhesive wings for 2 additional minutes.   While looking in a mirror, press and release button in center of patch.  A small green light will flash 3-4 times .  This will be your only indicator the monitor has been turned on.     Do not shower for the first 24 hours.  You may shower after the first 24 hours.   Press button if you feel a symptom. You will hear a small click.  Record Date, Time and Symptom in the Patient Log Book.   When you are ready to remove patch, follow instructions on last 2 pages of Patient Log Book.  Stick patch monitor onto last page of Patient Log Book.   Place Patient Log Book in [place] box.  Use locking tab on box and tape box closed securely.  The [place] [employer]  box has prepaid postage on it.  Please place in mailbox as soon as possible.  Your physician should have your test results approximately 7 days after the monitor has been mailed back to [facility].   Call [place] at [phone] if you have questions regarding your ZIO XT patch monitor.  Call them immediately if you see an orange light blinking on your monitor.   If your monitor falls off in less than 4 days contact our Monitor department at [phone].  If your monitor becomes loose or falls off after 4 days call [employer] at [phone] for suggestions on securing your monitor.    Follow-Up: At [facility], you and your  health needs are our priority.  As part of our continuing mission to provide you with exceptional heart care, we have created designated Provider Care Teams.  These Care Teams include your primary Cardiologist (physician) and Advanced Practice Providers (APPs -  Physician Assistants and Nurse Practitioners) who all work together to provide you with the care you need, when you need it.  We recommend signing up for the patient portal called "MyChart".  Sign up information is provided on this After Visit Summary.  MyChart is used to connect with patients for Virtual Visits (Telemedicine).  Patients are able to view lab/test results, encounter notes, upcoming appointments, etc.  Non-urgent messages can be sent to your provider as well.   To learn more about what you can do with MyChart, go to [email or web address].    Your next appointment:   April 2024  The format for your next appointment:   In Person  Provider:   Mertie Moores, MD     Other Instructions   Important Information About Sugar

## 2022-10-25 NOTE — Progress Notes (Unsigned)
Enrolled for [employer] to mail a ZIO XT long term holter monitor to the patients address on file.   Dr. Nahser to read. 

## 2022-10-25 NOTE — Assessment & Plan Note (Signed)
4.3 cm by CT in January 2023.  Repeat CT will be obtained in January 2024.

## 2022-10-25 NOTE — Assessment & Plan Note (Signed)
He has a history of transient heart block noted on event monitor in the past which prompted discontinuation of beta-blocker.  He was placed on beta-blocker therapy after his bypass surgery in February 2023.  I saw him recently for dizziness and reduced his dose of beta-blocker.  He had 1 other episode of near syncope since that time.  He has not had further symptoms since then.  Given his prior history of transient heart block in the setting of beta-blocker therapy and recurrent symptoms, I have recommended discontinuing his beta-blocker completely.  He will take 1 further dose of metoprolol tartrate 12.5 mg tomorrow morning and then discontinue.  I will have him follow-up with a ZIO XT monitor for 14 days to rule out residual bradycardia arrhythmias in the absence of beta-blocker therapy.  Return in April 2024 for routine follow-up or sooner if recurrent symptoms/abnormal monitor.

## 2022-10-29 DIAGNOSIS — R55 Syncope and collapse: Secondary | ICD-10-CM | POA: Diagnosis not present

## 2022-10-30 DIAGNOSIS — E113391 Type 2 diabetes mellitus with moderate nonproliferative diabetic retinopathy without macular edema, right eye: Secondary | ICD-10-CM | POA: Diagnosis not present

## 2022-10-30 DIAGNOSIS — H43811 Vitreous degeneration, right eye: Secondary | ICD-10-CM | POA: Diagnosis not present

## 2022-10-30 DIAGNOSIS — H35371 Puckering of macula, right eye: Secondary | ICD-10-CM | POA: Diagnosis not present

## 2022-10-30 DIAGNOSIS — E113312 Type 2 diabetes mellitus with moderate nonproliferative diabetic retinopathy with macular edema, left eye: Secondary | ICD-10-CM | POA: Diagnosis not present

## 2022-11-20 DIAGNOSIS — R55 Syncope and collapse: Secondary | ICD-10-CM | POA: Diagnosis not present

## 2022-11-21 DIAGNOSIS — K080 Exfoliation of teeth due to systemic causes: Secondary | ICD-10-CM | POA: Diagnosis not present

## 2022-11-22 ENCOUNTER — Telehealth: Payer: Self-pay | Admitting: *Deleted

## 2022-11-22 DIAGNOSIS — Z79899 Other long term (current) drug therapy: Secondary | ICD-10-CM

## 2022-11-22 MED ORDER — APIXABAN 5 MG PO TABS: 5.0000 mg | ORAL_TABLET | Freq: Two times a day (BID) | ORAL | 3 refills | Status: DC

## 2022-11-22 NOTE — Telephone Encounter (Signed)
Pt has been made aware of his monitor results.  He will start Eliquis 5 mg bid.  Prescription has been sent to [facility].  Will get 2 weeks of new pt Eliquis samples at the front desk for the pt.   He thanked me for the call.

## 2022-11-22 NOTE — Telephone Encounter (Signed)
-----   Message from Liliane Shi, [place] sent at 11/22/2022  1:42 PM EST ----- Monitor shows 1 episode of atrial flutter. This increases his overall risk of stroke. I reviewed with Dr. Acie Fredrickson. We agree that he should start on anticoagulation. PLAN:  -Start Apixaban 5 mg twice daily  -F/u with me or Dr. Acie Fredrickson in 4-6 weeks with a BMET, CBC -Provide 2 weeks of samples (new start) and co-pay cards Richardson Dopp, PA-C    11/22/2022 1:39 PM

## 2022-11-23 ENCOUNTER — Telehealth: Payer: Self-pay | Admitting: Cardiovascular Disease

## 2022-11-23 DIAGNOSIS — Z125 Encounter for screening for malignant neoplasm of prostate: Secondary | ICD-10-CM | POA: Diagnosis not present

## 2022-11-23 DIAGNOSIS — E1129 Type 2 diabetes mellitus with other diabetic kidney complication: Secondary | ICD-10-CM | POA: Diagnosis not present

## 2022-11-23 DIAGNOSIS — E7800 Pure hypercholesterolemia, unspecified: Secondary | ICD-10-CM | POA: Diagnosis not present

## 2022-11-23 NOTE — Telephone Encounter (Signed)
Patient called to make sure it was okay for him to take his medications on the day of his CT. Informed patient that he should be fine, that we usually do not have patient's hold any medications for this test. Patient verbalized understanding.

## 2022-11-23 NOTE — Telephone Encounter (Signed)
Patient is having a CTA done on 1/17 and wants to know if any medication needs to be withheld

## 2022-11-24 ENCOUNTER — Other Ambulatory Visit: Payer: Medicare Other

## 2022-11-28 ENCOUNTER — Telehealth: Payer: Self-pay | Admitting: Cardiovascular Disease

## 2022-11-28 NOTE — Telephone Encounter (Signed)
New Message:      Patient is calling to see if you received his lab results from his primary doctor?. It was supposed to be faxed over today.

## 2022-11-28 NOTE — Telephone Encounter (Signed)
Pt called back per message received in [place] Triage office.    Lab Fax received from [facility]; I will have these lab results scanned into the patients chart.    Voicemail message left for Pt that we DID receive his lab results for Dr. Elmarie Shiley review.  No follow up required at this time.

## 2022-11-29 ENCOUNTER — Ambulatory Visit (HOSPITAL_COMMUNITY)
Admission: RE | Admit: 2022-11-29 | Discharge: 2022-11-29 | Disposition: A | Discharge status: Home / Self Care | Payer: Medicare Other | Source: Ambulatory Visit | Attending: Physician Assistant | Admitting: Physician Assistant

## 2022-11-29 DIAGNOSIS — I7121 Aneurysm of the ascending aorta, without rupture: Secondary | ICD-10-CM | POA: Diagnosis not present

## 2022-11-29 MED ORDER — IOHEXOL 350 MG/ML SOLN
75.0000 mL | Freq: Once | INTRAVENOUS | Status: AC | PRN
  Administered 2022-11-29: 75 mL via INTRAVENOUS

## 2022-11-30 ENCOUNTER — Encounter: Payer: Self-pay | Admitting: Physician Assistant

## 2022-11-30 ENCOUNTER — Telehealth: Payer: Self-pay | Admitting: *Deleted

## 2022-11-30 DIAGNOSIS — R911 Solitary pulmonary nodule: Secondary | ICD-10-CM

## 2022-11-30 DIAGNOSIS — Z1331 Encounter for screening for depression: Secondary | ICD-10-CM | POA: Diagnosis not present

## 2022-11-30 DIAGNOSIS — Z0000 Encounter for general adult medical examination without abnormal findings: Secondary | ICD-10-CM | POA: Diagnosis not present

## 2022-11-30 DIAGNOSIS — R918 Other nonspecific abnormal finding of lung field: Secondary | ICD-10-CM | POA: Insufficient documentation

## 2022-11-30 DIAGNOSIS — I7121 Aneurysm of the ascending aorta, without rupture: Secondary | ICD-10-CM

## 2022-11-30 DIAGNOSIS — I1310 Hypertensive heart and chronic kidney disease without heart failure, with stage 1 through stage 4 chronic kidney disease, or unspecified chronic kidney disease: Secondary | ICD-10-CM | POA: Diagnosis not present

## 2022-11-30 DIAGNOSIS — R82998 Other abnormal findings in urine: Secondary | ICD-10-CM | POA: Diagnosis not present

## 2022-11-30 DIAGNOSIS — E1165 Type 2 diabetes mellitus with hyperglycemia: Secondary | ICD-10-CM | POA: Diagnosis not present

## 2022-11-30 DIAGNOSIS — E1129 Type 2 diabetes mellitus with other diabetic kidney complication: Secondary | ICD-10-CM | POA: Diagnosis not present

## 2022-11-30 DIAGNOSIS — Z1339 Encounter for screening examination for other mental health and behavioral disorders: Secondary | ICD-10-CM | POA: Diagnosis not present

## 2022-11-30 DIAGNOSIS — E11319 Type 2 diabetes mellitus with unspecified diabetic retinopathy without macular edema: Secondary | ICD-10-CM | POA: Diagnosis not present

## 2022-11-30 HISTORY — DX: Other nonspecific abnormal finding of lung field: R91.8

## 2022-11-30 NOTE — Telephone Encounter (Signed)
-----  Message from Liliane Shi, [place] sent at 11/30/2022  9:13 AM EST ----- Results sent to Jason Giles via [place]. See MyChart comment.  I will send a copy to his PCP as an FYI PLAN: -Arrange follow-up gated chest/aorta CTA in 1 year (diagnosis: Lung nodule, ascending thoracic aortic aneurysm)   Jason Giles  Your CT demonstrates stable size of your thoracic aortic aneurysm at 4.1 cm.  There are 2 small nodules in the right midlung which appear to be benign.  We will repeat another CT in 1 year to keep an eye on your aneurysm and the 2 lung nodules. Richardson Dopp, PA-C    11/30/2022 9:08 AM

## 2022-12-02 DIAGNOSIS — E11319 Type 2 diabetes mellitus with unspecified diabetic retinopathy without macular edema: Secondary | ICD-10-CM | POA: Diagnosis not present

## 2022-12-02 DIAGNOSIS — Z794 Long term (current) use of insulin: Secondary | ICD-10-CM | POA: Diagnosis not present

## 2022-12-02 DIAGNOSIS — E119 Type 2 diabetes mellitus without complications: Secondary | ICD-10-CM | POA: Diagnosis not present

## 2022-12-02 DIAGNOSIS — E1165 Type 2 diabetes mellitus with hyperglycemia: Secondary | ICD-10-CM | POA: Diagnosis not present

## 2022-12-05 ENCOUNTER — Encounter: Payer: Self-pay | Admitting: Cardiovascular Disease

## 2022-12-14 DIAGNOSIS — E119 Type 2 diabetes mellitus without complications: Secondary | ICD-10-CM | POA: Diagnosis not present

## 2022-12-14 DIAGNOSIS — Z794 Long term (current) use of insulin: Secondary | ICD-10-CM | POA: Diagnosis not present

## 2022-12-14 DIAGNOSIS — Z1212 Encounter for screening for malignant neoplasm of rectum: Secondary | ICD-10-CM | POA: Diagnosis not present

## 2022-12-29 DIAGNOSIS — E113312 Type 2 diabetes mellitus with moderate nonproliferative diabetic retinopathy with macular edema, left eye: Secondary | ICD-10-CM | POA: Diagnosis not present

## 2023-01-02 DIAGNOSIS — K080 Exfoliation of teeth due to systemic causes: Secondary | ICD-10-CM | POA: Diagnosis not present

## 2023-02-21 ENCOUNTER — Encounter: Payer: Self-pay | Admitting: Cardiovascular Disease

## 2023-02-21 NOTE — Progress Notes (Unsigned)
Jason FallenJohn G Giles Date of Birth  08-02-1946 [facility] [address]     [address] [place], [place]  [address] [phone]  Fax  [phone]  Problem List 1. CAD - stenting 2011 ( Taxus DES in May , 2012)  CABG 3/23 2. Diabetes Mellitus  3. Essential Hypertension    Previous Notes:   Pt is doing well.  Has retired since last visit. No chest pain .  He denies any angina or dyspnea.  Dec. 8.2015:  Jason RuizJohn is a 77 yo who I follow for CAD, HTN, hyperlipidemia.  He went to the old office today. No having any problems. Is scheduled to have a colonoscopy and cataract surgery.     Oct. 4, 2016:  Jason RuizJohn is doing well.   No CP or dyspnea.   Has been having some left arm discomfort.   Noticed it when he got up from bed to use the bathroom.   No pain .   Possibly overworked it the day before .  Had done some heavy lifting the day of this discomfort. Has felt fine since.  Has been doing this normal activities since   Oct. 31, 2017:   Jason RuizJohn is seen back for follow up of his coronary artery disease.  He has a history of essential hypertension and diabetes.  We discussed Jason Giles.   His brother was the tennis coach up in [place] and he knows [place] .   No CP or dyspnea. Kayren EavesGong to the dermatology - Basal cell CA on his nose Gets eye injections every 4 weeks  ( Macular degeneration )   Dec. 6, 2018:  Jason RuizJohn is seen today for a follow-up visit.  He has a history of coronary artery disease-status post stenting  Still having issues with his eyes  No CP , no dyspnea.    Dec. 9, 2019:  Doing well .    Hx of PCI  - no angina  Needs to work on [employer] Wt. Today is 234 lbs.   Labs from Tisovec's office  Chol = 101 HDL = 31 LDL = 44 Trigs = [id number].   Sept. 15, 2020  Jason RuizJohn is seen today for further evaluation of his CAD and HTN  He was in the hospital following an episode of syncope in August.  Clinically I thought that his symptoms were consistent with  orthostatic hypotension.  He had not eaten or had much to drink earlier that day.  We have encouraged him to drink more fluids. We have ordered a cardiac event monitor.  He has still had some lightheadedness.  Gets dizzy when he gets up in the am .   February 02, 2020: Jason RuizJohn is seen today for follow-up of his coronary artery disease and hypertension. He had a presyncopal episode.  It is not clear whether this was due to volume depletion or a diabetic issue.  We stopped his metoprolol and he has felt better. Exercising some ,  Walks 2 - 3 times a week .  No CP  Still eating salt  Trying out a new insulin pump  Labs from his primary medical doctor reveal a total cholesterol of 110.  His HDL is 36.  LDL is 54.  Triglyceride levels 101.  Hemoglobin A1c is 7.4.  Hemoglobin is 17.5.  February 04, 2021: Jason RuizJohn is seen today for follow up of his CAD A month ago , he fell off a brick step,  Was thought to have a viral infection .  No CP , no dyspnea  Labs from his primary medical doctor in November, 2021 shows a total cholesterol of 94.  The HDL is 37.  LDL is 34.  Triglyceride levels 115.   Dec. 2, 2022 Jason Giles is seen for follow up of his CAD  Seen with wife, Aram Beecham  Occurred 5 days ago Uneasy feeling in his chest when  he went to bed Not exertional , Lasted for several hours  Did not feel like indigestion  Has been outside working in the yard this week without any chest pain since that time  Has had more fatigue for the past several months .  Has not been exercisng all that regularly for the past year or so    Jan. 16, 2023: Jason Giles is seen today for follow up of some episodes of chest pain .  Coronary artery CT angio showed a CAC score of 4147. LM - 50% mid-distal stenosis LAD - severe calcified stenosis LCx-  severe mid LCx disease  RCA - moderate prox - mid disease  Has not had any further episodes of CP since the original CP several months ago .   He has a history of coronary artery  disease.  In 2006 we placed a stent in the left circumflex artery ( 2.75 x 12 mm quantum Maverick) .  He had moderate irregularities elsewhere which were treated medically.  Has 2 tine pulmonary nodules  He is here to schedule cath   No regular exercise. Does yard work on most days  so he's is very active.    Walks 3-4 days a week . A mile at a time  Has lost some weight,  12 lbs  Taking a lower dose of insulin with good glucose levels   Is on amiodarone for post op PAF  Will D/C the amio in May  Cont metoprolol   February 22, 2023 Jason Giles is seen for follow up of his CAD  Not as much energy  He had some palpitations  We wearing a monitor  His insulsin pump had failed  Wife thought that the Aflutter was related to his pump failure  He was started on Eliquis at the time  Exercising 4 times a week , Walks and cardio machines.   Current Outpatient Medications on File Prior to Visit  Medication Sig Dispense Refill   acetaminophen (TYLENOL) 500 MG tablet Take 500 mg by mouth every 6 (six) hours as needed for moderate pain.     apixaban (ELIQUIS) 5 MG TABS tablet Take 1 tablet (5 mg total) by mouth 2 (two) times daily. 180 tablet 3   aspirin 81 MG tablet Take 81 mg by mouth daily.     b complex vitamins capsule Take 1 capsule by mouth daily.     Docusate Sodium (COLACE PO) Take 1 capsule by mouth daily.     fenofibrate micronized (LOFIBRA) 134 MG capsule Take 134 mg by mouth daily before breakfast.      insulin aspart (NOVOLOG) 100 UNIT/ML injection Inject 60 Units into the skin daily.     Insulin Human (INSULIN PUMP) SOLN Inject 60 each into the skin continuous. Using Novolog     JARDIANCE 25 MG TABS tablet Take 25 mg by mouth daily.   3   metFORMIN (GLUCOPHAGE) 850 MG tablet Take 850 mg by mouth 2 (two) times daily with a meal.     multivitamin (ONE-A-DAY MEN'S) TABS tablet Take 1 tablet by mouth daily.     nitroGLYCERIN (NITROSTAT) 0.4  MG SL tablet Place 1 tablet (0.4 mg total) under  the tongue every 5 (five) minutes as needed for chest pain. 25 tablet 1   polyethylene glycol (MIRALAX / GLYCOLAX) 17 g packet Take 17 g by mouth daily as needed for moderate constipation.     Polyvinyl Alcohol-Povidone (REFRESH OP) Place 1 drop into both eyes daily as needed (dry eyes).     rosuvastatin (CRESTOR) 40 MG tablet Take 40 mg by mouth daily.     tobramycin (TOBREX) 0.3 % ophthalmic solution Place 1 drop into both eyes See admin instructions. Instill 1 drop into both eyes daily for the day before, the day of, and the day after eye injections     No current facility-administered medications on file prior to visit.    Allergies  Allergen Reactions   Oxycodone Other (See Comments)    Hallucinations      Past Medical History:  Diagnosis Date   Arthritis    Colon polyp    adenomatous   Coronary artery disease    post PTCA and stenting   CORONARY ATHEROSCLEROSIS NATIVE CORONARY ARTERY 12/21/2009   Diabetes mellitus    Diverticulosis    History of kidney stones    Hyperlipidemia    Hypertension    Lung nodules 11/30/2022   CTA 11/29/2022: Ascending thoracic aortic aneurysm 4.1 cm; 2 small right middle lobe nodules most compatible with resolving inflammation/infection.  Repeat 1 year    Past Surgical History:  Procedure Laterality Date   CORONARY ANGIOPLASTY WITH STENT PLACEMENT     left circumflex artery   CORONARY ARTERY BYPASS GRAFT N/A 12/20/2021   Procedure: CORONARY ARTERY BYPASS GRAFTING (CABG) TIMES THREE, USING LEFT INTERNAL MAMMARY ARTERY AND ENDOSCOPICALLY HARVESTED RIGHT GREATER SAPHENOUS VEIN;  Surgeon: Corliss Skains, MD;  Location: [facility] OR;  Service: Open Heart Surgery;  Laterality: N/A;   ENDOVEIN HARVEST OF GREATER SAPHENOUS VEIN Right 12/20/2021   Procedure: ENDOVEIN HARVEST OF GREATER SAPHENOUS VEIN;  Surgeon: Corliss Skains, MD;  Location: [facility] OR;  Service: Open Heart Surgery;  Laterality: Right;   LEFT HEART CATH AND CORONARY ANGIOGRAPHY N/A  12/02/2021   Procedure: LEFT HEART CATH AND CORONARY ANGIOGRAPHY;  Surgeon: Orbie Pyo, MD;  Location: [facility] INVASIVE CV LAB;  Service: Cardiovascular;  Laterality: N/A;   TEE WITHOUT CARDIOVERSION N/A 12/20/2021   Procedure: TRANSESOPHAGEAL ECHOCARDIOGRAM (TEE);  Surgeon: Corliss Skains, MD;  Location: [facility];  Service: Open Heart Surgery;  Laterality: N/A;    Social History   Tobacco Use  Smoking Status Former   Types: Cigarettes   Quit date: 11/13/1985   Years since quitting: 37.3  Smokeless Tobacco Never    Social History   Substance and Sexual Activity  Alcohol Use Yes   Alcohol/week: 0.0 standard drinks of alcohol   Comment: Occassionally    Family History  Problem Relation Age of Onset   Cancer Father        Brain Tumor   Colon cancer Mother    Colon polyps Neg Hx    Kidney disease Neg Hx    Diabetes Neg Hx    Esophageal cancer Neg Hx     Reviw of Systems:  Reviewed in the HPI.  All other systems are negative.  Physical Exam: Blood pressure 134/72, pulse 91, height 5' 11.5" (1.816 m), weight 220 lb 9.6 oz (100.1 kg), SpO2 95 %.       GEN:  Well nourished, well developed in no acute distress HEENT: Normal NECK: No  JVD; No carotid bruits LYMPHATICS: No lymphadenopathy CARDIAC: RRR , no murmurs, rubs, gallops RESPIRATORY:  Clear to auscultation without rales, wheezing or rhonchi  ABDOMEN: Soft, non-tender, non-distended MUSCULOSKELETAL:  No edema; No deformity  SKIN: Warm and dry NEUROLOGIC:  Alert and oriented x 3   ECG:    February 22, 2023: Sinus rhythm.  First-degree AV block.  Heart rate 91.   Assessment / Plan:   1. CAD - stenting 2006 ( Taxus DES in May , 2012) -     He now has LM and prox  LM disease  Had CABG in Feb. , 2023  Is now on eliquis, will DC ASA    2. Diabetes Mellitus  - plans   3.  Orthostatic hypotension:      3. Essential Hypertension  -    BP is well controlled.    4.  Hyperlipidemia :    check lipids today       Kristeen Miss, MD  02/22/2023 9:29 AM    [facility] [address] [place],  [address] [place], [place]  [address] Pager [phone] Phone: [phone]; Fax: [phone]

## 2023-02-22 ENCOUNTER — Encounter: Payer: Self-pay | Admitting: Cardiovascular Disease

## 2023-02-22 ENCOUNTER — Ambulatory Visit: Payer: Medicare Other | Attending: Cardiovascular Disease | Admitting: Cardiovascular Disease

## 2023-02-22 VITALS — BP 134/72 | HR 91 | Ht 71.5 in | Wt 220.6 lb

## 2023-02-22 DIAGNOSIS — I480 Paroxysmal atrial fibrillation: Secondary | ICD-10-CM

## 2023-02-22 DIAGNOSIS — I2510 Atherosclerotic heart disease of native coronary artery without angina pectoris: Secondary | ICD-10-CM

## 2023-02-22 NOTE — Patient Instructions (Signed)
Medication Instructions:  STOP Aspirin 81mg   *If you need a refill on your cardiac medications before your next appointment, please call your pharmacy*   Lab Work: BMET, CBC, and Lipids TODAY If you have labs (blood work) drawn today and your tests are completely normal, you will receive your results only by: MyChart Message (if you have MyChart) OR A paper copy in the mail If you have any lab test that is abnormal or we need to change your treatment, we will call you to review the results.   Follow-Up: At [facility], you and your health needs are our priority.  As part of our continuing mission to provide you with exceptional heart care, we have created designated Provider Care Teams.  These Care Teams include your primary Cardiologist (physician) and Advanced Practice Providers (APPs -  Physician Assistants and Nurse Practitioners) who all work together to provide you with the care you need, when you need it.  We recommend signing up for the patient portal called "MyChart".  Sign up information is provided on this After Visit Summary.  MyChart is used to connect with patients for Virtual Visits (Telemedicine).  Patients are able to view lab/test results, encounter notes, upcoming appointments, etc.  Non-urgent messages can be sent to your provider as well.   To learn more about what you can do with MyChart, go to [email or web address].    Your next appointment:   6 month(s)  Provider:   Tereso Newcomer [facility]

## 2023-02-23 DIAGNOSIS — E113413 Type 2 diabetes mellitus with severe nonproliferative diabetic retinopathy with macular edema, bilateral: Secondary | ICD-10-CM | POA: Diagnosis not present

## 2023-02-23 DIAGNOSIS — H43811 Vitreous degeneration, right eye: Secondary | ICD-10-CM | POA: Diagnosis not present

## 2023-02-23 DIAGNOSIS — H35371 Puckering of macula, right eye: Secondary | ICD-10-CM | POA: Diagnosis not present

## 2023-02-23 DIAGNOSIS — H43821 Vitreomacular adhesion, right eye: Secondary | ICD-10-CM | POA: Diagnosis not present

## 2023-02-23 LAB — BASIC METABOLIC PANEL
BUN/Creatinine Ratio: 14 (ref 10–24)
BUN: 21 mg/dL (ref 8–27)
CO2: 24 mmol/L (ref 20–29)
Calcium: 9.9 mg/dL (ref 8.6–10.2)
Chloride: 105 mmol/L (ref 96–106)
Creatinine, Ser: 1.47 mg/dL — ABNORMAL HIGH (ref 0.76–1.27)
Glucose: 176 mg/dL — ABNORMAL HIGH (ref 70–99)
Potassium: 5.3 mmol/L — ABNORMAL HIGH (ref 3.5–5.2)
Sodium: 142 mmol/L (ref 134–144)
eGFR: 49 mL/min/{1.73_m2} — ABNORMAL LOW (ref >59)

## 2023-02-23 LAB — LIPID PANEL
Chol/HDL Ratio: 2.2 ratio (ref 0.0–5.0)
Cholesterol, Total: 90 mg/dL — ABNORMAL LOW (ref 100–199)
HDL: 41 mg/dL (ref >39)
LDL Chol Calc (NIH): 30 mg/dL (ref 0–99)
Triglycerides: 103 mg/dL (ref 0–149)
VLDL Cholesterol Cal: 19 mg/dL (ref 5–40)

## 2023-02-23 LAB — CBC
Hematocrit: 48.6 % (ref 37.5–51.0)
Hemoglobin: 16.4 g/dL (ref 13.0–17.7)
MCH: 31.1 pg (ref 26.6–33.0)
MCHC: 33.7 g/dL (ref 31.5–35.7)
MCV: 92 fL (ref 79–97)
Platelets: 212 10*3/uL (ref 150–450)
RBC: 5.28 x10E6/uL (ref 4.14–5.80)
RDW: 13 % (ref 11.6–15.4)
WBC: 8.1 10*3/uL (ref 3.4–10.8)

## 2023-03-01 DIAGNOSIS — I2510 Atherosclerotic heart disease of native coronary artery without angina pectoris: Secondary | ICD-10-CM | POA: Diagnosis not present

## 2023-03-01 DIAGNOSIS — I1310 Hypertensive heart and chronic kidney disease without heart failure, with stage 1 through stage 4 chronic kidney disease, or unspecified chronic kidney disease: Secondary | ICD-10-CM | POA: Diagnosis not present

## 2023-03-01 DIAGNOSIS — N1831 Chronic kidney disease, stage 3a: Secondary | ICD-10-CM | POA: Diagnosis not present

## 2023-03-01 DIAGNOSIS — E1129 Type 2 diabetes mellitus with other diabetic kidney complication: Secondary | ICD-10-CM | POA: Diagnosis not present

## 2023-03-02 DIAGNOSIS — E11319 Type 2 diabetes mellitus with unspecified diabetic retinopathy without macular edema: Secondary | ICD-10-CM | POA: Diagnosis not present

## 2023-03-02 DIAGNOSIS — E1165 Type 2 diabetes mellitus with hyperglycemia: Secondary | ICD-10-CM | POA: Diagnosis not present

## 2023-03-02 DIAGNOSIS — E119 Type 2 diabetes mellitus without complications: Secondary | ICD-10-CM | POA: Diagnosis not present

## 2023-03-02 DIAGNOSIS — Z794 Long term (current) use of insulin: Secondary | ICD-10-CM | POA: Diagnosis not present

## 2023-03-08 ENCOUNTER — Other Ambulatory Visit: Payer: Self-pay | Admitting: *Deleted

## 2023-03-08 MED ORDER — APIXABAN 5 MG PO TABS: 5.0000 mg | ORAL_TABLET | Freq: Two times a day (BID) | ORAL | 1 refills | Status: DC

## 2023-03-08 NOTE — Telephone Encounter (Signed)
Prescription refill request for Eliquis received. Indication: afib  Last office visit: Nahser 02/22/2023 Scr: 1.47, 02/22/2023 Age: 77  Weight: 100.1 kg   Refill sent.

## 2023-03-09 NOTE — Telephone Encounter (Signed)
Refill request received for Jardiance, but patient takes for his diabetes, not cardiac functioning. Cardiologist did not mention at all in his last OV note and says to continue diabetes care as managed by PCP. Refill should come from them.

## 2023-03-12 DIAGNOSIS — K080 Exfoliation of teeth due to systemic causes: Secondary | ICD-10-CM | POA: Diagnosis not present

## 2023-03-14 ENCOUNTER — Telehealth: Payer: Self-pay | Admitting: Cardiovascular Disease

## 2023-03-14 DIAGNOSIS — I480 Paroxysmal atrial fibrillation: Secondary | ICD-10-CM

## 2023-03-14 MED ORDER — APIXABAN 5 MG PO TABS: 5.0000 mg | ORAL_TABLET | Freq: Two times a day (BID) | ORAL | 1 refills | Status: DC

## 2023-03-14 NOTE — Telephone Encounter (Signed)
*  STAT* If patient is at the pharmacy, call can be transferred to refill team.   1. Which medications need to be refilled? (please list name of each medication and dose if known)   apixaban (ELIQUIS) 5 MG TABS tablet    2. Which pharmacy/location (including street and city if local pharmacy) is medication to be sent to? [facility] - [place], [place] - [address]   3. Do they need a 30 day or 90 day supply? 90 day  This medication should've originally been sent to above pharmacy.

## 2023-03-14 NOTE — Telephone Encounter (Signed)
Prescription refill request for Eliquis received. Indication: CAD Last office visit: 02/22/23 (Nahser)  Scr: 1.47 (02/22/23)  Age: 77 Weight: 100.1kg  Appropriate dose. Refill sent.

## 2023-04-01 DIAGNOSIS — E119 Type 2 diabetes mellitus without complications: Secondary | ICD-10-CM | POA: Diagnosis not present

## 2023-04-01 DIAGNOSIS — Z794 Long term (current) use of insulin: Secondary | ICD-10-CM | POA: Diagnosis not present

## 2023-04-01 DIAGNOSIS — E11319 Type 2 diabetes mellitus with unspecified diabetic retinopathy without macular edema: Secondary | ICD-10-CM | POA: Diagnosis not present

## 2023-04-01 DIAGNOSIS — E1165 Type 2 diabetes mellitus with hyperglycemia: Secondary | ICD-10-CM | POA: Diagnosis not present

## 2023-04-12 ENCOUNTER — Other Ambulatory Visit: Payer: Self-pay

## 2023-04-12 DIAGNOSIS — I480 Paroxysmal atrial fibrillation: Secondary | ICD-10-CM

## 2023-04-12 MED ORDER — APIXABAN 5 MG PO TABS: 5.0000 mg | ORAL_TABLET | Freq: Two times a day (BID) | ORAL | 1 refills | Status: DC

## 2023-04-12 NOTE — Telephone Encounter (Signed)
Prescription refill request for Eliquis received. Indication: Afib  Last office visit: 02/22/23 (Nahser)  Scr: 1.47 (02/22/23)  Age: 77 Weight: 100.1kg  Appropriate dose. Refill sent.

## 2023-04-23 ENCOUNTER — Other Ambulatory Visit: Payer: Self-pay | Admitting: *Deleted

## 2023-04-23 DIAGNOSIS — I480 Paroxysmal atrial fibrillation: Secondary | ICD-10-CM

## 2023-04-23 MED ORDER — APIXABAN 5 MG PO TABS: 5.0000 mg | ORAL_TABLET | Freq: Two times a day (BID) | ORAL | 1 refills | Status: DC

## 2023-04-23 NOTE — Telephone Encounter (Signed)
Eliquis 5mg  refill request received. Patient is 77 years old, weight-100.1kg, Crea-1.47 on 02/22/23, Diagnosis-Afib, and last seen by Dr. Elease Hashimoto on 02/22/23. Dose is appropriate based on dosing criteria. Will send in refill to requested pharmacy.

## 2023-04-25 DIAGNOSIS — E113412 Type 2 diabetes mellitus with severe nonproliferative diabetic retinopathy with macular edema, left eye: Secondary | ICD-10-CM | POA: Diagnosis not present

## 2023-05-01 DIAGNOSIS — E1165 Type 2 diabetes mellitus with hyperglycemia: Secondary | ICD-10-CM | POA: Diagnosis not present

## 2023-05-01 DIAGNOSIS — E11319 Type 2 diabetes mellitus with unspecified diabetic retinopathy without macular edema: Secondary | ICD-10-CM | POA: Diagnosis not present

## 2023-05-01 DIAGNOSIS — Z794 Long term (current) use of insulin: Secondary | ICD-10-CM | POA: Diagnosis not present

## 2023-05-01 DIAGNOSIS — E119 Type 2 diabetes mellitus without complications: Secondary | ICD-10-CM | POA: Diagnosis not present

## 2023-05-10 IMAGING — DX DG CHEST 2V
2 series · 2 of 2 positions shown · non-contrast
Comparison: 06/13/2019.

CLINICAL DATA: Preoperative for heart surgery.

EXAM:
CHEST - 2 VIEW

[w chest pa]
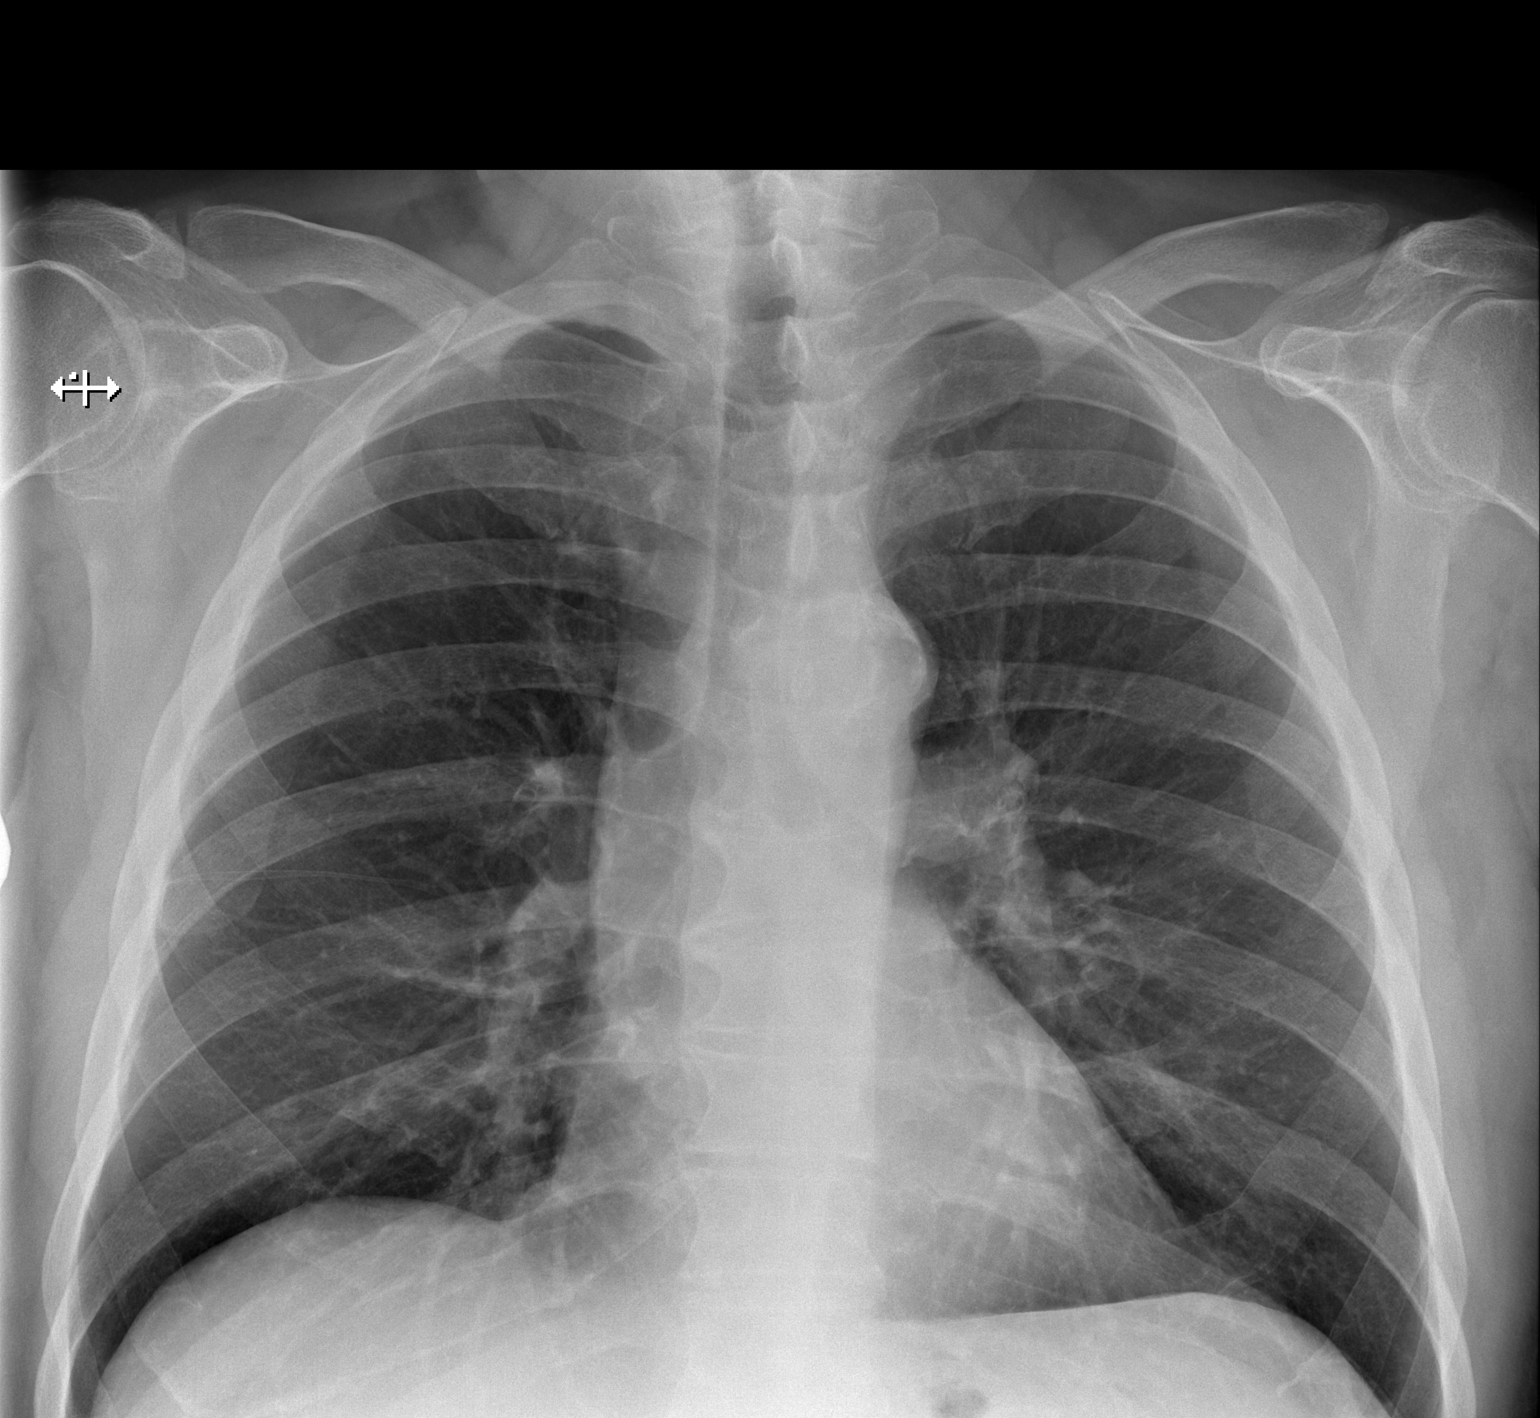

[w chest lat]
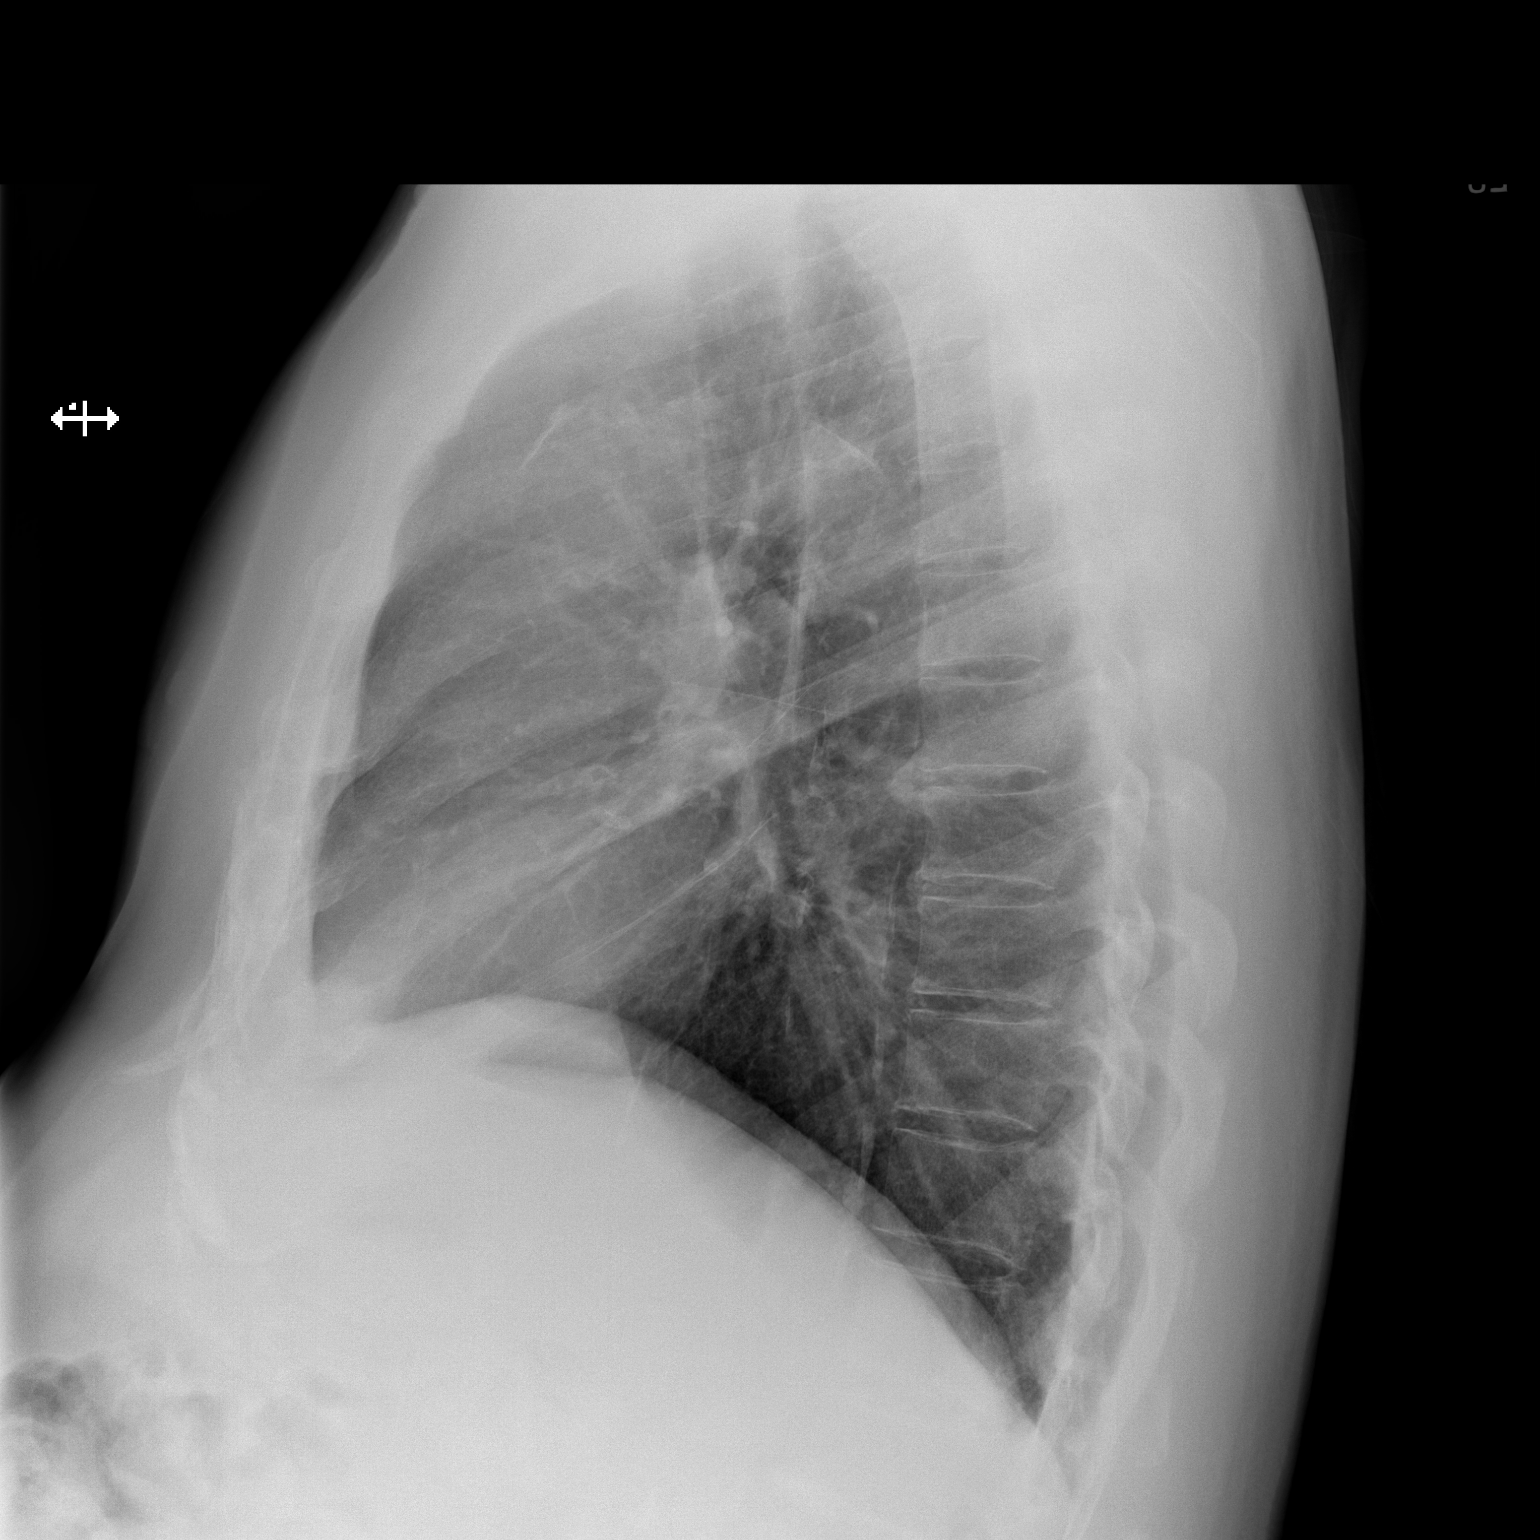

[2 of 2 positions shown; findings below may reference images not displayed]

FINDINGS: The heart size and mediastinal contours are within normal limits.
Atherosclerotic calcification of the aorta is noted. No
consolidation, effusion, or pneumothorax. Mild degenerative changes
in the thoracic spine.
IMPRESSION: No active cardiopulmonary disease.

## 2023-05-15 IMAGING — DX DG CHEST 1V PORT
1 series · 1 of 1 positions shown · non-contrast
Comparison: December 20, 2021.

CLINICAL DATA: Status post open heart surgery, chest tube

EXAM:
PORTABLE CHEST 1 VIEW

[chest]
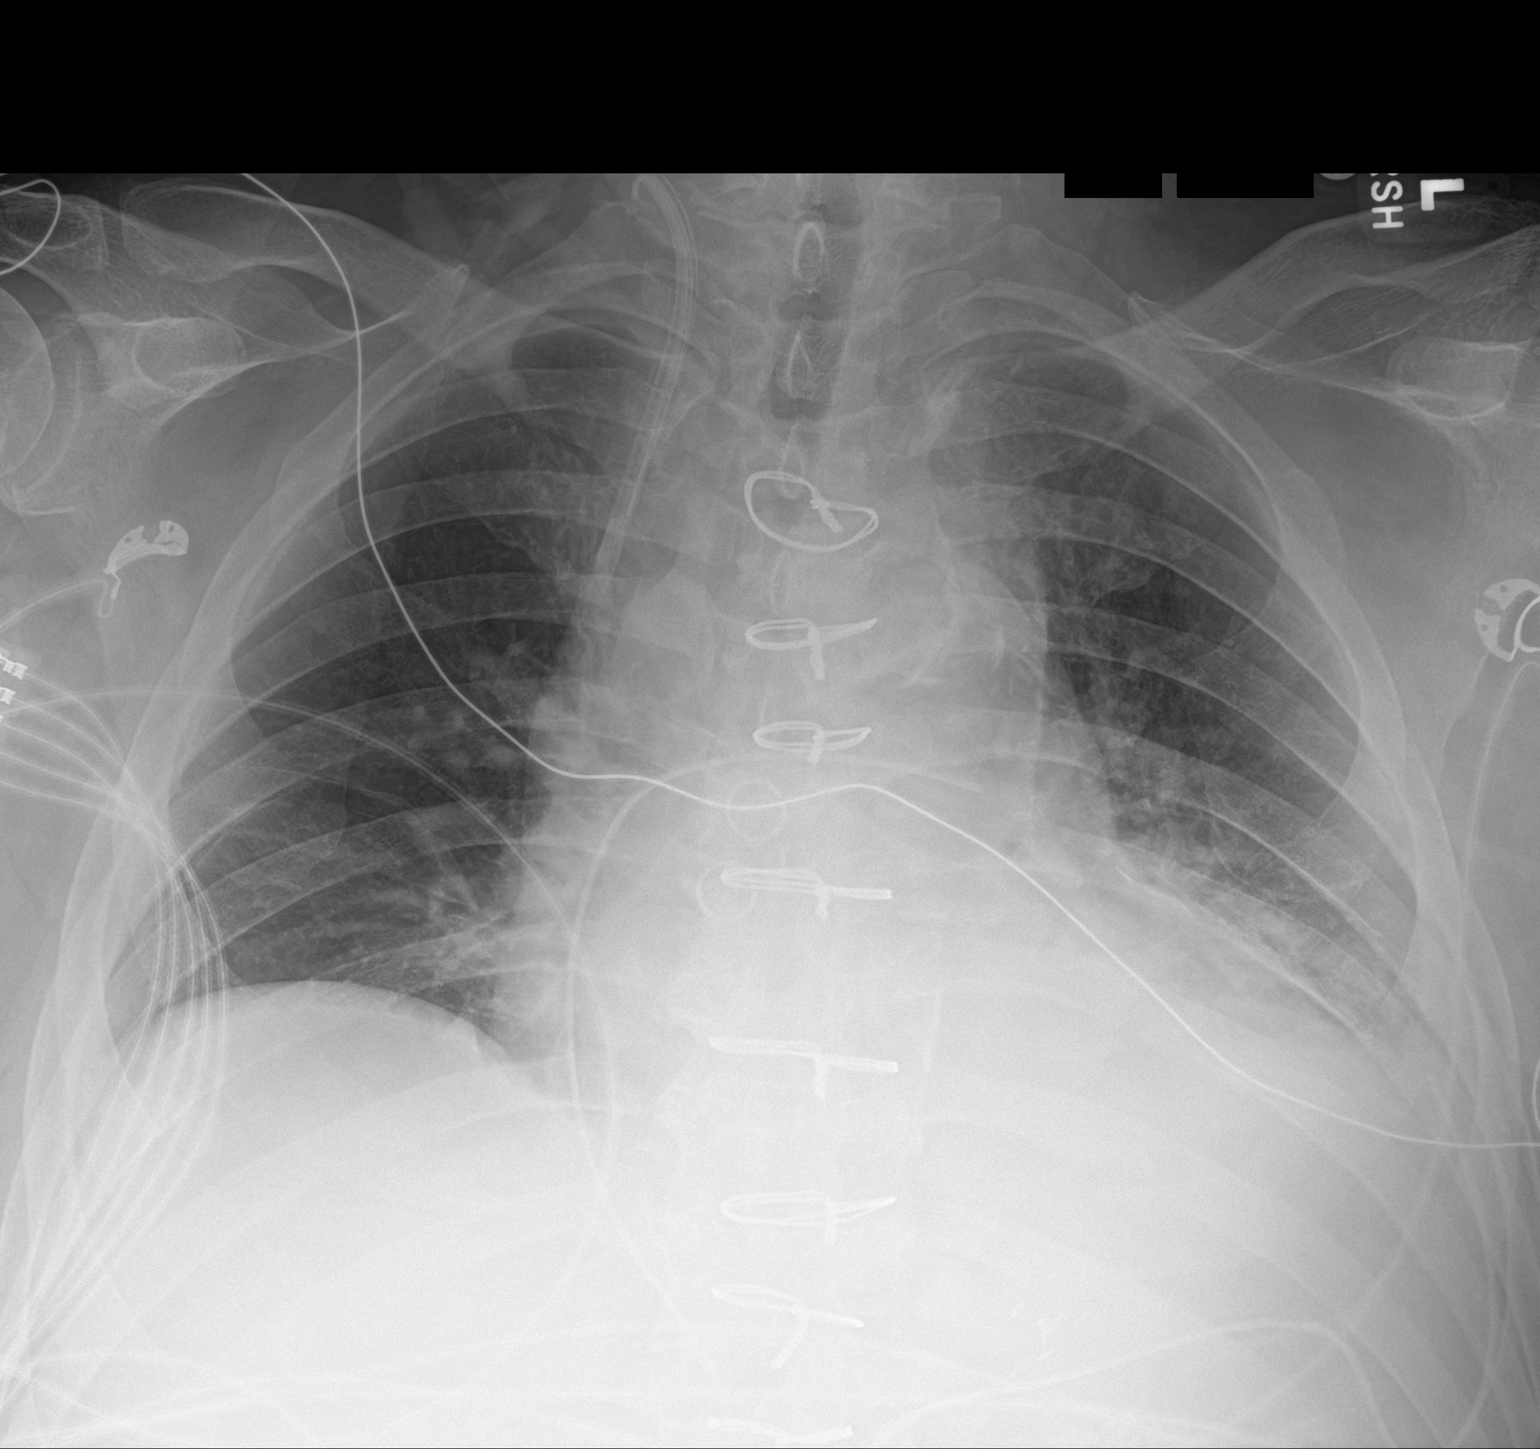

[1 of 1 positions shown; findings below may reference images not displayed]

FINDINGS: Patient is rotated to the right with lordotic positioning. Right IJ
CVC with tip overlying the superior vena cava. Mediastinal drain.
Prior median sternotomy and CABG. Similar cardiomediastinal
enlargement. New opacities in the left lung base with possible
small left pleural effusion. Right lung is clear. No visible
pneumothorax.
IMPRESSION: New opacities in the left lung base with possible small left pleural
effusion, may represent atelectasis and/or pneumonia.

Interval removal of the endotracheal tube and enteric tube.

## 2023-05-21 ENCOUNTER — Other Ambulatory Visit: Payer: Self-pay

## 2023-05-21 DIAGNOSIS — I480 Paroxysmal atrial fibrillation: Secondary | ICD-10-CM

## 2023-05-21 MED ORDER — APIXABAN 5 MG PO TABS: 5.0000 mg | ORAL_TABLET | Freq: Two times a day (BID) | ORAL | 1 refills | Status: DC

## 2023-05-21 MED ORDER — JARDIANCE 25 MG PO TABS
25.0000 mg | ORAL_TABLET | Freq: Every day | ORAL | 2 refills | Status: AC
Start: 2023-05-21 — End: ?

## 2023-05-21 NOTE — Telephone Encounter (Signed)
Pt's medications were resent to a different pharmacy as requested. Confirmation received.  

## 2023-06-04 DIAGNOSIS — E1129 Type 2 diabetes mellitus with other diabetic kidney complication: Secondary | ICD-10-CM | POA: Diagnosis not present

## 2023-06-19 DIAGNOSIS — K080 Exfoliation of teeth due to systemic causes: Secondary | ICD-10-CM | POA: Diagnosis not present

## 2023-06-20 DIAGNOSIS — H43811 Vitreous degeneration, right eye: Secondary | ICD-10-CM | POA: Diagnosis not present

## 2023-06-20 DIAGNOSIS — H43821 Vitreomacular adhesion, right eye: Secondary | ICD-10-CM | POA: Diagnosis not present

## 2023-06-20 DIAGNOSIS — E113413 Type 2 diabetes mellitus with severe nonproliferative diabetic retinopathy with macular edema, bilateral: Secondary | ICD-10-CM | POA: Diagnosis not present

## 2023-06-20 DIAGNOSIS — H35371 Puckering of macula, right eye: Secondary | ICD-10-CM | POA: Diagnosis not present

## 2023-07-04 ENCOUNTER — Other Ambulatory Visit: Payer: Self-pay

## 2023-08-06 DIAGNOSIS — E11319 Type 2 diabetes mellitus with unspecified diabetic retinopathy without macular edema: Secondary | ICD-10-CM | POA: Diagnosis not present

## 2023-08-06 DIAGNOSIS — E119 Type 2 diabetes mellitus without complications: Secondary | ICD-10-CM | POA: Diagnosis not present

## 2023-08-06 DIAGNOSIS — Z794 Long term (current) use of insulin: Secondary | ICD-10-CM | POA: Diagnosis not present

## 2023-08-06 DIAGNOSIS — E1165 Type 2 diabetes mellitus with hyperglycemia: Secondary | ICD-10-CM | POA: Diagnosis not present

## 2023-08-14 DIAGNOSIS — E119 Type 2 diabetes mellitus without complications: Secondary | ICD-10-CM | POA: Diagnosis not present

## 2023-08-14 DIAGNOSIS — Z794 Long term (current) use of insulin: Secondary | ICD-10-CM | POA: Diagnosis not present

## 2023-08-15 DIAGNOSIS — E113412 Type 2 diabetes mellitus with severe nonproliferative diabetic retinopathy with macular edema, left eye: Secondary | ICD-10-CM | POA: Diagnosis not present

## 2023-08-21 DIAGNOSIS — E119 Type 2 diabetes mellitus without complications: Secondary | ICD-10-CM | POA: Diagnosis not present

## 2023-08-28 DIAGNOSIS — E119 Type 2 diabetes mellitus without complications: Secondary | ICD-10-CM | POA: Diagnosis not present

## 2023-09-04 DIAGNOSIS — E119 Type 2 diabetes mellitus without complications: Secondary | ICD-10-CM | POA: Diagnosis not present

## 2023-09-05 DIAGNOSIS — E119 Type 2 diabetes mellitus without complications: Secondary | ICD-10-CM | POA: Diagnosis not present

## 2023-09-05 DIAGNOSIS — Z794 Long term (current) use of insulin: Secondary | ICD-10-CM | POA: Diagnosis not present

## 2023-09-05 DIAGNOSIS — E11319 Type 2 diabetes mellitus with unspecified diabetic retinopathy without macular edema: Secondary | ICD-10-CM | POA: Diagnosis not present

## 2023-09-05 DIAGNOSIS — E1165 Type 2 diabetes mellitus with hyperglycemia: Secondary | ICD-10-CM | POA: Diagnosis not present

## 2023-09-11 ENCOUNTER — Encounter: Payer: Self-pay | Admitting: Physician Assistant

## 2023-09-11 DIAGNOSIS — E119 Type 2 diabetes mellitus without complications: Secondary | ICD-10-CM | POA: Diagnosis not present

## 2023-09-11 DIAGNOSIS — I4892 Unspecified atrial flutter: Secondary | ICD-10-CM

## 2023-09-11 HISTORY — DX: Unspecified atrial flutter: I48.92

## 2023-09-11 NOTE — Progress Notes (Unsigned)
Cardiology Office Note:    Date:  09/12/2023  ID:  Jason Giles, DOB Sep 17, 1946, MRN [id number] PCP: Jason Garbe, MD  [name] Providers Cardiologist:  Jason Miss, MD       Patient Profile:      Coronary artery disease  S/p 2.5 x 16 mm Taxus DES to LCx in 06/2005 S/p CABG in 12/2021 (L-LAD, S-OM, S-PLB) C/b post op AFib Echocardiogram 12/08/21: EF 60-65, no RWMA, mod to severe basal septal LVH, normal RVSF, mild MR, dilated ascending aorta (37 mm) Paroxysmal atrial flutter   Monitor 10/2022: AFlutter, NSVT Diabetes mellitus  Chronic kidney disease stage III Hypertension  Hyperlipidemia  Hx of syncope  Echocardiogram 07/2019: EF > 65 Transient HB on event monitor >> referred to EP beta-blocker DCd >> PPM indicated if further syncope off beta-blocker  Orthostatic hypotension  Ascending thoracic aortic aneurysm  CCTA 11/2021: 4.3 cm  CTA 11/29/22: TAA 4.1 cm  Carotid artery disease [place] 12/2021: Bilat ICA 1-39  Pulmonary nodules CT 11/29/22: 2 small RML nodules >> repeat CT 1 year          History of Present Illness:  Discussed the use of AI scribe software for clinical note transcription with the patient, who gave verbal consent to proceed.  Jason Giles is a 77 y.o. male who returns for follow up of CAD, AFlutter, ascending aortic aneurysm. He was last seen by Dr. Elease Giles 02/2023. He reports no new health issues since the last visit. The patient's spouse reported that the patient has a shuffling gait, which he attributes to vision issues and fear of falling. The patient denies any dizziness or tremors and has not had any recent falls. The patient also denies any chest pain, shortness of breath, or episodes of passing out. The patient's blood pressure is well-controlled without medication.     Review of Systems  Gastrointestinal:  Negative for hematochezia and melena.  Genitourinary:  Negative for hematuria.  See HPI     Studies Reviewed:       Labs  - Chart Review 02/22/23: K 5.3, SCr 1.47, TG 103, LDL 30, Hgb 16.4          Risk Assessment/Calculations:    CHA2DS2-VASc Score = 5   This indicates a 7.2% annual risk of stroke. The patient's score is based upon: CHF History: 0 HTN History: 1 Diabetes History: 1 Stroke History: 0 Vascular Disease History: 1 Age Score: 2 Gender Score: 0            Physical Exam:   VS:  BP 134/60   Pulse 90   Ht 5' 11.5" (1.816 m)   Wt 221 lb 9.6 oz (100.5 kg)   SpO2 97%   BMI 30.48 kg/m    Wt Readings from Last 3 Encounters:  09/12/23 221 lb 9.6 oz (100.5 kg)  02/22/23 220 lb 9.6 oz (100.1 kg)  10/25/22 224 lb (101.6 kg)    Constitutional:      Appearance: Healthy appearance. Not in distress.  Neck:     Vascular: JVD normal.  Pulmonary:     Breath sounds: Normal breath sounds. No wheezing. No rales.  Cardiovascular:     Normal rate. Regular rhythm.     Murmurs: There is no murmur.  Edema:    Peripheral edema absent.  Abdominal:     Palpations: Abdomen is soft.        Assessment and Plan:   Assessment & Plan Coronary artery disease involving native coronary  artery of native heart without angina pectoris Status post DES to LCX in 2006 and subsequent CABG in February 2023. No current anginal symptoms.  He is not on antiplatelet therapy due to Eliquis use. -Continue Crestor 40mg  daily. Aneurysm of ascending aorta without rupture (HCC) 4.1cm on CT in January 2024. -Follow-up CT due in January 2025. Paroxysmal atrial flutter (HCC) Regular rhythm on exam. Tolerating anticoagulation well. Appropriate Eliquis dose based on age, weight, and renal function. -Continue Eliquis 5mg  twice daily. -Obtain follow-up Basic Metabolic Panel (BMP) today. Stage 3a chronic kidney disease (HCC) Mildly elevated potassium in April. -Obtain follow-up BMP today. Essential hypertension Blood pressure controlled without medication. Mixed hyperlipidemia LDL optimal in April. -Continue Crestor  40mg  daily. Lung nodules Two small right middle lobe nodules on CT in January 2024. -Follow-up CT pending in January 2025.             Dispo:  Return in about 6 months (around 03/12/2024) for Routine Follow Up, w/ Dr. Elease Giles.  Signed, Jason Newcomer, PA-C

## 2023-09-12 ENCOUNTER — Ambulatory Visit: Payer: Medicare Other | Attending: Physician Assistant | Admitting: Physician Assistant

## 2023-09-12 ENCOUNTER — Encounter: Payer: Self-pay | Admitting: Physician Assistant

## 2023-09-12 VITALS — BP 134/60 | HR 90 | Ht 71.5 in | Wt 221.6 lb

## 2023-09-12 DIAGNOSIS — E782 Mixed hyperlipidemia: Secondary | ICD-10-CM

## 2023-09-12 DIAGNOSIS — I10 Essential (primary) hypertension: Secondary | ICD-10-CM

## 2023-09-12 DIAGNOSIS — I4892 Unspecified atrial flutter: Secondary | ICD-10-CM

## 2023-09-12 DIAGNOSIS — I7121 Aneurysm of the ascending aorta, without rupture: Secondary | ICD-10-CM | POA: Diagnosis not present

## 2023-09-12 DIAGNOSIS — I2510 Atherosclerotic heart disease of native coronary artery without angina pectoris: Secondary | ICD-10-CM | POA: Diagnosis not present

## 2023-09-12 DIAGNOSIS — N1831 Chronic kidney disease, stage 3a: Secondary | ICD-10-CM

## 2023-09-12 DIAGNOSIS — R918 Other nonspecific abnormal finding of lung field: Secondary | ICD-10-CM

## 2023-09-12 NOTE — Assessment & Plan Note (Signed)
Blood pressure controlled without medication.

## 2023-09-12 NOTE — Assessment & Plan Note (Signed)
LDL optimal in April. -Continue Crestor 40mg  daily.

## 2023-09-12 NOTE — Assessment & Plan Note (Signed)
Regular rhythm on exam. Tolerating anticoagulation well. Appropriate Eliquis dose based on age, weight, and renal function. -Continue Eliquis 5mg  twice daily. -Obtain follow-up Basic Metabolic Panel (BMP) today.

## 2023-09-12 NOTE — Assessment & Plan Note (Signed)
4.1cm on CT in January 2024. -Follow-up CT due in January 2025.

## 2023-09-12 NOTE — Assessment & Plan Note (Signed)
Two small right middle lobe nodules on CT in January 2024. -Follow-up CT pending in January 2025.

## 2023-09-12 NOTE — Assessment & Plan Note (Signed)
Mildly elevated potassium in April. -Obtain follow-up BMP today.

## 2023-09-12 NOTE — Patient Instructions (Signed)
Medication Instructions:  Your physician recommends that you continue on your current medications as directed. Please refer to the Current Medication list given to you today.  *If you need a refill on your cardiac medications before your next appointment, please call your pharmacy*   Lab Work: TODAY:  BMET  If you have labs (blood work) drawn today and your tests are completely normal, you will receive your results only by: MyChart Message (if you have MyChart) OR A paper copy in the mail If you have any lab test that is abnormal or we need to change your treatment, we will call you to review the results.   Testing/Procedures: None ordered   Follow-Up: At [facility], you and your health needs are our priority.  As part of our continuing mission to provide you with exceptional heart care, we have created designated Provider Care Teams.  These Care Teams include your primary Cardiologist (physician) and Advanced Practice Providers (APPs -  Physician Assistants and Nurse Practitioners) who all work together to provide you with the care you need, when you need it.  We recommend signing up for the patient portal called "MyChart".  Sign up information is provided on this After Visit Summary.  MyChart is used to connect with patients for Virtual Visits (Telemedicine).  Patients are able to view lab/test results, encounter notes, upcoming appointments, etc.  Non-urgent messages can be sent to your provider as well.   To learn more about what you can do with MyChart, go to [email or web address].    Your next appointment:   6 month(s)  Provider:   Kristeen Miss, MD     Other Instructions

## 2023-09-12 NOTE — Assessment & Plan Note (Signed)
Status post DES to LCX in 2006 and subsequent CABG in February 2023. No current anginal symptoms.  He is not on antiplatelet therapy due to Eliquis use. -Continue Crestor 40mg  daily.

## 2023-09-13 DIAGNOSIS — I1310 Hypertensive heart and chronic kidney disease without heart failure, with stage 1 through stage 4 chronic kidney disease, or unspecified chronic kidney disease: Secondary | ICD-10-CM | POA: Diagnosis not present

## 2023-09-13 DIAGNOSIS — E1129 Type 2 diabetes mellitus with other diabetic kidney complication: Secondary | ICD-10-CM | POA: Diagnosis not present

## 2023-09-13 LAB — BASIC METABOLIC PANEL
BUN/Creatinine Ratio: 13 (ref 10–24)
BUN: 17 mg/dL (ref 8–27)
CO2: 21 mmol/L (ref 20–29)
Calcium: 9.6 mg/dL (ref 8.6–10.2)
Chloride: 103 mmol/L (ref 96–106)
Creatinine, Ser: 1.26 mg/dL (ref 0.76–1.27)
Glucose: 245 mg/dL — ABNORMAL HIGH (ref 70–99)
Potassium: 4.5 mmol/L (ref 3.5–5.2)
Sodium: 139 mmol/L (ref 134–144)
eGFR: 58 mL/min/{1.73_m2} — ABNORMAL LOW (ref >59)

## 2023-09-18 DIAGNOSIS — E119 Type 2 diabetes mellitus without complications: Secondary | ICD-10-CM | POA: Diagnosis not present

## 2023-09-25 DIAGNOSIS — E119 Type 2 diabetes mellitus without complications: Secondary | ICD-10-CM | POA: Diagnosis not present

## 2023-09-26 ENCOUNTER — Encounter: Payer: Self-pay | Admitting: *Deleted

## 2023-10-02 DIAGNOSIS — E119 Type 2 diabetes mellitus without complications: Secondary | ICD-10-CM | POA: Diagnosis not present

## 2023-10-05 DIAGNOSIS — E11319 Type 2 diabetes mellitus with unspecified diabetic retinopathy without macular edema: Secondary | ICD-10-CM | POA: Diagnosis not present

## 2023-10-05 DIAGNOSIS — E119 Type 2 diabetes mellitus without complications: Secondary | ICD-10-CM | POA: Diagnosis not present

## 2023-10-05 DIAGNOSIS — E1165 Type 2 diabetes mellitus with hyperglycemia: Secondary | ICD-10-CM | POA: Diagnosis not present

## 2023-10-05 DIAGNOSIS — Z794 Long term (current) use of insulin: Secondary | ICD-10-CM | POA: Diagnosis not present

## 2023-10-09 DIAGNOSIS — E119 Type 2 diabetes mellitus without complications: Secondary | ICD-10-CM | POA: Diagnosis not present

## 2023-10-09 DIAGNOSIS — Z23 Encounter for immunization: Secondary | ICD-10-CM | POA: Diagnosis not present

## 2023-10-16 DIAGNOSIS — H35371 Puckering of macula, right eye: Secondary | ICD-10-CM | POA: Diagnosis not present

## 2023-10-16 DIAGNOSIS — H43811 Vitreous degeneration, right eye: Secondary | ICD-10-CM | POA: Diagnosis not present

## 2023-10-16 DIAGNOSIS — E119 Type 2 diabetes mellitus without complications: Secondary | ICD-10-CM | POA: Diagnosis not present

## 2023-10-16 DIAGNOSIS — H43821 Vitreomacular adhesion, right eye: Secondary | ICD-10-CM | POA: Diagnosis not present

## 2023-10-16 DIAGNOSIS — E113413 Type 2 diabetes mellitus with severe nonproliferative diabetic retinopathy with macular edema, bilateral: Secondary | ICD-10-CM | POA: Diagnosis not present

## 2023-10-22 DIAGNOSIS — D225 Melanocytic nevi of trunk: Secondary | ICD-10-CM | POA: Diagnosis not present

## 2023-10-22 DIAGNOSIS — L821 Other seborrheic keratosis: Secondary | ICD-10-CM | POA: Diagnosis not present

## 2023-10-22 DIAGNOSIS — Z85828 Personal history of other malignant neoplasm of skin: Secondary | ICD-10-CM | POA: Diagnosis not present

## 2023-10-22 DIAGNOSIS — C44519 Basal cell carcinoma of skin of other part of trunk: Secondary | ICD-10-CM | POA: Diagnosis not present

## 2023-10-22 DIAGNOSIS — L570 Actinic keratosis: Secondary | ICD-10-CM | POA: Diagnosis not present

## 2023-10-23 DIAGNOSIS — E119 Type 2 diabetes mellitus without complications: Secondary | ICD-10-CM | POA: Diagnosis not present

## 2023-10-29 ENCOUNTER — Telehealth: Payer: Self-pay | Admitting: Internal Medicine

## 2023-10-29 NOTE — Telephone Encounter (Signed)
Good afternoon Dr. Marina Goodell,    Patient's wife called to discuss recall colonoscopy from 2022. Stated patient had a heart procedure at that time and decide to wait on having a colonoscopy. Patient is now the age of 31. Would you like for patient to be schedule for colonoscopy directly or seen in office first? Please advise on scheduling.    Thank you.

## 2023-10-29 NOTE — Telephone Encounter (Signed)
Chart reviewed. Multiple significant comorbidities. Unlikely that he will need surveillance colonoscopy at this point. He is welcome to schedule an office visit with me to discuss in person. Thanks Dr.  Marina Goodell

## 2023-10-30 DIAGNOSIS — E119 Type 2 diabetes mellitus without complications: Secondary | ICD-10-CM | POA: Diagnosis not present

## 2023-10-30 DIAGNOSIS — Z794 Long term (current) use of insulin: Secondary | ICD-10-CM | POA: Diagnosis not present

## 2023-10-30 DIAGNOSIS — E1165 Type 2 diabetes mellitus with hyperglycemia: Secondary | ICD-10-CM | POA: Diagnosis not present

## 2023-10-30 DIAGNOSIS — E11319 Type 2 diabetes mellitus with unspecified diabetic retinopathy without macular edema: Secondary | ICD-10-CM | POA: Diagnosis not present

## 2023-11-09 ENCOUNTER — Encounter: Payer: Self-pay | Admitting: Internal Medicine

## 2023-11-09 DIAGNOSIS — E119 Type 2 diabetes mellitus without complications: Secondary | ICD-10-CM | POA: Diagnosis not present

## 2023-11-09 DIAGNOSIS — Z794 Long term (current) use of insulin: Secondary | ICD-10-CM | POA: Diagnosis not present

## 2023-11-09 NOTE — Telephone Encounter (Signed)
Called patient to schedule. Left voicemail

## 2023-11-27 DIAGNOSIS — Z1212 Encounter for screening for malignant neoplasm of rectum: Secondary | ICD-10-CM | POA: Diagnosis not present

## 2023-11-27 DIAGNOSIS — N1831 Chronic kidney disease, stage 3a: Secondary | ICD-10-CM | POA: Diagnosis not present

## 2023-11-27 DIAGNOSIS — E1129 Type 2 diabetes mellitus with other diabetic kidney complication: Secondary | ICD-10-CM | POA: Diagnosis not present

## 2023-11-27 DIAGNOSIS — E1165 Type 2 diabetes mellitus with hyperglycemia: Secondary | ICD-10-CM | POA: Diagnosis not present

## 2023-11-27 DIAGNOSIS — E785 Hyperlipidemia, unspecified: Secondary | ICD-10-CM | POA: Diagnosis not present

## 2023-11-29 DIAGNOSIS — E119 Type 2 diabetes mellitus without complications: Secondary | ICD-10-CM | POA: Diagnosis not present

## 2023-11-29 DIAGNOSIS — Z794 Long term (current) use of insulin: Secondary | ICD-10-CM | POA: Diagnosis not present

## 2023-11-29 DIAGNOSIS — E11319 Type 2 diabetes mellitus with unspecified diabetic retinopathy without macular edema: Secondary | ICD-10-CM | POA: Diagnosis not present

## 2023-11-29 DIAGNOSIS — E1165 Type 2 diabetes mellitus with hyperglycemia: Secondary | ICD-10-CM | POA: Diagnosis not present

## 2023-12-04 DIAGNOSIS — Z794 Long term (current) use of insulin: Secondary | ICD-10-CM | POA: Diagnosis not present

## 2023-12-04 DIAGNOSIS — E119 Type 2 diabetes mellitus without complications: Secondary | ICD-10-CM | POA: Diagnosis not present

## 2023-12-05 DIAGNOSIS — H35373 Puckering of macula, bilateral: Secondary | ICD-10-CM | POA: Diagnosis not present

## 2023-12-05 DIAGNOSIS — Z961 Presence of intraocular lens: Secondary | ICD-10-CM | POA: Diagnosis not present

## 2023-12-05 DIAGNOSIS — E113292 Type 2 diabetes mellitus with mild nonproliferative diabetic retinopathy without macular edema, left eye: Secondary | ICD-10-CM | POA: Diagnosis not present

## 2023-12-06 DIAGNOSIS — R82998 Other abnormal findings in urine: Secondary | ICD-10-CM | POA: Diagnosis not present

## 2023-12-06 DIAGNOSIS — Z1331 Encounter for screening for depression: Secondary | ICD-10-CM | POA: Diagnosis not present

## 2023-12-06 DIAGNOSIS — Z0000 Encounter for general adult medical examination without abnormal findings: Secondary | ICD-10-CM | POA: Diagnosis not present

## 2023-12-06 DIAGNOSIS — E1129 Type 2 diabetes mellitus with other diabetic kidney complication: Secondary | ICD-10-CM | POA: Diagnosis not present

## 2023-12-06 DIAGNOSIS — Z1339 Encounter for screening examination for other mental health and behavioral disorders: Secondary | ICD-10-CM | POA: Diagnosis not present

## 2023-12-11 DIAGNOSIS — H43821 Vitreomacular adhesion, right eye: Secondary | ICD-10-CM | POA: Diagnosis not present

## 2023-12-11 DIAGNOSIS — H35371 Puckering of macula, right eye: Secondary | ICD-10-CM | POA: Diagnosis not present

## 2023-12-11 DIAGNOSIS — E113413 Type 2 diabetes mellitus with severe nonproliferative diabetic retinopathy with macular edema, bilateral: Secondary | ICD-10-CM | POA: Diagnosis not present

## 2023-12-11 DIAGNOSIS — H43811 Vitreous degeneration, right eye: Secondary | ICD-10-CM | POA: Diagnosis not present

## 2023-12-17 ENCOUNTER — Encounter: Payer: Self-pay | Admitting: Internal Medicine

## 2023-12-17 ENCOUNTER — Ambulatory Visit: Payer: Medicare Other | Admitting: Internal Medicine

## 2023-12-17 VITALS — BP 122/64 | HR 86 | Ht 71.5 in | Wt 224.1 lb

## 2023-12-17 DIAGNOSIS — Z794 Long term (current) use of insulin: Secondary | ICD-10-CM

## 2023-12-17 DIAGNOSIS — Z860100 Personal history of colon polyps, unspecified: Secondary | ICD-10-CM

## 2023-12-17 DIAGNOSIS — Z860101 Personal history of adenomatous and serrated colon polyps: Secondary | ICD-10-CM | POA: Diagnosis not present

## 2023-12-17 DIAGNOSIS — E119 Type 2 diabetes mellitus without complications: Secondary | ICD-10-CM | POA: Diagnosis not present

## 2023-12-17 DIAGNOSIS — Z7901 Long term (current) use of anticoagulants: Secondary | ICD-10-CM

## 2023-12-17 DIAGNOSIS — Z951 Presence of aortocoronary bypass graft: Secondary | ICD-10-CM

## 2023-12-17 DIAGNOSIS — Z800 Family history of malignant neoplasm of digestive organs: Secondary | ICD-10-CM

## 2023-12-17 MED ORDER — SUFLAVE 178.7 G PO SOLR: 1.0000 | Freq: Once | ORAL | 0 refills | Status: AC

## 2023-12-17 NOTE — Patient Instructions (Signed)
X  In addition to purchasing Plenvu at your pharmacy, please purchase the following over the counter: One 119 gram bottle of Miralax powder One box of Dulcolax Laxative 5 mg tablets (you will need 4 tablets)  One 32 oz. bottle of Gatorade (NO RED OR PURPLE)   2 DAYS BEFORE PROCEDURE           DATE: 11/17/2023     In the morning, mix the Entire 119 gram bottle of Miralax powder in a pitcher with 32 oz of room temperature Gatorade , stir until dissolved and refrigerate.    Eat a regular diet minus the above foods- eat breakfast , lunch , and an early light dinner between 4-5 pm   At 3:00 pm take 4 Dulcolax tablets.           4.    Between 5:00 pm-7:00 pm Take the Miralax mixture from the refrigerator  Drink 8 oz. of Miralax mixture every 15 minutes until gone.       5.  Once you have completed  this prep, you are ONLY allowed clear liquids until after the procedure    You will receive your bowel preparation through [facility], which ensures the lowest copay and home delivery, with outreach via text or call from an [phone] number. Please respond promptly to avoid rescheduling. If you are interested in alternative options or have any questions please contact them at [phone]  Your Provider Has Sent Your Bowel Prep Regimen To [facility] What to expect. [facility] will contact you to verify your information and collect your copay, if applicable. Enjoy the comfort of your home while we deliver your prescription to you, free of any shipping charges. Fast, FREE delivery or shipping. [facility] accepts all major insurance benefits and applies discounts & coupons  Have additional questions? [facility]'s patient care team is always here to help.  Chat: [email or web address] Call: [phone] Email: [email or web address] NCPDP: [id number] How will we contact you? Welcome Phone call  a Welcome text and a Checkout link in a text Texts you receive from [phone] Are Not Spam.   *To set  up delivery, you must complete the checkout process via link or speak to one of our patient care representatives. If we are unable to reach you, your prescription may be delayed.  To avoid waiting on hold if you call. Utilize the secure chat feature and request [facility] call you to complete the transaction or expedite your concerns.  You have been scheduled for a colonoscopy. Please follow written instructions given to you at your visit today.   Please pick up your prep supplies at the pharmacy within the next 1-3 days.  If you use inhalers (even only as needed), please bring them with you on the day of your procedure.  DO NOT TAKE 7 DAYS PRIOR TO TEST- Trulicity (dulaglutide) Ozempic, Wegovy (semaglutide) Mounjaro (tirzepatide) Bydureon Bcise (exanatide extended release)  DO NOT TAKE 1 DAY PRIOR TO YOUR TEST Rybelsus (semaglutide) Adlyxin (lixisenatide) Victoza (liraglutide) Byetta (exanatide) ___________________________________________________________________________

## 2023-12-17 NOTE — Progress Notes (Signed)
HISTORY OF PRESENT ILLNESS:  Jason Giles is a 78 y.o. male, [employer] fan, with with multiple medical problems including coronary artery disease status post CABG, paroxysmal atrial flutter on Eliquis, diabetes mellitus on insulin, hypertension, and hyperlipidemia.  Patient presents today regarding surveillance colonoscopy.  He has a personal history of multiple adenomatous and sessile serrated polyps.  Last colonoscopy November 2019 revealed a hyperplastic polyp.  The colonic preparation was somewhat limited.  Follow-up in 3 years with more extensive preparation recommended.  Since that time the patient has undergone coronary artery bypass grafting February 2023.  Doing well from that perspective.  No longer on Plavix.  He did have a history of paroxysmal atrial flutter for which she is on Eliquis.  Follows with Dr. Elease Giles.  There is family history of colon cancer in his family.  Currently his GI review of systems is negative.  In addition to insulin pump, he takes metformin.  Currently out of Jardiance.  He does use MiraLAX for chronic constipation.  Review of blood work from October 2024 shows a BUN of 17 and a creatinine 1.26.  Last CBC in epic April 2024 revealed hemoglobin of 16.4.  REVIEW OF SYSTEMS:  All non-GI ROS negative except for arthritis,  Past Medical History:  Diagnosis Date   Arthritis    Colon polyp    adenomatous   Coronary artery disease    post PTCA and stenting   CORONARY ATHEROSCLEROSIS NATIVE CORONARY ARTERY 12/21/2009   Diabetes mellitus    Diverticulosis    History of kidney stones    Hyperlipidemia    Hypertension    Lung nodules 11/30/2022   CTA 11/29/2022: Ascending thoracic aortic aneurysm 4.1 cm; 2 small right middle lobe nodules most compatible with resolving inflammation/infection.  Repeat 1 year   Macular degeneration of both eyes    Paroxysmal atrial flutter (HCC) 09/11/2023    Past Surgical History:  Procedure Laterality Date   CORONARY  ANGIOPLASTY WITH STENT PLACEMENT     left circumflex artery   CORONARY ARTERY BYPASS GRAFT N/A 12/20/2021   Procedure: CORONARY ARTERY BYPASS GRAFTING (CABG) TIMES THREE, USING LEFT INTERNAL MAMMARY ARTERY AND ENDOSCOPICALLY HARVESTED RIGHT GREATER SAPHENOUS VEIN;  Surgeon: Jason Skains, MD;  Location: [facility] OR;  Service: Open Heart Surgery;  Laterality: N/A;   ENDOVEIN HARVEST OF GREATER SAPHENOUS VEIN Right 12/20/2021   Procedure: ENDOVEIN HARVEST OF GREATER SAPHENOUS VEIN;  Surgeon: Jason Skains, MD;  Location: [facility] OR;  Service: Open Heart Surgery;  Laterality: Right;   LEFT HEART CATH AND CORONARY ANGIOGRAPHY N/A 12/02/2021   Procedure: LEFT HEART CATH AND CORONARY ANGIOGRAPHY;  Surgeon: Jason Pyo, MD;  Location: [facility] INVASIVE CV LAB;  Service: Cardiovascular;  Laterality: N/A;   TEE WITHOUT CARDIOVERSION N/A 12/20/2021   Procedure: TRANSESOPHAGEAL ECHOCARDIOGRAM (TEE);  Surgeon: Jason Skains, MD;  Location: [facility] OR;  Service: Open Heart Surgery;  Laterality: N/A;    Social History Jason Giles  reports that he quit smoking about 38 years ago. His smoking use included cigarettes. He has never used smokeless tobacco. He reports current alcohol use. He reports that he does not use drugs.  family history includes Cancer in his father; Colon cancer in his mother.  Allergies  Allergen Reactions   Oxycodone Other (See Comments)    Hallucinations         PHYSICAL EXAMINATION: Vital signs: BP 122/64   Pulse 86   Ht 5' 11.5" (1.816 m)   Wt 224 lb  2 oz (101.7 kg)   SpO2 96%   BMI 30.82 kg/m   Constitutional: generally well-appearing, no acute distress Psychiatric: alert and oriented x3, cooperative Eyes: extraocular movements intact, anicteric, conjunctiva pink Mouth: oral pharynx moist, no lesions Neck: supple no lymphadenopathy Cardiovascular: heart regular rate and rhythm, no murmur Lungs: clear to auscultation bilaterally Abdomen: soft, nontender,  nondistended, no obvious ascites, no peritoneal signs, normal bowel sounds, no organomegaly Rectal: Deferred until colonoscopy Extremities: no clubbing, cyanosis, or lower extremity edema bilaterally Skin: no lesions on visible extremities Neuro: No focal deficits.  Cranial nerves intact  ASSESSMENT:  1.  Personal history of multiple adenomatous and sessile serrated polyps.  Overdue for surveillance 2.  Family history of colon cancer 3.  Multiple significant medical problems including insulin requiring diabetes mellitus, prior CABG, and paroxysmal atrial flutter on Eliquis   PLAN:  1.  Surveillance colonoscopy.  The patient is HIGH RISK given his comorbidities and the need to address both his diabetic therapies and chronic anticoagulation.The nature of the procedure, as well as the risks, benefits, and alternatives were carefully and thoroughly reviewed with the patient. Ample time for discussion and questions allowed. The patient understood, was satisfied, and agreed to proceed. 2.  Hold oral agents the day of the procedure.  The patient will adjust his insulin pump to reduce the risk of unwanted hypoglycemia. 3.  Hold Eliquis 2 days prior to the procedure.  We will check with cardiology to see if this is acceptable.  Would anticipate resumption of Eliquis immediately post procedure pending findings. Total time of 60 minutes was spent preparing to see the patient, reviewing prior surgical records, cardiac workup, echo, laboratories, and outside notes.  Also, obtaining comprehensive history, performing medically appropriate physical examination, counseling and educating the patient regarding the above listed issues, ordering colonoscopy, and corresponding with cardiology.  Finally, documenting clinical information in the health record.

## 2023-12-20 ENCOUNTER — Telehealth: Payer: Self-pay

## 2023-12-20 NOTE — Telephone Encounter (Signed)
 [facility] Pre-operative Risk Assessment     Request for surgical clearance:     Endoscopy Procedure  What type of surgery is being performed?     Colonoscopy  When is this surgery scheduled?     01/17/2024  What type of clearance is required ?   Pharmacy  Are there any medications that need to be held prior to surgery and how long? Eliquis  - 3 days  Practice name and name of physician performing surgery?      [facility]  What is your office phone and fax number?      Phone- [phone]  Fax- [phone]  Anesthesia type (None, local, MAC, general) ?       MAC   Please route your response to Lucianna Ostlund

## 2023-12-20 NOTE — Telephone Encounter (Signed)
-----   Message from Norleen Kiang sent at 12/17/2023 12:00 PM EST ----- Hi Jason Giles will be undergoing surveillance colonoscopy.  I was hoping to hold his Eliquis  2 days prior to the procedure.  Checking with you to see if this is acceptable.  Thanks so much. Norleen

## 2023-12-24 ENCOUNTER — Telehealth: Payer: Self-pay | Admitting: *Deleted

## 2023-12-24 ENCOUNTER — Telehealth: Payer: Self-pay

## 2023-12-24 NOTE — Telephone Encounter (Signed)
 Lm vm that per Dr. Alroy Aspen patient could hold his Eliquis  for 2 days prior to his procedure.  Asked that he call back to let me know that he received this information.

## 2023-12-24 NOTE — Telephone Encounter (Signed)
 S/w both the pt and his wife. Pt has been scheduled telepreop appt 01/08/24. Med rec and consent are done.      Patient Consent for Virtual Visit        Jason Giles has provided verbal consent on 12/24/2023 for a virtual visit (video or telephone).   CONSENT FOR VIRTUAL VISIT FOR:  Jason Giles  By participating in this virtual visit I agree to the following:  I hereby voluntarily request, consent and authorize [facility] and its employed or contracted physicians, physician assistants, nurse practitioners or other licensed health care professionals (the Practitioner), to provide me with telemedicine health care services (the "Services") as deemed necessary by the treating Practitioner. I acknowledge and consent to receive the Services by the Practitioner via telemedicine. I understand that the telemedicine visit will involve communicating with the Practitioner through live audiovisual communication technology and the disclosure of certain medical information by electronic transmission. I acknowledge that I have been given the opportunity to request an in-person assessment or other available alternative prior to the telemedicine visit and am voluntarily participating in the telemedicine visit.  I understand that I have the right to withhold or withdraw my consent to the use of telemedicine in the course of my care at any time, without affecting my right to future care or treatment, and that the Practitioner or I may terminate the telemedicine visit at any time. I understand that I have the right to inspect all information obtained and/or recorded in the course of the telemedicine visit and may receive copies of available information for a reasonable fee.  I understand that some of the potential risks of receiving the Services via telemedicine include:  Delay or interruption in medical evaluation due to technological equipment failure or disruption; Information transmitted may not be  sufficient (e.g. poor resolution of images) to allow for appropriate medical decision making by the Practitioner; and/or  In rare instances, security protocols could fail, causing a breach of personal health information.  Furthermore, I acknowledge that it is my responsibility to provide information about my medical history, conditions and care that is complete and accurate to the best of my ability. I acknowledge that Practitioner's advice, recommendations, and/or decision may be based on factors not within their control, such as incomplete or inaccurate data provided by me or distortions of diagnostic images or specimens that may result from electronic transmissions. I understand that the practice of medicine is not an exact science and that Practitioner makes no warranties or guarantees regarding treatment outcomes. I acknowledge that a copy of this consent can be made available to me via my patient portal [facility] MyChart), or I can request a printed copy by calling the office of [facility].    I understand that my insurance will be billed for this visit.   I have read or had this consent read to me. I understand the contents of this consent, which adequately explains the benefits and risks of the Services being provided via telemedicine.  I have been provided ample opportunity to ask questions regarding this consent and the Services and have had my questions answered to my satisfaction. I give my informed consent for the services to be provided through the use of telemedicine in my medical care

## 2023-12-24 NOTE — Telephone Encounter (Signed)
-----   Message from Legrand Puma sent at 12/22/2023  1:47 PM EST ----- Thanks Phil! ----- Message ----- From: Lake Pilgrim, MD Sent: 12/21/2023   5:33 PM EST To: Tobin Forts, MD  Rosea Conch,    Mr. Esparza is ok to hold his Eliquis  2 days prior to procedure  He is at low risk for his procedure   PN ----- Message ----- From: Tobin Forts, MD Sent: 12/17/2023  12:00 PM EST To: Jenney Modest, CMA; Lake Pilgrim, MD  Rudy Costain Lella Putt will be undergoing surveillance colonoscopy.  I was hoping to hold his Eliquis  2 days prior to the procedure.  Checking with you to see if this is acceptable.  Thanks so much. Autry Legions

## 2023-12-24 NOTE — Telephone Encounter (Signed)
Information received by patient. 

## 2023-12-24 NOTE — Telephone Encounter (Signed)
 Patient with diagnosis of afib on Eliquis  for anticoagulation.    Procedure: colonoscopy Date of procedure: 01/17/24   CHA2DS2-VASc Score = 5   This indicates a 7.2% annual risk of stroke. The patient's score is based upon: CHF History: 0 HTN History: 1 Diabetes History: 1 Stroke History: 0 Vascular Disease History: 1 Age Score: 2 Gender Score: 0      CrCl 59 ml/min Platelet count : not needed for clinical decision  Per office protocol, patient can hold Eliquis  for 2 days prior to procedure.    **This guidance is not considered finalized until pre-operative APP has relayed final recommendations.**

## 2023-12-24 NOTE — Telephone Encounter (Signed)
 Received message from Dr. Alroy Aspen that patient could hold his Eliquis  for 2 days prior to his procedure.  Asked that he call back to confirm that he got this information.

## 2023-12-24 NOTE — Telephone Encounter (Signed)
 Inbound call from patient's wife stating message was received. Please advise, thank you.

## 2023-12-24 NOTE — Telephone Encounter (Signed)
 S/w both the pt and his wife. Pt has been scheduled telepreop appt 01/08/24. Med rec and consent are done.

## 2023-12-24 NOTE — Telephone Encounter (Signed)
 Primary Cardiologist:Philip Nahser, MD   Preoperative team, please contact this patient and set up a phone call appointment for further preoperative risk assessment. Please obtain consent and complete medication review. Thank you for your help.    Per office protocol, patient can hold Eliquis  for 2 days prior to procedure.    I also confirmed the patient resides in the state of [place] . As per [facility] Medical Board telemedicine laws, the patient must reside in the state in which the provider is licensed.   Gerldine Koch, NP-C  12/24/2023, 3:38 PM [address], [address] Office [phone] Fax [phone]

## 2023-12-29 DIAGNOSIS — E119 Type 2 diabetes mellitus without complications: Secondary | ICD-10-CM | POA: Diagnosis not present

## 2023-12-29 DIAGNOSIS — Z794 Long term (current) use of insulin: Secondary | ICD-10-CM | POA: Diagnosis not present

## 2024-01-07 DIAGNOSIS — K080 Exfoliation of teeth due to systemic causes: Secondary | ICD-10-CM | POA: Diagnosis not present

## 2024-01-07 NOTE — Progress Notes (Unsigned)
 Virtual Visit via Telephone Note   Because of [name] co-morbid illnesses, he is at least at moderate risk for complications without adequate follow up.  This format is felt to be most appropriate for this patient at this time.  Due to technical limitations with video connection Web designer), today's appointment will be conducted as an audio only telehealth visit, and [name] verbally agreed to proceed in this manner.   All issues noted in this document were discussed and addressed.  No physical exam could be performed with this format.  Evaluation Performed:  Preoperative cardiovascular risk assessment _____________   Date:  01/07/2024   Patient ID:  Jason Giles, DOB 1946/02/14, MRN [id number] Patient Location:  Home Provider location:   Office  Primary Care Provider:  Gaspar Garbe, MD Primary Cardiologist:  Kristeen Miss, MD  Chief Complaint / Patient Profile   78 y.o. y/o male with a h/o CAD s/p PCI to left circumflex 2006, CABG 2023, paroxysmal atrial flutter, DM type II, CKD stage III, DM type II HTN, HLD, syncope, orthostatic hypotension, ascending thoracic aortic aneurysm, carotid artery disease, pulmonary nodules who is pending colonoscopy and presents today for telephonic preoperative cardiovascular risk assessment.  History of Present Illness    [name] is a 78 y.o. male who presents via audio/video conferencing for a telehealth visit today.  Pt was last seen in cardiology clinic on 09/12/2023 by Tereso Newcomer, PA.  At that time HARVIE MORUA was doing well with no new concerns.  Patient's blood pressure was well-controlled during visit.  There were no new cardiac medications prescribed or test ordered.  The patient is now pending procedure as outlined above. Since his last visit, he reports that he has been doing well with no new cardiac complaints.  He is still active and does yard work without any difficulty.  He denies chest pain, shortness  of breath, lower extremity edema, fatigue, palpitations, melena, hematuria, hemoptysis, diaphoresis, weakness, presyncope, syncope, orthopnea, and PND.   Past Medical History    Past Medical History:  Diagnosis Date   Arthritis    Colon polyp    adenomatous   Coronary artery disease    post PTCA and stenting   CORONARY ATHEROSCLEROSIS NATIVE CORONARY ARTERY 12/21/2009   Diabetes mellitus    Diverticulosis    History of kidney stones    Hyperlipidemia    Hypertension    Lung nodules 11/30/2022   CTA 11/29/2022: Ascending thoracic aortic aneurysm 4.1 cm; 2 small right middle lobe nodules most compatible with resolving inflammation/infection.  Repeat 1 year   Macular degeneration of both eyes    Paroxysmal atrial flutter (HCC) 09/11/2023   Past Surgical History:  Procedure Laterality Date   CORONARY ANGIOPLASTY WITH STENT PLACEMENT     left circumflex artery   CORONARY ARTERY BYPASS GRAFT N/A 12/20/2021   Procedure: CORONARY ARTERY BYPASS GRAFTING (CABG) TIMES THREE, USING LEFT INTERNAL MAMMARY ARTERY AND ENDOSCOPICALLY HARVESTED RIGHT GREATER SAPHENOUS VEIN;  Surgeon: Corliss Skains, MD;  Location: [facility] OR;  Service: Open Heart Surgery;  Laterality: N/A;   ENDOVEIN HARVEST OF GREATER SAPHENOUS VEIN Right 12/20/2021   Procedure: ENDOVEIN HARVEST OF GREATER SAPHENOUS VEIN;  Surgeon: Corliss Skains, MD;  Location: [facility] OR;  Service: Open Heart Surgery;  Laterality: Right;   LEFT HEART CATH AND CORONARY ANGIOGRAPHY N/A 12/02/2021   Procedure: LEFT HEART CATH AND CORONARY ANGIOGRAPHY;  Surgeon: Jason Pyo, MD;  Location: [facility] INVASIVE CV LAB;  Service: Cardiovascular;  Laterality: N/A;   TEE WITHOUT CARDIOVERSION N/A 12/20/2021   Procedure: TRANSESOPHAGEAL ECHOCARDIOGRAM (TEE);  Surgeon: Corliss Skains, MD;  [facility];  Service: Open Heart Surgery;  Laterality: N/A;    Allergies  Allergies  Allergen Reactions   Oxycodone Other (See Comments)    Hallucinations       Home Medications    Prior to Admission medications   Medication Sig Start Date End Date Taking? Authorizing Provider  acetaminophen (TYLENOL) 500 MG tablet Take 500 mg by mouth as needed for moderate pain (pain score 4-6).    [provider]  apixaban (ELIQUIS) 5 MG TABS tablet Take 1 tablet (5 mg total) by mouth 2 (two) times daily. 05/21/23   Nahser, Deloris Ping, MD  b complex vitamins capsule Take 1 capsule by mouth daily.    [provider]  Docusate Sodium (COLACE PO) Take 1 capsule by mouth daily.    [provider]  fenofibrate micronized (LOFIBRA) 134 MG capsule Take 134 mg by mouth daily before breakfast.  06/02/19   [provider]  Insulin Human (INSULIN PUMP) SOLN Inject 60 each into the skin continuous. Using Novolog    [provider]  insulin lispro (HUMALOG) 100 UNIT/ML injection Inject 60 Units into the skin daily.    [provider]  JARDIANCE 25 MG TABS tablet Take 1 tablet (25 mg total) by mouth daily. 05/21/23   Nahser, Deloris Ping, MD  metFORMIN (GLUCOPHAGE) 850 MG tablet Take 850 mg by mouth 2 (two) times daily with a meal.    [provider]  multivitamin (ONE-A-DAY MEN'S) TABS tablet Take 1 tablet by mouth daily.    [provider]  nitroGLYCERIN (NITROSTAT) 0.4 MG SL tablet Place 1 tablet (0.4 mg total) under the tongue every 5 (five) minutes as needed for chest pain. 10/14/21   Nahser, Deloris Ping, MD  polyethylene glycol (MIRALAX / GLYCOLAX) 17 g packet Take 17 g by mouth daily as needed for moderate constipation.    [provider]  Polyvinyl Alcohol-Povidone (REFRESH OP) Place 1 drop into both eyes daily as needed (dry eyes).    [provider]  rosuvastatin (CRESTOR) 40 MG tablet Take 40 mg by mouth daily.    [provider]  tobramycin (TOBREX) 0.3 % ophthalmic solution Place 1 drop into both eyes See admin instructions. Instill 1 drop into both eyes daily for the day before,  the day of, and the day after eye injections 09/30/21   [provider]    Physical Exam    Vital Signs:  DOMANIC MATUSEK does not have vital signs available for review today.  Given telephonic nature of communication, physical exam is limited. AAOx3. NAD. Normal affect.  Speech and respirations are unlabored.  Accessory Clinical Findings    None  Assessment & Plan    1.  Preoperative Cardiovascular Risk Assessment: -Patient's RCRI score is 6.6%  The patient affirms he has been doing well without any new cardiac symptoms. They are able to achieve 6 METS without cardiac limitations. Therefore, based on ACC/AHA guidelines, the patient would be at acceptable risk for the planned procedure without further cardiovascular testing. The patient was advised that if he develops new symptoms prior to surgery to contact our office to arrange for a follow-up visit, and he verbalized understanding.   The patient was advised that if he develops new symptoms prior to surgery to contact our office to arrange for a follow-up visit, and  he verbalized understanding.  Patient can hold Eliquis for 2 days prior to procedure.   A copy of this note will be routed to requesting surgeon.  Time:   Today, I have spent 8 minutes with the patient with telehealth technology discussing medical history, symptoms, and management plan.     Napoleon Form, Leodis Rains, NP  01/07/2024, 7:50 AM

## 2024-01-08 ENCOUNTER — Ambulatory Visit: Payer: Medicare Other | Attending: Cardiology

## 2024-01-08 DIAGNOSIS — Z01810 Encounter for preprocedural cardiovascular examination: Secondary | ICD-10-CM

## 2024-01-09 ENCOUNTER — Telehealth: Payer: Self-pay | Admitting: Internal Medicine

## 2024-01-09 NOTE — Telephone Encounter (Signed)
 Patient wife called and stated that her husband is getting to different information on when he should stop taking his insulin. Patient wife stated that her husband is on an insulin pump. Patient wife is requesting a call back. Please advise.

## 2024-01-10 NOTE — Telephone Encounter (Signed)
 Spoke to patient's wife and clarified patient will hold his oral diabetic medication the morning of the procedure.  Dr. Wylene Simmer instructed patient on what to do with his insulin pump.

## 2024-01-10 NOTE — Telephone Encounter (Signed)
 Verlon Au it looks like this pt was scheduled at an OV with Dr Marina Goodell. See note below.

## 2024-01-17 ENCOUNTER — Encounter: Payer: Medicare Other | Admitting: Internal Medicine

## 2024-01-18 ENCOUNTER — Other Ambulatory Visit: Payer: Self-pay

## 2024-01-18 DIAGNOSIS — I480 Paroxysmal atrial fibrillation: Secondary | ICD-10-CM

## 2024-01-18 MED ORDER — APIXABAN 5 MG PO TABS: 5.0000 mg | ORAL_TABLET | Freq: Two times a day (BID) | ORAL | 1 refills | Status: DC

## 2024-01-18 NOTE — Telephone Encounter (Signed)
 Prescription refill request for Eliquis received. Indication: Afib  Last office visit: 09/12/23 Jason Giles)  Scr: 1.26 (09/12/23)  Age: 78 Weight: 101.7kg  Appropriate dose. Refill sent.

## 2024-01-31 DIAGNOSIS — E119 Type 2 diabetes mellitus without complications: Secondary | ICD-10-CM | POA: Diagnosis not present

## 2024-01-31 DIAGNOSIS — Z794 Long term (current) use of insulin: Secondary | ICD-10-CM | POA: Diagnosis not present

## 2024-02-05 DIAGNOSIS — H35371 Puckering of macula, right eye: Secondary | ICD-10-CM | POA: Diagnosis not present

## 2024-02-05 DIAGNOSIS — E113413 Type 2 diabetes mellitus with severe nonproliferative diabetic retinopathy with macular edema, bilateral: Secondary | ICD-10-CM | POA: Diagnosis not present

## 2024-02-05 DIAGNOSIS — H43811 Vitreous degeneration, right eye: Secondary | ICD-10-CM | POA: Diagnosis not present

## 2024-02-05 DIAGNOSIS — H43821 Vitreomacular adhesion, right eye: Secondary | ICD-10-CM | POA: Diagnosis not present

## 2024-02-08 ENCOUNTER — Telehealth: Payer: Self-pay | Admitting: Internal Medicine

## 2024-02-08 NOTE — Telephone Encounter (Signed)
 Spoke to patient's wife and clarified that I would send new instructions Monday.  They are going to clarify what Dr. Wylene Simmer wants patient to do with his diabetic medications and insulin pump

## 2024-02-08 NOTE — Telephone Encounter (Signed)
 Jason Giles,  We had to reschedule this patient's procedure. Patient is requesting new prep instructions be sent via mychart.  Also, he has already stopped taking jardiance and is wanting to know when he can restart?

## 2024-02-12 NOTE — Telephone Encounter (Signed)
 New instructions completed and sent to [facility]

## 2024-02-13 ENCOUNTER — Encounter: Payer: Medicare Other | Admitting: Internal Medicine

## 2024-02-26 DIAGNOSIS — K080 Exfoliation of teeth due to systemic causes: Secondary | ICD-10-CM | POA: Diagnosis not present

## 2024-03-01 DIAGNOSIS — E119 Type 2 diabetes mellitus without complications: Secondary | ICD-10-CM | POA: Diagnosis not present

## 2024-03-01 DIAGNOSIS — E1165 Type 2 diabetes mellitus with hyperglycemia: Secondary | ICD-10-CM | POA: Diagnosis not present

## 2024-03-01 DIAGNOSIS — E11319 Type 2 diabetes mellitus with unspecified diabetic retinopathy without macular edema: Secondary | ICD-10-CM | POA: Diagnosis not present

## 2024-03-01 DIAGNOSIS — Z794 Long term (current) use of insulin: Secondary | ICD-10-CM | POA: Diagnosis not present

## 2024-03-09 NOTE — Progress Notes (Unsigned)
 Jason Giles Date of Birth  [birth year] [facility] / [facility] [address]     [address] [place], [place]  [address] [phone]  Fax  [phone]  Problem List 1. CAD - stenting 2011 ( Taxus DES in May , 2012)  CABG 3/23 2. Diabetes Mellitus  3. Essential Hypertension    Previous Notes:   Pt is doing well.  Has retired since last visit. No chest pain .  He denies any angina or dyspnea.  Dec. 8.2015:  Jason Giles is a 78 yo who I follow for CAD, HTN, hyperlipidemia.  He went to the old office today. No having any problems. Is scheduled to have a colonoscopy and cataract surgery.     Oct. 4, 2016:  Jason Giles is doing well.   No CP or dyspnea.   Has been having some left arm discomfort.   Noticed it when he got up from bed to use the bathroom.   No pain .   Possibly overworked it the day before .  Had done some heavy lifting the day of this discomfort. Has felt fine since.  Has been doing this normal activities since   Oct. 31, 2017:   Jason Giles is seen back for follow up of his coronary artery disease.  He has a history of essential hypertension and diabetes.  We discussed Inga Manges.   His brother was the tennis coach up in [place] and he knows [place] .   No CP or dyspnea. Cherlynn Cornfield to the dermatology - Basal cell CA on his nose Gets eye injections every 4 weeks  ( Macular degeneration )   Dec. 6, 2018:  Jason Giles is seen today for a follow-up visit.  He has a history of coronary artery disease-status post stenting  Still having issues with his eyes  No CP , no dyspnea.    Dec. 9, 2019:  Doing well .    Hx of PCI  - no angina  Needs to work on [employer]. Today is 234 lbs.   Labs from Tisove[place]'s office  Chol = 101 HDL = 31 LDL = 44 Trigs = 130.   Sept. 15, 2020  Jason Giles is seen today for further evaluation of his CAD and HTN  He was in the hospital following an episode of syncope in August.  Clinically I thought that his symptoms were consistent with  orthostatic hypotension.  He had not eaten or had much to drink earlier that day.  We have encouraged him to drink more fluids. We have ordered a cardiac event monitor.  He has still had some lightheadedness.  Gets dizzy when he gets up in the am .   February 02, 2020: Jason Giles is seen today for follow-up of his coronary artery disease and hypertension. He had a presyncopal episode.  It is not clear whether this was due to volume depletion or a diabetic issue.  We stopped his metoprolol  and he has felt better. Exercising some ,  Walks 2 - 3 times a week .  No CP  Still eating salt  Trying out a new insulin  pump  Labs from his primary medical doctor reveal a total cholesterol of 110.  His HDL is 36.  LDL is 54.  Triglyceride levels 101.  Hemoglobin A1c is 7.4.  Hemoglobin is 17.5.  February 04, 2021: Jason Giles is seen today for follow up of his CAD A month ago , he fell off a brick step,  Was thought to have a viral infection .  No CP , no dyspnea  Labs from his primary medical doctor in November, 2021 shows a total cholesterol of 94.  The HDL is 37.  LDL is 34.  Triglyceride levels 115.   Dec. 2, 2022 Jason Giles is seen for follow up of his CAD  Seen with wife, Adah Acron  Occurred 5 days ago Uneasy feeling in his chest when  he went to bed Not exertional , Lasted for several hours  Did not feel like indigestion  Has been outside working in the yard this week without any chest pain since that time  Has had more fatigue for the past several months .  Has not been exercisng all that regularly for the past year or so    Jan. 16, 2023: Jason Giles is seen today for follow up of some episodes of chest pain .  Coronary artery CT angio showed a CAC score of 4147. LM - 50% mid-distal stenosis LAD - severe calcified stenosis LCx-  severe mid LCx disease  RCA - moderate prox - mid disease  Has not had any further episodes of CP since the original CP several months ago .   He has a history of coronary artery  disease.  In 2006 we placed a stent in the left circumflex artery ( 2.75 x 12 mm quantum Maverick) .  He had moderate irregularities elsewhere which were treated medically.  Has 2 tine pulmonary nodules  He is here to schedule cath   No regular exercise. Does yard work on most days  so he's is very active.    Walks 3-4 days a week . A mile at a time  Has lost some weight,  12 lbs  Taking a lower dose of insulin  with good glucose levels   Is on amiodarone  for post op PAF  Will D/C the amio in May  Cont metoprolol    February 22, 2023 Jason Giles is seen for follow up of his CAD  Not as much energy  He had some palpitations  We wearing a monitor  His insulsin pump had failed  Wife thought that the Aflutter was related to his pump failure  He was started on Jason Giles  at the time  Exercising 4 times a week , Walks and cardio machines.    March 10, 2024 Jason Giles is seen for follow up of his CAD ,  CABG in 2023, atrial flutter  Is on Jason Giles   Asc . Aortic aneurims  CKD HTN HLD    Echo from Jan. 2023 shows normal LV ef of 60-65% Grade I DD  Mild MR   Staying fairly active  Walks 3 days a week .    Planted 9 tomato plants and cucumber plants    Labs from dr. Kandyce Ort - Jan 2025 Chol = 97 HDL = 39 LDL = 38 Trigs = 604    Current Outpatient Medications on File Prior to Visit  Medication Sig Dispense Refill   apixaban  (Jason Giles ) 5 MG TABS tablet Take 1 tablet (5 mg total) by mouth 2 (two) times daily. 180 tablet 1   b complex vitamins capsule Take 1 capsule by mouth daily.     Docusate Sodium  (COLACE PO) Take 1 capsule by mouth daily.     fenofibrate  micronized (LOFIBRA) 134 MG capsule Take 134 mg by mouth daily before breakfast.      Insulin  Human (INSULIN  PUMP) SOLN Inject 60 each into the skin continuous. Using Novolog      insulin  lispro (HUMALOG) 100 UNIT/ML injection Inject 60 Units into  the skin daily.     JARDIANCE  25 MG TABS tablet Take 1 tablet (25 mg total) by mouth daily.  90 tablet 2   metFORMIN  (GLUCOPHAGE ) 850 MG tablet Take 850 mg by mouth 2 (two) times daily with a meal.     multivitamin (ONE-A-DAY MEN'S) TABS tablet Take 1 tablet by mouth daily.     polyethylene glycol (MIRALAX / GLYCOLAX) 17 g packet Take 17 g by mouth daily as needed for moderate constipation.     Polyvinyl Alcohol-Povidone (REFRESH OP) Place 1 drop into both eyes daily as needed (dry eyes).     rosuvastatin  (CRESTOR ) 40 MG tablet Take 40 mg by mouth daily.     tobramycin (TOBREX) 0.3 % ophthalmic solution Place 1 drop into both eyes as needed. Instill 1 drop into both eyes daily for the day before, the day of, and the day after eye injections     acetaminophen  (TYLENOL ) 500 MG tablet Take 500 mg by mouth as needed for moderate pain (pain score 4-6). (Patient not taking: Reported on 03/10/2024)     nitroGLYCERIN  (NITROSTAT ) 0.4 MG SL tablet Place 1 tablet (0.4 mg total) under the tongue every 5 (five) minutes as needed for chest pain. (Patient not taking: Reported on 03/10/2024) 25 tablet 1   No current facility-administered medications on file prior to visit.    Allergies  Allergen Reactions   Oxycodone  Other (See Comments)    Hallucinations      Past Medical History:  Diagnosis Date   Arthritis    Colon polyp    adenomatous   Coronary artery disease    post PTCA and stenting   CORONARY ATHEROSCLEROSIS NATIVE CORONARY ARTERY 12/21/2009   Diabetes mellitus    Diverticulosis    History of kidney stones    Hyperlipidemia    Hypertension    Lung nodules 11/30/2022   CTA 11/29/2022: Ascending thoracic aortic aneurysm 4.1 cm; 2 small right middle lobe nodules most compatible with resolving inflammation/infection.  Repeat 1 year   Macular degeneration of both eyes    Paroxysmal atrial flutter (HCC) 09/11/2023    Past Surgical History:  Procedure Laterality Date   CORONARY ANGIOPLASTY WITH STENT PLACEMENT     left circumflex artery   CORONARY ARTERY BYPASS GRAFT N/A 12/20/2021    Procedure: CORONARY ARTERY BYPASS GRAFTING (CABG) TIMES THREE, USING LEFT INTERNAL MAMMARY ARTERY AND ENDOSCOPICALLY HARVESTED RIGHT GREATER SAPHENOUS VEIN;  Surgeon: Hilarie Lovely, MD;  Location: [facility] OR;  Service: Open Heart Surgery;  Laterality: N/A;   ENDOVEIN HARVEST OF GREATER SAPHENOUS VEIN Right 12/20/2021   Procedure: ENDOVEIN HARVEST OF GREATER SAPHENOUS VEIN;  Surgeon: Hilarie Lovely, MD;  Location: [facility] OR;  Service: Open Heart Surgery;  Laterality: Right;   LEFT HEART CATH AND CORONARY ANGIOGRAPHY N/A 12/02/2021   Procedure: LEFT HEART CATH AND CORONARY ANGIOGRAPHY;  Surgeon: Kyra Phy, MD;  Location: [facility] INVASIVE CV LAB;  Service: Cardiovascular;  Laterality: N/A;   TEE WITHOUT CARDIOVERSION N/A 12/20/2021   Procedure: TRANSESOPHAGEAL ECHOCARDIOGRAM (TEE);  Surgeon: Hilarie Lovely, MD;  Location: [facility] OR;  Service: Open Heart Surgery;  Laterality: N/A;    Social History   Tobacco Use  Smoking Status Former   Current packs/day: 0.00   Types: Cigarettes   Quit date: 11/13/1985   Years since quitting: 38.3  Smokeless Tobacco Never    Social History   Substance and Sexual Activity  Alcohol Use Yes   Alcohol/week: 0.0 standard drinks of alcohol   Comment:  Occassionally    Family History  Problem Relation Age of Onset   Cancer Father        Brain Tumor   Colon cancer Mother    Colon polyps Neg Hx    Kidney disease Neg Hx    Diabetes Neg Hx    Esophageal cancer Neg Hx     Reviw of Systems:  Reviewed in the HPI.  All other systems are negative.    Physical Exam: Blood pressure 130/72, pulse 79, height 5' 11.5" (1.816 m), weight 221 lb 3.2 oz (100.3 kg), SpO2 97%.       GEN:  Well nourished, well developed in no acute distress HEENT: Normal NECK: No JVD; No carotid bruits LYMPHATICS: No lymphadenopathy CARDIAC: RRR , no murmurs, rubs, gallops RESPIRATORY:  Clear to auscultation without rales, wheezing or rhonchi  ABDOMEN: Soft, non-tender,  non-distended MUSCULOSKELETAL:  No edema; No deformity  SKIN: Warm and dry NEUROLOGIC:  Alert and oriented x 3    ECG:     EKG Interpretation Date/Time:  Monday March 10 2024 09:21:24 EDT Ventricular Rate:  79 PR Interval:  232 QRS Duration:  88 QT Interval:  382 QTC Calculation: 438 R Axis:   38  Text Interpretation: Sinus rhythm with 1st degree A-V block When compared with ECG of 21-Dec-2021 06:52, ST no longer elevated in Anterolateral leads Confirmed by Ahmad Alert ([id number]) on 03/10/2024 9:41:15 AM      Assessment / Plan:   1. CAD - stenting 2006 ( Taxus DES in May , 2012) -     He now has LM and prox  LM disease  Had CABG in Feb. , 2023  Is now on Jason Giles ,   No chest pain    2. Diabetes Mellitus  -    3.  Orthostatic hypotension:      3. Essential Hypertension  -       BP looks great   4.  Hyperlipidemia :   lipids look great      Ahmad Alert, MD  03/10/2024 9:40 AM    [facility] [address] [place],  [address] [place], [place]  [address] Pager [phone] Phone: [phone]; Fax: [phone]

## 2024-03-10 ENCOUNTER — Encounter: Payer: Self-pay | Admitting: Cardiovascular Disease

## 2024-03-10 ENCOUNTER — Ambulatory Visit: Payer: Medicare Other | Attending: Cardiovascular Disease | Admitting: Cardiovascular Disease

## 2024-03-10 VITALS — BP 130/72 | HR 79 | Ht 71.5 in | Wt 221.2 lb

## 2024-03-10 DIAGNOSIS — I10 Essential (primary) hypertension: Secondary | ICD-10-CM

## 2024-03-10 MED ORDER — NITROGLYCERIN 0.4 MG SL SUBL: 0.4000 mg | SUBLINGUAL_TABLET | SUBLINGUAL | 1 refills | Status: AC | PRN

## 2024-03-10 NOTE — Patient Instructions (Signed)
 Medication Instructions:  Your physician recommends that you continue on your current medications as directed. Please refer to the Current Medication list given to you today. *If you need a refill on your cardiac medications before your next appointment, please call your pharmacy*  Follow-Up: At [facility], you and your health needs are our priority.  As part of our continuing mission to provide you with exceptional heart care, our providers are all part of one team.  This team includes your primary Cardiologist (physician) and Advanced Practice Providers or APPs (Physician Assistants and Nurse Practitioners) who all work together to provide you with the care you need, when you need it.  Your next appointment:   1 year(s)  Provider:   Dr Rolm Clos

## 2024-03-13 DIAGNOSIS — E1165 Type 2 diabetes mellitus with hyperglycemia: Secondary | ICD-10-CM | POA: Diagnosis not present

## 2024-03-13 DIAGNOSIS — I1310 Hypertensive heart and chronic kidney disease without heart failure, with stage 1 through stage 4 chronic kidney disease, or unspecified chronic kidney disease: Secondary | ICD-10-CM | POA: Diagnosis not present

## 2024-03-17 DIAGNOSIS — C44599 Other specified malignant neoplasm of skin of other part of trunk: Secondary | ICD-10-CM | POA: Diagnosis not present

## 2024-03-17 DIAGNOSIS — C4442 Squamous cell carcinoma of skin of scalp and neck: Secondary | ICD-10-CM | POA: Diagnosis not present

## 2024-03-21 DIAGNOSIS — E11319 Type 2 diabetes mellitus with unspecified diabetic retinopathy without macular edema: Secondary | ICD-10-CM | POA: Diagnosis not present

## 2024-03-21 DIAGNOSIS — E119 Type 2 diabetes mellitus without complications: Secondary | ICD-10-CM | POA: Diagnosis not present

## 2024-03-21 DIAGNOSIS — Z794 Long term (current) use of insulin: Secondary | ICD-10-CM | POA: Diagnosis not present

## 2024-03-21 DIAGNOSIS — E1165 Type 2 diabetes mellitus with hyperglycemia: Secondary | ICD-10-CM | POA: Diagnosis not present

## 2024-03-28 DIAGNOSIS — E119 Type 2 diabetes mellitus without complications: Secondary | ICD-10-CM | POA: Diagnosis not present

## 2024-03-28 DIAGNOSIS — E11319 Type 2 diabetes mellitus with unspecified diabetic retinopathy without macular edema: Secondary | ICD-10-CM | POA: Diagnosis not present

## 2024-03-28 DIAGNOSIS — Z794 Long term (current) use of insulin: Secondary | ICD-10-CM | POA: Diagnosis not present

## 2024-03-28 DIAGNOSIS — E1165 Type 2 diabetes mellitus with hyperglycemia: Secondary | ICD-10-CM | POA: Diagnosis not present

## 2024-03-31 ENCOUNTER — Telehealth: Payer: Self-pay | Admitting: Internal Medicine

## 2024-03-31 DIAGNOSIS — E1165 Type 2 diabetes mellitus with hyperglycemia: Secondary | ICD-10-CM | POA: Diagnosis not present

## 2024-03-31 DIAGNOSIS — E119 Type 2 diabetes mellitus without complications: Secondary | ICD-10-CM | POA: Diagnosis not present

## 2024-03-31 DIAGNOSIS — Z794 Long term (current) use of insulin: Secondary | ICD-10-CM | POA: Diagnosis not present

## 2024-03-31 DIAGNOSIS — E11319 Type 2 diabetes mellitus with unspecified diabetic retinopathy without macular edema: Secondary | ICD-10-CM | POA: Diagnosis not present

## 2024-03-31 NOTE — Telephone Encounter (Signed)
 Patient wife called and stated that she would like a call back regarding some question she has with her husbands prep medication. Patient wife is requesting a call back from [place]. Please advise.

## 2024-04-01 DIAGNOSIS — L988 Other specified disorders of the skin and subcutaneous tissue: Secondary | ICD-10-CM | POA: Diagnosis not present

## 2024-04-01 DIAGNOSIS — H43811 Vitreous degeneration, right eye: Secondary | ICD-10-CM | POA: Diagnosis not present

## 2024-04-01 DIAGNOSIS — C44529 Squamous cell carcinoma of skin of other part of trunk: Secondary | ICD-10-CM | POA: Diagnosis not present

## 2024-04-01 DIAGNOSIS — H35371 Puckering of macula, right eye: Secondary | ICD-10-CM | POA: Diagnosis not present

## 2024-04-01 DIAGNOSIS — E113413 Type 2 diabetes mellitus with severe nonproliferative diabetic retinopathy with macular edema, bilateral: Secondary | ICD-10-CM | POA: Diagnosis not present

## 2024-04-01 DIAGNOSIS — H43821 Vitreomacular adhesion, right eye: Secondary | ICD-10-CM | POA: Diagnosis not present

## 2024-04-01 NOTE — Telephone Encounter (Signed)
 Left message for patient to call back

## 2024-04-01 NOTE — Telephone Encounter (Signed)
 I have spoken to Jason Giles to give updated instructions using Miralax and Suflave . Patient has been rescheduled multiple times with different preps so there was some confusion about how to take the prep appropriately. Questions have now been answered to Jason Giles's satisfaction.

## 2024-04-02 NOTE — Telephone Encounter (Signed)
 Jason Giles's question regarding dulcolax has been answered and she denies any additional questions at this time.

## 2024-04-02 NOTE — Telephone Encounter (Signed)
 Inbound call from patient's wife requesting a call to discuss further questions she has regarding patient's colonoscopy. Requests to give more detail to Dottie. Wife is requesting a call back to her. Please advise, thank you.

## 2024-04-04 DIAGNOSIS — E11319 Type 2 diabetes mellitus with unspecified diabetic retinopathy without macular edema: Secondary | ICD-10-CM | POA: Diagnosis not present

## 2024-04-04 DIAGNOSIS — E119 Type 2 diabetes mellitus without complications: Secondary | ICD-10-CM | POA: Diagnosis not present

## 2024-04-04 DIAGNOSIS — E1165 Type 2 diabetes mellitus with hyperglycemia: Secondary | ICD-10-CM | POA: Diagnosis not present

## 2024-04-04 DIAGNOSIS — Z794 Long term (current) use of insulin: Secondary | ICD-10-CM | POA: Diagnosis not present

## 2024-04-08 ENCOUNTER — Encounter: Payer: Self-pay | Admitting: Internal Medicine

## 2024-04-08 ENCOUNTER — Ambulatory Visit: Admitting: Internal Medicine

## 2024-04-08 VITALS — BP 122/66 | HR 72 | Temp 97.7°F | Resp 22 | Ht 71.0 in | Wt 224.0 lb

## 2024-04-08 DIAGNOSIS — Z1211 Encounter for screening for malignant neoplasm of colon: Secondary | ICD-10-CM

## 2024-04-08 DIAGNOSIS — D122 Benign neoplasm of ascending colon: Secondary | ICD-10-CM

## 2024-04-08 DIAGNOSIS — Z860100 Personal history of colon polyps, unspecified: Secondary | ICD-10-CM

## 2024-04-08 MED ORDER — SODIUM CHLORIDE 0.9 % IV SOLN: 500.0000 mL | Freq: Once | INTRAVENOUS | Status: DC

## 2024-04-08 NOTE — Progress Notes (Signed)
 A/o x 3, VSS, gd SR's, pleased with anesthesia, report to RN

## 2024-04-08 NOTE — Op Note (Signed)
 [facility] Patient Name: Jason Giles Procedure Date: 04/08/2024 9:48 AM MRN: [id number] Endoscopist: Murel Arlington. Elvin Hammer , MD, [id number] Age: 78 Referring MD:  Date of Birth: 1946/01/08 Gender: Male Account #: [id number] Procedure:                Colonoscopy with cold snare polypectomy x 1 Indications:              High risk colon cancer surveillance: Personal                            history of multiple (3 or more) adenomas, High risk                            colon cancer surveillance: Personal history of                            sessile serrated colon polyp (less than 10 mm in                            size) with no dysplasia. Multiple prior                            examinations with Dr. Adan Holms as well as myself.                            Last examination 2019. Medicines:                Monitored Anesthesia Care Procedure:                Pre-Anesthesia Assessment:                           - Prior to the procedure, a History and Physical                            was performed, and patient medications and                            allergies were reviewed. The patient's tolerance of                            previous anesthesia was also reviewed. The risks                            and benefits of the procedure and the sedation                            options and risks were discussed with the patient.                            All questions were answered, and informed consent                            was obtained. Prior Anticoagulants: The patient has  taken Eliquis  (apixaban ), last dose was 3 days                            prior to procedure. ASA Grade Assessment: III - A                            patient with severe systemic disease. After                            reviewing the risks and benefits, the patient was                            deemed in satisfactory condition to undergo the                             procedure.                           After obtaining informed consent, the colonoscope                            was passed under direct vision. Throughout the                            procedure, the patient's blood pressure, pulse, and                            oxygen saturations were monitored continuously. The                            Olympus Scope SN: [id number] was introduced through                            the anus and advanced to the the cecum, identified                            by appendiceal orifice and ileocecal valve. The                            ileocecal valve, appendiceal orifice, and rectum                            were photographed. The quality of the bowel                            preparation was good. The colonoscopy was performed                            without difficulty. The patient tolerated the                            procedure well. The bowel preparation used was  SUPREP via single dose instruction. Scope In: 10:10:02 AM Scope Out: 10:28:07 AM Scope Withdrawal Time: 0 hours 12 minutes 40 seconds  Total Procedure Duration: 0 hours 18 minutes 5 seconds  Findings:                 A 4 mm polyp was found in the ascending colon. The                            polyp was sessile. The polyp was removed with a                            cold snare. Resection and retrieval were complete.                           The exam was otherwise without abnormality on                            direct and retroflexion views. Complications:            No immediate complications. Estimated blood loss:                            None. Estimated Blood Loss:     Estimated blood loss: none. Impression:               - One 4 mm polyp in the ascending colon, removed                            with a cold snare. Resected and retrieved.                           - The examination was otherwise normal on direct                            and  retroflexion views. Recommendation:           - Repeat colonoscopy is not recommended for                            surveillance.                           - Resume Eliquis  (apixaban ) today at prior dose.                           - Patient has a contact number available for                            emergencies. The signs and symptoms of potential                            delayed complications were discussed with the                            patient. Return to normal activities tomorrow.  Written discharge instructions were provided to the                            patient.                           - Resume previous diet.                           - Continue present medications.                           - Await pathology results. Murel Arlington. Elvin Hammer, MD 04/08/2024 10:33:20 AM This report has been signed electronically.

## 2024-04-08 NOTE — Patient Instructions (Signed)
 Resume your Eliquis  today per Dr Dr Elvin Hammer. Resume your previous diet today.   Resume all of your previous medications today as ordered    YOU HAD AN ENDOSCOPIC PROCEDURE TODAY AT THE [facility]:   Refer to the procedure report that was given to you for any specific questions about what was found during the examination.  If the procedure report does not answer your questions, please call your gastroenterologist to clarify.  If you requested that your care partner not be given the details of your procedure findings, then the procedure report has been included in a sealed envelope for you to review at your convenience later.  YOU SHOULD EXPECT: Some feelings of bloating in the abdomen. Passage of more gas than usual.  Walking can help get rid of the air that was put into your GI tract during the procedure and reduce the bloating. If you had a lower endoscopy (such as a colonoscopy or flexible sigmoidoscopy) you may notice spotting of blood in your stool or on the toilet paper. If you underwent a bowel prep for your procedure, you may not have a normal bowel movement for a few days.  Please Note:  You might notice some irritation and congestion in your nose or some drainage.  This is from the oxygen used during your procedure.  There is no need for concern and it should clear up in a day or so.  SYMPTOMS TO REPORT IMMEDIATELY:  Following lower endoscopy (colonoscopy or flexible sigmoidoscopy):  Excessive amounts of blood in the stool  Significant tenderness or worsening of abdominal pains  Swelling of the abdomen that is new, acute  Fever of 100F or higher   For urgent or emergent issues, a gastroenterologist can be reached at any hour by calling [phone]. Do not use MyChart messaging for urgent concerns.    DIET:  We do recommend a small meal at first, but then you may proceed to your regular diet.  Drink plenty of fluids but you should avoid alcoholic beverages for 24  hours.  ACTIVITY:  You should plan to take it easy for the rest of today and you should NOT DRIVE or use heavy machinery until tomorrow (because of the sedation medicines used during the test).    FOLLOW UP: Our staff will call the number listed on your records the next business day following your procedure.  We will call around 7:15- 8:00 am to check on you and address any questions or concerns that you may have regarding the information given to you following your procedure. If we do not reach you, we will leave a message.     If any biopsies were taken you will be contacted by phone or by letter within the next 1-3 weeks.  Please call us  at [phone] if you have not heard about the biopsies in 3 weeks.    SIGNATURES/CONFIDENTIALITY: You and/or your care partner have signed paperwork which will be entered into your electronic medical record.  These signatures attest to the fact that that the information above on your After Visit Summary has been reviewed and is understood.  Full responsibility of the confidentiality of this discharge information lies with you and/or your care-partner.

## 2024-04-08 NOTE — Progress Notes (Signed)
 Expand All Collapse All HISTORY OF PRESENT ILLNESS:   Jason Giles is a 78 y.o. male, [employer] fan, with with multiple medical problems including coronary artery disease status post CABG, paroxysmal atrial flutter on Eliquis , diabetes mellitus on insulin , hypertension, and hyperlipidemia.  Patient presents today regarding surveillance colonoscopy.  He has a personal history of multiple adenomatous and sessile serrated polyps.  Last colonoscopy November 2019 revealed a hyperplastic polyp.  The colonic preparation was somewhat limited.  Follow-up in 3 years with more extensive preparation recommended.   Since that time the patient has undergone coronary artery bypass grafting February 2023.  Doing well from that perspective.  No longer on Plavix .  He did have a history of paroxysmal atrial flutter for which she is on Eliquis .  Follows with Dr. Alroy Aspen.  There is family history of colon cancer in his family.  Currently his GI review of systems is negative.  In addition to insulin  pump, he takes metformin .  Currently out of [place]ard[place] .  He does use MiraLAX for chronic constipation.   Review of blood work from October 2024 shows a BUN of 17 and a creatinine 1.26.  Last CBC in epic April 2024 revealed hemoglobin of 16.4.   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  Repeat 1 year   Macular degeneration of both eyes     Paroxysmal atrial flutter (HCC) 09/11/2023               Past  Surgical History:  Procedure Laterality Date   CORONARY ANGIOPLASTY WITH STENT PLACEMENT        left circumflex artery   CORONARY ARTERY BYPASS GRAFT N/A 12/20/2021    Procedure: CORONARY ARTERY BYPASS GRAFTING (CABG) TIMES THREE, USING LEFT INTERNAL MAMMARY ARTERY AND ENDOSCOPICALLY HARVESTED RIGHT GREATER SAPHENOUS VEIN;  Surgeon: Hilarie Lovely, MD;  Location: [facility] OR;  Service: Open Heart Surgery;  Laterality: N/A;   ENDOVEIN HARVEST OF GREATER SAPHENOUS VEIN Right 12/20/2021    Procedure: ENDOVEIN HARVEST OF GREATER SAPHENOUS VEIN;  Surgeon: Hilarie Lovely, MD;  Location: [facility] OR;  Service: Open Heart Surgery;  Laterality: Right;   LEFT HEART CATH AND CORONARY ANGIOGRAPHY N/A 12/02/2021    Procedure: LEFT HEART CATH AND CORONARY ANGIOGRAPHY;  Surgeon: Kyra Phy, MD;  Location: [facility] IN[facility];  Service: Cardiovascular;  Laterality: N/A;   TEE WITHOUT CARDIOVERSION N/A 12/20/2021    Procedure: TRANSESOPHAGEAL ECHOCARDIOGRAM (TEE);  Surgeon: Hilarie Lovely, MD;  Location: [facility] OR;  Service: Open Heart Surgery;  Laterality: N/A;          Social History Jason Giles  reports that he quit smoking about 38 years ago. His smoking use included cigarettes. He has never used smokeless tobacco. He reports current alcohol  use. He reports that he does not use drugs.   family history includes Cancer in his  father; Colon cancer in his mother.   Allergies       Allergies  Allergen Reactions   Oxycodone  Other (See Comments)      Hallucinations               PHYSICAL EXAMINATION: Vital signs: BP 122/64   Pulse 86   Ht 5' 11.5" (1.816 m)   Wt 224 lb 2 oz (101.7 kg)   SpO2 96%   BMI 30.82 kg/m   Constitutional: generally well-appearing, no acute distress Psychiatric: alert and oriented x3, cooperative Eyes: extraocular movements intact, anicteric, conjunctiva pink Mouth: oral pharynx moist, no lesions Neck: supple no lymphadenopathy Cardiovascular: heart regular rate  and rhythm, no murmur Lungs: clear to auscultation bilaterally Abdomen: soft, nontender, nondistended, no obvious ascites, no peritoneal signs, normal bowel sounds, no organomegaly Rectal: Deferred until colonoscopy Extremities: no clubbing, cyanosis, or lower extremity edema bilaterally Skin: no lesions on visible extremities Neuro: No focal deficits.  Cranial nerves intact   ASSESSMENT:   1.  Personal history of multiple adenomatous and sessile serrated polyps.  Overdue for surveillance 2.  Family history of colon cancer 3.  Multiple significant medical problems including insulin  requiring diabetes mellitus, prior CABG, and paroxysmal atrial flutter on Eliquis      PLAN:   1.  Surveillance colonoscopy.  The patient is HIGH RISK given his comorbidities and the need to address both his diabetic therapies and chronic anticoagulation.The nature of the procedure, as well as the risks, benefits, and alternatives were carefully and thoroughly reviewed with the patient. Ample time for discussion and questions allowed. The patient understood, was satisfied, and agreed to proceed. 2.  Hold oral agents the day of the procedure.  The patient will adjust his insulin  pump to reduce the risk of unwanted hypoglycemia. 3.  Hold Eliquis  2 days prior to the procedure.  We will check with cardiology to see if this is acceptable.  Would anticipate resumption of Eliquis  immediately post procedure pending findings.      Recent complete H&P with myself as outlined above.  No interval change in history or physical exam.  Now for colonoscopy.

## 2024-04-08 NOTE — Progress Notes (Signed)
 Pt's states no medical or surgical changes since previsit or office visit.

## 2024-04-09 ENCOUNTER — Telehealth: Payer: Self-pay

## 2024-04-09 NOTE — Telephone Encounter (Signed)
 Attempted f/u call. No answer, left VM.

## 2024-04-11 DIAGNOSIS — E11319 Type 2 diabetes mellitus with unspecified diabetic retinopathy without macular edema: Secondary | ICD-10-CM | POA: Diagnosis not present

## 2024-04-11 DIAGNOSIS — E1165 Type 2 diabetes mellitus with hyperglycemia: Secondary | ICD-10-CM | POA: Diagnosis not present

## 2024-04-11 DIAGNOSIS — Z794 Long term (current) use of insulin: Secondary | ICD-10-CM | POA: Diagnosis not present

## 2024-04-11 DIAGNOSIS — E119 Type 2 diabetes mellitus without complications: Secondary | ICD-10-CM | POA: Diagnosis not present

## 2024-04-11 LAB — SURGICAL PATHOLOGY

## 2024-04-14 ENCOUNTER — Ambulatory Visit: Payer: Self-pay | Admitting: Internal Medicine

## 2024-04-18 DIAGNOSIS — E1165 Type 2 diabetes mellitus with hyperglycemia: Secondary | ICD-10-CM | POA: Diagnosis not present

## 2024-04-18 DIAGNOSIS — E11319 Type 2 diabetes mellitus with unspecified diabetic retinopathy without macular edema: Secondary | ICD-10-CM | POA: Diagnosis not present

## 2024-04-18 DIAGNOSIS — Z794 Long term (current) use of insulin: Secondary | ICD-10-CM | POA: Diagnosis not present

## 2024-04-18 DIAGNOSIS — E119 Type 2 diabetes mellitus without complications: Secondary | ICD-10-CM | POA: Diagnosis not present

## 2024-04-25 DIAGNOSIS — E11319 Type 2 diabetes mellitus with unspecified diabetic retinopathy without macular edema: Secondary | ICD-10-CM | POA: Diagnosis not present

## 2024-04-25 DIAGNOSIS — E119 Type 2 diabetes mellitus without complications: Secondary | ICD-10-CM | POA: Diagnosis not present

## 2024-04-25 DIAGNOSIS — E1165 Type 2 diabetes mellitus with hyperglycemia: Secondary | ICD-10-CM | POA: Diagnosis not present

## 2024-04-25 DIAGNOSIS — Z794 Long term (current) use of insulin: Secondary | ICD-10-CM | POA: Diagnosis not present

## 2024-05-02 DIAGNOSIS — E1165 Type 2 diabetes mellitus with hyperglycemia: Secondary | ICD-10-CM | POA: Diagnosis not present

## 2024-05-02 DIAGNOSIS — E119 Type 2 diabetes mellitus without complications: Secondary | ICD-10-CM | POA: Diagnosis not present

## 2024-05-02 DIAGNOSIS — Z794 Long term (current) use of insulin: Secondary | ICD-10-CM | POA: Diagnosis not present

## 2024-05-02 DIAGNOSIS — E11319 Type 2 diabetes mellitus with unspecified diabetic retinopathy without macular edema: Secondary | ICD-10-CM | POA: Diagnosis not present

## 2024-05-09 DIAGNOSIS — E119 Type 2 diabetes mellitus without complications: Secondary | ICD-10-CM | POA: Diagnosis not present

## 2024-05-09 DIAGNOSIS — E1165 Type 2 diabetes mellitus with hyperglycemia: Secondary | ICD-10-CM | POA: Diagnosis not present

## 2024-05-09 DIAGNOSIS — Z794 Long term (current) use of insulin: Secondary | ICD-10-CM | POA: Diagnosis not present

## 2024-05-09 DIAGNOSIS — E11319 Type 2 diabetes mellitus with unspecified diabetic retinopathy without macular edema: Secondary | ICD-10-CM | POA: Diagnosis not present

## 2024-05-16 DIAGNOSIS — E119 Type 2 diabetes mellitus without complications: Secondary | ICD-10-CM | POA: Diagnosis not present

## 2024-05-16 DIAGNOSIS — E11319 Type 2 diabetes mellitus with unspecified diabetic retinopathy without macular edema: Secondary | ICD-10-CM | POA: Diagnosis not present

## 2024-05-16 DIAGNOSIS — E1165 Type 2 diabetes mellitus with hyperglycemia: Secondary | ICD-10-CM | POA: Diagnosis not present

## 2024-05-16 DIAGNOSIS — Z794 Long term (current) use of insulin: Secondary | ICD-10-CM | POA: Diagnosis not present

## 2024-05-23 DIAGNOSIS — E11319 Type 2 diabetes mellitus with unspecified diabetic retinopathy without macular edema: Secondary | ICD-10-CM | POA: Diagnosis not present

## 2024-05-23 DIAGNOSIS — Z794 Long term (current) use of insulin: Secondary | ICD-10-CM | POA: Diagnosis not present

## 2024-05-23 DIAGNOSIS — E1165 Type 2 diabetes mellitus with hyperglycemia: Secondary | ICD-10-CM | POA: Diagnosis not present

## 2024-05-23 DIAGNOSIS — E119 Type 2 diabetes mellitus without complications: Secondary | ICD-10-CM | POA: Diagnosis not present

## 2024-05-30 DIAGNOSIS — E11319 Type 2 diabetes mellitus with unspecified diabetic retinopathy without macular edema: Secondary | ICD-10-CM | POA: Diagnosis not present

## 2024-05-30 DIAGNOSIS — Z794 Long term (current) use of insulin: Secondary | ICD-10-CM | POA: Diagnosis not present

## 2024-05-30 DIAGNOSIS — E1165 Type 2 diabetes mellitus with hyperglycemia: Secondary | ICD-10-CM | POA: Diagnosis not present

## 2024-05-30 DIAGNOSIS — E119 Type 2 diabetes mellitus without complications: Secondary | ICD-10-CM | POA: Diagnosis not present

## 2024-06-05 DIAGNOSIS — E1165 Type 2 diabetes mellitus with hyperglycemia: Secondary | ICD-10-CM | POA: Diagnosis not present

## 2024-06-05 DIAGNOSIS — N183 Chronic kidney disease, stage 3 unspecified: Secondary | ICD-10-CM | POA: Diagnosis not present

## 2024-06-05 DIAGNOSIS — I129 Hypertensive chronic kidney disease with stage 1 through stage 4 chronic kidney disease, or unspecified chronic kidney disease: Secondary | ICD-10-CM | POA: Diagnosis not present

## 2024-06-06 DIAGNOSIS — E1165 Type 2 diabetes mellitus with hyperglycemia: Secondary | ICD-10-CM | POA: Diagnosis not present

## 2024-06-06 DIAGNOSIS — Z794 Long term (current) use of insulin: Secondary | ICD-10-CM | POA: Diagnosis not present

## 2024-06-06 DIAGNOSIS — E11319 Type 2 diabetes mellitus with unspecified diabetic retinopathy without macular edema: Secondary | ICD-10-CM | POA: Diagnosis not present

## 2024-06-06 DIAGNOSIS — E119 Type 2 diabetes mellitus without complications: Secondary | ICD-10-CM | POA: Diagnosis not present

## 2024-06-10 DIAGNOSIS — H43821 Vitreomacular adhesion, right eye: Secondary | ICD-10-CM | POA: Diagnosis not present

## 2024-06-10 DIAGNOSIS — H35371 Puckering of macula, right eye: Secondary | ICD-10-CM | POA: Diagnosis not present

## 2024-06-10 DIAGNOSIS — H43811 Vitreous degeneration, right eye: Secondary | ICD-10-CM | POA: Diagnosis not present

## 2024-06-10 DIAGNOSIS — E113413 Type 2 diabetes mellitus with severe nonproliferative diabetic retinopathy with macular edema, bilateral: Secondary | ICD-10-CM | POA: Diagnosis not present

## 2024-06-13 DIAGNOSIS — E11319 Type 2 diabetes mellitus with unspecified diabetic retinopathy without macular edema: Secondary | ICD-10-CM | POA: Diagnosis not present

## 2024-06-13 DIAGNOSIS — E119 Type 2 diabetes mellitus without complications: Secondary | ICD-10-CM | POA: Diagnosis not present

## 2024-06-13 DIAGNOSIS — E1165 Type 2 diabetes mellitus with hyperglycemia: Secondary | ICD-10-CM | POA: Diagnosis not present

## 2024-06-13 DIAGNOSIS — Z794 Long term (current) use of insulin: Secondary | ICD-10-CM | POA: Diagnosis not present

## 2024-06-16 DIAGNOSIS — E119 Type 2 diabetes mellitus without complications: Secondary | ICD-10-CM | POA: Diagnosis not present

## 2024-06-16 DIAGNOSIS — Z794 Long term (current) use of insulin: Secondary | ICD-10-CM | POA: Diagnosis not present

## 2024-07-15 ENCOUNTER — Other Ambulatory Visit: Payer: Self-pay

## 2024-07-15 DIAGNOSIS — I480 Paroxysmal atrial fibrillation: Secondary | ICD-10-CM

## 2024-07-15 MED ORDER — APIXABAN 5 MG PO TABS
5.0000 mg | ORAL_TABLET | Freq: Two times a day (BID) | ORAL | 1 refills | Status: AC
Start: 2024-07-15 — End: ?

## 2024-07-15 NOTE — Telephone Encounter (Signed)
 Prescription refill request for Eliquis  received. Indication:aflutter Last office visit:4/25 Scr:1.26  10/24 Age: 78 Weight:101.6  kg  Prescription refilled

## 2024-07-16 DIAGNOSIS — E119 Type 2 diabetes mellitus without complications: Secondary | ICD-10-CM | POA: Diagnosis not present

## 2024-07-16 DIAGNOSIS — Z794 Long term (current) use of insulin: Secondary | ICD-10-CM | POA: Diagnosis not present

## 2024-07-17 DIAGNOSIS — K080 Exfoliation of teeth due to systemic causes: Secondary | ICD-10-CM | POA: Diagnosis not present

## 2024-08-15 DIAGNOSIS — E119 Type 2 diabetes mellitus without complications: Secondary | ICD-10-CM | POA: Diagnosis not present

## 2024-08-15 DIAGNOSIS — Z794 Long term (current) use of insulin: Secondary | ICD-10-CM | POA: Diagnosis not present

## 2024-08-25 DIAGNOSIS — Z794 Long term (current) use of insulin: Secondary | ICD-10-CM | POA: Diagnosis not present

## 2024-08-26 DIAGNOSIS — H43811 Vitreous degeneration, right eye: Secondary | ICD-10-CM | POA: Diagnosis not present

## 2024-08-26 DIAGNOSIS — H1789 Other corneal scars and opacities: Secondary | ICD-10-CM | POA: Diagnosis not present

## 2024-08-26 DIAGNOSIS — H43821 Vitreomacular adhesion, right eye: Secondary | ICD-10-CM | POA: Diagnosis not present

## 2024-08-26 DIAGNOSIS — E113413 Type 2 diabetes mellitus with severe nonproliferative diabetic retinopathy with macular edema, bilateral: Secondary | ICD-10-CM | POA: Diagnosis not present

## 2024-08-26 DIAGNOSIS — H35371 Puckering of macula, right eye: Secondary | ICD-10-CM | POA: Diagnosis not present

## 2024-09-09 DIAGNOSIS — E119 Type 2 diabetes mellitus without complications: Secondary | ICD-10-CM | POA: Diagnosis not present

## 2024-09-09 DIAGNOSIS — Z794 Long term (current) use of insulin: Secondary | ICD-10-CM | POA: Diagnosis not present

## 2024-09-10 DIAGNOSIS — I1310 Hypertensive heart and chronic kidney disease without heart failure, with stage 1 through stage 4 chronic kidney disease, or unspecified chronic kidney disease: Secondary | ICD-10-CM | POA: Diagnosis not present

## 2024-09-10 DIAGNOSIS — E1165 Type 2 diabetes mellitus with hyperglycemia: Secondary | ICD-10-CM | POA: Diagnosis not present
